# Patient Record
Sex: Female | Born: 1950
Health system: Southern US, Community
[De-identification: ages and names within clinical notes are randomized; demographics above are authoritative.]

## PROBLEM LIST (undated history)

## (undated) DIAGNOSIS — I10 Essential (primary) hypertension: Secondary | ICD-10-CM

## (undated) DIAGNOSIS — E669 Obesity, unspecified: Secondary | ICD-10-CM

## (undated) DIAGNOSIS — E119 Type 2 diabetes mellitus without complications: Secondary | ICD-10-CM

## (undated) DIAGNOSIS — H9190 Unspecified hearing loss, unspecified ear: Secondary | ICD-10-CM

## (undated) DIAGNOSIS — K219 Gastro-esophageal reflux disease without esophagitis: Secondary | ICD-10-CM

## (undated) DIAGNOSIS — G4730 Sleep apnea, unspecified: Secondary | ICD-10-CM

## (undated) DIAGNOSIS — K317 Polyp of stomach and duodenum: Secondary | ICD-10-CM

## (undated) DIAGNOSIS — D126 Benign neoplasm of colon, unspecified: Secondary | ICD-10-CM

## (undated) DIAGNOSIS — I2510 Atherosclerotic heart disease of native coronary artery without angina pectoris: Secondary | ICD-10-CM

## (undated) DIAGNOSIS — K648 Other hemorrhoids: Secondary | ICD-10-CM

## (undated) DIAGNOSIS — F32A Depression, unspecified: Secondary | ICD-10-CM

## (undated) DIAGNOSIS — D649 Anemia, unspecified: Secondary | ICD-10-CM

## (undated) DIAGNOSIS — K2270 Barrett's esophagus without dysplasia: Secondary | ICD-10-CM

## (undated) DIAGNOSIS — M4800 Spinal stenosis, site unspecified: Secondary | ICD-10-CM

## (undated) DIAGNOSIS — Z9289 Personal history of other medical treatment: Secondary | ICD-10-CM

## (undated) DIAGNOSIS — H269 Unspecified cataract: Secondary | ICD-10-CM

## (undated) DIAGNOSIS — I4891 Unspecified atrial fibrillation: Secondary | ICD-10-CM

## (undated) DIAGNOSIS — K635 Polyp of colon: Secondary | ICD-10-CM

## (undated) DIAGNOSIS — E785 Hyperlipidemia, unspecified: Secondary | ICD-10-CM

## (undated) DIAGNOSIS — T7840XA Allergy, unspecified, initial encounter: Secondary | ICD-10-CM

## (undated) DIAGNOSIS — K449 Diaphragmatic hernia without obstruction or gangrene: Secondary | ICD-10-CM

## (undated) DIAGNOSIS — K3189 Other diseases of stomach and duodenum: Secondary | ICD-10-CM

## (undated) DIAGNOSIS — K31A0 Gastric intestinal metaplasia, unspecified: Secondary | ICD-10-CM

## (undated) DIAGNOSIS — G4733 Obstructive sleep apnea (adult) (pediatric): Secondary | ICD-10-CM

## (undated) DIAGNOSIS — F419 Anxiety disorder, unspecified: Secondary | ICD-10-CM

## (undated) DIAGNOSIS — F329 Major depressive disorder, single episode, unspecified: Secondary | ICD-10-CM

## (undated) DIAGNOSIS — R7302 Impaired glucose tolerance (oral): Secondary | ICD-10-CM

## (undated) HISTORY — DX: Depression, unspecified: F32.A

## (undated) HISTORY — DX: Anemia, unspecified: D64.9

## (undated) HISTORY — DX: Impaired glucose tolerance (oral): R73.02

## (undated) HISTORY — DX: Unspecified cataract: H26.9

## (undated) HISTORY — DX: Unspecified atrial fibrillation: I48.91

## (undated) HISTORY — PX: TOTAL ABDOMINAL HYSTERECTOMY: SHX209

## (undated) HISTORY — DX: Anxiety disorder, unspecified: F41.9

## (undated) HISTORY — DX: Diaphragmatic hernia without obstruction or gangrene: K44.9

## (undated) HISTORY — DX: Allergy, unspecified, initial encounter: T78.40XA

## (undated) HISTORY — DX: Major depressive disorder, single episode, unspecified: F32.9

## (undated) HISTORY — DX: Obstructive sleep apnea (adult) (pediatric): G47.33

## (undated) HISTORY — DX: Polyp of colon: K63.5

## (undated) HISTORY — DX: Atherosclerotic heart disease of native coronary artery without angina pectoris: I25.10

## (undated) HISTORY — DX: Obesity, unspecified: E66.9

## (undated) HISTORY — DX: Hyperlipidemia, unspecified: E78.5

## (undated) HISTORY — DX: Personal history of other medical treatment: Z92.89

## (undated) HISTORY — DX: Gastro-esophageal reflux disease without esophagitis: K21.9

## (undated) HISTORY — DX: Polyp of stomach and duodenum: K31.7

## (undated) HISTORY — PX: CATARACT EXTRACTION: SUR2

## (undated) HISTORY — DX: Unspecified hearing loss, unspecified ear: H91.90

## (undated) HISTORY — DX: Other hemorrhoids: K64.8

## (undated) HISTORY — DX: Essential (primary) hypertension: I10

## (undated) HISTORY — DX: Type 2 diabetes mellitus without complications: E11.9

## (undated) HISTORY — DX: Benign neoplasm of colon, unspecified: D12.6

## (undated) HISTORY — DX: Gastric intestinal metaplasia, unspecified: K31.A0

## (undated) HISTORY — PX: GANGLION CYST EXCISION: SHX1691

## (undated) HISTORY — DX: Sleep apnea, unspecified: G47.30

---
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
## 1998-02-16 ENCOUNTER — Ambulatory Visit (HOSPITAL_COMMUNITY): Admission: RE | Admit: 1998-02-16 | Discharge: 1998-02-16 | Payer: Self-pay | Admitting: Internal Medicine

## 1998-08-23 ENCOUNTER — Other Ambulatory Visit: Admission: RE | Admit: 1998-08-23 | Discharge: 1998-08-23 | Payer: Self-pay | Admitting: Gynecology

## 1999-07-12 ENCOUNTER — Ambulatory Visit (HOSPITAL_COMMUNITY): Admission: RE | Admit: 1999-07-12 | Discharge: 1999-07-12 | Payer: Self-pay | Admitting: Obstetrics and Gynecology

## 1999-10-04 ENCOUNTER — Encounter: Payer: Self-pay | Admitting: Gynecology

## 1999-10-04 ENCOUNTER — Encounter: Admission: RE | Admit: 1999-10-04 | Discharge: 1999-10-04 | Payer: Self-pay | Admitting: Gynecology

## 1999-10-10 ENCOUNTER — Encounter: Admission: RE | Admit: 1999-10-10 | Discharge: 1999-10-10 | Payer: Self-pay | Admitting: Gynecology

## 1999-10-10 ENCOUNTER — Encounter: Payer: Self-pay | Admitting: Gynecology

## 1999-10-25 ENCOUNTER — Other Ambulatory Visit: Admission: RE | Admit: 1999-10-25 | Discharge: 1999-10-25 | Payer: Self-pay | Admitting: Gynecology

## 2000-10-16 ENCOUNTER — Encounter: Payer: Self-pay | Admitting: Gynecology

## 2000-10-16 ENCOUNTER — Ambulatory Visit (HOSPITAL_COMMUNITY): Admission: RE | Admit: 2000-10-16 | Discharge: 2000-10-16 | Payer: Self-pay | Admitting: Gynecology

## 2000-10-16 ENCOUNTER — Other Ambulatory Visit: Admission: RE | Admit: 2000-10-16 | Discharge: 2000-10-16 | Payer: Self-pay | Admitting: Gynecology

## 2001-05-27 ENCOUNTER — Encounter: Payer: Self-pay | Admitting: Emergency Medicine

## 2001-05-27 ENCOUNTER — Emergency Department (HOSPITAL_COMMUNITY): Admission: EM | Admit: 2001-05-27 | Discharge: 2001-05-27 | Payer: Self-pay | Admitting: Emergency Medicine

## 2001-05-31 ENCOUNTER — Encounter: Payer: Self-pay | Admitting: Internal Medicine

## 2001-05-31 ENCOUNTER — Encounter: Admission: RE | Admit: 2001-05-31 | Discharge: 2001-05-31 | Payer: Self-pay | Admitting: Internal Medicine

## 2001-10-18 ENCOUNTER — Other Ambulatory Visit: Admission: RE | Admit: 2001-10-18 | Discharge: 2001-10-18 | Payer: Self-pay | Admitting: Gynecology

## 2002-10-03 ENCOUNTER — Other Ambulatory Visit: Admission: RE | Admit: 2002-10-03 | Discharge: 2002-10-03 | Payer: Self-pay | Admitting: Gynecology

## 2003-01-24 ENCOUNTER — Encounter: Admission: RE | Admit: 2003-01-24 | Discharge: 2003-01-24 | Payer: Self-pay | Admitting: Internal Medicine

## 2003-01-24 ENCOUNTER — Encounter: Payer: Self-pay | Admitting: Internal Medicine

## 2005-07-30 ENCOUNTER — Ambulatory Visit (HOSPITAL_COMMUNITY): Admission: RE | Admit: 2005-07-30 | Discharge: 2005-07-30 | Payer: Self-pay | Admitting: Internal Medicine

## 2005-08-05 ENCOUNTER — Ambulatory Visit: Payer: Self-pay | Admitting: Cardiology

## 2005-08-07 ENCOUNTER — Inpatient Hospital Stay (HOSPITAL_BASED_OUTPATIENT_CLINIC_OR_DEPARTMENT_OTHER): Admission: RE | Admit: 2005-08-07 | Discharge: 2005-08-07 | Payer: Self-pay | Admitting: Cardiology

## 2005-08-07 ENCOUNTER — Ambulatory Visit: Payer: Self-pay | Admitting: Cardiology

## 2005-08-22 ENCOUNTER — Ambulatory Visit: Payer: Self-pay | Admitting: Cardiology

## 2006-02-24 ENCOUNTER — Encounter: Admission: RE | Admit: 2006-02-24 | Discharge: 2006-02-24 | Payer: Self-pay | Admitting: Internal Medicine

## 2007-05-12 ENCOUNTER — Other Ambulatory Visit: Admission: RE | Admit: 2007-05-12 | Discharge: 2007-05-12 | Payer: Self-pay | Admitting: Gynecology

## 2007-06-25 ENCOUNTER — Inpatient Hospital Stay (HOSPITAL_COMMUNITY): Admission: RE | Admit: 2007-06-25 | Discharge: 2007-06-27 | Payer: Self-pay | Admitting: Gynecology

## 2007-06-25 ENCOUNTER — Encounter: Payer: Self-pay | Admitting: Gynecology

## 2008-02-16 ENCOUNTER — Encounter: Admission: RE | Admit: 2008-02-16 | Discharge: 2008-02-16 | Payer: Self-pay | Admitting: Internal Medicine

## 2008-11-10 HISTORY — PX: WRIST FRACTURE SURGERY: SHX121

## 2008-11-13 ENCOUNTER — Emergency Department (HOSPITAL_COMMUNITY): Admission: EM | Admit: 2008-11-13 | Discharge: 2008-11-14 | Payer: Self-pay | Admitting: Emergency Medicine

## 2009-07-11 ENCOUNTER — Ambulatory Visit: Payer: Self-pay | Admitting: Cardiology

## 2009-07-11 ENCOUNTER — Inpatient Hospital Stay (HOSPITAL_COMMUNITY): Admission: EM | Admit: 2009-07-11 | Discharge: 2009-07-14 | Payer: Self-pay | Admitting: Emergency Medicine

## 2009-07-12 ENCOUNTER — Encounter (INDEPENDENT_AMBULATORY_CARE_PROVIDER_SITE_OTHER): Payer: Self-pay | Admitting: Emergency Medicine

## 2009-07-13 ENCOUNTER — Encounter: Payer: Self-pay | Admitting: Internal Medicine

## 2009-07-17 ENCOUNTER — Encounter: Payer: Self-pay | Admitting: Cardiology

## 2009-07-17 ENCOUNTER — Ambulatory Visit: Payer: Self-pay | Admitting: Internal Medicine

## 2009-07-17 LAB — CONVERTED CEMR LAB
INR: 3.6 — ABNORMAL HIGH (ref 0.8–1.0)
Prothrombin Time: 37.3 s — ABNORMAL HIGH (ref 9.1–11.7)

## 2009-07-24 ENCOUNTER — Ambulatory Visit: Payer: Self-pay | Admitting: Cardiology

## 2009-08-03 ENCOUNTER — Ambulatory Visit: Payer: Self-pay | Admitting: Internal Medicine

## 2009-08-03 DIAGNOSIS — G4733 Obstructive sleep apnea (adult) (pediatric): Secondary | ICD-10-CM

## 2009-08-03 DIAGNOSIS — I10 Essential (primary) hypertension: Secondary | ICD-10-CM

## 2009-08-03 DIAGNOSIS — K219 Gastro-esophageal reflux disease without esophagitis: Secondary | ICD-10-CM

## 2009-08-06 ENCOUNTER — Ambulatory Visit: Payer: Self-pay | Admitting: Internal Medicine

## 2009-08-06 DIAGNOSIS — E7800 Pure hypercholesterolemia, unspecified: Secondary | ICD-10-CM

## 2009-08-06 DIAGNOSIS — F17200 Nicotine dependence, unspecified, uncomplicated: Secondary | ICD-10-CM

## 2009-08-10 ENCOUNTER — Ambulatory Visit: Payer: Self-pay | Admitting: Internal Medicine

## 2009-08-28 LAB — CONVERTED CEMR LAB
ALT: 16 units/L (ref 0–35)
AST: 17 units/L (ref 0–37)
Alkaline Phosphatase: 91 units/L (ref 39–117)
Bilirubin, Direct: 0.1 mg/dL (ref 0.0–0.3)
Cholesterol: 184 mg/dL (ref 0–200)
Direct LDL: 117.4 mg/dL
HDL: 32.4 mg/dL — ABNORMAL LOW (ref >39.00)
Total Bilirubin: 0.7 mg/dL (ref 0.3–1.2)
Total CHOL/HDL Ratio: 6
Total Protein: 6.7 g/dL (ref 6.0–8.3)
Triglycerides: 244 mg/dL — ABNORMAL HIGH (ref 0.0–149.0)

## 2009-10-09 ENCOUNTER — Telehealth (INDEPENDENT_AMBULATORY_CARE_PROVIDER_SITE_OTHER): Payer: Self-pay | Admitting: *Deleted

## 2010-02-11 ENCOUNTER — Ambulatory Visit: Payer: Self-pay | Admitting: Internal Medicine

## 2010-02-11 DIAGNOSIS — I4891 Unspecified atrial fibrillation: Secondary | ICD-10-CM | POA: Insufficient documentation

## 2010-02-11 DIAGNOSIS — R0602 Shortness of breath: Secondary | ICD-10-CM

## 2010-03-04 ENCOUNTER — Telehealth: Payer: Self-pay | Admitting: Internal Medicine

## 2010-03-04 ENCOUNTER — Ambulatory Visit: Payer: Self-pay | Admitting: Internal Medicine

## 2010-03-05 ENCOUNTER — Encounter: Payer: Self-pay | Admitting: Internal Medicine

## 2010-03-05 LAB — CONVERTED CEMR LAB
BUN: 8 mg/dL (ref 6–23)
Creatinine, Ser: 0.7 mg/dL (ref 0.4–1.2)
GFR calc non Af Amer: 91.36 mL/min (ref >60)
Sodium: 139 meq/L (ref 135–145)

## 2010-04-02 ENCOUNTER — Encounter: Admission: RE | Admit: 2010-04-02 | Discharge: 2010-04-02 | Payer: Self-pay | Admitting: Internal Medicine

## 2010-05-03 ENCOUNTER — Ambulatory Visit: Payer: Self-pay | Admitting: Internal Medicine

## 2010-05-03 ENCOUNTER — Encounter: Payer: Self-pay | Admitting: Cardiology

## 2010-05-03 ENCOUNTER — Telehealth: Payer: Self-pay | Admitting: Internal Medicine

## 2010-05-22 ENCOUNTER — Ambulatory Visit: Payer: Self-pay | Admitting: Internal Medicine

## 2010-11-01 ENCOUNTER — Encounter: Payer: Self-pay | Admitting: Internal Medicine

## 2010-11-01 ENCOUNTER — Ambulatory Visit: Payer: Self-pay | Admitting: Internal Medicine

## 2010-11-07 ENCOUNTER — Telehealth: Payer: Self-pay | Admitting: Internal Medicine

## 2010-11-08 LAB — CONVERTED CEMR LAB
BUN: 16 mg/dL (ref 6–23)
CO2: 27 meq/L (ref 19–32)
Calcium: 8.9 mg/dL (ref 8.4–10.5)
Chloride: 102 meq/L (ref 96–112)
Glucose, Bld: 106 mg/dL — ABNORMAL HIGH (ref 70–99)
Sodium: 139 meq/L (ref 135–145)

## 2010-12-10 NOTE — Assessment & Plan Note (Signed)
Summary: f74m/jml   Visit Type:  Follow-up Primary Provider:  Shepard General MD   History of Present Illness: The patient presents today for routine electrophysiology followup. She reports doing well since last being seen in our office.  She has had no further palpitations.  Her BP remains elevated. She reports difficulty swallowing which is longstanding.  She has not seen Dr Chilton Si about this.   The patient denies any symptoms of chest pain,  orthopnea, PND, lower extremity edema, dizziness, presyncope, syncope, or neurologic sequela. The patient is tolerating medications without difficulties and is otherwise without complaint today.   Current Medications (verified): 1)  Bayer Aspirin 325 Mg Tabs (Aspirin) .... Take 1 Tablet By Mouth Once A Day 2)  Cardizem Cd 240 Mg Xr24h-Cap (Diltiazem Hcl Coated Beads) .Marland Kitchen.. 1 Once Daily 3)  Omeprazole 20 Mg Cpdr (Omeprazole) .... Two Times A Day 4)  Clonazepam 0.5 Mg Tabs (Clonazepam) .... One Tablet 3  Times A Day 5)  Simvastatin 40 Mg Tabs (Simvastatin) .... One By Mouth Qhs 6)  Hydrochlorothiazide 25 Mg Tabs (Hydrochlorothiazide) .... Take One Tablet Daily 7)  Fluoxetine Hcl 20 Mg Caps (Fluoxetine Hcl) .... Once Daily  Allergies: 1)  ! * Hydrocodone ?  Past History:  Past Medical History: Reviewed history from 08/06/2009 and no changes required. OSA compliant with CPAP Tobacco Use Persistent afib Obesity HL HTN GERD Cath 2006- nonobstructive CAD  Past Surgical History: Reviewed history from 08/06/2009 and no changes required. TAH C section  Social History: Reviewed history from 08/06/2009 and no changes required. She lives in [place] with her son.  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  She is a 15-pack-year smoker, negative for EtOH or drug use.   Review of Systems       All systems are reviewed and negative except as listed in the HPI.    Vital Signs:  Patient profile:   60 year old female Height:      66 inches Weight:      245 pounds BMI:     39.69 Pulse rate:   73 / minute BP sitting:   150 / 82  (left arm)  Vitals Entered By: Laurance Flatten CMA (May 22, 2010 11:17 AM)  Physical Exam  General:  obese, NAD Head:  normocephalic and atraumatic Eyes:  PERRLA/EOM intact; conjunctiva and lids normal. Mouth:  Teeth, gums and palate normal. Oral mucosa normal. Neck:  Neck supple, no JVD. No masses, thyromegaly or abnormal cervical nodes. Lungs:  Clear bilaterally to auscultation and percussion. Heart:  Non-displaced PMI, chest non-tender; regular rate and rhythm, S1, S2 without murmurs, rubs or gallops. Carotid upstroke normal, no bruit. Normal abdominal aortic size, no bruits. Femorals normal pulses, no bruits. Pedals normal pulses. No edema, no varicosities. Abdomen:  Bowel sounds positive; abdomen soft and non-tender without masses, organomegaly, or hernias noted. No hepatosplenomegaly. Msk:  Back normal, normal gait. Muscle strength and tone normal. Pulses:  pulses normal in all 4 extremities Extremities:  No clubbing or cyanosis. Neurologic:  Alert and oriented x 3. Skin:  Intact without lesions or rashes.   Impression & Recommendations:  Problem # 1:  ATRIAL FIBRILLATION (ICD-427.31)  We will continue cardizem for rate control.  We may need to add an antiarrhythmic if she has more afib. Her CHADS2 score is 1.  I have recommended either pradaxa or coumadin for stroke prevention, however, the patient  is clear in her decision to continue ASA 325mg  daily at this time.  Her updated medication list for this problem includes:    Bayer Aspirin 325 Mg Tabs (Aspirin) .Marland Kitchen... Take 1 tablet by mouth once a day  Problem # 2:  TOBACCO ABUSE (ICD-305.1) she quit smoking recently recent PFTs reveal mild obstruction cessation again encouraged  Problem # 3:  HYPERTENSION, UNSPECIFIED (ICD-401.9)  Her updated medication  list for this problem includes:    Bayer Aspirin 325 Mg Tabs (Aspirin) .Marland Kitchen... Take 1 tablet by mouth once a day    Cardizem Cd 240 Mg Xr24h-cap (Diltiazem hcl coated beads) .Marland Kitchen... 1 once daily    Hydrochlorothiazide 25 Mg Tabs (Hydrochlorothiazide) .Marland Kitchen... Take one tablet daily    Lisinopril 5 Mg Tabs (Lisinopril) ..... One by mouth once daily  Her updated medication list for this problem includes:    Bayer Aspirin 325 Mg Tabs (Aspirin) .Marland Kitchen... Take 1 tablet by mouth once a day    Cardizem Cd 240 Mg Xr24h-cap (Diltiazem hcl coated beads) .Marland Kitchen... 1 once daily    Hydrochlorothiazide 25 Mg Tabs (Hydrochlorothiazide) .Marland Kitchen... Take one tablet daily  Patient Instructions: 1)  Your physician recommends that you schedule a follow-up appointment in: 4 months with Dr Johney Frame 2)  Your physician has recommended you make the following change in your medication: start Lisinopril 5mg  daily  3)  Watch the Salt in diet  4)  Your physician recommends that you return for lab work in: 6 weeks with Dr Chilton Si (BMP) Prescriptions: LISINOPRIL 5 MG TABS (LISINOPRIL) one by mouth once daily  #30 x 11   Entered by:   Dennis Bast, RN, BSN   Authorized by:   Hillis Range, MD   Signed by:   Dennis Bast, RN, BSN on 05/22/2010   Method used:   [facility]        [facility]  [address] [facility])       [address]       [place], [place]  [address]       Ph: [phone] or [phone]       Fax: [phone]   RxID:   [id number]   Appended Document: f21m/jml Due to FDAs new advisory, we will decrease simvastatin to 10mg  daily as she is also on cardizem. She will need fasting lipids upon return.

## 2010-12-10 NOTE — Assessment & Plan Note (Signed)
Summary: decrease Simvastatin 10mg     Patient Instructions: 1)  Your physician recommends that you schedule a follow-up appointment in: 4 months with Dr Johney Frame 2)  Your physician has recommended you make the following change in your medication: start Lisinopril 5mg  daily and decrease Simvastatin to 10mg  daily 3)  Watch the Salt in diet 4)  Your physician recommends that you return for lab work in: 6 weeks with Dr Chilton Si (BMP)

## 2010-12-10 NOTE — Medication Information (Signed)
Summary: Work Note  Work Note   Imported By: Marylou Mccoy 05/24/2010 11:49:19  _____________________________________________________________________  External Attachment:    Type:   Image     Comment:   External Document

## 2010-12-10 NOTE — Progress Notes (Signed)
Summary: b/p issues, out of rhythm  Phone Note Call from Patient Call back at [facility] Phone [phone] Call back at .  ext 290 or 225 after 4 p.m   Caller: Patient Reason for Call: Talk to Nurse Summary of Call: per pt calling, c/o b/p today 168/131 hr 108. no chestpain, no sob. pt thinks she is out of rhythm.  Initial call taken by: Lorne Skeens,  May 03, 2010 3:11 PM  Follow-up for Phone Call        05/03/10 1530pm--pt states thinks she's gone back into a fib--no increase in SOB--no CP---dr Korrina Zern notified and wants pt  to come in for EKG--pt aware and will come in today--nt 05/03/10--1635pm--pt here for EKG to check for a fib--EKG today shows NSR with incomplete BBB--pt states her BP at home was 168/131--i got 156/62 with heart rate at 95bpm--she states she thinks her BP machine must be broken--no SOB or CP noted--note given for work tardiness,pt reassured and told to f/u on her sched appoint in mid july with dr Sayed Apostol--dr Tremel Setters aware of all the above--nt Follow-up by: Ledon Snare, RN,  May 03, 2010 4:42 PM

## 2010-12-10 NOTE — Assessment & Plan Note (Signed)
Summary: per check out/sf   Visit Type:  Follow-up Primary Provider:  Shepard General MD   History of Present Illness: The patient presents today for routine electrophysiology followup. She reports doing well since her recent hospital discharge. She has quit smoking since last being seen in our clinic.  She continues to have diffiuculty with SOB.  She also reports occasional palpitations and feels that she may have had 3 episodes of afib since last being seen in our clinic, lasting several hours each.  The patient denies any symptoms of chest pain,  orthopnea, PND, lower extremity edema, dizziness, presyncope, syncope, or neurologic sequela. The patient is tolerating medications without difficulties and is otherwise without complaint today.   Current Medications (verified): 1)  Bayer Aspirin 325 Mg Tabs (Aspirin) .... Take 1 Tablet By Mouth Once A Day 2)  Cardizem Cd 240 Mg Xr24h-Cap (Diltiazem Hcl Coated Beads) .Marland Kitchen.. 1 Once Daily 3)  Omeprazole 20 Mg Cpdr (Omeprazole) .Marland Kitchen.. 1 Once Daily 4)  Clonazepam 0.5 Mg Tabs (Clonazepam) .... One Tablet Two Times A Day 5)  Simvastatin 20 Mg Tabs (Simvastatin) .... Take One Tablet By Mouth Daily At Bedtime  Allergies: 1)  ! * Hydrocodone ?  Past History:  Past Medical History: Reviewed history from 08/06/2009 and no changes required. OSA compliant with CPAP Tobacco Use Persistent afib Obesity HL HTN GERD Cath 2006- nonobstructive CAD  Past Surgical History: Reviewed history from 08/06/2009 and no changes required. TAH C section  Social History: Reviewed history from 08/06/2009 and no changes required. She lives in [place] with her son.  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  She is a 15-pack-year smoker, negative for EtOH or drug use.   Review of Systems       All systems are reviewed and negative except as listed in the HPI.   Vital  Signs:  Patient profile:   60 year old female Height:      66 inches Weight:      238 pounds BMI:     38.55 Pulse rate:   80 / minute BP sitting:   142 / 70  Vitals Entered By: Laurance Flatten CMA (February 11, 2010 4:02 PM)  Physical Exam  General:  Well developed, well nourished, in no acute distress. Head:  normocephalic and atraumatic Eyes:  PERRLA/EOM intact; conjunctiva and lids normal. Mouth:  Teeth, gums and palate normal. Oral mucosa normal. Neck:  Neck supple, no JVD. No masses, thyromegaly or abnormal cervical nodes. Lungs:  Clear bilaterally to auscultation and percussion. Heart:  Non-displaced PMI, chest non-tender; regular rate and rhythm, S1, S2 without murmurs, rubs or gallops. Carotid upstroke normal, no bruit. Normal abdominal aortic size, no bruits. Femorals normal pulses, no bruits. Pedals normal pulses. No edema, no varicosities. Abdomen:  Bowel sounds positive; abdomen soft and non-tender without masses, organomegaly, or hernias noted. No hepatosplenomegaly. Msk:  Back normal, normal gait. Muscle strength and tone normal. Pulses:  pulses normal in all 4 extremities Extremities:  No clubbing or cyanosis. Neurologic:  Alert and oriented x 3.  CNII-XII intact, strength/sensation are intact Skin:  Intact without lesions or rashes.   EKG  Procedure date:  02/11/2010  Findings:      sinus rhythm 80 bpm, Incomplete RBBB, nonspecific ST/T changes  Impression & Recommendations:  Problem # 1:  SHORTNESS OF BREATH (ICD-786.05) stable without CHF by exam we will check PFTs  Problem # 2:  TOBACCO ABUSE (ICD-305.1) cessation again encouraged  Problem # 3:  ATRIAL FIBRILLATION (ICD-427.31) We will continue cardizem for rate control.  We may need to add an antiarrhythmic if she has more afib. Her CHADS2 score is 1.  I have recommended either pradaxa or coumadin for stroke prevention, however, the patient is clear in her decision to continue ASA 325mg  daily at this  time.  Problem # 4:  HYPERTENSION, UNSPECIFIED (ICD-401.9) above goal check BMET add HCTZ 25mg  daily today  Problem # 5:  HYPERCHOLESTEROLEMIA (ICD-272.0) recent elevated TGs. I have recommended weight loss and diet modification. WE will increase simvastatin to 40mg  daily today consider gemfibrazil if TGs remain elevated.  Other Orders: EKG w/ Interpretation (93000) TLB-BMP (Basic Metabolic Panel-BMET) (80048-METABOL) Misc. Referral (Misc. Ref)  Patient Instructions: 1)  Your physician recommends that you schedule a follow-up appointment in: 3 months 2)  Your physician recommends that you haver lab work today: bmet 3)  Your physician has recommended that you have a pulmonary function test.  Pulmonary Function Tests are a group of tests that measure how well air moves in and out of your lungs. 4)  Your physician has recommended you make the following change in your medication: simvastatin 40mg  a day.  Start HCTZ 25mg  daily Prescriptions: SIMVASTATIN 40 MG TABS (SIMVASTATIN) one by mouth qhs  #30 x 6   Entered by:   Lisabeth Devoid RN   Authorized by:   Hillis Range, MD   Signed by:   Lisabeth Devoid RN on 02/11/2010   Method used:   [facility]        [facility] [facility]       [address]       [place], [place]  [address]       Ph: [phone] or [phone]       Fax: [phone]   RxID:   [id number] CARDIZEM CD 240 MG XR24H-CAP (DILTIAZEM HCL COATED BEADS) 1 once daily  #30 x 6   Entered by:   Lisabeth Devoid RN   Authorized by:   Hillis Range, MD   Signed by:   Lisabeth Devoid RN on 02/11/2010   Method used:   [facility]        [facility] [facility]       [address]       [place], [place]  [address]       Ph: [phone] or [phone]       Fax: [phone]   RxID:   [id number] HYDROCHLOROTHIAZIDE 25 MG TABS (HYDROCHLOROTHIAZIDE) take one tablet daily  #30 x 6   Entered by:   Lisabeth Devoid RN   Authorized by:   Hillis Range, MD   Signed by:   Lisabeth Devoid RN on 02/11/2010   Method used:   [facility]        [facility]  [address] ([facility])       [address]       [place], [place]  [address]       Ph: [phone] or [phone]       Fax: [phone]   RxID:   [id number] SIMVASTATIN 40 MG TABS (SIMVASTATIN)   #30 x 6   Entered by:   Lisabeth Devoid RN   Authorized by:   Hillis Range, MD   Signed by:   Lisabeth Devoid RN on 02/11/2010   Method used:   Print then Give to Patient   RxID:  [id number]  

## 2010-12-10 NOTE — Progress Notes (Signed)
Summary: test results  Phone Note Call from Patient Call back at Home Phone [phone]   Caller: Patient Reason for Call: Talk to Nurse, Lab or Test Results Initial call taken by: Lorne Skeens,  March 04, 2010 12:19 PM  Follow-up for Phone Call        tried to call twice  no answer Dennis Bast, RN, BSN  March 04, 2010 3:03 PM pt aware  Dennis Bast, RN, BSN  March 05, 2010 10:24 AM    f

## 2010-12-10 NOTE — Miscellaneous (Signed)
Summary: Orders Update pft charges  Clinical Lists Changes  Orders: Added new Service order of Carbon Monoxide diffusing w/capacity (94720) - Signed Added new Service order of Lung Volumes (94240) - Signed Added new Service order of Spirometry (Pre & Post) (94060) - Signed 

## 2010-12-12 NOTE — Progress Notes (Signed)
Summary: returning call from yesterday  Phone Note Call from Patient Call back at Home Phone [phone]   Caller: Patient Reason for Call: Talk to Nurse Details for Reason: returning called from [facility] on yesterday. Summary of Call: tried to back  No answer Dennis Bast, RN, BSN  November 07, 2010 3:43 PM Initial call taken by: Lorne Skeens,  November 07, 2010 12:50 PM  Follow-up for Phone Call        adv pt of lab results.  Follow-up by: Claris Gladden RN,  November 08, 2010 9:46 AM

## 2010-12-12 NOTE — Assessment & Plan Note (Signed)
Summary: 4 month rov.sl   Visit Type:  Follow-up Primary Provider:  Shepard General MD   History of Present Illness: The patient presents today for routine electrophysiology followup. She reports doing well since last being seen in our office.  She has had no further palpitations and feels that her afib is controlled.  Her BP has improved.   She denies further symptoms of dysphagia.  She has not seen Dr Chilton Si about this.   The patient denies any symptoms of chest pain,  orthopnea, PND, lower extremity edema, dizziness, presyncope, syncope, or neurologic sequela. The patient is tolerating medications without difficulties and is otherwise without complaint today.   Current Medications (verified): 1)  Bayer Aspirin 325 Mg Tabs (Aspirin) .... Take 1 Tablet By Mouth Once A Day 2)  Cardizem Cd 240 Mg Xr24h-Cap (Diltiazem Hcl Coated Beads) .Marland Kitchen.. 1 Once Daily 3)  Omeprazole 20 Mg Cpdr (Omeprazole) .... Two Times A Day 4)  Clonazepam 0.5 Mg Tabs (Clonazepam) .... One Tablet 3  Times A Day 5)  Simvastatin 10 Mg Tabs (Simvastatin) .... One By Mouth Q Hs 6)  Hydrochlorothiazide 25 Mg Tabs (Hydrochlorothiazide) .... Take One Tablet Daily 7)  Fluoxetine Hcl 20 Mg Caps (Fluoxetine Hcl) .... Once Daily 8)  Lisinopril 5 Mg Tabs (Lisinopril) .... One By Mouth Once Daily 9)  Metformin Hcl 500 Mg Tabs (Metformin Hcl) .... Once Daily  Allergies: 1)  ! * Hydrocodone ?  Past History:  Past Medical History: Reviewed history from 08/06/2009 and no changes required. OSA compliant with CPAP Tobacco Use Persistent afib Obesity HL HTN GERD Cath 2006- nonobstructive CAD  Past Surgical History: Reviewed history from 08/06/2009 and no changes required. TAH C section  Social History: Reviewed history from 08/06/2009 and no changes required. She lives in [place] with her son.  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  She  is a 15-pack-year smoker, negative for EtOH or drug use.   Review of Systems       All systems are reviewed and negative except as listed in the HPI.   Vital Signs:  Patient profile:   60 year old female Height:      66 inches Weight:      224 pounds BMI:     36.29 Pulse rate:   57 / minute BP sitting:   110 / 70  (left arm)  Vitals Entered By: Laurance Flatten CMA (November 01, 2010 11:49 AM)  Physical Exam  General:  obese, NAD Head:  normocephalic and atraumatic Eyes:  PERRLA/EOM intact; conjunctiva and lids normal. Mouth:  Teeth, gums and palate normal. Oral mucosa normal. Neck:  Neck supple, no JVD. No masses, thyromegaly or abnormal cervical nodes. Lungs:  Clear bilaterally to auscultation and percussion. Heart:  Non-displaced PMI, chest non-tender; regular rate and rhythm, S1, S2 without murmurs, rubs or gallops. Carotid upstroke normal, no bruit. Normal abdominal aortic size, no bruits. Femorals normal pulses, no bruits. Pedals normal pulses. No edema, no varicosities. Abdomen:  Bowel sounds positive; abdomen soft and non-tender without masses, organomegaly, or hernias noted. No hepatosplenomegaly. Msk:  Back normal, normal gait. Muscle strength and tone normal. Extremities:  No clubbing or cyanosis. Neurologic:  Alert and oriented x 3.   EKG  Procedure date:  11/01/2010  Findings:      sinus bradycardia 57 bpm, PR 192, Qtc 461, incomplete RBBB, otherwise normal ekg  Impression & Recommendations:  Problem # 1:  ATRIAL FIBRILLATION (ICD-427.31) We will continue cardizem for rate control.  We may need to add an antiarrhythmic if she has more afib. Her CHADS2 score is 1.  I have recommended either xarelto or coumadin for stroke prevention, however, the patient is clear in her decision to continue ASA 325mg  daily at this time.  She has accepted a pamplet on xarelto today and will consider starting this medicine.  I have suggested xarelto over pradaxa due to her prior GI  difficulty (dysphagia/ dyspepsia) which may be further exacerbated by pradaxa. She will contact my office if she wishes to begin xarelto.  Her updated medication list for this problem includes:    Bayer Aspirin 325 Mg Tabs (Aspirin) .Marland Kitchen... Take 1 tablet by mouth once a day  Problem # 2:  TOBACCO ABUSE (ICD-305.1) cessation encouraged  Problem # 3:  HYPERTENSION, UNSPECIFIED (ICD-401.9) improved we will obtain a BMET  Problem # 4:  GERD (ICD-530.81) improved Her updated medication list for this problem includes:    Omeprazole 20 Mg Cpdr (Omeprazole) .Marland Kitchen..Marland Kitchen Two times a day  Problem # 5:  SHORTNESS OF BREATH (ICD-786.05) improved no changes  Problem # 6:  HYPERCHOLESTEROLEMIA (ICD-272.0) stable Her updated medication list for this problem includes:    Simvastatin 10 Mg Tabs (Simvastatin) ..... One by mouth q hs  Other Orders: T-Basic Metabolic Panel [phone])  Patient Instructions: 1)  Your physician wants you to follow-up in:  6 months with Dr Johney Frame Bonita Quin will receive a reminder letter in the mail two months in advance. If you don't receive a letter, please call our office to schedule the follow-up appointment. 2)  read about Xarelto

## 2011-02-14 LAB — CBC
HCT: 38.4 % (ref 36.0–46.0)
HCT: 41.2 % (ref 36.0–46.0)
HCT: 43.3 % (ref 36.0–46.0)
Hemoglobin: 13.8 g/dL (ref 12.0–15.0)
Hemoglobin: 14.8 g/dL (ref 12.0–15.0)
MCHC: 33.6 g/dL (ref 30.0–36.0)
MCHC: 34.2 g/dL (ref 30.0–36.0)
MCV: 91.8 fL (ref 78.0–100.0)
MCV: 92.5 fL (ref 78.0–100.0)
MCV: 93 fL (ref 78.0–100.0)
Platelets: 285 10*3/uL (ref 150–400)
Platelets: 293 10*3/uL (ref 150–400)
Platelets: 295 10*3/uL (ref 150–400)
RBC: 4.43 MIL/uL (ref 3.87–5.11)
RBC: 4.72 MIL/uL (ref 3.87–5.11)
RDW: 14 % (ref 11.5–15.5)
RDW: 14.3 % (ref 11.5–15.5)
WBC: 11 10*3/uL — ABNORMAL HIGH (ref 4.0–10.5)
WBC: 9.2 10*3/uL (ref 4.0–10.5)

## 2011-02-14 LAB — URINE MICROSCOPIC-ADD ON

## 2011-02-14 LAB — PROTIME-INR
INR: 0.9 (ref 0.00–1.49)
INR: 1 (ref 0.00–1.49)
INR: 1.5 (ref 0.00–1.49)
Prothrombin Time: 17.5 seconds — ABNORMAL HIGH (ref 11.6–15.2)
Prothrombin Time: 25 seconds — ABNORMAL HIGH (ref 11.6–15.2)

## 2011-02-14 LAB — BRAIN NATRIURETIC PEPTIDE: Pro B Natriuretic peptide (BNP): 102 pg/mL — ABNORMAL HIGH (ref 0.0–100.0)

## 2011-02-14 LAB — COMPREHENSIVE METABOLIC PANEL
AST: 20 U/L (ref 0–37)
Alkaline Phosphatase: 75 U/L (ref 39–117)
BUN: 12 mg/dL (ref 6–23)
CO2: 25 mEq/L (ref 19–32)
Calcium: 8.7 mg/dL (ref 8.4–10.5)
Chloride: 108 mEq/L (ref 96–112)
Creatinine, Ser: 0.83 mg/dL (ref 0.4–1.2)
GFR calc Af Amer: >60 mL/min (ref >60)
GFR calc non Af Amer: >60 mL/min (ref >60)
Sodium: 141 mEq/L (ref 135–145)

## 2011-02-14 LAB — CK TOTAL AND CKMB (NOT AT ARMC)
CK, MB: 0.6 ng/mL (ref 0.3–4.0)
Total CK: 20 U/L (ref 7–177)

## 2011-02-14 LAB — URINALYSIS, ROUTINE W REFLEX MICROSCOPIC
Nitrite: NEGATIVE
Urobilinogen, UA: 1 mg/dL (ref 0.0–1.0)
pH: 6.5 (ref 5.0–8.0)

## 2011-02-14 LAB — CARDIAC PANEL(CRET KIN+CKTOT+MB+TROPI)
CK, MB: 0.6 ng/mL (ref 0.3–4.0)
Total CK: 12 U/L (ref 7–177)

## 2011-02-14 LAB — DIFFERENTIAL
Basophils Relative: 0 % (ref 0–1)
Eosinophils Absolute: 0.3 10*3/uL (ref 0.0–0.7)
Eosinophils Relative: 3 % (ref 0–5)
Lymphs Abs: 2.7 10*3/uL (ref 0.7–4.0)
Monocytes Absolute: 0.6 10*3/uL (ref 0.1–1.0)
Neutro Abs: 7.1 10*3/uL (ref 1.7–7.7)
Neutrophils Relative %: 66 % (ref 43–77)

## 2011-02-14 LAB — POCT CARDIAC MARKERS
Myoglobin, poc: 37.9 ng/mL (ref 12–200)
Troponin i, poc: <0.05 ng/mL (ref 0.00–0.09)
Troponin i, poc: <0.05 ng/mL (ref 0.00–0.09)

## 2011-02-14 LAB — LIPID PANEL
HDL: 27 mg/dL — ABNORMAL LOW (ref >39)
LDL Cholesterol: 125 mg/dL — ABNORMAL HIGH (ref 0–99)
Total CHOL/HDL Ratio: 7.4 RATIO
Triglycerides: 233 mg/dL — ABNORMAL HIGH (ref <150)
VLDL: 47 mg/dL — ABNORMAL HIGH (ref 0–40)

## 2011-02-14 LAB — HEMOGLOBIN A1C: Mean Plasma Glucose: 111 mg/dL

## 2011-02-14 LAB — TSH: TSH: 1.087 u[IU]/mL (ref 0.350–4.500)

## 2011-02-14 LAB — TROPONIN I: Troponin I: 0.01 ng/mL (ref 0.00–0.06)

## 2011-02-14 LAB — URINE CULTURE: Colony Count: >=100000

## 2011-02-14 LAB — HEPARIN LEVEL (UNFRACTIONATED): Heparin Unfractionated: 1.13 IU/mL — ABNORMAL HIGH (ref 0.30–0.70)

## 2011-02-14 LAB — D-DIMER, QUANTITATIVE: D-Dimer, Quant: 0.3 ug/mL-FEU (ref 0.00–0.48)

## 2011-03-03 ENCOUNTER — Other Ambulatory Visit: Payer: Self-pay | Admitting: Internal Medicine

## 2011-03-08 ENCOUNTER — Other Ambulatory Visit: Payer: Self-pay | Admitting: Internal Medicine

## 2011-03-08 DIAGNOSIS — I4891 Unspecified atrial fibrillation: Secondary | ICD-10-CM

## 2011-03-08 DIAGNOSIS — I10 Essential (primary) hypertension: Secondary | ICD-10-CM

## 2011-03-25 NOTE — H&P (Signed)
Kayla Hendrix, Kayla Hendrix               ACCOUNT NO.:  [id number]   MEDICAL RECORD NO.:  [id number]          PATIENT TYPE:  INP   LOCATION:  2013                         FACILITY:  [facility]   PHYSICIAN:  Madolyn Frieze. Jens Som, MD, FACCDATE OF BIRTH:  [birth year]   DATE OF ADMISSION:  07/11/2009  DATE OF DISCHARGE:                              HISTORY & PHYSICAL   PRIMARY CARDIOLOGIST:  Arturo Morton. Riley Kill, MD, [facility] in remote past.   PRIMARY CARE PHYSICIAN:  Erskine Speed, MD   HISTORY OF PRESENT ILLNESS:  A 60 year old Caucasian female with history  of paroxysmal atrial fibrillation in the remote past who has not been  feeling well for the last 3 days.  She just had a tooth extraction  approximately 5 days ago and is under a lot of stress at work and she  felt that this was related to her symptoms.  She took her blood pressure  at home today and noticed that her heart rate is 130 and it was  irregular.  She called the primary care physician, Dr. Elmore Guise whose  office was closed this afternoon and she called our office who advised  her to come to the emergency room.  The patient was found to be in AFib  with RVR with heart rate of 140 beats per minute.  She was placed on  Cardizem drip at 10 mg an hour after 10 mg bolus.  The patient's heart  rate remains in AFib in 112-115 range.  She denies chest pain or  shortness of breath.  The patient cannot tell that she is in atrial  fibrillation.  At this time, her only complaints are fatigue.  The  patient has not been followed by Cardiology in several years.  She is  mostly followed by primary care physician and is on Cartia for  hypertension.   REVIEW OF SYSTEMS:  Positive for fatigue and irregular rhythm.  She  denies chest pain, shortness of breath, nausea, or vomiting.  All other  systems had been reviewed and found to be negative other than those  listed above.   PAST MEDICAL HISTORY:  1. Paroxysmal atrial fibrillation with a history of [facility]  cardioversion      and short-term Coumadin therapy, has not been followed by      Cardiology since that time.  2. Hypertension.  3. GERD.  4. Sleep apnea, but does not use CPAP.  The patient did undergo      cardiac catheterization in September 2006, secondary to abnormal      stress test, which revealed a calcification of the mid LAD without      critical stenosis, scattered luminal irregularities elsewhere.   PAST SURGICAL HISTORY:  Hysterectomy and a C-section.   SOCIAL HISTORY:  She lives in [place] with her son.  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 to 12:00 a.m. shift.  She is a 15-pack-year smoker,  negative for EtOH or drug use.   FAMILY HISTORY:  Non-insulin-dependent diabetes, hypertension.  Father  with cancer  and both parents are deceased.   CURRENT MEDICATIONS:  Prior to admission, Cartia XT 180 mg daily,  omeprazole 40 mg daily, clonazepam daily, aspirin 81 mg daily.   ALLERGIES:  No known drug allergies.   CURRENT LABORATORY DATA:  Sodium 141, potassium 3.6, chloride 108, CO2  of 25, BUN 12, creatinine 0.83, glucose 136, hemoglobin 14.8, hematocrit  43.3, white blood cells 10.7, platelets 295.  Sodium 141, potassium 3.6,  chloride 108, CO2 of 25, BUN 12, creatinine 0.83, glucose 136, troponin  less than 0.05 for point of care.  PTT 28, PT 12.2, INR 0.9.  EKG  revealing AFib with RVR rate of 142 beats per minute.  Chest x-ray  revealing low lung volumes with borderline cardiomegaly.   PHYSICAL EXAMINATION:  VITAL SIGNS:  Blood pressure 122/72, pulse 115,  respirations 18, temperature 98.1, O2 sat 99% on room air.  GENERAL:  She is awake, alert, oriented.  She does have a flat affect.  HEENT:  Head is normocephalic and atraumatic.  Eyes, PERRLA.  Mucous  membranes, mouth pink and moist.  Tongue is midline.  NECK:  Supple without bruits, there is some mild JVD noted.  CARDIOVASCULAR:  Irregular  rhythm, tachycardic without murmurs, rubs, or  gallops.  Pulses are 2+ and equal without bruits.  LUNGS:  Some scattered crackles, bibasilar, but she does have poor  inspiratory effort.  SKIN:  Warm and dry.  ABDOMEN:  Soft, nontender with 2+ bowel sounds.  EXTREMITIES:  Without clubbing, cyanosis, or edema.  NEUROLOGIC:  Cranial nerves II through XII are grossly intact.   IMPRESSION:  Atrial fibrillation, rapid ventricular response, unknown  how long she has had this rhythm.  Currently, on Cardizem drip.  We will  start heparin.  Check echocardiogram for LV function, add aspirin, and  need to evaluate further.  We will cycle cardiac enzymes, check TSH, D-  dimer, and smoking cessation is recommended.  The patient will be  started on p.o. Coumadin with consideration for [facility] cardioversion in a  later date at the discretion of Dr. Jens Som.  The patient's [place] score  is 1 if she converts on her own.  The patient will be continued on  aspirin only.  We will follow the patient throughout hospitalization  making further recommendations depending upon the patient's response to  his treatment.      Bettey Mare. Lyman Bishop, NP      Madolyn Frieze. Jens Som, MD, [facility]  Electronically Signed    KML/MEDQ  D:  07/11/2009  T:  07/12/2009  Job:  [id number]

## 2011-03-25 NOTE — H&P (Signed)
NAME:  Kayla Hendrix, Kayla Hendrix               ACCOUNT NO.:  [id number]   MEDICAL RECORD NO.:  [id number]          PATIENT TYPE:  AMB   LOCATION:  SDC                           [facility]ACILITY:  [facility]   PHYSICIAN:  Juan H. Lily Peer, M.D.DATE OF BIRTH:  03-Dec-1950   DATE OF ADMISSION:  DATE OF DISCHARGE:                              HISTORY & PHYSICAL   Patient scheduled for surgery on Friday, August 15, at 7:30 a.m. at  [facility].  Please have history and physical available.   CHIEF COMPLAINT:  Symptomatic leiomyomatous uteri.   HISTORY:  The patient is a 60 year old gravida 1, para 1, with history  of symptomatic leiomyomatous uteri and postmenopausal bleeding.  The  patient had been followed by Dr. Shepard General, who is her internist, who  recently had completed her medical evaluation and had cleared her for  her scheduled total abdominal hysterectomy and BSO, which is scheduled  for June 25, 2007.  Her recent Pap smear was normal.  She had  undergone through an endometrial biopsy, due to her postmenopausal  bleeding on July 2, with a benign findings.  The ultrasound done on July  2 demonstrated a retroverted uterus.  Endometrial cavity was displaced  by a calcified myoma, measured 68 x 82 x 87 mm.  Right and left ovaries  were normal.  Sonohystogram demonstrated endometrial cavity was filled  at the lower uterine segment, due to the pressure from the anterior  myoma against the wall of the cavity.   PAST MEDICAL HISTORY:  One c-section.  She has had a history of  hypertension and gastroesophageal reflux.   ALLERGIES:  SHE DENIES ANY ALLERGIES.   MEDICATIONS:  She takes aspirin, metoprolol, Prilosec, Cardia, and  Klonopin.   SOCIAL HISTORY:  She smokes half a pack of cigarettes per day.   PAST SURGICAL HISTORY:  Prior surgeries also included left ganglion cyst  of the wrist.   FAMILY HISTORY:  Mother non-insulin dependent diabetic.  Mother with  hypertension, an aunt with breast  cancer.  The patient's recent  mammogram several years ago, and still she has not followed up with a  mammogram.   PHYSICAL EXAMINATION:  GENERAL:  Well-developed, well-nourished female,  weighs 245 pounds, height 5 feet 6 inches tall, blood pressure 140/88.  HEENT:  Unremarkable.  NECK:  Supple, trachea midline, no carotid bruits, no thyromegaly.  LUNGS:  Clear to auscultation without any rhonchi's or wheezes.  HEART:  Regular rate and rhythm, no murmurs or gallops.  BREASTS:  Exam not done.  ABDOMEN:  Soft, nontender.  PELVIC:  Limited due to patient abdominal girth.  Irregularity due to  the fibroid was noted.  RECTAL:  Deferred.   ASSESSMENT:  A 60 year old gravida 1, para 1 previous cesarean section  with postmenopausal bleeding attributed to perhaps a fibroid uterus.  She has not been seen in the office prior to the recent visit, had been  5 years.  The patient is scheduled to undergo a total abdominal  hysterectomy with bilateral salpingo-oophorectomy.  She was cleared by  her internist, Dr. Chilton Si, recently.  The risks, benefits and  pros and  cons of the operation were discussed, including infection, bleeding or  trauma to internal organs.  In the event of technical difficulty or  trauma to internal organs, she may require corrective surgery at that  point.  In the event of uncontrollable hemorrhage, if she were to need  blood or blood products, she is fully aware of the risks involved from  transfusion, including anaphylactic reaction, hepatitis and AIDS.  Also,  we will place PSA stockings to prevent deep venous thrombosis and  pulmonary embolism, and she will receive prophylaxis antibiotics as  well.  All of these questions were answered and will follow accordingly.   PLAN:  The patient is scheduled for a total abdominal hysterectomy,  bilateral salpingo-oophorectomy on Friday, August 15th at [facility] at 7:30 a.m.  Please have history and physical  available.      Juan H. Lily Peer, M.D.  Electronically Signed     JHF/MEDQ  D:  06/23/2007  T:  06/23/2007  Job:  [id number]

## 2011-03-25 NOTE — Op Note (Signed)
Kayla Hendrix, Kayla Hendrix               ACCOUNT NO.:  [id number]   MEDICAL RECORD NO.:  [id number]          PATIENT TYPE:  INP   LOCATION:  9399                          FACILITY:  [facility]   PHYSICIAN:  Juan H. Lily Peer, M.D.DATE OF BIRTH:  [birth year]   DATE OF PROCEDURE:  06/25/2007  DATE OF DISCHARGE:                               OPERATIVE REPORT   PREOPERATIVE DIAGNOSES:  1. Leiomyomatous uteri.  2. Postmenopausal bleeding.   POSTOPERATIVE DIAGNOSES:  1. Leiomyomatous uteri.  2. Postmenopausal bleeding.   OPERATION PERFORMED:  1. Total abdominal hysterectomy.  2. Bilateral salpingo-oophorectomy.   SURGEON:  Juan H. Lily Peer, M.D.   FIRST ASSISTANT:  Daniel L. Eda Paschal, M.D.   ANESTHESIA:  General endotracheal anesthesia.   INDICATIONS FOR THE OPERATION:  This is a 56-year gravida 1, para 1 with  postmenopausal bleeding, symptomatic and leiomyomatous uteri.  Endometrial biopsy was benign.   FINDINGS:  An enlarged uterus, approximately 12 weeks size, globular in  appearance.  There appears to be a generalized and intramural myoma.  Normal-appearing ovaries and tubes.   DESCRIPTION OF THE OPERATION:  After the patient was adequately  counseled she was brought to the operating room where she underwent  successful general endotracheal anesthesia.  The patient's abdomen,  vagina and perineum were prepped and draped in usual sterile fashion.  Patient had PSA stockings for DVT prophylaxis and received a gram of  cefoxitin IV.   After the abdomen was prepped and draped in the sterile fashion a  Pfannenstiel incision was made approximately four fingerbreadths from  the symphysis pubis in a Pfannenstiel transverse fashion.  The  incision  was carried down through skin, subcutaneous tissue and down to the  rectus fascia.  A midline nick was made.  The fascia was incised in a  transverse fashion.  The peritoneal cavity was entered.   The patient was placed in the Trendelenburg  position.  The Lenox Ahr retractor was then placed.  The patient had some mild pelvic  adhesions that required adhesiolysis to free the omentum from the uterus  and the pelvis.  Once that was accomplished the O'Connor-O'Sullivan  retractions to be  placed.  Attention was placed to the right round  ligament, which was stitched with 0-Vicryl suture and then transected in  effort to build the bladder flap.  The posterior broad ligament was  identified as well as the ureter and with the surgeon's fingers  posterior broad ligament was penetrated.  The right infundibulopelvic  ligament was clamped away from the ureter and after it was cut it was  free-tied with 0-Vicryl followed by a transfixation stitch of 0-Vicryl  suture.  The rest of the broad and cardinal ligaments were serially  clamped, cut and suture ligated to the level of the right fornix.  A  similar procedure was carried out on the contralateral side.   After the uterine arteries had been clamped, cut and suture ligated the  uterus was amputated passed off the operative field to be able to  visualize the cervix and complete the operation.  After both sides of  the  cervix had been grasped with a Heaney clamp the remaining cervical  stump was excised and passed off the operative field.  Both angles were  secured with 0-Vicryl suture and the middle of the vaginal cuff was  secured with a figure-of-eight 0-Vicryl suture.   The pelvic cavity was then copiously irrigated with normal saline  solution.  There was some scattered oozing, which was cauterized and  following this Surgicel was placed for additional hemostasis.   Sponge count and needle count were correct.   The O'Connor-O'Sullivan retractor was removed and the visceral  peritoneum was not reapproximated, but the rectus fascia was closed with  running stitch of 0-Vicryl suture.  The subcutaneous bleeders were Bovie  cauterized.  The skin was reapproximated with  skin clips followed by the  placement of Xeroform gauze and four x eight dressing.   The patient was transferred to the recovery room with stable vital  signs.   ESTIMATED BLOOD LOSS:  Blood loss was 400 mL.   FLUIDS REPLACED AND URINE OUTPUT:  Intravenous fluids; 2700 mL and urine  output was 25 mL and a hematinic.      Juan H. Lily Peer, M.D.  Electronically Signed     JHF/MEDQ  D:  06/25/2007  T:  06/26/2007  Job:  [id number]

## 2011-03-25 NOTE — Op Note (Signed)
Kayla Hendrix, Kayla Hendrix               ACCOUNT NO.:  [id number]   MEDICAL RECORD NO.:  [id number]          PATIENT TYPE:  INP   LOCATION:  9304                          [facility]ILITY[facility]   PHYSICIAN:  Mark C. Vernie Ammons, M.D.  DATE OF BIRTH:  [birth year]   DATE OF PROCEDURE:  06/25/2007  DATE OF DISCHARGE:                               OPERATIVE REPORT   PREOPERATIVE DIAGNOSIS:  Intraperitoneal extravasation secondary to  bladder laceration.   POSTOPERATIVE DIAGNOSIS:  Intraperitoneal extravasation secondary to  bladder laceration.   PROCEDURE:  Abdominal exploration with closure of cystotomy.   SURGEON:  Mark C. Vernie Ammons, M.D.   ASSISTANT:  Rande Brunt. Eda Paschal, M.D.   ANESTHESIA:  General.   DRAINS:  16-French Foley catheter.   SPECIMENS:  None.   BLOOD LOSS:  Approximately 25 mL.   COMPLICATIONS:  None.   INDICATIONS:  The patient is a 60 year old white female patient of Dr.  Lily Peer who underwent TAH-BSO earlier this morning.  At the time of  surgery, the urine was almost clear and no injury to the bladder was  identified intraoperatively.  However, postoperatively, her urine began  to become bloody and remained bloody.  An IVP was therefore obtained,  and I reviewed this.  It revealed normal-appearing kidneys and ureters  passing down to the bladder.  The catheter was clamped, and as the  bladder filled, extravasation was noted into the peritoneal cavity  through the posterior wall of the bladder.   I discussed with the patient the fact that there was a bladder opening  that required surgical reexploration and closure.  I went over the  procedure with her as well as its risks and complications and need for  postoperative Foley catheter, and she understood and elected to proceed  with that surgery.   DESCRIPTION OF OPERATION:  After informed consent was obtained, the  patient was brought to the major OR and placed on the table and  administered general anesthesia.  Her  abdomen was sterilely prepped and  draped, and an official time-out was obtained.  Her skin staples were  then removed, and the anterior oblique fascia was opened by cutting the  sutures that were used for prior closure.  The rectus muscle bellies  were parted in the midline, and then the abdomen was entered.  Some thin  bloody fluid was identified and drained.  The intestinal contents were  then packed in the upper abdomen, and Bookwalter retractor was placed to  afford exposure of the bladder.   Palpation revealed an opening in the bladder approximately 2.5 cm in  length.  This was noted to be well away from the ureteral orifices on  previous IVP.  I therefore closed the opening with running 2-0 chromic  suture, closing both the mucosa and muscularis.  A second imbricating 2-  0 chromic suture was then used for a second layer of closure.  I then  tested the bladder by instilling sterile saline through the Foley  catheter and could see a small area of extravasation, so I reinforced  this by oversewing this with 2-0 chromic  suture.  I then retested by  filling the bladder again, and no leakage was identified.   The left pedicle was noted to be bleeding slightly as well as the  posterior cuff region.  Two-0 chromic sutures were then used to oversew  these with good control of the minor oozing that was identified.  Surgicel was then placed over this incision, and the packing was removed  as well as the retractor.  The abdominal contents were allowed to then  return to their normal anatomic position, and closure was then  performed.   The anterior oblique fascia was then closed with #1 Vicryl suture in a  running fashion.  The subcutaneous tissue was copiously irrigated with  saline, and the skin was closed with skin staples.  The patient was  awakened and taken to the recovery room in stable satisfactory  condition.  She tolerated procedure well.  There were no intraoperative   complications.  Needle, sponge and instrument counts were correct x2 at  the end of the operation.      Mark C. Vernie Ammons, M.D.  Electronically Signed     MCO/MEDQ  D:  06/25/2007  T:  06/26/2007  Job:  [id number]

## 2011-03-25 NOTE — Consult Note (Signed)
Kayla Hendrix, Kayla Hendrix               ACCOUNT NO.:  [id number]   MEDICAL RECORD NO.:  [id number]          PATIENT TYPE:  EMS   LOCATION:  ED                           [facility]   PHYSICIAN:  Artist Pais. Mina Marble, M.D.DATE OF BIRTH:  30-Jan-1951   DATE OF CONSULTATION:  11/14/2008  DATE OF DISCHARGE:                                 CONSULTATION   [facility] Emergency Room.   PHYSICIAN REQUESTING CONSULTATION:  Elliot L. Effie Shy, M.D.   REASON FOR CONSULTATION:  Kayla Hendrix is a 60 year old right-hand-  dominant female who fell earlier today and presents today with a  displaced fracture of the distal radius and ulnar styloid fracture,  dominant right wrist.  She is 60 years old.  She has no known drug  allergies.  She is currently taking omeprazole, clonazepam and diltiazem  on a daily basis.  No recent hospitalizations or surgery.   FAMILY MEDICAL HISTORY:  Noncontributory.   SOCIAL HISTORY:  Noncontributory.   EXAM:  This is a well-nourished female, pleasant, alert and oriented x3.  Examination of her wrist and hand on the left:  She has obvious pain and  swelling and she can move her fingers although she has some discomfort.  Denies any significant numbness or tingling, although she says she has  intermittent median symptoms over the past 1/2 hour since she has been  here in the emergency room.  Her skin is intact with no open wounds, 2+  radial pulse, brisk capillary refill, but again pain and swelling of the  distal radius area, dominant right wrist.   X-rays show an intra-articular fracture with angulation dorsally and a  nondisplaced ulnar styloid fracture.   IMPRESSION:  A 60-year female with a displaced intra-arterial fracture  distal radius, dominant right side, nondisplaced ulnar styloid fracture.  The patient is given 2% plain lidocaine hematoma block and was placed in  finger trap traction.  Closed reduction was performed, placed in a well-  padded  sugar-tong splint.  Postreduction films showed adequate  reduction.  I had a thorough and frank discussion with Kayla Hendrix and  her son regarding treatment options.  She has an intra-articular  component to this fracture and is unstable.  Therefore I recommend plate  fixation and will see  her in my office later this morning, November 14, 2108, for discussions regarding open reduction and internal fixation of  this right distal radius fracture.  She was given Percocet for pain and  sling, instructions in cast care and compartment syndrome, and told to  call my office if there is any significant increased pain, swelling,  etc., if not again will see her later today in my office, November 14, 2008.      Artist Pais Mina Marble, M.D.  Electronically Signed     MAW/MEDQ  D:  11/14/2008  T:  11/14/2008  Job:  [id number]

## 2011-03-28 NOTE — Cardiovascular Report (Signed)
Kayla Hendrix, GOMM               ACCOUNT NO.:  [id number]   MEDICAL RECORD NO.:  [id number]          PATIENT TYPE:  OIB   LOCATION:  1962                         FACILITY:  [facility]   PHYSICIAN:  Arturo Morton. Riley Kill, M.D. [facility] OF BIRTH:  Mar 22, 1951   DATE OF PROCEDURE:  08/07/2005  DATE OF DISCHARGE:                              CARDIAC CATHETERIZATION   INDICATIONS:  Ms. Klecka is a 60 year old who has had a recent episode of  severe substernal chest pain. She had a mildly abnormal exercise tolerance  test, she has multiple cardiac risk factors including history of prior  paroxysmal atrial fibrillation, hypertension, a smoking history and a  potentially positive family history. The patient also has significant  elevation of her LDL and a borderline low HDL for females.  Risks, benefits  and alternatives were discussed with the patient and she was brought to the  catheterization laboratory for further evaluation.   PROCEDURES:  1.  Left heart catheterization.  2.  Selective coronary arteriography.  3.  Selective left ventriculography.   DESCRIPTION OF PROCEDURE:  The patient was brought to the cath lab and  prepped and draped in the usual fashion.  Through an anterior puncture, the  right femoral artery was entered on a front wall puncture, a 4-French sheath  was placed.  Views of the left and right coronary arteries were obtained in  multiple angiographic projections. Central aortic and left ventricular  pressures were measured with pigtail. Ventriculography was performed in the  RAO projection using 22 mL of contrast at a flow rate of 11 mL per second.  She tolerated procedure well and there were no complications. She was taken  to the holding area in satisfactory clinical condition. I did review the  films with her in the catheterization suite.   HEMODYNAMIC DATA:  1.  Central aortic pressure 144/80 with a mean of 107.  2.  Left ventricular pressure 146/17.  3.  No gradient  on pullback across the aortic valve.   ANGIOGRAPHIC DATA:  1.  Ventriculography was done in the RAO projection. Overall systolic      function was preserved. No definite wall motion abnormalities were seen.  2.  The left main coronary artery was free of critical disease.  It was a      large-caliber vessel that divided into an LAD and circumflex system.  3.  The circumflex coronary artery provides a predominately large single      marginal system. There are two tiny first and second marginal branches      that are insignificant vessels.  The third marginal branch is a large      caliber vessel that courses out to the apex. There are mild luminal      irregularities in this distal vessel.  No critical areas of stenosis are      noted.  4.  The left anterior descending artery courses to the apex.  There is      calcification in the mid vessel noted on multiple views.  After the      first and second diagonal takeoffs, there  is no more than about 30%      narrowing in this area of segmental calcification.  The distal LAD is      without critical stenosis.  5.  The right coronary artery is a dominant vessel providing a posterior      descending and three posterolateral branches. There is no damping of the      vessel on initial injection.  The PDA and the posterolateral system all      appear to be free of critical disease.  Mild tapering at the ostium      cannot be excluded but it is not hemodynamically significant.   CONCLUSION:  1.  Well-preserved overall left ventricular function.  2.  Calcification of mid left anterior descending artery without critical      stenosis.  3.  Scattered luminal irregularities throughout the coronary system.   RECOMMENDATIONS:  Aggressive risk factor reduction which would include lipid  lowering, discontinuation of smoking and weight loss as well as regular  exercise. Follow-up will be with Dr. Chilton Si.      Arturo Morton. Riley Kill, M.D. [facility]   Electronically Signed     TDS/MEDQ  D:  08/07/2005  T:  08/07/2005  Job:  [id number]   cc:   Erskine Speed, M.D.  Fax: [phone]   [facility] Lab

## 2011-03-28 NOTE — Discharge Summary (Signed)
Kayla Hendrix, Kayla Hendrix               ACCOUNT NO.:  [id number]   MEDICAL RECORD NO.:  [id number]          PATIENT TYPE:  INP   LOCATION:  9304                          FACILITY:  [facility]   PHYSICIAN:  Juan H. Lily Peer, M.D.DATE OF BIRTH:  May 11, 1951   DATE OF ADMISSION:  06/25/2007  DATE OF DISCHARGE:  06/27/2007                               DISCHARGE SUMMARY   HISTORY:  The patient is a 60 year old gravida 1, para 1, with history  of symptomatic leiomyomatous uteri and postmenopausal bleeding.  She has  undergone a TAH/BSO on June 25, 2007.  In the recovery room, it was  evident that the patient had dark gross hematuria and for a few hours  later since the urine did not clear she was taken for IVP that  demonstrated normal-appearing kidneys and ureters, passing down to the  bladder.  The catheter had been clamped and the bladder filled.  Extravasation was noted in the peritoneal cavity to the posterior wall  of the bladder and Dr. Barron Alvine, the urologist had been contact and  the patient was then taken back to the operating room few hours later  where she had gone an exploration with closure of cystotomy.  On  postoperative day 1, the patient was afebrile.  Urine began to clear.  She had not passed flatus.  She had the Foley catheter been left in  place, and she had been kept on prophylaxis antibiotics of 1 g Cefotan  q.12h.  The patient was unable to be weaned off the O2.  Her O2  saturations dropped when she was laid supine, but being above 90 on  sitting.  She had some mild chest discomfort and obtained a CT of the  chest had been completed, which reported to be negative.  Her  electrolytes were normal, and she was continued to advance some clear  liquid to regular diet, continue to ambulate, and was kept on nasal O2  of 2 L.  By third postoperative day, she was up and ambulating,  tolerating regular diet well, her urine was clear.  She was given  instruction on Foley and leg bag  maintenance.  The patient with history  of hypertension was started on medications  .  The Lopressor was held  off.  She was to stay on her Cartia XT 180 mg daily as well as her  clonazepam.   FINAL DIAGNOSES:  1. Leiomyomatous uteri.  2. Postmenopausal bleeding.  3. Incidental cystotomy.  4. Hypertension.  5. Gastroesophageal reflux.   PROCEDURE PERFORMED:  1. Total abdominal hysterectomy/bilateral salpingo-oophorectomy.  2. Repair of incidental cystotomy.  3. Intravenous pyelogram.  4. CT of the chest.   FINAL DISPOSITION/FOLLOW UP:  The patient was discharged home on her  third postoperative day.  She was given instructions on leg bag  maintenance.  She will be kept on it for at least 1 more week, and she  will follow up with the urologist.  At that point, she will be kept on  Cipro 250 mg b.i.d. for a 7-day course and also Pyridium 200  mg q.4h. p.r.n. for  bladder spasms.  She was given Lortab 7.5/500 mg to  take 1 p.o. q.4-6h. p.r.n. pain and Reglan 10 mg 1 p.o. q.4-6h. p.r.n.  for nausea and vomiting.  She will hold on her Lopressor and will stay  on her Cartia XT 180 mg daily.  The patient will follow up with me in 2  weeks for postoperative visit.      Juan H. Lily Peer, M.D.  Electronically Signed     JHF/MEDQ  D:  07/26/2007  T:  07/26/2007  Job:  [id number]

## 2011-03-28 NOTE — H&P (Signed)
Kayla Hendrix, Kayla Hendrix               ACCOUNT NO.:  [id number]   MEDICAL RECORD NO.:  [id number]          PATIENT TYPE:  OIB   LOCATION:  1962                         FACILITY:  [facility]   PHYSICIAN:  Arturo Morton. Riley Kill, M.D. [facility] OF BIRTH:  Sep 07, 1951   DATE OF ADMISSION:  08/07/2005  DATE OF DISCHARGE:                                HISTORY & PHYSICAL   CHIEF COMPLAINT:  Chest pain.   HISTORY OF PRESENT ILLNESS:  The patient is a 60 year old patient of Dr. Elmore Guise.  She recently following dinner began to hurt in the mid chest.  It  lasted for 45 minutes and it was severe.  She became hot and this radiated  up into the shoulders.  Her son wanted her to go to the emergency room but  she declined.  She was subsequently seen in follow-up by Dr. Elmore Guise.  Her  EKG revealed no acute changes.  She subsequently underwent exercise  tolerance testing where she had some ST depression in V4.  She had no chest  pain.  The changes resolved promptly.  Dr. Chilton Si recommended that she  undergo cardiac catheterization and she is being referred for that  particular purpose.   PAST MEDICAL HISTORY:  The patient had hand surgery in the 1970s.  She had a  cesarean section in 1981 and in 1999 had paroxysmal atrial fibrillation  during which she was seen by Dr. Myrtis Ser.  She was briefly on Coumadin at that  time.   She has no known allergies.   She smokes one and a half to one pack per day.   MEDICATIONS:  1.  Cartia XT 180 mg daily.  2.  Omeprazole 20 mg daily.  3.  Metoprolol 50 mg one-half tablet p.o. b.i.d.  4.  Clonazepam 0.5 mg p.o. b.i.d.  5.  Aspirin 325 mg daily.   SOCIAL HISTORY:  The patient works as a Armed forces technical officer.  She reads.  She does not exercise on a regular basis.  She is single and has a son 20  years of age.  She smokes a half a pack to a pack of cigarettes daily.   FAMILY HISTORY:  Father who died at 56 of stomach cancer and a mother who is  35 years old whose heart  stopped.  She has two sisters and two brothers  ranging in age from 97 to 31 years all of whom who are in good health.   REVIEW OF SYSTEMS:  Remarkable for some chest discomfort which she thinks  might be related to reflux.  Patient has hypertension and occasional cough.  She does smoke.   PHYSICAL EXAMINATION:  VITAL SIGNS:  She is 5 feet 6 inches tall, weight 230  pounds, blood pressure 140/84, pulse 74.  NECK:  No jugular venous distention.  LUNGS:  Clear to auscultation, percussion.  HEART:  PMI nondisplaced.  Normal S1 and S2.  No murmurs, rubs, or gallops.  ABDOMEN:  Soft without hepatosplenomegaly.  EXTREMITIES:  Femoral pulses are intact without bruits.  Distal pulses are  intact.  NEUROLOGIC:  Nonfocal.   Chest  x-ray done in the office has been sent out for formal review.  The  formal review is pending.  No acute changes were noted on the chest x-ray.   Electrocardiogram done in the office reveals normal sinus rhythm, within  normal limits.   IMPRESSION:  1.  Recent episode of severe chest pain with multiple cardiac risk factors.  2.  History of paroxysmal atrial fibrillation.  3.  History of hypertension.  4.  History of tobacco use.  5.  Hypercholesterolemia identified in Dr. Thomasene Lot office.   PLAN:  We have discussed the various options with the patient.  She wants a  definitive answer.  Risks, benefits, and alternatives have been discussed.  Dr. Chilton Si has recommended outpatient cardiac catheterization which we have  arranged.  The patient is agreeable to proceed.      Arturo Morton. Riley Kill, M.D. [facility]  Electronically Signed     TDS/MEDQ  D:  08/07/2005  T:  08/07/2005  Job:  [id number]

## 2011-04-06 IMAGING — CR DG CHEST 1V PORT
1 series · 1 of 1 positions shown · non-contrast
Comparison: Chest x-ray from 06/23/2007.

CLINICAL DATA: Palpitations, history of smoking.

PORTABLE CHEST - 1 VIEW

[AP]
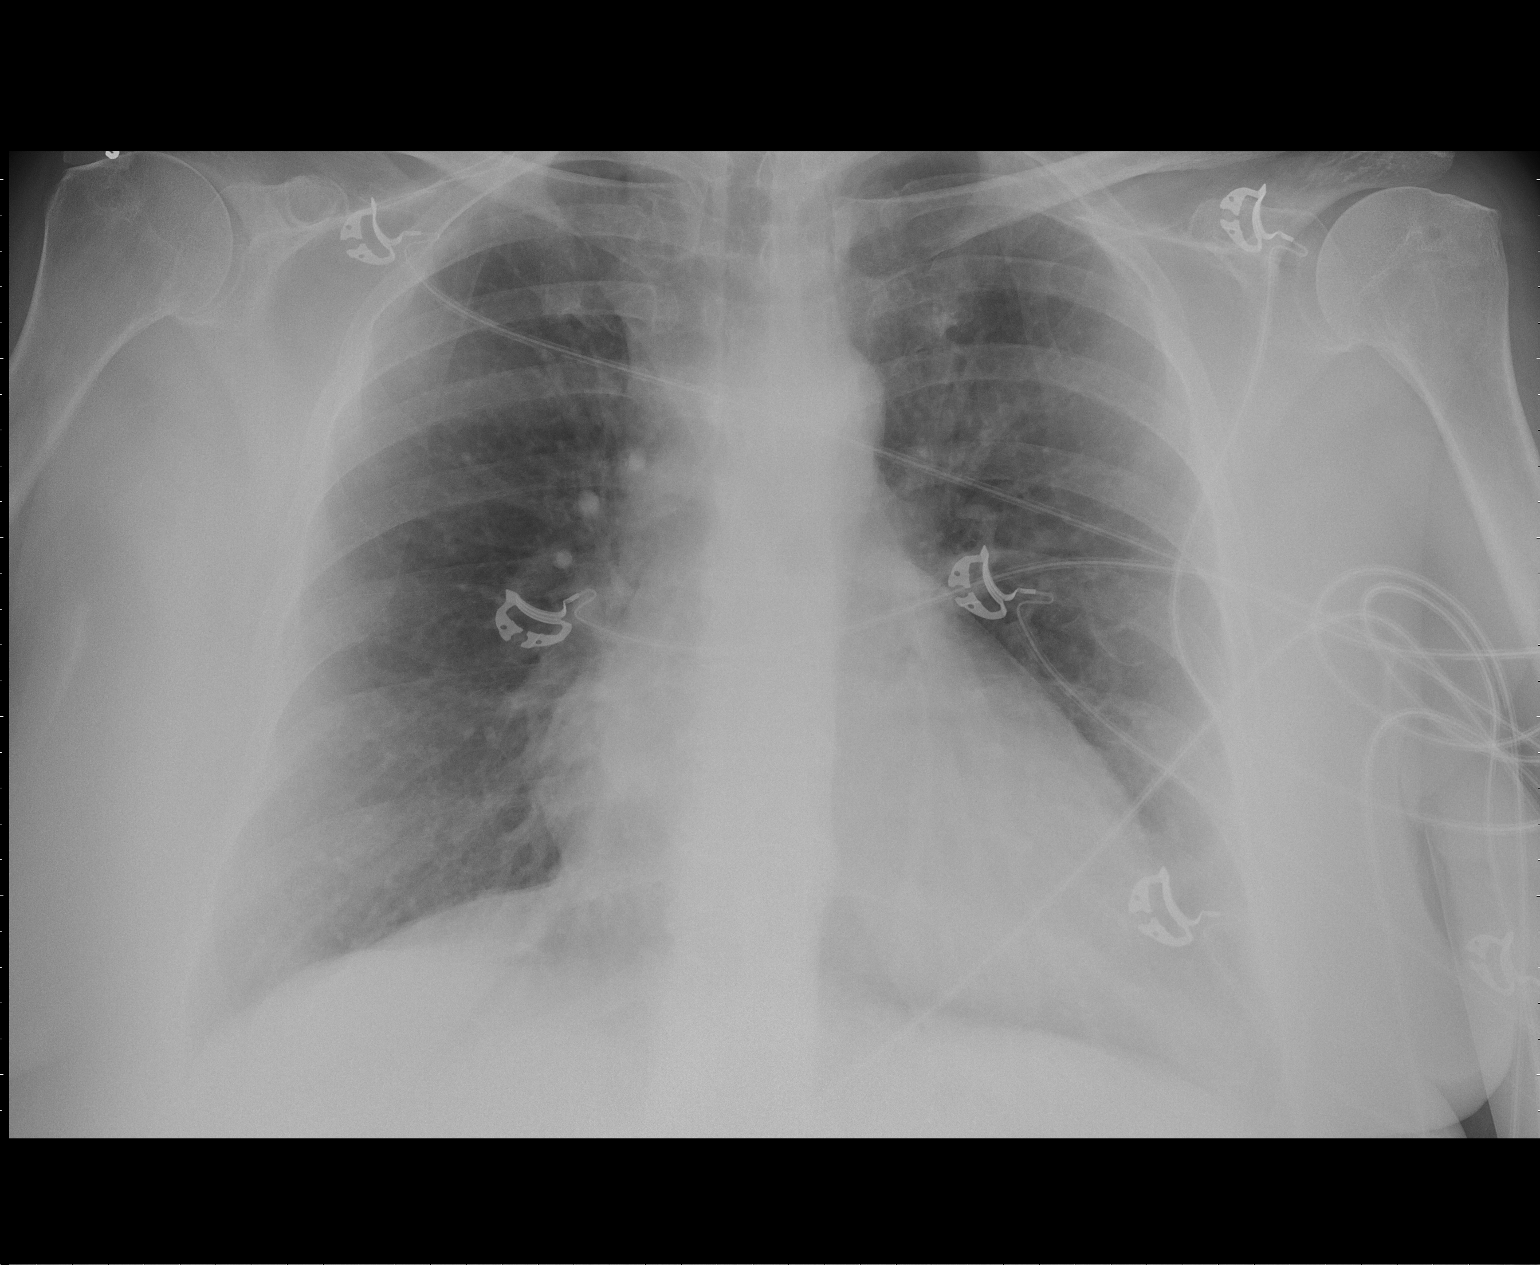

[1 of 1 positions shown; findings below may reference images not displayed]

FINDINGS: Heart size upper limits normal.  Mild vascular congestion
but no infiltrates or failure.  No effusion or pneumothorax.  Low
lung volumes.  No acute bony abnormality.
IMPRESSION: Low lung volumes with borderline cardiomegaly.  No acute
infiltrates.

## 2011-04-14 ENCOUNTER — Encounter: Payer: Self-pay | Admitting: Internal Medicine

## 2011-05-05 ENCOUNTER — Ambulatory Visit (INDEPENDENT_AMBULATORY_CARE_PROVIDER_SITE_OTHER): Payer: 59 | Admitting: Internal Medicine

## 2011-05-05 ENCOUNTER — Encounter: Payer: Self-pay | Admitting: Internal Medicine

## 2011-05-05 DIAGNOSIS — I4891 Unspecified atrial fibrillation: Secondary | ICD-10-CM

## 2011-05-05 DIAGNOSIS — G4733 Obstructive sleep apnea (adult) (pediatric): Secondary | ICD-10-CM

## 2011-05-05 DIAGNOSIS — I10 Essential (primary) hypertension: Secondary | ICD-10-CM

## 2011-05-05 NOTE — Assessment & Plan Note (Signed)
She has daytime somnolence and has been noncompliant with CPAP I will refer her to Dr Shelle Iron to assist with adjustment of CPAP.

## 2011-05-05 NOTE — Assessment & Plan Note (Signed)
Doing well of AAD Continue current medical therapy Her CHADS2 score is 2 (glucose intolerance and HTN).  She wishes to continue ASA at this time.  I have informed her that AHA/ ACC recommendations are for coumadin or pradaxa.  She will discuss this further with Dr Chilton Si.  Given prior GERD, I would favor Xarelto for her.

## 2011-05-05 NOTE — Patient Instructions (Signed)
Your physician wants you to follow-up in: 12 months with Dr Jacquiline Doe will receive a reminder letter in the mail two months in advance. If you don't receive a letter, please call our office to schedule the follow-up appointment.  You have been referred to Dr Shelle Iron for management of CPAP has sleep apnea

## 2011-05-05 NOTE — Assessment & Plan Note (Signed)
Stable No change required today  

## 2011-05-05 NOTE — Progress Notes (Signed)
The patient presents today for routine electrophysiology followup.  Since last being seen in our clinic, the patient reports doing reasonably well.   She reports only one episode of afib since her last visit to our clinic, lasting only 2-3 hours.  Her primary concern today is with daytime fatigue and feeling "sleepy".  She does not feel well rested upon waking.  She has been noncompliant with CPAP and requests to see [facility] for assistance with CPAP management. Today, she denies symptoms of palpitations, chest pain, shortness of breath (above baseline), orthopnea, PND, lower extremity edema, dizziness, presyncope, syncope, or neurologic sequela.  The patient feels that she is tolerating medications without difficulties and is otherwise without complaint today.   Past Medical History  Diagnosis Date  . OSA (obstructive sleep apnea)     noncompliant with CPAP  . Glucose intolerance (impaired glucose tolerance)   . Atrial fibrillation     persistant  . Obesity   . HL (hearing loss)   . HTN (hypertension)   . GERD (gastroesophageal reflux disease)   . CAD (coronary artery disease)     cath 2006- nonobstructive CAD   Past Surgical History  Procedure Date  . Total abdominal hysterectomy   . Cesarean section     Current Outpatient Prescriptions  Medication Sig Dispense Refill  . aspirin 325 MG tablet Take 325 mg by mouth daily.        . clonazePAM (KLONOPIN) 0.5 MG tablet Take 0.5 mg by mouth 3 (three) times daily.        Marland Kitchen diltiazem (CARDIZEM CD) 240 MG 24 hr capsule TAKE 1 CAPSULE BY MOUTH ONCE A DAY  30 capsule  5  . FLUoxetine (PROZAC) 20 MG capsule Take 20 mg by mouth daily.        . hydrochlorothiazide 25 MG tablet TAKE 1 TABLET BY MOUTH ONCE A DAY  30 tablet  5  . lisinopril (PRINIVIL,ZESTRIL) 5 MG tablet Take 5 mg by mouth daily.        . metFORMIN (GLUCOPHAGE) 500 MG tablet Take 500 mg by mouth daily.        Marland Kitchen omeprazole (PRILOSEC) 20 MG capsule Take 20 mg by mouth 2  (two) times daily.        . simvastatin (ZOCOR) 10 MG tablet Take 10 mg by mouth daily.          No Known Allergies  History   Social History  . Marital Status: Single    Spouse Name: N/A    Number of Children: N/A  . Years of Education: N/A   Occupational History  . Not on file.   Social History Main Topics  . Smoking status: Former Games developer  . Smokeless tobacco: Never Used   Comment: 15-pack-year smoker  . Alcohol Use: No  . Drug Use: No  . Sexually Active: Not on file   Other Topics Concern  . Not on file   Social History Narrative   Pt. Lives in [place] with her son. 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     Family History  Problem Relation Age of Onset  . Diabetes      noninsulin dependant DM  . Hypertension    . Cancer Father     ROS-  All systems are reviewed and are negative except as outlined in the HPI above  Physical Exam: Filed Vitals:   05/05/11 1152  BP: 104/66  Pulse: 75  Resp: 14  Height: 5\' 6"  (1.676 m)  Weight: 204 lb (92.534 kg)    GEN- The patient is well appearing, alert and oriented x 3 today.   Head- normocephalic, atraumatic Eyes-  Sclera clear, conjunctiva pink Ears- hearing intact Oropharynx- clear Neck- supple, no JVP Lymph- no cervical lymphadenopathy Lungs- Clear to ausculation bilaterally, normal work of breathing Heart- Regular rate and rhythm, no murmurs, rubs or gallops, PMI not laterally displaced GI- soft, NT, ND, + BS Extremities- no clubbing, cyanosis, or edema MS- no significant deformity or atrophy Skin- no rash or lesion Psych- euthymic mood, full affect Neuro- strength and sensation are intact  ekg today reveals sinus rhythm 75 bpm, incomplete RBBB, nonspecific ST/T changes, low voltage  Assessment and Plan:

## 2011-05-09 ENCOUNTER — Other Ambulatory Visit: Payer: Self-pay | Admitting: Internal Medicine

## 2011-05-27 ENCOUNTER — Institutional Professional Consult (permissible substitution): Payer: 59 | Admitting: Pulmonary Disease

## 2011-08-22 LAB — CBC
HCT: 34.2 — ABNORMAL LOW
Hemoglobin: 11.9 — ABNORMAL LOW
MCHC: 34.8
RBC: 3.79 — ABNORMAL LOW
RDW: 13.6

## 2011-08-22 LAB — BASIC METABOLIC PANEL
Calcium: 8 — ABNORMAL LOW
GFR calc Af Amer: >60
GFR calc non Af Amer: >60
Glucose, Bld: 119 — ABNORMAL HIGH
Potassium: 4
Sodium: 135

## 2011-08-22 LAB — MAGNESIUM: Magnesium: 1.7

## 2011-08-25 LAB — COMPREHENSIVE METABOLIC PANEL
ALT: 17
AST: 17
Alkaline Phosphatase: 70
CO2: 27
Calcium: 9
GFR calc Af Amer: >60
Glucose, Bld: 89
Potassium: 4
Sodium: 141
Total Protein: 6.6

## 2011-08-25 LAB — URINALYSIS, ROUTINE W REFLEX MICROSCOPIC
Bilirubin Urine: NEGATIVE
Glucose, UA: NEGATIVE
Ketones, ur: NEGATIVE
Nitrite: NEGATIVE
Protein, ur: NEGATIVE
Specific Gravity, Urine: 1.01
Urobilinogen, UA: 0.2
pH: 6

## 2011-08-25 LAB — CBC
Hemoglobin: 14.7
MCHC: 33.6
RBC: 4.83
RDW: 13.7

## 2011-08-25 LAB — URINE MICROSCOPIC-ADD ON

## 2011-08-31 ENCOUNTER — Other Ambulatory Visit: Payer: Self-pay | Admitting: Internal Medicine

## 2012-02-13 ENCOUNTER — Other Ambulatory Visit: Payer: Self-pay | Admitting: *Deleted

## 2012-02-13 MED ORDER — DILTIAZEM HCL ER COATED BEADS 240 MG PO CP24: 240.0000 mg | ORAL_CAPSULE | Freq: Every day | ORAL | Status: DC

## 2012-04-08 LAB — COMPREHENSIVE METABOLIC PANEL
Albumin: 3.3 g/dL — ABNORMAL LOW (ref 3.4–5.0)
Anion Gap: 18 — ABNORMAL HIGH (ref 7–16)
BUN: 13 mg/dL (ref 7–18)
Co2: 20 mmol/L — ABNORMAL LOW (ref 21–32)
EGFR (Non-African Amer.): 52 — ABNORMAL LOW
Osmolality: 282 (ref 275–301)
Potassium: 3.5 mmol/L (ref 3.5–5.1)
SGOT(AST): 33 U/L (ref 15–37)
SGPT (ALT): 28 U/L
Sodium: 138 mmol/L (ref 136–145)

## 2012-04-08 LAB — AMMONIA: Ammonia, Plasma: <25 mcmol/L (ref 11–32)

## 2012-04-08 LAB — URINALYSIS, COMPLETE
Bilirubin,UR: NEGATIVE
Blood: NEGATIVE
Nitrite: NEGATIVE
Ph: 5 (ref 4.5–8.0)
Squamous Epithelial: <1
WBC UR: 103 /HPF (ref 0–5)

## 2012-04-08 LAB — DRUG SCREEN, URINE
Benzodiazepine, Ur Scrn: NEGATIVE (ref ?–200)
Cannabinoid 50 Ng, Ur ~~LOC~~: NEGATIVE (ref ?–50)
Opiate, Ur Screen: NEGATIVE (ref ?–300)
Phencyclidine (PCP) Ur S: NEGATIVE (ref ?–25)
Tricyclic, Ur Screen: NEGATIVE (ref ?–1000)

## 2012-04-08 LAB — CBC
MCH: 28 pg (ref 26.0–34.0)
MCHC: 32.4 g/dL (ref 32.0–36.0)
RBC: 5.51 10*6/uL — ABNORMAL HIGH (ref 3.80–5.20)
WBC: 13.3 10*3/uL — ABNORMAL HIGH (ref 3.6–11.0)

## 2012-04-08 LAB — ETHANOL
Ethanol %: <0.003 % (ref 0.000–0.080)
Ethanol: <3 mg/dL

## 2012-04-09 ENCOUNTER — Encounter: Payer: Self-pay | Admitting: Internal Medicine

## 2012-04-09 ENCOUNTER — Observation Stay: Payer: Self-pay | Admitting: Internal Medicine

## 2012-04-09 DIAGNOSIS — R55 Syncope and collapse: Secondary | ICD-10-CM

## 2012-04-09 LAB — HEMOGLOBIN: HGB: 11.1 g/dL — ABNORMAL LOW (ref 12.0–16.0)

## 2012-04-09 LAB — CK TOTAL AND CKMB (NOT AT ARMC)
CK, Total: 86 U/L (ref 21–215)
CK, Total: 161 U/L (ref 21–215)
CK-MB: 14.4 ng/mL — ABNORMAL HIGH (ref 0.5–3.6)
CK-MB: 7.4 ng/mL — ABNORMAL HIGH (ref 0.5–3.6)
CK-MB: 4.3 ng/mL — ABNORMAL HIGH (ref 0.5–3.6)

## 2012-04-09 LAB — LACTATE DEHYDROGENASE: LDH: 228 U/L (ref 84–246)

## 2012-04-09 LAB — TROPONIN I: Troponin-I: 0.06 ng/mL — ABNORMAL HIGH

## 2012-04-10 ENCOUNTER — Encounter: Payer: Self-pay | Admitting: Internal Medicine

## 2012-04-10 LAB — LIPID PANEL
Cholesterol: 159 mg/dL (ref 0–200)
Triglycerides: 230 mg/dL — ABNORMAL HIGH (ref 0–200)
VLDL Cholesterol, Calc: 46 mg/dL — ABNORMAL HIGH (ref 5–40)

## 2012-04-10 LAB — CBC WITH DIFFERENTIAL/PLATELET
Basophil #: 0.1 10*3/uL (ref 0.0–0.1)
Basophil %: 0.6 %
Eosinophil #: 0.3 10*3/uL (ref 0.0–0.7)
Eosinophil %: 3.9 %
HCT: 31.5 % — ABNORMAL LOW (ref 35.0–47.0)
HGB: 10.7 g/dL — ABNORMAL LOW (ref 12.0–16.0)
Lymphocyte #: 3.7 10*3/uL — ABNORMAL HIGH (ref 1.0–3.6)
MCHC: 33.9 g/dL (ref 32.0–36.0)
MCV: 85 fL (ref 80–100)
Monocyte #: 0.6 x10 3/mm (ref 0.2–0.9)
Neutrophil #: 4 10*3/uL (ref 1.4–6.5)
Platelet: 262 10*3/uL (ref 150–440)
WBC: 8.8 10*3/uL (ref 3.6–11.0)

## 2012-04-10 LAB — BASIC METABOLIC PANEL
BUN: 10 mg/dL (ref 7–18)
Calcium, Total: 8.1 mg/dL — ABNORMAL LOW (ref 8.5–10.1)
Co2: 29 mmol/L (ref 21–32)
Creatinine: 0.83 mg/dL (ref 0.60–1.30)
EGFR (African American): >60
EGFR (Non-African Amer.): >60
Osmolality: 287 (ref 275–301)
Sodium: 145 mmol/L (ref 136–145)

## 2012-04-10 LAB — HEMOGLOBIN A1C: Hemoglobin A1C: 5.7 % (ref 4.2–6.3)

## 2012-04-10 LAB — HEMOGLOBIN: HGB: 11.2 g/dL — ABNORMAL LOW (ref 12.0–16.0)

## 2012-04-11 LAB — CBC WITH DIFFERENTIAL/PLATELET
Basophil #: 0.1 10*3/uL (ref 0.0–0.1)
Basophil %: 1.2 %
Eosinophil #: 0.4 10*3/uL (ref 0.0–0.7)
Eosinophil %: 4.6 %
HCT: 31.3 % — ABNORMAL LOW (ref 35.0–47.0)
HGB: 10.6 g/dL — ABNORMAL LOW (ref 12.0–16.0)
Lymphocyte #: 2.6 10*3/uL (ref 1.0–3.6)
Lymphocyte %: 31.8 %
MCH: 28.9 pg (ref 26.0–34.0)
MCHC: 33.9 g/dL (ref 32.0–36.0)
MCV: 86 fL (ref 80–100)
Monocyte #: 0.5 x10 3/mm (ref 0.2–0.9)
Monocyte %: 6.1 %
Neutrophil #: 4.5 10*3/uL (ref 1.4–6.5)
Neutrophil %: 56.3 %
Platelet: 264 10*3/uL (ref 150–440)
RBC: 3.66 10*6/uL — ABNORMAL LOW (ref 3.80–5.20)
RDW: 13.5 % (ref 11.5–14.5)
WBC: 8.1 10*3/uL (ref 3.6–11.0)

## 2012-04-14 ENCOUNTER — Telehealth: Payer: Self-pay

## 2012-04-14 LAB — CULTURE, BLOOD (SINGLE)

## 2012-04-14 LAB — PATHOLOGY REPORT

## 2012-04-14 NOTE — Telephone Encounter (Signed)
Message copied by Festus Aloe on Wed Apr 14, 2012  4:34 PM ------      Message from: Lorine Bears A      Created: Fri Apr 09, 2012  6:08 PM      Regarding: stress test       I saw this patient at [facility] for syncope. If she does not get a stress test at [facility] before discharge, she will need to be scheduled for a Lexiscan Myoview in our office in [place] and follow up with Dr. Johney Frame.

## 2012-04-14 NOTE — Telephone Encounter (Signed)
NA x 1...no answering machine to leave a message. Kayla Hendrix

## 2012-04-14 NOTE — Telephone Encounter (Signed)
This is in error on the wrong patient.

## 2012-04-21 ENCOUNTER — Ambulatory Visit (INDEPENDENT_AMBULATORY_CARE_PROVIDER_SITE_OTHER): Payer: 59 | Admitting: Cardiology

## 2012-04-21 ENCOUNTER — Encounter: Payer: Self-pay | Admitting: Cardiology

## 2012-04-21 VITALS — BP 126/62 | HR 66 | Ht 66.0 in | Wt 217.0 lb

## 2012-04-21 DIAGNOSIS — I4891 Unspecified atrial fibrillation: Secondary | ICD-10-CM

## 2012-04-21 DIAGNOSIS — R7989 Other specified abnormal findings of blood chemistry: Secondary | ICD-10-CM

## 2012-04-21 DIAGNOSIS — R55 Syncope and collapse: Secondary | ICD-10-CM

## 2012-04-21 NOTE — Patient Instructions (Addendum)
Your physician recommends that you schedule a follow-up appointment in: 6 weeks with Rick Duff, PA  Your physician has requested that you have an exercise tolerance test. For further information please visit [email or web address] Please also follow instruction sheet, as given.

## 2012-04-21 NOTE — Progress Notes (Signed)
ELECTROPHYSIOLOGY CONSULT NOTE  Patient ID: Kayla Hendrix MRN: [id number], DOB/AGE: [birth year]   Date of Visit: 04/21/2012  Primary Physician: Nila Nephew, MD Primary Cardiologist: Hillis Range, MD Reason for Visit: Hospital follow-up  History of Present Illness Ms. Heinrich is a 61 year old woman with paroxysmal atrial fibrillation who presents today for follow-up after being admitted to [facility] 05/31-06/02 for syncope due to hypotension. Her HCTZ, lisinopril and long acting diltiazem was discontinued. She was discharged home on short acting diltiazem and scheduled for follow-up today regarding her AF. Of note, her troponin was mildly elevated and she was seen in consultation by Dr. Kirke Corin who felt it was likely related to her hypotensive episode, not ACS or acute plaque rupture. Dr. Kirke Corin recommended a stress test as an outpatient.   Today she reports her AF has been well controlled, stating she has not had any symptomatic episodes in over one year. She denies chest pain, shortness of breath or palpitations. She denies LE swelling, orthopnea or PND. She denies dizziness. She denies recurrent syncope since hospital discharge. She has been feeling well and is checking her BP at home twice daily. She reports her systolic readings have been within normal range, 110-130 consistently. Of note, she is taking aspirin only as she has been reluctant to start anticoagulation with warfarin or Xarelto in the past. In addition, she is currently experiencing rectal bleeding which is new for her. She is currently undergoing GI work-up, awaiting colonoscopy in the next few weeks.  Past Medical History  Diagnosis Date  . OSA (obstructive sleep apnea)     noncompliant with CPAP  . Glucose intolerance (impaired glucose tolerance)   . Atrial fibrillation     persistant  . Obesity   . HL (hearing loss)   . HTN (hypertension)   . GERD (gastroesophageal reflux disease)   . CAD (coronary  artery disease)     cath 2006- nonobstructive CAD    Past Surgical History  Procedure Date  . Total abdominal hysterectomy   . Cesarean section     Allergies/Intolerances No Known Allergies  Current Home Medications Medication Sig Dispense Refill  . aspirin 81 MG tablet Take 81 mg by mouth daily.      . clonazePAM (KLONOPIN) 0.5 MG tablet Take 0.5 mg by mouth 3 (three) times daily.        Marland Kitchen diltiazem (CARDIZEM) 30 MG tablet Take 30 mg by mouth 2 (two) times daily.      . ferrous sulfate 325 (65 FE) MG EC tablet Take 325 mg by mouth at bedtime.      Marland Kitchen FLUoxetine (PROZAC) 20 MG capsule Take 20 mg by mouth daily.        . metFORMIN (GLUCOPHAGE) 500 MG tablet Take 500 mg by mouth daily.        Marland Kitchen omeprazole (PRILOSEC) 20 MG capsule Take 20 mg by mouth 2 (two) times daily.        . simvastatin (ZOCOR) 10 MG tablet TAKE 1 TABLET BY MOUTH AT BEDTIME  30 tablet  11   Social History Social History  . Marital Status: Single    Spouse Name: N/A    Number of Children: N/A  . Years of Education: N/A   Social History Main Topics  . Smoking status: Former Games developer  . Smokeless tobacco: Never Used   Comment: 15-pack-year smoker  . Alcohol Use: No  . Drug Use: No  . Sexually Active: Not on file  Social History Narrative   Pt. Lives in [place] with her son. 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     Review of Systems General:  No chills, fever, night sweats or weight changes Cardiovascular:  No chest pain, dyspnea on exertion, edema, orthopnea, palpitations, paroxysmal nocturnal dyspnea Dermatological: No rash, lesions or masses Respiratory: No cough, dyspnea Urologic: No hematuria, dysuria Abdominal:   No nausea, vomiting, diarrhea, melena, or hematemesis Neurologic:  No visual changes, weakness, changes in mental status All other systems reviewed and are otherwise negative except as noted above.  Physical  Exam Blood pressure 126/62, pulse 66, height 5\' 6"  (1.676 m), weight 217 lb (98.431 kg).  General: Well developed, well appearing 61 year old female in no acute distress. HEENT: Normocephalic, atraumatic. EOMs intact. Sclera nonicteric. Oropharynx clear.  Neck: Supple without bruits. No JVD. Lungs: Respirations regular and unlabored, CTA bilaterally. No wheezes, rales or rhonchi. Heart: RRR. S1, S2 present. No murmurs, rub, S3 or S4. Abdomen: Soft, non-distended.  Extremities: No clubbing, cyanosis or edema. DP/PT/Radials 2+ and equal bilaterally. Psych: Normal affect. Neuro: Alert and oriented X 3. Moves all extremities spontaneously.   12-lead ECG today shows normal sinus rhythm with normal intervals at 66 bpm; PR 178 ms; QRS 92 ms; QT/QTc 428/448 ms  Assessment and Plan 1. Syncope in setting of dehydration and hypotension - resolved with medication adjustments as an inpatient; patient is normotensive today and BP readings at home are stable; agree with discontinuing HCTZ and lisinopril 2. Paroxysmal atrial fibrillation - stable; no symptomatic episodes in > 1 year; continue rate control with diltiazem; her CHADS2VASc score is 3 indicating she is at high risk for cardioembolic event, approximately 1.6% per year; however, she is currently undergoing GI work-up for rectal bleeding and is scheduled for colonoscopy in the next few weeks so we will hold off anticoagulation for now until this work-up is complete; return to clnic in 6 weeks for follow-up to discuss anticoagulation 3. Elevated troponin - in setting of syncope due to dehydration and hypotension; Dr. Kirke Corin recommended a stress test as an outpatient which we will order today   This plan of care was formulated with Dr. Johney Frame who was in to see the patient with me. Signed, Rick Duff, PA-C 04/21/2012, 12:00 PM

## 2012-04-22 ENCOUNTER — Encounter: Payer: Self-pay | Admitting: Internal Medicine

## 2012-04-27 ENCOUNTER — Encounter: Payer: Self-pay | Admitting: Internal Medicine

## 2012-04-27 ENCOUNTER — Encounter: Payer: Self-pay | Admitting: Cardiovascular Disease

## 2012-05-10 ENCOUNTER — Ambulatory Visit (INDEPENDENT_AMBULATORY_CARE_PROVIDER_SITE_OTHER): Payer: 59 | Admitting: Nurse Practitioner

## 2012-05-10 DIAGNOSIS — R0989 Other specified symptoms and signs involving the circulatory and respiratory systems: Secondary | ICD-10-CM

## 2012-05-10 DIAGNOSIS — I4891 Unspecified atrial fibrillation: Secondary | ICD-10-CM

## 2012-05-10 NOTE — Procedures (Deleted)
Exercise Treadmill Test  Pre-Exercise Testing Evaluation Rhythm: normal sinus  Rate: 68   PR:  .17 QRS:  .09  QT:  .43 QTc: .46     Test  Exercise Tolerance Test Ordering MD: Hillis Range, MD  Interpreting MD: Ward Givens , NP  Unique Test No: 1  Treadmill:  1  Indication for ETT: A-FIB  Contraindication to ETT: No   Stress Modality: exercise - treadmill  Cardiac Imaging Performed: non   Protocol: standard Bruce - maximal  Max BP:  ***/***  Max MPHR (bpm):  160 85% MPR (bpm):  136  MPHR obtained (bpm):  *** % MPHR obtained:  ***  Reached 85% MPHR (min:sec):  *** Total Exercise Time (min-sec):  ***  Workload in METS:  *** Borg Scale: ***  Reason ETT Terminated:  {CHL REASON TERMINATED FOR ZOX:09604540}    ST Segment Analysis At Rest: {CHL ST SEGMENT AT REST FOR JWJ:19147829} With Exercise: {CHL ST SEGMENT WITH EXERCISE FOR FAO:13086578}  Other Information Arrhythmia:  {CHL ARRHYTHMIA FOR ION:62952841} Angina during ETT:  {CHL ANGINA DURING LKG:40102725} Quality of ETT:  {CHL QUALITY OF DGU:44034742}  ETT Interpretation:  {CHL INTERPRETATION FOR VZD:63875643}  Comments: ***  Recommendations: ***

## 2012-05-12 ENCOUNTER — Encounter: Payer: 59 | Admitting: Cardiology

## 2012-05-14 ENCOUNTER — Encounter: Payer: Self-pay | Admitting: Internal Medicine

## 2012-05-20 ENCOUNTER — Ambulatory Visit (HOSPITAL_COMMUNITY): Payer: 59 | Attending: Internal Medicine | Admitting: Radiology

## 2012-05-20 VITALS — BP 139/69 | HR 60 | Ht 66.0 in | Wt 214.0 lb

## 2012-05-20 DIAGNOSIS — E119 Type 2 diabetes mellitus without complications: Secondary | ICD-10-CM | POA: Insufficient documentation

## 2012-05-20 DIAGNOSIS — I10 Essential (primary) hypertension: Secondary | ICD-10-CM | POA: Insufficient documentation

## 2012-05-20 DIAGNOSIS — R55 Syncope and collapse: Secondary | ICD-10-CM

## 2012-05-20 DIAGNOSIS — Z87891 Personal history of nicotine dependence: Secondary | ICD-10-CM | POA: Insufficient documentation

## 2012-05-20 DIAGNOSIS — R002 Palpitations: Secondary | ICD-10-CM | POA: Insufficient documentation

## 2012-05-20 DIAGNOSIS — E785 Hyperlipidemia, unspecified: Secondary | ICD-10-CM | POA: Insufficient documentation

## 2012-05-20 DIAGNOSIS — E669 Obesity, unspecified: Secondary | ICD-10-CM | POA: Insufficient documentation

## 2012-05-20 DIAGNOSIS — E7800 Pure hypercholesterolemia, unspecified: Secondary | ICD-10-CM

## 2012-05-20 DIAGNOSIS — Z8249 Family history of ischemic heart disease and other diseases of the circulatory system: Secondary | ICD-10-CM | POA: Insufficient documentation

## 2012-05-20 DIAGNOSIS — R0609 Other forms of dyspnea: Secondary | ICD-10-CM | POA: Insufficient documentation

## 2012-05-20 DIAGNOSIS — I4891 Unspecified atrial fibrillation: Secondary | ICD-10-CM

## 2012-05-20 DIAGNOSIS — R0989 Other specified symptoms and signs involving the circulatory and respiratory systems: Secondary | ICD-10-CM | POA: Insufficient documentation

## 2012-05-20 DIAGNOSIS — R0602 Shortness of breath: Secondary | ICD-10-CM

## 2012-05-20 DIAGNOSIS — I4510 Unspecified right bundle-branch block: Secondary | ICD-10-CM | POA: Insufficient documentation

## 2012-05-20 MED ORDER — TECHNETIUM TC 99M TETROFOSMIN IV KIT
11.0000 | PACK | Freq: Once | INTRAVENOUS | Status: AC | PRN
  Administered 2012-05-20: 11 via INTRAVENOUS

## 2012-05-20 MED ORDER — TECHNETIUM TC 99M TETROFOSMIN IV KIT
33.0000 | PACK | Freq: Once | INTRAVENOUS | Status: AC | PRN
  Administered 2012-05-20: 33 via INTRAVENOUS

## 2012-05-20 MED ORDER — REGADENOSON 0.4 MG/5ML IV SOLN
0.4000 mg | Freq: Once | INTRAVENOUS | Status: AC
  Administered 2012-05-20: 0.4 mg via INTRAVENOUS

## 2012-05-20 NOTE — Progress Notes (Signed)
[facility] SITE 3 NUCLEAR MED [address] [place] [address] [phone]  Cardiology Nuclear Med Study  Kayla Hendrix is a 61 y.o. female     MRN : [id number]     DOB: 11-04-1951  Procedure Date: 05/20/2012  Nuclear Med Background Indication for Stress Test:  Evaluation for Ischemia History:  PAF; '06 Cath:N/O CAD, medical tx; '10 Cardioversion; 04/09/12 Echo:EF>55% Cardiac Risk Factors: Family History - CAD, History of Smoking, Hypertension, Lipids, NIDDM, Obesity and IRBBB  Symptoms:  DOE, Palpitations and 04/09/12 Syncope [facility] with mildly elevated troponin)   Nuclear Pre-Procedure Caffeine/Decaff Intake:  None NPO After: 10:00pm   Lungs:  clear O2 Sat: 95% on room air. IV 0.9% NS with Angio Cath:  22g  IV Site: R Forearm  IV Started by:  Bonnita Levan, RN  Chest Size (in):  44 Cup Size: DD  Height: 5\' 6"  (1.676 m)  Weight:  214 lb (97.07 kg)  BMI:  Body mass index is 34.54 kg/(m^2). Tech Comments:  Metformin held this am, BS@530  AM=109 per pt    Nuclear Med Study 1 or 2 day study: 1 day  Stress Test Type:  Eugenie Birks  Reading MD: Dietrich Pates, MD  Order Authorizing Provider:  Hillis Range, MD  Resting Radionuclide: Technetium 83m Tetrofosmin  Resting Radionuclide Dose: 11.0 mCi   Stress Radionuclide:  Technetium 82m Tetrofosmin  Stress Radionuclide Dose: 31.8 mCi           Stress Protocol Rest HR: 60 Stress HR: 105  Rest BP: 139/69 Stress BP: 166/42  Exercise Time (min): 2:00 METS: n/a   Predicted Max HR: 160 bpm % Max HR: 65.62 bpm Rate Pressure Product: 24401   Dose of Adenosine (mg):  n/a Dose of Lexiscan: 0.4 mg  Dose of Atropine (mg): n/a Dose of Dobutamine: n/a mcg/kg/min (at max HR)  Stress Test Technologist: Smiley Houseman, CMA-N  Nuclear Technologist:  Domenic Polite, CNMT     Rest Procedure:  Myocardial perfusion imaging was performed at rest 45 minutes following the intravenous administration of Technetium 64m Tetrofosmin.  Rest  ECG: IRBBB.  Stress Procedure:  The patient received IV Lexiscan 0.4 mg over 15-seconds with concurrent low level exercise and then Technetium 15m Tetrofosmin was injected at 30-seconds while the patient continued walking one more minute. There were no significant changes with Lexiscan. Quantitative spect images were obtained after a 45-minute delay.  Stress ECG: No significant ST segment change suggestive of ischemia.  QPS Raw Data Images:  Stress images were motion corrected. Stress Images:  Defect in the apical region with thinning of the distal anterior, inferior wall.  Otherwise normal perfusion. Rest Images:  Incomplete improvement consistent with periinfarct ischemia. Subtraction (SDS):  mild ischemia. Transient Ischemic Dilatation (Normal <1.22):  1.03 Lung/Heart Ratio (Normal <0.45):  0.21  Quantitative Gated Spect Images QGS EDV:  95 ml QGS ESV:  27 ml  Impression Exercise Capacity:  Lexiscan with low level exercise. BP Response:  Normal blood pressure response. Clinical Symptoms:  No chest pain. ECG Impression:  No significant ST segment change suggestive of ischemia. Comparison with Prior Nuclear Study: No previous nuclear study performed  Overall Impression:  Small apical defect with minimal improvement in the rest images.  Consistent with subendocardial scar with minimal periinfarct ischemia.  Review of raw data shows signif soft tissue (breast) attenuation.  Cannot exclude soft tissue attenuation with minimal shifting.   OVerall low risk scan.  LV Ejection Fraction: 72%.  LV Wall Motion:  NL LV Function; NL Wall Motion  Dietrich Pates

## 2012-05-24 ENCOUNTER — Other Ambulatory Visit: Payer: Self-pay

## 2012-05-24 MED ORDER — DILTIAZEM HCL 30 MG PO TABS: 30.0000 mg | ORAL_TABLET | Freq: Two times a day (BID) | ORAL | Status: DC

## 2012-06-01 ENCOUNTER — Ambulatory Visit: Payer: 59 | Admitting: Cardiology

## 2012-06-01 ENCOUNTER — Encounter: Payer: 59 | Admitting: Cardiology

## 2012-06-07 ENCOUNTER — Encounter: Payer: Self-pay | Admitting: Internal Medicine

## 2012-06-07 ENCOUNTER — Ambulatory Visit (INDEPENDENT_AMBULATORY_CARE_PROVIDER_SITE_OTHER): Payer: 59 | Admitting: Internal Medicine

## 2012-06-07 VITALS — BP 136/78 | HR 80 | Resp 18 | Ht 66.0 in | Wt 218.0 lb

## 2012-06-07 DIAGNOSIS — I4891 Unspecified atrial fibrillation: Secondary | ICD-10-CM

## 2012-06-07 DIAGNOSIS — I10 Essential (primary) hypertension: Secondary | ICD-10-CM

## 2012-06-07 MED ORDER — DILTIAZEM HCL ER COATED BEADS 240 MG PO CP24: 240.0000 mg | ORAL_CAPSULE | Freq: Every day | ORAL | Status: DC

## 2012-06-07 NOTE — Patient Instructions (Addendum)
Your physician wants you to follow-up in: 6 months with Sunday Spillers  and Dr Johney Frame in 12 months  You will receive a reminder letter in the mail two months in advance. If you don't receive a letter, please call our office to schedule the follow-up appointment.   Your physician has recommended you make the following change in your medication:  1) Stop Cardizem 30mg  2) Start Cardizem 240mg  daily

## 2012-06-07 NOTE — Assessment & Plan Note (Signed)
Doing well at this point off of AAD She declines coumadin or a novel anticoagulant despite increased chadsvasc score She will continue to contemplate this going forward.  I have informed her that she is at increased risk for stroke.  She should return to her prior cardizem DC 240mg  daily at this time.

## 2012-06-07 NOTE — Progress Notes (Signed)
PCP: Enrique Sack, MD   Kayla Hendrix is a 61 y.o. female who presents today for routine electrophysiology followup.  Since last being seen by our PA, the patient reports doing very well.  Today, she denies symptoms of palpitations, chest pain, shortness of breath,  lower extremity edema, dizziness, presyncope, or syncope.  The patient is otherwise without complaint today.   Past Medical History  Diagnosis Date  . OSA (obstructive sleep apnea)     noncompliant with CPAP  . Glucose intolerance (impaired glucose tolerance)   . Atrial fibrillation     persistant  . Obesity   . HL (hearing loss)   . HTN (hypertension)   . GERD (gastroesophageal reflux disease)   . CAD (coronary artery disease)     cath 2006- nonobstructive CAD   Past Surgical History  Procedure Date  . Total abdominal hysterectomy   . Cesarean section     Current Outpatient Prescriptions  Medication Sig Dispense Refill  . aspirin 81 MG tablet Take 81 mg by mouth daily.      . clonazePAM (KLONOPIN) 0.5 MG tablet Take 0.5 mg by mouth 3 (three) times daily.        Marland Kitchen FLUoxetine (PROZAC) 20 MG capsule Take 20 mg by mouth daily.        . metFORMIN (GLUCOPHAGE) 500 MG tablet Take 500 mg by mouth daily.        Marland Kitchen omeprazole (PRILOSEC) 20 MG capsule Take 20 mg by mouth 2 (two) times daily.        . simvastatin (ZOCOR) 10 MG tablet TAKE 1 TABLET BY MOUTH AT BEDTIME  30 tablet  11  . ferrous sulfate 325 (65 FE) MG EC tablet Take 325 mg by mouth at bedtime.        Physical Exam: Filed Vitals:   06/07/12 1506  BP: 136/78  Pulse: 80  Resp: 18  Height: 5\' 6"  (1.676 m)  Weight: 218 lb (98.884 kg)  SpO2: 98%    GEN- The patient is well appearing, alert and oriented x 3 today.   Head- normocephalic, atraumatic Eyes-  Sclera clear, conjunctiva pink Ears- hearing intact Oropharynx- clear Lungs- Clear to ausculation bilaterally, normal work of breathing Heart- Regular rate and rhythm, no murmurs, rubs or gallops, PMI  not laterally displaced GI- soft, NT, ND, + BS Extremities- no clubbing, cyanosis, or edema  ekg today reveals sinus rhythm 77 bpm, incomplete RBBB  Assessment and Plan:

## 2012-06-07 NOTE — Assessment & Plan Note (Signed)
Stable off of hctz and ace inhibitor (recently stopped with orthostatic syncopal event). Resume prior cardizem at this time.

## 2013-03-25 ENCOUNTER — Other Ambulatory Visit: Payer: Self-pay

## 2013-03-25 DIAGNOSIS — Z1231 Encounter for screening mammogram for malignant neoplasm of breast: Secondary | ICD-10-CM

## 2013-04-08 ENCOUNTER — Other Ambulatory Visit: Payer: Self-pay | Admitting: Emergency Medicine

## 2013-04-08 MED ORDER — SIMVASTATIN 10 MG PO TABS: ORAL_TABLET | ORAL | Status: DC

## 2013-05-02 ENCOUNTER — Ambulatory Visit
Admission: RE | Admit: 2013-05-02 | Discharge: 2013-05-02 | Disposition: A | Discharge status: Home / Self Care | Payer: 59 | Source: Ambulatory Visit

## 2013-05-02 DIAGNOSIS — Z1231 Encounter for screening mammogram for malignant neoplasm of breast: Secondary | ICD-10-CM

## 2013-05-04 ENCOUNTER — Other Ambulatory Visit: Payer: Self-pay | Admitting: Internal Medicine

## 2013-05-04 DIAGNOSIS — R928 Other abnormal and inconclusive findings on diagnostic imaging of breast: Secondary | ICD-10-CM

## 2013-05-19 ENCOUNTER — Ambulatory Visit
Admission: RE | Admit: 2013-05-19 | Discharge: 2013-05-19 | Disposition: A | Discharge status: Home / Self Care | Payer: 59 | Source: Ambulatory Visit | Attending: Internal Medicine | Admitting: Internal Medicine

## 2013-05-19 DIAGNOSIS — R928 Other abnormal and inconclusive findings on diagnostic imaging of breast: Secondary | ICD-10-CM

## 2013-06-15 ENCOUNTER — Encounter: Payer: Self-pay | Admitting: Internal Medicine

## 2013-06-15 ENCOUNTER — Ambulatory Visit (INDEPENDENT_AMBULATORY_CARE_PROVIDER_SITE_OTHER): Payer: 59 | Admitting: Internal Medicine

## 2013-06-15 VITALS — BP 152/79 | HR 93 | Ht 66.0 in | Wt 246.0 lb

## 2013-06-15 DIAGNOSIS — I4891 Unspecified atrial fibrillation: Secondary | ICD-10-CM

## 2013-06-15 DIAGNOSIS — G4733 Obstructive sleep apnea (adult) (pediatric): Secondary | ICD-10-CM

## 2013-06-15 DIAGNOSIS — I10 Essential (primary) hypertension: Secondary | ICD-10-CM

## 2013-06-15 LAB — BASIC METABOLIC PANEL
CO2: 29 mEq/L (ref 19–32)
Chloride: 102 mEq/L (ref 96–112)
Glucose, Bld: 125 mg/dL — ABNORMAL HIGH (ref 70–99)
Potassium: 4.1 mEq/L (ref 3.5–5.1)
Sodium: 140 mEq/L (ref 135–145)

## 2013-06-15 LAB — CBC WITH DIFFERENTIAL/PLATELET
Basophils Relative: 0.6 % (ref 0.0–3.0)
Eosinophils Absolute: 0.5 10*3/uL (ref 0.0–0.7)
HCT: 40 % (ref 36.0–46.0)
Hemoglobin: 13.1 g/dL (ref 12.0–15.0)
Lymphocytes Relative: 27.5 % (ref 12.0–46.0)
Lymphs Abs: 2.2 10*3/uL (ref 0.7–4.0)
MCHC: 32.8 g/dL (ref 30.0–36.0)
MCV: 82.2 fl (ref 78.0–100.0)
Monocytes Absolute: 0.5 10*3/uL (ref 0.1–1.0)
Neutro Abs: 4.8 10*3/uL (ref 1.4–7.7)
RBC: 4.86 Mil/uL (ref 3.87–5.11)

## 2013-06-15 MED ORDER — RIVAROXABAN 20 MG PO TABS: 20.0000 mg | ORAL_TABLET | Freq: Every day | ORAL | Status: DC

## 2013-06-15 MED ORDER — HYDROCHLOROTHIAZIDE 25 MG PO TABS: 12.5000 mg | ORAL_TABLET | Freq: Every day | ORAL | Status: DC

## 2013-06-15 NOTE — Progress Notes (Signed)
PCP: Enrique Sack, MD   Kayla Hendrix is a 62 y.o. female who presents today for routine electrophysiology followup.  Since last being seen, the patient reports doing very well.  Today, she denies symptoms of palpitations, chest pain, shortness of breath,  lower extremity edema, dizziness, presyncope, or syncope.  The patient is otherwise without complaint today.   Past Medical History  Diagnosis Date  . OSA (obstructive sleep apnea)     noncompliant with CPAP  . Glucose intolerance (impaired glucose tolerance)   . Atrial fibrillation     persistant  . Obesity   . HL (hearing loss)   . HTN (hypertension)   . GERD (gastroesophageal reflux disease)   . CAD (coronary artery disease)     cath 2006- nonobstructive CAD   Past Surgical History  Procedure Laterality Date  . Total abdominal hysterectomy    . Cesarean section      Current Outpatient Prescriptions  Medication Sig Dispense Refill  . aspirin 81 MG tablet Take 81 mg by mouth daily.      . clonazePAM (KLONOPIN) 0.5 MG tablet Take 0.5 mg by mouth 3 (three) times daily.        Marland Kitchen diltiazem (DILACOR XR) 240 MG 24 hr capsule Take 240 mg by mouth daily.      Marland Kitchen FLUoxetine (PROZAC) 20 MG capsule Take 20 mg by mouth daily.        . hydrochlorothiazide (HYDRODIURIL) 25 MG tablet Take 25 mg by mouth daily.      . metFORMIN (GLUCOPHAGE) 500 MG tablet Take 500 mg by mouth daily.        Marland Kitchen omeprazole (PRILOSEC) 20 MG capsule Take 20 mg by mouth 2 (two) times daily.        . simvastatin (ZOCOR) 10 MG tablet TAKE 1 TABLET BY MOUTH AT BEDTIME  30 tablet  2   No current facility-administered medications for this visit.    Physical Exam: Filed Vitals:   06/15/13 0833  BP: 152/79  Pulse: 93  Height: 5\' 6"  (1.676 m)  Weight: 246 lb (111.585 kg)    GEN- The patient is well appearing, alert and oriented x 3 today.   Head- normocephalic, atraumatic Eyes-  Sclera clear, conjunctiva pink Ears- hearing intact Oropharynx- clear Lungs-  Clear to ausculation bilaterally, normal work of breathing Heart- Regular rate and rhythm, no murmurs, rubs or gallops, PMI not laterally displaced GI- soft, NT, ND, + BS Extremities- no clubbing, cyanosis, or edema  ekg today reveals sinus rhythm 87 bpm, incomplete RBBB  Assessment and Plan:   1. afib Stable CHADS2VASC score is at least 3.  Today, I discussed novel anticoagulants including pradaxa, xarelto, and eliquis today as indicated for risk reduction in stroke and systemic emboli with nonvalvular atrial fibrillation.  Risks, benefits, and alternatives to each of these drugs were discussed at length today.  SHe prefers xarelto.  I will stop ASA and start xarelto today.  Check BMET and CBC today.  2. HTN Repeat by MD is 138/72 I worry about HCTZ given her prior difficulty with presyncope/ orthostasis with this medicine Decrease to 12.5mg  daily bmet today Adequate hydration is encouraged  3. Obesity Weight loss  4. OSA Noncompliant with CPAP  Follow-up with EP PA in 6months I will see in a year

## 2013-06-15 NOTE — Patient Instructions (Signed)
Your physician wants you to follow-up in: 6 months with Rick Duff, PA and Dr Johney Frame in 12 months  You will receive a reminder letter in the mail two months in advance. If you don't receive a letter, please call our office to schedule the follow-up appointment.  Your physician has recommended you make the following change in your medication:  1) Stop Aspirin 2) Decrease HCTZ to 1/2 tablet daily 3) Start Xarelto 20mg  daily   Your physician recommends that you return for lab work today: BMP/CBC

## 2013-06-25 ENCOUNTER — Other Ambulatory Visit: Payer: Self-pay | Admitting: Internal Medicine

## 2013-07-03 ENCOUNTER — Other Ambulatory Visit: Payer: Self-pay | Admitting: Internal Medicine

## 2013-09-15 ENCOUNTER — Other Ambulatory Visit: Payer: Self-pay

## 2014-02-28 ENCOUNTER — Other Ambulatory Visit: Payer: Self-pay | Admitting: Internal Medicine

## 2014-02-28 DIAGNOSIS — R921 Mammographic calcification found on diagnostic imaging of breast: Secondary | ICD-10-CM

## 2014-03-08 ENCOUNTER — Ambulatory Visit
Admission: RE | Admit: 2014-03-08 | Discharge: 2014-03-08 | Disposition: A | Discharge status: Home / Self Care | Payer: 59 | Source: Ambulatory Visit | Attending: Internal Medicine | Admitting: Internal Medicine

## 2014-03-08 DIAGNOSIS — R921 Mammographic calcification found on diagnostic imaging of breast: Secondary | ICD-10-CM

## 2014-05-04 ENCOUNTER — Ambulatory Visit (INDEPENDENT_AMBULATORY_CARE_PROVIDER_SITE_OTHER): Payer: 59

## 2014-05-04 ENCOUNTER — Ambulatory Visit (INDEPENDENT_AMBULATORY_CARE_PROVIDER_SITE_OTHER): Payer: 59 | Admitting: Family Medicine

## 2014-05-04 VITALS — BP 138/80 | HR 76 | Temp 98.1°F | Resp 14 | Ht 66.0 in | Wt 242.6 lb

## 2014-05-04 DIAGNOSIS — T148XXA Other injury of unspecified body region, initial encounter: Secondary | ICD-10-CM

## 2014-05-04 DIAGNOSIS — M542 Cervicalgia: Secondary | ICD-10-CM

## 2014-05-04 DIAGNOSIS — IMO0002 Reserved for concepts with insufficient information to code with codable children: Secondary | ICD-10-CM

## 2014-05-04 DIAGNOSIS — G44319 Acute post-traumatic headache, not intractable: Secondary | ICD-10-CM

## 2014-05-04 DIAGNOSIS — R0781 Pleurodynia: Secondary | ICD-10-CM

## 2014-05-04 DIAGNOSIS — R079 Chest pain, unspecified: Secondary | ICD-10-CM

## 2014-05-04 MED ORDER — MUPIROCIN 2 % EX OINT: TOPICAL_OINTMENT | CUTANEOUS | Status: DC

## 2014-05-04 NOTE — Patient Instructions (Signed)
Concussion  A concussion, or closed-head injury, is a brain injury caused by a direct blow to the head or by a quick and sudden movement (jolt) of the head or neck. Concussions are usually not life-threatening. Even so, the effects of a concussion can be serious. If you have had a concussion before, you are more likely to experience concussion-like symptoms after a direct blow to the head.   CAUSES  · Direct blow to the head, such as from running into another player during a soccer game, being hit in a fight, or hitting your head on a hard surface.  · A jolt of the head or neck that causes the brain to move back and forth inside the skull, such as in a car crash.  SIGNS AND SYMPTOMS  The signs of a concussion can be hard to notice. Early on, they may be missed by you, family members, and health care providers. You may look fine but act or feel differently.  Symptoms are usually temporary, but they may last for days, weeks, or even longer. Some symptoms may appear right away while others may not show up for hours or days. Every head injury is different. Symptoms include:  · Mild to moderate headaches that will not go away.  · A feeling of pressure inside your head.  · Having more trouble than usual:  ¨ Learning or remembering things you have heard.  ¨ Answering questions.  ¨ Paying attention or concentrating.  ¨ Organizing daily tasks.  ¨ Making decisions and solving problems.  · Slowness in thinking, acting or reacting, speaking, or reading.  · Getting lost or being easily confused.  · Feeling tired all the time or lacking energy (fatigued).  · Feeling drowsy.  · Sleep disturbances.  ¨ Sleeping more than usual.  ¨ Sleeping less than usual.  ¨ Trouble falling asleep.  ¨ Trouble sleeping (insomnia).  · Loss of balance or feeling lightheaded or dizzy.  · Nausea or vomiting.  · Numbness or tingling.  · Increased sensitivity to:  ¨ Sounds.  ¨ Lights.  ¨ Distractions.  · Vision problems or eyes that tire  easily.  · Diminished sense of taste or smell.  · Ringing in the ears.  · Mood changes such as feeling sad or anxious.  · Becoming easily irritated or angry for little or no reason.  · Lack of motivation.  · Seeing or hearing things other people do not see or hear (hallucinations).  DIAGNOSIS  Your health care provider can usually diagnose a concussion based on a description of your injury and symptoms. He or she will ask whether you passed out (lost consciousness) and whether you are having trouble remembering events that happened right before and during your injury.  Your evaluation might include:  · A brain scan to look for signs of injury to the brain. Even if the test shows no injury, you may still have a concussion.  · Blood tests to be sure other problems are not present.  TREATMENT  · Concussions are usually treated in an emergency department, in urgent care, or at a clinic. You may need to stay in the hospital overnight for further treatment.  · Tell your health care provider if you are taking any medicines, including prescription medicines, over-the-counter medicines, and natural remedies. Some medicines, such as blood thinners (anticoagulants) and aspirin, may increase the chance of complications. Also tell your health care provider whether you have had alcohol or are taking illegal drugs. This information   may affect treatment.  · Your health care provider will send you home with important instructions to follow.  · How fast you will recover from a concussion depends on many factors. These factors include how severe your concussion is, what part of your brain was injured, your age, and how healthy you were before the concussion.  · Most people with mild injuries recover fully. Recovery can take time. In general, recovery is slower in older persons. Also, persons who have had a concussion in the past or have other medical problems may find that it takes longer to recover from their current injury.  HOME  CARE INSTRUCTIONS  General Instructions  · Carefully follow the directions your health care provider gave you.  · Only take over-the-counter or prescription medicines for pain, discomfort, or fever as directed by your health care provider.  · Take only those medicines that your health care provider has approved.  · Do not drink alcohol until your health care provider says you are well enough to do so. Alcohol and certain other drugs may slow your recovery and can put you at risk of further injury.  · If it is harder than usual to remember things, write them down.  · If you are easily distracted, try to do one thing at a time. For example, do not try to watch TV while fixing dinner.  · Talk with family members or close friends when making important decisions.  · Keep all follow-up appointments. Repeated evaluation of your symptoms is recommended for your recovery.  · Watch your symptoms and tell others to do the same. Complications sometimes occur after a concussion. Older adults with a brain injury may have a higher risk of serious complications, such as a blood clot on the brain.  · Tell your teachers, school nurse, school counselor, coach, athletic trainer, or work manager about your injury, symptoms, and restrictions. Tell them about what you can or cannot do. They should watch for:  ¨ Increased problems with attention or concentration.  ¨ Increased difficulty remembering or learning new information.  ¨ Increased time needed to complete tasks or assignments.  ¨ Increased irritability or decreased ability to cope with stress.  ¨ Increased symptoms.  · Rest. Rest helps the brain to heal. Make sure you:  ¨ Get plenty of sleep at night. Avoid staying up late at night.  ¨ Keep the same bedtime hours on weekends and weekdays.  ¨ Rest during the day. Take daytime naps or rest breaks when you feel tired.  · Limit activities that require a lot of thought or concentration. These include:  ¨ Doing homework or job-related  work.  ¨ Watching TV.  ¨ Working on the computer.  · Avoid any situation where there is potential for another head injury (football, hockey, soccer, basketball, martial arts, downhill snow sports and horseback riding). Your condition will get worse every time you experience a concussion. You should avoid these activities until you are evaluated by the appropriate follow-up health care providers.  Returning To Your Regular Activities  You will need to return to your normal activities slowly, not all at once. You must give your body and brain enough time for recovery.  · Do not return to sports or other athletic activities until your health care provider tells you it is safe to do so.  · Ask your health care provider when you can drive, ride a bicycle, or operate heavy machinery. Your ability to react may be slower after a   brain injury. Never do these activities if you are dizzy.  · Ask your health care provider about when you can return to work or school.  Preventing Another Concussion  It is very important to avoid another brain injury, especially before you have recovered. In rare cases, another injury can lead to permanent brain damage, brain swelling, or death. The risk of this is greatest during the first 7-10 days after a head injury. Avoid injuries by:  · Wearing a seat belt when riding in a car.  · Drinking alcohol only in moderation.  · Wearing a helmet when biking, skiing, skateboarding, skating, or doing similar activities.  · Avoiding activities that could lead to a second concussion, such as contact or recreational sports, until your health care provider says it is okay.  · Taking safety measures in your home.  ¨ Remove clutter and tripping hazards from floors and stairways.  ¨ Use grab bars in bathrooms and handrails by stairs.  ¨ Place non-slip mats on floors and in bathtubs.  ¨ Improve lighting in dim areas.  SEEK MEDICAL CARE IF:  · You have increased problems paying attention or  concentrating.  · You have increased difficulty remembering or learning new information.  · You need more time to complete tasks or assignments than before.  · You have increased irritability or decreased ability to cope with stress.  · You have more symptoms than before.  Seek medical care if you have any of the following symptoms for more than 2 weeks after your injury:  · Lasting (chronic) headaches.  · Dizziness or balance problems.  · Nausea.  · Vision problems.  · Increased sensitivity to noise or light.  · Depression or mood swings.  · Anxiety or irritability.  · Memory problems.  · Difficulty concentrating or paying attention.  · Sleep problems.  · Feeling tired all the time.  SEEK IMMEDIATE MEDICAL CARE IF:  · You have severe or worsening headaches. These may be a sign of a blood clot in the brain.  · You have weakness (even if only in one hand, leg, or part of the face).  · You have numbness.  · You have decreased coordination.  · You vomit repeatedly.  · You have increased sleepiness.  · One pupil is larger than the other.  · You have convulsions.  · You have slurred speech.  · You have increased confusion. This may be a sign of a blood clot in the brain.  · You have increased restlessness, agitation, or irritability.  · You are unable to recognize people or places.  · You have neck pain.  · It is difficult to wake you up.  · You have unusual behavior changes.  · You lose consciousness.  MAKE SURE YOU:  · Understand these instructions.  · Will watch your condition.  · Will get help right away if you are not doing well or get worse.  Document Released: 01/17/2004 Document Revised: 11/01/2013 Document Reviewed: 05/19/2013  [employer]® Patient Information ©2015 [employer]. This information is not intended to replace advice given to you by your health care provider. Make sure you discuss any questions you have with your health care provider.

## 2014-05-04 NOTE — Progress Notes (Signed)
Chief Complaint:  Chief Complaint  Patient presents with  . Head Injury    fell on porch last night  . Rib Injury    pain on left side    HPI: Kayla Hendrix is a 63 y.o. female who is here for  Head pain, neck pain, shoulder pain, and also rib pain on the left side after falling off her porch  Yesterday evening while beding over her wood porch to feed some neighborhood cats , she thinks she may have tripped over a nail and then went down on the gorund, she thinks the fall was about 3 feet high, she hit her head but did not have any LOC. She has forehead pain, has HA but no LOC, Doubles vision, nausea, vomiting, abd pain. She does not have SOB but does have CP with deep breaths on the left side. She is on asa 81 mg for PAF but is not on any other blood thinners. She has a h/o PAF and they put her on xarelto for this but she could nt tolerate due to HAs. She has chronic h/o upper back and neck pan, shoudler pain and spinal stenosis. Followed by Dr Delilah Shan.  She denies osteoporosis or osteopenia. She has had tremores int he past and it gets worse when she does a lot of looking down or repetitive mption with her job, her tremors have gotten worse since she started this new job, her currently supervisor is not very understanding per patient. No h.o parkinsons/carpal tunnel, numbness, wekaness or tingling. + stiffness, pain  Past Medical History  Diagnosis Date  . OSA (obstructive sleep apnea)     noncompliant with CPAP  . Glucose intolerance (impaired glucose tolerance)   . Atrial fibrillation     paroxysmal only asa 81 mg, no other blood thinner, the xarelto she was on gave her HAs  . Obesity   . HL (hearing loss)   . HTN (hypertension)   . GERD (gastroesophageal reflux disease)   . CAD (coronary artery disease)     cath 2006- nonobstructive CAD  . Depression   . Diabetes mellitus without complication    Past Surgical History  Procedure Laterality Date  . Total abdominal  hysterectomy    . Cesarean section     History   Social History  . Marital Status: Single    Spouse Name: N/A    Number of Children: N/A  . Years of Education: N/A   Social History Main Topics  . Smoking status: Former Research scientist (life sciences)  . Smokeless tobacco: Never Used     Comment: 15-pack-year smoker  . Alcohol Use: No  . Drug Use: No  . Sexual Activity: None   Other Topics Concern  . None   Social History Narrative   Pt. Lives in [place] with her son. 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    Family History  Problem Relation Age of Onset  . Diabetes      noninsulin dependant DM  . Hypertension    . Cancer Father    No Known Allergies Prior to Admission medications   Medication Sig Start Date End Date Taking? Authorizing Provider  clonazePAM (KLONOPIN) 0.5 MG tablet Take 0.5 mg by mouth 3 (three) times daily.     Yes Historical Provider, MD  diltiazem (CARDIZEM CD) 240 MG 24 hr capsule TAKE 1 CAPSULE (240 MG TOTAL) BY MOUTH  DAILY. 06/25/13  Yes Thompson Grayer, MD  diltiazem (DILACOR XR) 240 MG 24 hr capsule Take 240 mg by mouth daily.   Yes Historical Provider, MD  FLUoxetine (PROZAC) 20 MG capsule Take 20 mg by mouth daily.     Yes Historical Provider, MD  hydrochlorothiazide (HYDRODIURIL) 25 MG tablet Take 0.5 tablets (12.5 mg total) by mouth daily. 06/15/13  Yes Thompson Grayer, MD  metFORMIN (GLUCOPHAGE) 500 MG tablet Take 500 mg by mouth daily.     Yes Historical Provider, MD  omeprazole (PRILOSEC) 20 MG capsule Take 20 mg by mouth 2 (two) times daily.     Yes Historical Provider, MD  Rivaroxaban (XARELTO) 20 MG TABS tablet Take 1 tablet (20 mg total) by mouth daily. 06/15/13  Yes Thompson Grayer, MD  simvastatin (ZOCOR) 10 MG tablet TAKE 1 TABLET BY MOUTH AT BEDTIME 07/03/13  Yes Thompson Grayer, MD     ROS: The patient denies fevers, chills, night sweats, unintentional weight loss, chest pain, palpitations,  wheezing, dyspnea on exertion, nausea, vomiting, abdominal pain, dysuria, hematuria, melena, numbness, weakness, or tingling.   All other systems have been reviewed and were otherwise negative with the exception of those mentioned in the HPI and as above.    PHYSICAL EXAM: Filed Vitals:   05/04/14 1020  BP: 138/80  Pulse: 76  Temp: 98.1 F (36.7 C)  Resp: 14   Filed Vitals:   05/04/14 1020  Height: 5\' 6"  (1.676 m)  Weight: 242 lb 9.6 oz (110.043 kg)   Body mass index is 39.18 kg/(m^2).  General: Alert, no acute distress HEENT:  Normocephalic, + wound on forehead, no eccymosis, oropharynx patent. EOMI, PERRLA, fundo exam normal Cardiovascular:  Regular rate and rhythm, no rubs murmurs or gallops.  No Carotid bruits, radial pulse intact. No pedal edema.  Respiratory: Clear to auscultation bilaterally.  No wheezes, rales, or rhonchi.  No cyanosis, no use of accessory musculature GI: No organomegaly, abdomen is soft and non-tender, positive bowel sounds.  No masses. Skin: No rashes. Neurologic: Facial musculature symmetric. CN 2-12 grossly nl, + tremors bialterally Psychiatric: Patient is appropriate throughout our interaction. Lymphatic: No cervical lymphadenopathy Musculoskeletal: Gait intact. + paramsk tenderness , trapezius and whomboids Decrease ROM in flexion due to pain, neg spurling Grip strength nl Thoracic spine nl Left ribs + tneder She has eccymosis on right ribs but not left., no pain or tenderness on right side. Low back- Normal 5/5 strength, 2/2 DTRs No saddle anesthesia Straight leg negative Hip and knee exam--normal    LABS: Results for orders placed in visit on 31/51/76  BASIC METABOLIC PANEL      Result Value Ref Range   Sodium 140  135 - 145 mEq/L   Potassium 4.1  3.5 - 5.1 mEq/L   Chloride 102  96 - 112 mEq/L   CO2 29  19 - 32 mEq/L   Glucose, Bld 125 (*) 70 - 99 mg/dL   BUN 11  6 - 23 mg/dL   Creatinine, Ser 0.7  0.4 - 1.2 mg/dL   Calcium 9.2   8.4 - 10.5 mg/dL   GFR 88.86  >60.00 mL/min  CBC WITH DIFFERENTIAL      Result Value Ref Range   WBC 8.1  4.5 - 10.5 K/uL   RBC 4.86  3.87 - 5.11 Mil/uL   Hemoglobin 13.1  12.0 - 15.0 g/dL   HCT 40.0  36.0 - 46.0 %   MCV 82.2  78.0 - 100.0 fl   MCHC 32.8  30.0 - 36.0 g/dL   RDW 14.6  11.5 - 14.6 %   Platelets 321.0  150.0 - 400.0 K/uL   Neutrophils Relative % 58.8  43.0 - 77.0 %   Lymphocytes Relative 27.5  12.0 - 46.0 %   Monocytes Relative 6.4  3.0 - 12.0 %   Eosinophils Relative 6.7 (*) 0.0 - 5.0 %   Basophils Relative 0.6  0.0 - 3.0 %   Neutro Abs 4.8  1.4 - 7.7 K/uL   Lymphs Abs 2.2  0.7 - 4.0 K/uL   Monocytes Absolute 0.5  0.1 - 1.0 K/uL   Eosinophils Absolute 0.5  0.0 - 0.7 K/uL   Basophils Absolute 0.1  0.0 - 0.1 K/uL     EKG/XRAY:   Primary read interpreted by Dr. Marin Comment at [facility]. Neg skull for fx  No obvious fractures or dislocation of c spine, significant DJD Neg ribs for fracture Neg chest xray for pneumo or infiltrates   ASSESSMENT/PLAN: Encounter Diagnoses  Name Primary?  . Acute post-traumatic headache, not intractable Yes  . Rib pain on left side   . Neck pain, bilateral    Neuro exam normal Precautions given for worsening neuro sxs She was advsie to got o ER is she has any abnormal mental status changes from baseline, normally sleepy so she has not had any increase changes Today's neuro exam was normal except for tremors which has been there ebefore and worsens when she has repeitive motion with her hands and flexes her neck for her job at work, Dr Marga Melnick takes care of her for this. She deneis any essetnail tremors and no family hx of parkinsosn, has neer seen neurology for this  C/w valium for msk spasms sicne she takes that at night, she can take it BID prn, tylenol and motrin for pain Work note given to return to work on Monday  Gross sideeffects, risk and benefits, and alternatives of medications d/w patient. Patient is aware that all medications have  potential sideeffects and we are unable to predict every sideeffect or drug-drug interaction that may occur.  LE, [place], DO 05/04/2014 11:55 AM

## 2014-05-09 ENCOUNTER — Ambulatory Visit (INDEPENDENT_AMBULATORY_CARE_PROVIDER_SITE_OTHER): Payer: 59 | Admitting: Family Medicine

## 2014-05-09 VITALS — BP 132/72 | HR 91 | Temp 98.3°F | Resp 18 | Ht 65.0 in | Wt 244.6 lb

## 2014-05-09 DIAGNOSIS — M542 Cervicalgia: Secondary | ICD-10-CM

## 2014-05-09 DIAGNOSIS — T07XXXA Unspecified multiple injuries, initial encounter: Secondary | ICD-10-CM

## 2014-05-09 DIAGNOSIS — T798XXS Other early complications of trauma, sequela: Secondary | ICD-10-CM

## 2014-05-09 DIAGNOSIS — T148XXA Other injury of unspecified body region, initial encounter: Secondary | ICD-10-CM

## 2014-05-09 DIAGNOSIS — T799XXS Unspecified early complication of trauma, sequela: Secondary | ICD-10-CM

## 2014-05-09 MED ORDER — CYCLOBENZAPRINE HCL 5 MG PO TABS: 5.0000 mg | ORAL_TABLET | Freq: Three times a day (TID) | ORAL | Status: DC | PRN

## 2014-05-09 MED ORDER — OXYCODONE-ACETAMINOPHEN 5-325 MG PO TABS: 1.0000 | ORAL_TABLET | Freq: Three times a day (TID) | ORAL | Status: DC | PRN

## 2014-05-09 MED ORDER — DOXYCYCLINE HYCLATE 100 MG PO TABS: 100.0000 mg | ORAL_TABLET | Freq: Two times a day (BID) | ORAL | Status: DC

## 2014-05-15 NOTE — Progress Notes (Signed)
Chief Complaint:  Chief Complaint  Patient presents with  . Follow-up    pain on the left side abdominal    HPI: Kayla Hendrix is a 63 y.o. female who is here for  Recheck after fall from porch 5 days ago, recheck  of her neck pain, her rib pain and also forehead. Went back to work and pain has not gotten better. She was already taking klonazepam but no relief. Head still hurts. Deneis new neuro sxs.    Last ov 5 days ago: Kayla Hendrix is a 63 y.o. female who is here for Head pain, neck pain, shoulder pain, and also rib pain on the left side after falling off her porch Yesterday evening while beding over her wood porch to feed some neighborhood cats , she thinks she may have tripped over a nail and then went down on the gorund, she thinks the fall was about 3 feet high, she hit her head but did not have any LOC. She has forehead pain, has HA but no LOC, Doubles vision, nausea, vomiting, abd pain. She does not have SOB but does have CP with deep breaths on the left side. She is on asa 81 mg for PAF but is not on any other blood thinners. She has a h/o PAF and they put her on xarelto for this but she could nt tolerate due to HAs. She has chronic h/o upper back and neck pan, shoudler pain and spinal stenosis. Followed by Dr Delilah Shan. She denies osteoporosis or osteopenia. She has had tremores int he past and it gets worse when she does a lot of looking down or repetitive mption with her job, her tremors have gotten worse since she started this new job, her currently supervisor is not very understanding per patient. No h.o parkinsons/carpal tunnel, numbness, wekaness or tingling. + stiffness, pain    Past Medical History  Diagnosis Date  . OSA (obstructive sleep apnea)     noncompliant with CPAP  . Glucose intolerance (impaired glucose tolerance)   . Atrial fibrillation     paroxysmal only asa 81 mg, no other blood thinner, the xarelto she was on gave her HAs  . Obesity   . HL  (hearing loss)   . HTN (hypertension)   . GERD (gastroesophageal reflux disease)   . CAD (coronary artery disease)     cath 2006- nonobstructive CAD  . Depression   . Diabetes mellitus without complication    Past Surgical History  Procedure Laterality Date  . Total abdominal hysterectomy    . Cesarean section     History   Social History  . Marital Status: Single    Spouse Name: N/A    Number of Children: N/A  . Years of Education: N/A   Social History Main Topics  . Smoking status: Former Research scientist (life sciences)  . Smokeless tobacco: Never Used     Comment: 15-pack-year smoker  . Alcohol Use: No  . Drug Use: No  . Sexual Activity: None   Other Topics Concern  . None   Social History Narrative   Pt. Lives in [place] with her son. 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    Family History  Problem Relation Age of Onset  . Diabetes      noninsulin dependant DM  . Hypertension    . Cancer Father    Allergies  Allergen Reactions  .  Hydrocodone Itching   Prior to Admission medications   Medication Sig Start Date End Date Taking? Authorizing Provider  clonazePAM (KLONOPIN) 0.5 MG tablet Take 0.5 mg by mouth 3 (three) times daily.     Yes Historical Provider, MD  diltiazem (CARDIZEM CD) 240 MG 24 hr capsule TAKE 1 CAPSULE (240 MG TOTAL) BY MOUTH DAILY. 06/25/13  Yes Thompson Grayer, MD  diltiazem (DILACOR XR) 240 MG 24 hr capsule Take 240 mg by mouth daily.   Yes Historical Provider, MD  FLUoxetine (PROZAC) 20 MG capsule Take 20 mg by mouth daily.     Yes Historical Provider, MD  hydrochlorothiazide (HYDRODIURIL) 25 MG tablet Take 0.5 tablets (12.5 mg total) by mouth daily. 06/15/13  Yes Thompson Grayer, MD  metFORMIN (GLUCOPHAGE) 500 MG tablet Take 500 mg by mouth daily.     Yes Historical Provider, MD  mupirocin ointment (BACTROBAN) 2 % Apply to affected area BID x 5 days 05/04/14  Yes Kathlene Yano P Colt Martelle, DO  omeprazole  (PRILOSEC) 20 MG capsule Take 20 mg by mouth 2 (two) times daily.     Yes Historical Provider, MD  simvastatin (ZOCOR) 10 MG tablet TAKE 1 TABLET BY MOUTH AT BEDTIME 07/03/13  Yes Thompson Grayer, MD  cyclobenzaprine (FLEXERIL) 5 MG tablet Take 1 tablet (5 mg total) by mouth 3 (three) times daily as needed for muscle spasms. 05/09/14   Mashanda Ishibashi P Lakeem Rozo, DO  doxycycline (VIBRA-TABS) 100 MG tablet Take 1 tablet (100 mg total) by mouth 2 (two) times daily. 05/09/14   Nakayla Rorabaugh P Jernie Schutt, DO  oxyCODONE-acetaminophen (ROXICET) 5-325 MG per tablet Take 1 tablet by mouth every 8 (eight) hours as needed for severe pain. 05/09/14   Edel Rivero P Sheneika Walstad, DO     ROS: The patient denies fevers, chills, night sweats, unintentional weight loss, chest pain, palpitations, wheezing, dyspnea on exertion, nausea, vomiting, abdominal pain, dysuria, hematuria, melena, numbness, weakness, or tingling.   All other systems have been reviewed and were otherwise negative with the exception of those mentioned in the HPI and as above.    PHYSICAL EXAM: Filed Vitals:   05/09/14 1135  BP: 132/72  Pulse: 91  Temp: 98.3 F (36.8 C)  Resp: 18   Filed Vitals:   05/09/14 1135  Height: 5\' 5"  (1.651 m)  Weight: 244 lb 9.6 oz (110.95 kg)   Body mass index is 40.7 kg/(m^2).  General: Alert, no acute distress HEENT:  Normocephalic, atraumatic, oropharynx patent. EOMI, PERRLA, + puncture wound from head, whoch we both thought was a scab and when I tried to get scab with pickups since it was draining, it turned out to be a small piece of wood.  Cardiovascular:  Regular rate and rhythm, no rubs murmurs or gallops.  No Carotid bruits, radial pulse intact. No pedal edema.  Respiratory: Clear to auscultation bilaterally.  No wheezes, rales, or rhonchi.  No cyanosis, no use of accessory musculature GI: No organomegaly, abdomen is soft and non-tender, positive bowel sounds.  No masses. Skin: No rashes. Neurologic: Facial musculature symmetric. Psychiatric:  Patient is appropriate throughout our interaction. Lymphatic: No cervical lymphadenopathy Musculoskeletal: Gait intact. + paramsk tenderness , trapezius  bialteral  Decrease ROM in flexion due to pain, neg spurling  Grip strength nl  Thoracic spine nl  Left ribs + tender  Low back-  Normal  5/5 strength, 2/2 DTRs  No saddle anesthesia  Straight leg negative  Hip and knee exam--normal    LABS: Results for orders placed in visit on 06/15/13  BASIC METABOLIC PANEL      Result Value Ref Range   Sodium 140  135 - 145 mEq/L   Potassium 4.1  3.5 - 5.1 mEq/L   Chloride 102  96 - 112 mEq/L   CO2 29  19 - 32 mEq/L   Glucose, Bld 125 (*) 70 - 99 mg/dL   BUN 11  6 - 23 mg/dL   Creatinine, Ser 0.7  0.4 - 1.2 mg/dL   Calcium 9.2  8.4 - 10.5 mg/dL   GFR 88.86  >60.00 mL/min  CBC WITH DIFFERENTIAL      Result Value Ref Range   WBC 8.1  4.5 - 10.5 K/uL   RBC 4.86  3.87 - 5.11 Mil/uL   Hemoglobin 13.1  12.0 - 15.0 g/dL   HCT 40.0  36.0 - 46.0 %   MCV 82.2  78.0 - 100.0 fl   MCHC 32.8  30.0 - 36.0 g/dL   RDW 14.6  11.5 - 14.6 %   Platelets 321.0  150.0 - 400.0 K/uL   Neutrophils Relative % 58.8  43.0 - 77.0 %   Lymphocytes Relative 27.5  12.0 - 46.0 %   Monocytes Relative 6.4  3.0 - 12.0 %   Eosinophils Relative 6.7 (*) 0.0 - 5.0 %   Basophils Relative 0.6  0.0 - 3.0 %   Neutro Abs 4.8  1.4 - 7.7 K/uL   Lymphs Abs 2.2  0.7 - 4.0 K/uL   Monocytes Absolute 0.5  0.1 - 1.0 K/uL   Eosinophils Absolute 0.5  0.0 - 0.7 K/uL   Basophils Absolute 0.1  0.0 - 0.1 K/uL     EKG/XRAY:   Primary read interpreted by Dr. Marin Comment at [facility].   ASSESSMENT/PLAN: Encounter Diagnoses  Name Primary?  . Sprain and strain Yes  . Multiple contusions   . Post-traumatic wound infection, sequela   . Neck pain    RX roxicet, has had problems with hydrocodone but not oxycodone but not sure She wants to try it since her pain is not getting better, rx flexeril prn  As well. Advise not to take all sedating meds  together, need to space it out Also she was rx doxycycline for wound forehead, + puncture wound from head, which we both thought was a scab and when I tried to get scab off with pickups since it was draining to see what was underneath, it turned out to be a small piece of wood. She states she has well controlled DM I advise her to go back to Dr Delilah Shan for evaluation since he sees her for all of her joint pain prn but specifically for her neck problems, she ahs signigicant DJD and has worsening tremors with the type of repetitive motion she does at work.   Gross sideeffects, risk and benefits, and alternatives of medications d/w patient. Patient is aware that all medications have potential sideeffects and we are unable to predict every sideeffect or drug-drug interaction that may occur.  Liela Rylee, [place], DO 05/15/2014 12:10 PM  Attempted to call and see how patient was doing but no answer. Unable to leave message

## 2014-06-24 ENCOUNTER — Other Ambulatory Visit: Payer: Self-pay | Admitting: Internal Medicine

## 2014-08-25 ENCOUNTER — Other Ambulatory Visit: Payer: Self-pay

## 2015-03-04 NOTE — Consult Note (Signed)
PATIENT NAME:  Kayla Hendrix, Kayla Hendrix MR#:  [id number] DATE OF BIRTH:  13-Sep-1951  DATE OF CONSULTATION:  04/09/2012  REFERRING PHYSICIAN:   CONSULTING PHYSICIAN:  Muhammad A. Fletcher Anon, MD  PRIMARY CARE PHYSICIAN:  Dr. Jani Gravel, [facility] PRIMARY CARDIOLOGIST: Dr. Thompson Grayer   REASON FOR CONSULTATION: Syncope.   HISTORY OF PRESENT ILLNESS: This is a 64 year old female with known history of paroxysmal atrial fibrillation. She has been mostly maintained in sinus rhythm without significant recurrent episodes. She was in her usual health up until last evening when she had a syncopal episode while she was in a restaurant having dinner with her son. After she ate, the patient had some itching and tingling in her hands and legs. She felt the urge to use the bathroom. She went to the bathroom and she a diarrheal episode. After she finished and was trying to stand up, she became dizzy and passed out and fell on the floor. She was found on the floor in the bathroom by her son who called EMS. When EMS arrived, the patient was flushed and was thought to have an allergic reaction. She was given Benadryl. She was noted to be hypotensive with a systolic blood pressure in the 70s. She was still hypotensive in the Emergency Room and was given 4 liters of normal saline. The patient denies any previous similar episodes. She reports no previous syncope. She did not have any chest pain or shortness of breath. Her cardiac enzymes were minimally elevated but subsequently went down. Her ECG showed no acute changes. Telemetry shows no arrhythmic events. She is currently feeling back to her baseline. However, this afternoon she noticed some blood in the stool, which is new. She is not aware of previous history of GI bleed.   PAST MEDICAL HISTORY:  1. Type 2 diabetes.  2. Hypertension.  3. Hyperlipidemia.  4. Obstructive sleep apnea.  5. Gastroesophageal reflux disease.  6. History of paroxysmal atrial fibrillation maintaining  in sinus rhythm. She is on aspirin and no other anticoagulation.    PAST SURGICAL HISTORY:  1. Hysterectomy. 2. Wrist surgery.   SOCIAL HISTORY: She lives with her son. She quit smoking many years ago. She denies any alcohol or recreational drug use.   FAMILY HISTORY: Family history is remarkable for coronary artery disease in both parents.   ALLERGIES: No known drug allergies.   HOME MEDICATIONS:  1. Diltiazem twice daily.  2. Aspirin 81 mg daily.  3. Klonopin twice daily.  4. Omeprazole 20 mg daily.  5. Glucophage.   REVIEW OF SYSTEMS: A 10-point review of systems was performed. It is negative other than what is mentioned in the history of present illness.   PHYSICAL EXAMINATION:  GENERAL: The patient appears to be at her stated age in no acute distress.   VITAL SIGNS: Temperature 99.6, pulse 94, respiratory rate 18, blood pressure is 130/67, oxygen saturation is 96% on 2 liters nasal cannula.   HEENT: Normocephalic, atraumatic.   NECK: No jugular venous distention or carotid bruits.   RESPIRATORY: Normal respiratory effort with no use of accessory muscles. Auscultation reveals normal breath sounds.   CARDIOVASCULAR: Normal PMI. Normal S1 and S2 with no gallops or murmurs.   ABDOMEN: Benign, nontender, and nondistended.   EXTREMITIES: No clubbing, cyanosis, or edema.   SKIN: Warm and dry with no rash.   PSYCHIATRIC: She is alert, oriented times three with normal mood and affect.   LABORATORY, DIAGNOSTIC, AND RADIOLOGICAL DATA: Her initial troponin was negative but subsequently was  slightly elevated at 0.06 and went back to normal. Electrolytes and renal function were unremarkable. Urine drug screen was negative. CBC showed white cell count of 13,000 with a hemoglobin of 15.5. ECG showed sinus rhythm with incomplete right bundle branch block and isolated Q waves in lead III which is likely a normal variant. No significant ST or T wave changes.   IMPRESSION:  1. Syncope,  likely neurocardiogenic syncope. No evidence of arrhythmia.  2. Mildly elevated cardiac enzymes, likely supply/demand ischemia. No evidence of myocardial infarction.  3. Paroxysmal atrial fibrillation, currently maintaining in sinus rhythm with no evidence of recurrent arrhythmia.  4. Possible lower gastrointestinal bleed.   RECOMMENDATIONS: The patient's syncopal episode seems to be neurocardiogenic in nature. There has been no evidence of arrhythmia and no evidence of acute ischemic heart disease. Her troponin was minimally elevated, likely due to supply/demand ischemia in the setting of hypotension. Her echocardiogram showed normal LV systolic function and wall motion without significant valvular abnormalities. I recommend pharmacologic nuclear stress test, which can be done as an outpatient if the patient is discharged home. However, she reported some blood in the stool this afternoon. If her CBC shows significant drop in her hemoglobin, she might need to be evaluated by gastroenterology. If the patient stays in the hospital over the weekend, the stress test can be ordered on Monday.   ____________________________ Mertie Clause. Fletcher Anon, MD [id number] D:  04/09/2012 17:48:04 ET         T: 04/10/2012 10:20:55 ET         JOB#: [id number]  cc: Rogue Jury A. Fletcher Anon, MD, <Dictator> Nelda Severe. Allred, MD Dr. Jani Gravel, [facility] Ferne Reus MD ELECTRONICALLY SIGNED 04/20/2012 13:10

## 2015-03-04 NOTE — Consult Note (Signed)
Brief Consult Note: Diagnosis: Vasovagal syncope, p. A-fib in sinus rhythm, Supply demand ischemia.   Discussed with Attending MD.   Comments: No evidence of arrhythmia or MI.  Tele is fine.  Echo was unremarkable.  Ok to discharge home from a cardiac standpoint. I will arrange for a stress test in our office and follow up with Dr. Rayann Heman.  Electronic Signatures: Kathlyn Sacramento (MD)  (Signed 31-May-13 14:09)  Authored: Brief Consult Note   Last Updated: 31-May-13 14:09 by Kathlyn Sacramento (MD)

## 2015-03-04 NOTE — Consult Note (Signed)
Brief Consult Note: Diagnosis: hematochezia.   Patient was seen by consultant.   Consult note dictated.   Recommend to proceed with surgery or procedure.   Orders entered.   Discussed with Attending MD.   Comments: Patient admitted with syncopal episode.  Cardiology evaluation noted.  Patient with episode of rectal bleeding that started during hospitalization.  Will do flexible sigmoidoscopy.  I have discussed the risks benefits and complications of this proceedure to include not limited to bleeding infection perforation and sedation and she wishes to proceed.  Electronic Signatures: Loistine Simas (MD)  (Signed 01-Jun-13 11:34)  Authored: Brief Consult Note   Last Updated: 01-Jun-13 11:34 by Loistine Simas (MD)

## 2015-03-04 NOTE — H&P (Signed)
PATIENT NAME:  Kayla Hendrix, Kayla Hendrix MR#:  [id number] DATE OF BIRTH:  Apr 04, 1951  DATE OF ADMISSION:  04/09/2012  PRIMARY CARE PHYSICIAN:  Dr. Jani Gravel, [facility] ER PHYSICIAN:  Dr. Roxine Caddy ADMITTING PHYSICIAN: Dr. Pearletha Furl  PRESENTING COMPLAINT: Altered mental status.  HISTORY OF PRESENT ILLNESS:  The patient is a 64 year old lady who was in her usual state of health until this evening when she passed out at a restaurant having dinner with her son. The patient states she was feeling very well when she started itching of her hands and legs. She stated that this happened about six weeks ago and was soon followed by diarrhea and relieved by  Benadryl. In view of the history, she started hurrying to the bathroom because she felt diarrhea was soon coming on, and on getting to the bathroom noticed that her hands and legs were all red. Next she had some diarrhea and passed out.  The patient was found on the floor in the bathroom by her son, who was anxious and followed after her. She was confused during this period at which time EMS was activated. EMS arrived, felt she was flushed all over, and for possible allergic reaction gave her Benadryl. Itching in the hands and legs stopped. However, she was noted to be hypotensive with systolic blood pressures in the 70s, for which she was brought to the Emergency Room. Here blood pressure persisted in the 70s and only resolved after about 4 liters of normal saline were given. The patient has been having episodes of diarrhea since arrival here in the hospital with about three episode. Diarrhea was large volume, nonbloody. Denies any abdominal pain. Denies any chest pain. No PND, orthopnea, or pedal edema. No recent change in medication or sick contacts. No rashes on the chest. She denies any dysuria but admits to increasing frequency. No vomiting or diarrhea prior to arrival here in the hospital today. She has not had any vomiting. Denies any abdominal pain. The patient does  not know what she is allergic to, but has noted that sometimes her hands turn red associated with itching, and this was soon followed by a bowel movement. Denies any palpitations or chest pain. No headache or slurred speech noted. However, she was confused after she was picked up from the floor this evening.   REVIEW OF SYSTEMS: CONSTITUTIONAL: Positive for fatigue. Denies any weight loss, weight gain, or fever.  EYES: Denies any blurred vision, redness, or discharge. ENT: No tinnitus, hearing loss, or epistaxis. No redness of the mouth or difficulty swallowing. RESPIRATORY: Denies any cough or wheezing. CARDIOVASCULAR: Denies any chest pain, orthopnea, or exertional dyspnea. No palpitations, but had syncope today. GI: Denies any nausea. No vomiting, but has diarrhea today. Denies any abdominal pain. No hemorrhoids or hernias. Has increased bowel habit today. GU: No dysuria. Positive for frequency but no incontinence.  ENDOCRINE: No polyuria. Admits excessive thirst but no heat or cold intolerance. HEMATOLOGIC: No anemia, easy bruising, bleeding, or swollen glands. SKIN: Redness in her hands and legs early this afternoon but those symptoms have resolved. No change in hair or skin texture. MUSCULOSKELETAL: Denies any joint pain, redness, or limited activity. NEUROLOGIC: Denies any numbness, vertigo, headaches, seizure, or memory loss. PSYCH: She has some anxiety but no depression or nervousness.   PAST MEDICAL HISTORY:  1. Type 2 diabetes.  2. Hypertension.  3. Hyperlipidemia.  4. Obstructive sleep apnea, noncompliant with CPAP. 5. History of gastroesophageal reflux disease. 6. History of paroxysmal atrial fibrillation on rate control  with diltiazem and aspirin for anticoagulation.   PAST SURGICAL HISTORY:  1. Hysterectomy.  2. Orthopedic surgeries to both wrists.   SOCIAL HISTORY: Lives with her son. 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  Reformed smoker, quit two years ago after a 30  pack-year history. No other recreational drug use.   FAMILY HISTORY: Positive for coronary artery disease in both parents, CVA, hypertension, type 2 diabetes, also positive for stomach cancer in the father.   ALLERGIES: No known drug allergies.   MEDICATIONS:  She does not know her medications.  1. She knows that she is on diltiazem unknown dose twice daily.  2. Aspirin 81 mg daily.  3. Klonopin 0.05 mg twice daily. 4. Some medication for cholesterol.  5. Omeprazole 20 mg daily.  6. Glucophage 500 mg daily.   PHYSICAL EXAMINATION:  VITAL SIGNS: Temperature 97.4, pulse 83, respiration rate 17, blood pressure on arrival 77/45, currently is 122/43, oxygen saturation 92% on room air. The patient is orthostatic negative.   GENERAL: Elderly lady, lying on the gurney, awake, alert, oriented to time, place, and person, in no overt distress. The patient's son is at the bedside.   HEENT: Atraumatic, normocephalic. Pupils equal, reactive to light and accommodation. Extraocular movements intact. Mucous membranes pink, moist.   NECK: Supple. No JV distention.   CHEST: Good air entry. Clear to auscultation.   HEART: Regular rate and rhythm. No murmurs.   ABDOMEN: Full, moves with respiration, nontender. Bowel sounds normoactive. No organomegaly.   EXTREMITIES: No edema, clubbing, or deformity.   NEUROLOGICAL: Cranial nerves II through XII grossly intact. No focal deficits. Affect appropriate to situation.   LABORATORY, DIAGNOSTIC, AND RADIOLOGICAL DATA: EKG showed normal sinus rhythm, rate of 85, incomplete right bundle branch block. No acute ST-T wave changes. ABG: pH 7.29, pCO2 39, pO2 69, bicarbonate 18, sats of 94% on room air on arrival. CT of the head showed no acute abnormalities. C-spine CT showed no acute abnormality. CT of the chest, abdomen, and pelvis shows mild bowel wall thickening of most of the small bowel and mild bowel wall thickening of most of the colon.  Differential includes  bowel ischemia as well as an infectious or inflammatory etiology, but the diffuse bowel wall thickening can be seen in the setting of hypovolemic shock. It also showed cholelithiasis, coronary artery calcification, indeterminate pulmonary nodules 6 millimeters or less. May need followup in 6 to 12 months with noncontrast CT of the chest. Small amount of mural plaque in aortic tract which extends intraluminally.  CBC: White count is 13, hemoglobin 15, platelets 397. No differential. Sodium 138, potassium 3.5, bicarbonate 20, creatinine 1.14, BUN 13, glucose 208, calcium 8.6, anion gap 18. Normal LFTs. Troponin negative. Alcohol level less than 3. Urine drug screen is negative. INR 1 with PT of 13. Urinalysis is positive for leukocyte esterase 2+, white cells 103 per high-power field, and trace bacteria. Red blood cells about 13, nitrites negative.   IMPRESSION:  1. Syncope, query cause for differential including arrhythmia versus possible orthostatic hypotension versus seizures versus allergic reaction.  Possible vagal response.  2. Transient hypotension, likely from hypovolemic shock, now resolved.  3. Altered mental status likely from the hypovolemia and hypotension, now resolved.   4. Urinary tract infection.  5. Obstructive sleep apnea, noncompliant with CPAP.  6. Anxiety, stable. 7. Gastroesophageal reflux disease, stable.  8. Type 2 diabetes, stable.  9. Hypertension, currently hypotensive.  10. Incidental sub-cm  pulmonary nodule.  11. Possible gastroenteritis.  PLAN: The patient's  myriad of symptoms could all be related to a vagal response from possible gastroenteritis versus arrhythmia. Symptoms have improved; however, she is still having increased bowel movements at this time. We will admit to general medical on the telemetry floor under observation for IV fluids for rehydration, telemonitoring, and echocardiogram in the a.m. Check TSH, magnesium, and serial cardiac enzymes. Orthostatic  vitals each shift. Check lactic acid. Check stool for C. difficile, culture,  and for WBC.  Hold off on the Glucophage, which can cause diarrhea. Sliding scale insulin supplement for blood sugar control. The patient is encouraged on CPAP use at night. Continue with PPI and Lovenox for GI and deep vein thrombosis prophylaxis. Start the patient on Rocephin for urinary tract infection, p.r.n. Benadryl for possible allergic response.  CODE STATUS: FULL CODE.  TOTAL PATIENT CARE TIME: 50 minutes.   ____________________________ Jules Husbands Pearletha Furl, MD mia:bjt D: 04/09/2012 00:02:39 ET T: 04/09/2012 07:43:59 ET JOB#: [id number]  cc: Dr. Jani Gravel, Carolan Shiver MD ELECTRONICALLY SIGNED 04/09/2012 22:31

## 2015-03-04 NOTE — Discharge Summary (Signed)
PATIENT NAME:  Kayla Hendrix, Kayla Hendrix MR#:  [id number] DATE OF BIRTH:  Feb 06, 1951  DATE OF ADMISSION:  04/09/2012 DATE OF DISCHARGE:  04/11/2012  PRIMARY CARE PHYSICIAN: Dr. Jani Gravel in [place]: Altered mental status.   DISCHARGE DIAGNOSES:  1. Altered mental status and syncope likely from hypotension. 2. Hypotension. 3. Elevated cardiac enzymes, likely demand ischemia from hypotension. 4. Rectal bleeding with probable ischemic colitis from hypotension.  5. Paroxysmal atrial fibrillation with history of cardioversion in the past.  6. History of diabetes mellitus. 7. History of obstructive sleep apnea on continuous positive airway pressure. 8. Urinary tract infection. 9. Gastroesophageal reflux disease. 10. Chronic diarrhea likely inflammatory or irritable bowel syndrome.  11. Incidental subcentimeter nodules 6 mm seen on computed tomography of the chest for which patient will need an outpatient computed tomography scan in 6 to 12 months to ensure stability.  12. Anemia probably from lower gastrointestinal bleeding with ischemic colitis and also dilutional component.  13. Mild diastolic dysfunction noted on two dimensional echocardiogram during this admission with no evidence of pulmonary hypertension or aortic stenosis and patient was found to have preserved left ventricular ejection fraction of greater than 55%. 14. Dyslipidemia with hypertriglyceridemia.   CONSULTATIONS:  1. GI with Dr. Gustavo Lah.  2. Cardiology with Dr. Fletcher Anon.   DISCHARGE DISPOSITION: Home.    DISCHARGE MEDICATIONS:  1. Cipro 500 mg p.o. every 12 hours x7 days. 2. Flagyl 250 mg p.o. q.i.d. x7 days. 3. Cardizem 30 mg p.o. every 12 hours.  4. Metformin 500 mg p.o. daily.  5. Omeprazole 20 mg p.o. b.i.d. 6. Simvastatin 10 mg p.o. daily. 7. Clonazepam 0.5 mg p.o. t.i.d.  8. Prozac 20 mg p.o. daily.  9. Aspirin 81 mg p.o. daily.   DISCHARGE INSTRUCTIONS:  1. Take medications as prescribed.    2. Return to Emergency Department for recurrence of symptoms or for feeling faint, lightheaded, dizzy, weak or for any blood noted in your stool or for any chest pain, shortness of breath, fever, or chills. Also return to the Emergency Department for any numbness, tingling, facial droop, or difficulty speaking or chewing or swallowing.  3. Do not take hydrochlorothiazide, HCTZ or lisinopril until further advised by your primary care physician and your diltiazem dose has also been reduced since your blood pressure was running low earlier.  4. Continue nocturnal CPAP for obstructive sleep apnea.   DISCHARGE DIET: Full liquid diet x2 days then low residue discharge diet x1 week then advance to low sodium, ADA, low fat, low cholesterol, low triglyceride diet as tolerated thereafter.   DISCHARGE ACTIVITY: As tolerated.   FOLLOW UP INSTRUCTIONS:  1. Follow up with your primary care physician, Dr. Nyoka Cowden, in 1 to 2 weeks. Patient needs repeat blood pressure check within one week, also repeat CBC within one week.  2. Follow up with Dr. Fletcher Anon within 2 to 3 weeks. 3. Follow up with Dr. Gustavo Lah within 10 to 14 days.   LABORATORY, DIAGNOSTIC AND RADIOLOGICAL DATA:  Noncontrast head CT 04/08/2012: No acute intracranial abnormalities are noted.   CT of the C-spine without contrast 04/08/2012: No cervical spine fracture or static listhesis is noted. Ligamentous injury cannot be excluded.   CT of the chest, abdomen and pelvis 04/08/2012: Mild bowel wall thickening of most of the loops of the small bowel, mild bowel wall thickening of most of the colon, however, some of the colonic wall thickening may be related to underdistention. Differential diagnoses would include bowel ischemia versus infectious  or inflammatory etiologies, diffuse bowel wall thickening can be seen in the setting of hypovolemic shock. There is also cholelithiasis. There is coronary artery calcification. Indeterminate pulmonary nodules which  are 6 mm or less. Small amount of mural plaque in the aortic arch which extends intraluminally.   Portable chest x-ray 04/08/2012: No acute cardiopulmonary abnormalities are noted.   Right hand x-ray 04/09/2012: No acute fracture seen. No soft tissue foreign body is observed. No periosteal reactive change or other significant osseous abnormalities identified.   Flexible sigmoidoscopy 04/10/2012 by Dr. Gustavo Lah: There is patchy moderate inflammation found secondary to ischemic colitis which was biopsied. Patient will follow up with GI as an outpatient to obtain pathology report.   2-D echocardiogram 79/12/4095: LV systolic function normal, ejection fraction greater than 35%, mild diastolic dysfunction. RV systolic function normal. No evidence of pulmonary hypertension. No hemodynamically significant valvular aortic stenosis.   Urinalysis the time of admission: Cloudy urine with 2+ leukocyte esterase, 103 WBCs and trace bacteria, negative nitrite.  Blood cultures x2 from 04/08/2012 no growth to date.   CBC on admission: WBC 13.3, hemoglobin 15.5, hematocrit 47.8, platelets 397. CBC on the day of discharge: WBC 8.1, hemoglobin 10.6, hematocrit 31.3, platelets 264. Cardiac enzymes on admission negative, second set troponin 0.06, third troponin less than 0.02, fourth troponin less than 0.02. All of her CK levels are within normal limits.   LFTs within normal limits except for total protein slightly low at 6.1 and serum albumin slightly low at 3.3.   Hemoglobin A1c 5.7.   LDL 79 with total cholesterol 159, triglycerides 230, HDL 34.    Renal function on admission: BUN 13, creatinine 1.14. BUN 10, creatinine 0.83 from 04/10/2012.  BRIEF HISTORY/HOSPITAL COURSE: Patient is a 64 year old female with past medical history of hypertension, hyperlipidemia, non-insulin-dependent diabetes mellitus, history of paroxysmal atrial fibrillation status post cardioversion (twice 1999 and 2010) not on any   anticoagulation at baseline and history of gastroesophageal reflux disease who was brought to the ER after a syncopal episode and found to be hypotensive on admission. Please see dictated admission history and physical for pertinent details surrounding the onset of this hospitalization and please see below for further details. 1. Altered mental status and syncope-Likely form hypotension and probable orthostasis from hypovolemia versus vasovagal and less likely to be from cardiac or neurologic source. Noncontrast head CT was negative for acute intracranial abnormalities. Echocardiogram was obtained revealing some diastolic dysfunction but it was felt that this was not accounting for her syncope. Telemetry monitoring was benign. In regards to her hypotension, blood pressure medications were initially held and she was hydrated with IV fluids with improvement of blood pressure thereafter. She had no witnessed syncopal episodes during this hospitalization. She did have some elevated troponin and this was felt to be demand ischemia from hypotension per cardiology rather than acute coronary syndrome. Troponins have downtrended. Echocardiogram was negative for wall motion abnormalities. She was seen by cardiology and Dr. Fletcher Anon recommended that no further cardiac diagnostics should be performed at this time. Initially she was placed on full dose anticoagulation given elevated troponin with syncope, but since ACS was felt to be less likely anticoagulation was stopped as per Dr. Tyrell Antonio recommendations. She does have some risk factors for coronary artery disease including hypertension, hyperlipidemia and diabetes mellitus and also history of paroxysmal atrial fibrillation and patient will follow up with Dr. Fletcher Anon as an outpatient in this regard for management of atrial fibrillation and also for ischemic work up and evaluation.  2. Rectal bleeding-With some drop of her hemoglobin and hematocrit. Patient had hemodynamically  stabilized with IV fluids. This led to improvement of her blood pressure. Thereafter she was asymptomatic. She did have some drop of her hemoglobin and hematocrit which could have been dilutional versus secondary to rectal bleeding but since she had hemodynamically stabilized and remained asymptomatic after IV fluids there were no acute indications for blood transfusions. She will need close monitoring of her hemoglobin and hematocrit as an outpatient by her primary care physician and will need to follow up with GI as an outpatient as well. Was also advised to return to the ER if she notices any significant rectal bleeding or she becomes dizzy, lightheaded or feels faint. In regards to her rectal bleeding and anemia, she did undergo flexible sigmoidoscopy and there were findings suggestive of ischemic colitis and biopsies were taken and patient will follow up with Dr. Gustavo Lah as an outpatient to obtain pathology reports. She likely had ischemic colitis from hypotensive state and poor vascular flow to her GI tract as a result and bowel wall thickening was also evident on CT scan. In regards to hypotension, this could be hypovolemic in nature from diarrhea prior to admission and after holding her blood pressure medications and hydrating her with IV fluids her blood pressure has stabilized and improved and she is no longer hypotensive at the time of discharge. Her blood pressure close to her hospital discharge did start to rise some but since she came in hypotensive her blood pressure medications have been adjusted and she is advised to stop and no longer take her HCTZ or lisinopril and her Cardizem dose has been reduced and she will need close outpatient follow up with her primary care physician in regards to hypertension management.  3. History of paroxysmal atrial fibrillation-Status post cardioversion in the past x2 for which patient follows with [facility] in [place] and she will continue Cardizem for  heart rate control and she is not on anticoagulation at baseline and will continue aspirin for stroke prophylaxis as she was doing before and follow up with [facility] as an outpatient. Overall Dr. Fletcher Anon was in agreement with this plan and for management of her paroxysmal atrial fibrillation.  4. Diabetes mellitus-Initially her metformin was held since she received contrast CT but it has now been 72 hours since that time and she was instructed to resume her metformin at the time of discharge. Overall her sugars have remained well controlled during this hospitalization. She was placed on sliding scale insulin as well but sugars remained well controlled without the use of insulin products.  5. Urinary tract infection-Based off abnormal urinalysis. Blood cultures did not reveal any growth to date and urine culture was not collected initially and she was placed on IV Rocephin. There is no evidence of sepsis or systemic inflammatory response syndrome and antibiotics are being changed to Levaquin at the time of discharge for which she is advised to complete a one week course.  6. Gastroesophageal reflux disease-Patient to continue PPI therapy and follow up with GI as an outpatient. .  7. Incidental subcentimeter 6 mm pulmonary nodule for which she will need repeat chest CT in 6 to 12 months per primary care physician to ensure stability. 8. Obstructive sleep apnea. Patient is advised to continue nocturnal CPAP.  9. On 04/11/2012 patient was hemodynamically stable with improvement of her blood pressure, was no longer hypotensive and did not have any witnessed syncopal episodes during this hospitalization and  was mentating at baseline with resolution of her altered mental status and she was felt to be stable for discharge home with close outpatient follow up to which patient was agreeable.      TIME SPENT ON DISCHARGE: Greater than 30 minutes.    ____________________________ Romie Jumper,  MD knl:cms D: 04/14/2012 19:26:56 ET T: 04/15/2012 11:05:00 ET JOB#: [id number]  cc: Romie Jumper, MD, <Dictator> Lollie Sails, M.D. Dr. Jani Gravel, [facility] Stephone Gum MD ELECTRONICALLY SIGNED 04/26/2012 2:25

## 2015-03-04 NOTE — Consult Note (Signed)
PATIENT NAME:  Kayla Hendrix, Kayla Hendrix MR#:  [id number] DATE OF BIRTH:  December 17, 1950  DATE OF CONSULTATION:  04/10/2012  REFERRING PHYSICIAN:   CONSULTING PHYSICIAN:  Lollie Sails, MD  REASON FOR CONSULTATION: Rectal bleeding.   HISTORY OF PRESENT ILLNESS: [name] is a 64 year old Caucasian female who was admitted to the hospital on 04/09/2012. At that time she had presented with a syncopal episode. She stated that she was out to eat. Occasionally she will get some fecal urgency after eating and when she went to the bathroom the next thing she remembered was she was waking up. She states that her hands and feet got quite itchy and this will occur at times with these episodes of syncope. She was seen during this hospitalization by Dr. Fletcher Anon of cardiology as well as [facility]. The diagnosis from cardiology was vasovagal syncope with history of atrial fibrillation, now in sinus rhythm. Lab changes were attributed to supply/demand ischemia. Echocardiogram was unremarkable. The patient was felt okay to discharge home from a cardiac standpoint and a stress test would be arranged and followup with her primary cardiologist, Dr. Rayann Heman. It is of note that yesterday she had an episode of rectal bleeding and her discharge was delayed. This had been a small amount, maroon to red in color. She has had several episodes of small volume rectal bleeding since then, has not passed any stools since the first stool she had in the hospitalization. She denies any nausea, vomiting, or abdominal pain. There is no heartburn or dysphagia. She is having a bowel movement daily. There are no black stools, blood in the stools, or slimy stools on a regular basis. She has never had rectal bleeding before. She did have a colonoscopy a number of years ago at [facility], and on a recent general physical with her primary doctor was told that it was not yet time for her to have her routine colonoscopy. Currently otherwise she is feeling well.    PAST MEDICAL HISTORY: Type 2 diabetes, hypertension, and hyperlipidemia. She has a history of obstructive sleep apnea, but does not use a CPAP. There is a history of gastroesophageal reflux disease as well as PAT. She has had a hysterectomy in the past as well as wrist surgery bilaterally.   GI FAMILY HISTORY: Negative for colorectal cancer, liver disease, or ulcers. Her father, however, had stomach cancer. There is an extensive family history of coronary artery disease.     OUTPATIENT MEDICATIONS:  1. Aspirin 81 mg.  2. Clonazepam 0.5 mg t.i.d.  3. Diltiazem 240 24-hour capsule once a day. 4. Hydrochlorothiazide 25 mg once a day.  5. Lisinopril 5 mg once a day. 6. Metformin 500 mg once a day.  7. Omeprazole 20 mg twice a day.  8. Prozac 20 mg once a day.  9. Simvastatin 10 mg a day.   ALLERGIES: She has no known drug allergies.  REVIEW OF SYSTEMS: Per admission History and Physical, agree with same.   PHYSICAL EXAMINATION:  VITAL SIGNS: Temperature 98.9, pulse 70, respirations 16, blood pressure 116/71, pulse oximetry 92% to 98%.   GENERAL: She is a well-appearing 64 year old Caucasian female in no acute distress.   HEAD: Normocephalic, atraumatic.   EYES: Anicteric.   NOSE: Septum midline. No lesions.   OROPHARYNX: No lesions.   NECK: Supple. No JVD.   HEART: Regular rate and rhythm.   LUNGS: Clear.   ABDOMEN: Soft, nontender, and nondistended. Bowel sounds positive, normoactive.   RECTAL: Anorectal examination shows a mucosy effluent with  small spots of bright red blood, no overt large amount of blood or evidence of melena. There were no lesions noted on digital rectal examination.   EXTREMITIES: No clubbing, cyanosis, or edema.   NEUROLOGICAL: Cranial nerves II through XII grossly intact. Muscle strength bilaterally equal and symmetric, 5/5. Deep tendon reflexes bilaterally equal and symmetric.   LABORATORY, DIAGNOSTIC, AND RADIOLOGICAL DATA: On admission to  the hospital she had a glucose of 208, BUN 13, creatinine 1.14, sodium 138, potassium 3.5, chloride 100, bicarbonate 20, osmolality 282, calcium 8.6. LDH was 228. Serum ethanol less than 3. She has had cardiac enzymes times four. Her initial showed a troponin I less than 0.02, a second one 0.06, the third less than 0.02, the fourth less than 0.02. Slight elevation in CPK-MB to 14.4 at its highest, tapering over the last two to 4.3. Her hemogram on admission two days ago showed a white count of 13.3, hemoglobin/hematocrit 15.5/47.8, platelet count of 397.   Over the interval since her admission on 05/31 her hemoglobin has drifted to 11.1 to 10.7 and back up today to 11.2. She had an EKG showing normal sinus rhythm with an incomplete right bundle branch block. Echo Doppler ejection fraction 55%. She had a CT scan of the head for altered mental status with no acute intracranial process. CT of the cervical spine without contrast showing no evidence of fracture or static listhesis. Ligamentous injury could not be excluded. She had a right hand film for swelling showing no acute evidence of fracture. No foreign body. She had a CT scan of the chest, abdomen, and pelvis showing some mild bowel wall thickening to most loops of the small intestine. However, the colonic wall thickening was felt to be likely due to under distention. Evidence of cholelithiasis, coronary artery calcification, indeterminant pulmonary nodules 6 mm or less.  ASSESSMENT: Rectal bleeding. The patient has never had an episode similar to this before. This seems to be bright red, also seems to be decreasing over the course of time. Her hemoglobin has been stable since yesterday. On rectal exam today this seemed to be more of a mucosy effluent with small amounts of tiny clots. She has no abdominal pain which would indicate acute ischemia. There has been no large amount of blood noted consistent with diverticular bleeding. Differential diagnosis would  include anal outlet bleeding of various etiologies as well as colitis.   RECOMMENDATIONS:  We will do a flexible sigmoidoscopy today under probable light sedation. I have discussed the risks, benefits, and complications of this procedure to include but not limited to bleeding, infection, perforation, and the risk of sedation, and she wishes to proceed.   Further recommendations to follow. The case was discussed with Dr. Dagoberto Ligas.   ____________________________ Lollie Sails, MD mus:bjt D: 04/10/2012 11:31:52 ET T: 04/10/2012 12:02:46 ET JOB#: [id number]  cc: Lollie Sails, MD, <Dictator> Dr. Levin Erp, Lance Coon MD ELECTRONICALLY SIGNED 04/13/2012 17:16

## 2015-06-22 ENCOUNTER — Other Ambulatory Visit: Payer: Self-pay | Admitting: Internal Medicine

## 2015-06-22 NOTE — Telephone Encounter (Signed)
Ok to fill for 2 months and let her know she needs and appointment with Chanetta Marshall, NP

## 2016-01-02 ENCOUNTER — Encounter: Payer: Self-pay | Admitting: Physician Assistant

## 2016-01-02 ENCOUNTER — Ambulatory Visit (INDEPENDENT_AMBULATORY_CARE_PROVIDER_SITE_OTHER): Payer: 59 | Admitting: Physician Assistant

## 2016-01-02 ENCOUNTER — Telehealth: Payer: Self-pay | Admitting: Internal Medicine

## 2016-01-02 VITALS — BP 130/72 | HR 140 | Ht 65.0 in | Wt 233.0 lb

## 2016-01-02 DIAGNOSIS — I480 Paroxysmal atrial fibrillation: Secondary | ICD-10-CM | POA: Diagnosis not present

## 2016-01-02 LAB — BASIC METABOLIC PANEL
BUN: 14 mg/dL (ref 7–25)
CO2: 25 mmol/L (ref 20–31)
Calcium: 9 mg/dL (ref 8.6–10.4)
Chloride: 99 mmol/L (ref 98–110)
Creat: 0.78 mg/dL (ref 0.50–0.99)
Glucose, Bld: 146 mg/dL — ABNORMAL HIGH (ref 65–99)
Potassium: 3.7 mmol/L (ref 3.5–5.3)
Sodium: 136 mmol/L (ref 135–146)

## 2016-01-02 LAB — CBC WITH DIFFERENTIAL/PLATELET
Basophils Absolute: 0.1 10*3/uL (ref 0.0–0.1)
Basophils Relative: 1 % (ref 0–1)
EOS ABS: 0.8 10*3/uL — AB (ref 0.0–0.7)
Eosinophils Relative: 7 % — ABNORMAL HIGH (ref 0–5)
HCT: 37.6 % (ref 36.0–46.0)
Hemoglobin: 12.9 g/dL (ref 12.0–15.0)
Lymphocytes Relative: 29 % (ref 12–46)
Lymphs Abs: 3.3 10*3/uL (ref 0.7–4.0)
MCH: 25.6 pg — AB (ref 26.0–34.0)
MCHC: 34.3 g/dL (ref 30.0–36.0)
MCV: 74.8 fL — ABNORMAL LOW (ref 78.0–100.0)
MONOS PCT: 8 % (ref 3–12)
MPV: 8.9 fL (ref 8.6–12.4)
Monocytes Absolute: 0.9 10*3/uL (ref 0.1–1.0)
NEUTROS PCT: 55 % (ref 43–77)
Neutro Abs: 6.3 10*3/uL (ref 1.7–7.7)
PLATELETS: 413 10*3/uL — AB (ref 150–400)
RBC: 5.03 MIL/uL (ref 3.87–5.11)
RDW: 14.2 % (ref 11.5–15.5)
WBC: 11.4 10*3/uL — ABNORMAL HIGH (ref 4.0–10.5)

## 2016-01-02 LAB — MAGNESIUM: Magnesium: 1.7 mg/dL (ref 1.5–2.5)

## 2016-01-02 MED ORDER — DILTIAZEM HCL ER COATED BEADS 360 MG PO CP24: 360.0000 mg | ORAL_CAPSULE | Freq: Every day | ORAL | Status: DC

## 2016-01-02 MED ORDER — APIXABAN 5 MG PO TABS: 5.0000 mg | ORAL_TABLET | Freq: Two times a day (BID) | ORAL | Status: DC

## 2016-01-02 MED ORDER — METOPROLOL TARTRATE 25 MG PO TABS: 12.5000 mg | ORAL_TABLET | Freq: Two times a day (BID) | ORAL | Status: DC

## 2016-01-02 NOTE — Telephone Encounter (Signed)
New message      Pt is in AFIB and request to be seen today

## 2016-01-02 NOTE — Patient Instructions (Signed)
Medication Instructions:  Your physician has recommended you make the following change in your medication:  1) STOP Aspirin 2) STOP Lisinopril 3) INCREASE Diltiazem to 360mg  daily 4) START Eliquis 5mg  twice daily 5) START Metoprolol 12.5mg  twice daily  Labwork: Bmet, Cbc, Mag today  Testing/Procedures: Your physician has requested that you have an echocardiogram. Echocardiography is a painless test that uses sound waves to create images of your heart. It provides your doctor with information about the size and shape of your heart and how well your heart's chambers and valves are working. This procedure takes approximately one hour. There are no restrictions for this procedure. (To be scheduled in 1-2 weeks)  Follow-Up: Your physician recommends that you schedule a follow-up appointment in: 1 week with an EKG  Any Other Special Instructions Will Be Listed Below (If Applicable).     If you need a refill on your cardiac medications before your next appointment, please call your pharmacy.

## 2016-01-02 NOTE — Progress Notes (Signed)
Cardiology Office Note   Date:  01/02/2016   ID:  Kayla Hendrix, DOB [birth year], MRN [id number]  PCP:  Kayla Peaches, MD  Cardiologist:  Dr. Rayann Hendrix   Chief Complaint  Patient presents with  . Follow-up    seen for Dr. Rayann Hendrix, recurrent atrial fibrillation      History of Present Illness: Kayla Hendrix is a 65 y.o. female who presents for cardiology evaluation of recurrent atrial fibrillation. She follows Dr. Rayann Hendrix. If she has a history of obstructive sleep apnea noncompliant with CPAP, persistent atrial fibrillation, obesity, HTN, GERD, history of nonobstructive CAD on previous cath 2006. She did have a syncope in May 2013 and concern about possible rectal bleed. She was seen by Dr. Fletcher Hendrix for recommended GI workup. She did undergo flexible sigmoidoscopy which showed sign of ischemic bowel which was biopsies, there was no obvious source of bleeding. She has not had any kind of bleeding issue since. Her last follow-up with Dr. Rayann Hendrix was on 06/15/2013 at which time she agreed to start on Xarelto, her aspirin was discontinued. Unfortunately, patient was not feeling well after starting on Xarelto, after reading some commercial, she decided to stop Xarelto and restart her baby aspirin. She states she was not having any bleeding issue on Xarelto.   Labs she has very superficial radial pulse, on the night of 01/01/2016, she noticed her radial pulse was beating very fast. When she checked her pulse she realized she was in tachycardia and went back to atrial fibrillation. Unfortunately without any sign of cardiac awareness, she does not know when she went into atrial fibrillation. Last time she had normal heart rate for sure was several weeks ago. She denies any chronic chest discomfort, dizziness, and has been generally feeling very well recently. She has no hemodynamic changes despite in atrial fibrillation. She was placed on flex clinic scheduled to be seen early.    Past Medical History    Diagnosis Date  . OSA (obstructive sleep apnea)     noncompliant with CPAP  . Glucose intolerance (impaired glucose tolerance)   . Atrial fibrillation (HCC)     paroxysmal only asa 81 mg, no other blood thinner, the xarelto she was on gave her HAs  . Obesity   . HL (hearing loss)   . HTN (hypertension)   . GERD (gastroesophageal reflux disease)   . CAD (coronary artery disease)     cath 2006- nonobstructive CAD  . Depression   . Diabetes mellitus without complication [facility])     Past Surgical History  Procedure Laterality Date  . Total abdominal hysterectomy    . Cesarean section       Current Outpatient Prescriptions  Medication Sig Dispense Refill  . clonazePAM (KLONOPIN) 0.5 MG tablet Take 0.5 mg by mouth 3 (three) times daily.      Marland Kitchen diltiazem (CARDIZEM CD) 360 MG 24 hr capsule Take 1 capsule (360 mg total) by mouth daily. 30 capsule 11  . FLUoxetine (PROZAC) 20 MG capsule Take 20 mg by mouth daily.      . hydrochlorothiazide (HYDRODIURIL) 25 MG tablet Take 0.5 tablets (12.5 mg total) by mouth daily. 30 tablet 6  . metFORMIN (GLUCOPHAGE) 500 MG tablet Take 500 mg by mouth daily.      Marland Kitchen omeprazole (PRILOSEC) 20 MG capsule Take 20 mg by mouth 2 (two) times daily.      . simvastatin (ZOCOR) 10 MG tablet TAKE 1 TABLET BY MOUTH AT BEDTIME 30 tablet 6  .  apixaban (ELIQUIS) 5 MG TABS tablet Take 1 tablet (5 mg total) by mouth 2 (two) times daily. 60 tablet 11  . metoprolol tartrate (LOPRESSOR) 25 MG tablet Take 0.5 tablets (12.5 mg total) by mouth 2 (two) times daily. 30 tablet 11   No current facility-administered medications for this visit.    Allergies:   Hydrocodone    Social History:  The patient  reports that she has quit smoking. She has never used smokeless tobacco. She reports that she does not drink alcohol or use illicit drugs.   Family History:  The patient's family history includes Cancer in her father.    ROS:  Please see the history of present illness.    Otherwise, review of systems are positive for tachycardia, however no palpitation sensation, no chest pain, no shortness of breath, no dizziness.   All other systems are reviewed and negative.    PHYSICAL EXAM: VS:  BP 130/72 mmHg  Pulse 140  Ht 5\' 5"  (1.651 m)  Wt 233 lb (105.688 kg)  BMI 38.77 kg/m2 , BMI Body mass index is 38.77 kg/(m^2). GEN: Well nourished, well developed, in no acute distress HEENT: normal Neck: no JVD, carotid bruits, or masses Cardiac: Irregular, tachycardic; no murmurs, rubs, or gallops,no edema  Respiratory:  clear to auscultation bilaterally, normal work of breathing GI: soft, nontender, nondistended, + BS MS: no deformity or atrophy Skin: warm and dry, no rash Neuro:  Strength and sensation are intact Psych: euthymic mood, full affect   EKG:  EKG is ordered today. The ekg ordered today demonstrates A. fib with RVR with heart rate 137.   Recent Labs: No results found for requested labs within last 365 days.    Lipid Panel    Component Value Date/Time   CHOL 159 04/10/2012 0117   CHOL 184 08/06/2009 1136   TRIG 230* 04/10/2012 0117   TRIG 244.0* 08/06/2009 1136   HDL 34* 04/10/2012 0117   HDL 32.40* 08/06/2009 1136   CHOLHDL 6 08/06/2009 1136   VLDL 46* 04/10/2012 0117   VLDL 48.8* 08/06/2009 1136   LDLCALC 79 04/10/2012 0117   LDLCALC * 07/12/2009 0415    125        Total Cholesterol/HDL:CHD Risk Coronary Heart Disease Risk Table                     Men   Women  1/2 Average Risk   3.4   3.3  Average Risk       5.0   4.4  2 X Average Risk   9.6   7.1  3 X Average Risk  23.4   11.0        Use the calculated Patient Ratio above and the CHD Risk Table to determine the patient's CHD Risk.        ATP III CLASSIFICATION (LDL):  <100     mg/dL   Optimal  100-129  mg/dL   Near or Above                    Optimal  130-159  mg/dL   Borderline  160-189  mg/dL   High  >190     mg/dL   Very High   LDLDIRECT 117.4 08/06/2009 1136      Wt  Readings from Last 3 Encounters:  01/02/16 233 lb (105.688 kg)  05/09/14 244 lb 9.6 oz (110.95 kg)  05/04/14 242 lb 9.6 oz (110.043 kg)      Other  studies Reviewed: Additional studies/ records that were reviewed today include:   TEE 07/13/2009  Left ventricle: Normal LV function. Aortic valve: Mildly thickened leaflets. Doppler: No regurgitation. Aorta: Mild fixed atherosclerotic plaquing in thoracic aorta. Mitral valve: Mildly thickened leaflets . Doppler: Trivial regurgitation. Left atrium: No evidence of thrombus in the atrial cavity or appendage. Atrial septum: No defect or patent foramen ovale was identified. Tricuspid valve: Structurally normal valve. Leaflet separation was normal. Doppler: No regurgitation. Post procedure conclusions Ascending Aorta:  - Mild fixed atherosclerotic plaquing in thoracic aorta. Prepared and Electronically Authenticated by   Cardiac catheterization 08/07/2005 CONCLUSION: 1. Well-preserved overall left ventricular function. 2. Calcification of mid left anterior descending artery without critical  stenosis. 3. Scattered luminal irregularities throughout the coronary system.  RECOMMENDATIONS: Aggressive risk factor reduction which would include lipid lowering, discontinuation of smoking and weight loss as well as regular exercise. Follow-up will be with Dr. Nyoka Cowden.    Review of the above records demonstrates:   Last cardiology note demonstrated the patient was started on Xarelto, however she has since stopped her Xarelto and started on 81mg  ASA, she had recurrence of atrial fibrillation with RVR of unknown duration. She has no cardiac awareness.   ASSESSMENT AND PLAN:  1.  Atrial fibrillation with RVR, heart rate 137 on EKG.  - Given lack of cardiac awareness, stable vital signs, I think this can be potentially manage as outpatient.  - She is currently taking lisinopril 10mg  daily, hydrochlorothiazide 12.5mg   BID and diltiazem for blood pressure, I will discontinue lisinopril and increase diltiazem CD from 240 mg to 360 mg. I will add metoprolol tartrate 12.5 mg twice a day for rate control.  - She was previously on Xarelto, however stopped the medication due to commercial and feeling bad, I have discussed with her risk and benefit of systemic anticoagulation, she has CHA2DS2-Vasc score of 3 (female, HTN, CAD).  - This patients CHA2DS2-VASc Score and unadjusted Ischemic Stroke Rate (% per year) is equal to 3.2 % stroke rate/year from a score of 3  - Given the fact that she has not had any major bleeding issue in the recent years, I have recommended starting on eliquis which she agreed. We'll start eliquis 5 mg twice a day. If she does not convert in 3 weeks, we can consider DCCV or chemical cardioversion with amiodarone.   - obtain echocardiogram in 1-2 weeks after her HR is better controlled. Follow-up with cardiology in one week to reassess heart rate.  2. obstructive sleep apnea noncompliant with CPAP: states she can not tolerate the CPAP  3. history of nonobstructive CAD on previous cath 2006  4. HTN: on lisinopril, diltiazem and HCTZ, we'll stop lisinopril, increase diltiazem dose to 360 and add metoprolol 12.5 mg BID   Current medicines are reviewed at length with the patient today.  The patient does not have concerns regarding medicines.  The following changes have been made:    Stop aspirin, add eliquis 5 mg twice a day. Stop lisinopril, increase diltiazem to 360, add metoprolol 12.5 mg twice a day.  Labs/ tests ordered today include:   Orders Placed This Encounter  Procedures  . Basic metabolic panel  . Magnesium  . CBC w/Diff  . EKG 12-Lead  . Echocardiogram     Disposition:   FU with Dr. Rayann Hendrix or his APP in 1 week  Signed, Almyra Deforest, [place]  01/02/2016 5:27 PM    [place], [place]  [id number] Phone: [phone]; Fax: [phone]

## 2016-01-02 NOTE — Telephone Encounter (Signed)
SPOKE WITH  PT  AND FEELS  SHE IS IN  A  FIB  SINCE  LAST NIGHT .HEART RATE  EARLIER  WAS  130.   SINCE  HAS TAKEN  DILTIAZEM  AND  HEART  RATE NOW IS  108.  HAS  NOT  BEEN SEEN SINCE 2014 AND  PER PT  DOES NOT  FEEL SHE  HAS  HAD  AN AFIB  EPISODE  IN  THE LAST  COUPLE  OF YEARS  UNTIL  LAST NIGHT.PT  HAS  STOPPED  XARELTO  SAID  SHE DID NOT  FEEL  WELL AND  DISCUSSED  THIS WITH  PMD .PT  IS REQUESTING AN  APPT . APPT MADE  WITH [facility] PA  TODAY  AT 3:30 PM.  PT  AWARE IF S/S  WORSEN  TO GO  TO ER   FOR  EVAL AND  TX . PT  AGREES./CY

## 2016-01-03 ENCOUNTER — Telehealth: Payer: Self-pay | Admitting: Internal Medicine

## 2016-01-03 NOTE — Telephone Encounter (Signed)
Please call,says she is trying to find out about appointment Eulas Post wants her to have next week.

## 2016-01-03 NOTE — Telephone Encounter (Signed)
Patient states her understanding after yesterday's appt w/ Almyra Deforest PA-C was that she would f/u with physician/externder next week, but this wasn't scheduled. Informed patient that she is correct. Scheduled her to see Bonney Leitz on Monday with an EKG to look at heart rhythm. (this is only day patient can come, so that she can get clearance to return to work) (was unable to schedule with EP extender as they aren't in the office Monday)  Patient thanks me for helping.

## 2016-01-04 NOTE — Progress Notes (Signed)
Cardiology Office Note   Date:  01/07/2016   ID:  Kayla Hendrix, DOB 15-Nov-1950, MRN [id number]  PCP:  Kayla Peaches, MD  Cardiologist:  Dr. Rayann Hendrix   Follow up afib    History of Present Illness: Kayla Hendrix is a 65 y.o. female who presents for cardiology evaluation of recurrent atrial fibrillation. She follows Dr. Rayann Hendrix. If she has a history of obstructive sleep apnea noncompliant with CPAP, persistent atrial fibrillation, obesity, HTN, GERD, history of nonobstructive CAD on previous cath 2006. She did have a syncope in May 2013 and concern about possible rectal bleed. She was seen by Dr. Fletcher Hendrix for recommended GI workup. She did undergo flexible sigmoidoscopy which showed sign of ischemic bowel which was biopsied; there was no obvious source of bleeding. She has not had any kind of bleeding issue since. Her last follow-up with Dr. Rayann Hendrix was on 06/15/2013 at which time she agreed to start on Xarelto, her aspirin was discontinued. Unfortunately, patient was not feeling well after starting on Xarelto, after reading some commercial, she decided to stop Xarelto and restart her baby aspirin. She states she was not having any bleeding issue on Xarelto.   She was seen by Kayla Deforest PA-C on 01/02/16 for evaluation of afib with RVR when the patient noticed a bounding radial pulse at home (otherwise asymptomatic). Her HR was 137 in the office. Given relative lack of symptoms it was felt that we could mange it as an outpatient. Her lisinopril 10mg  was discontinued and diltiazem CD was increased from 240 mg to 360 mg. Metoprolol tartrate 12.5 mg twice a day was also added for additional rate control. It was decided to start her on Eliquis with her elevated CHADSVASC score and previous intolerance and self discontinuation of Xarelto.  Today she presents to clinic for follow up. She continues to be in A. fib with RVR heart rate 133 today. She continues to feel fatigued and sometimes short of breath. She  does get minor chest pain with exertion that quickly resolves with rest. No dizziness or syncope. No blood in her stool or urine. She has been out of work for the past week due to her atrial fibrillation with RVR. No lower extremity edema, orthopnea or PND.   Past Medical History  Diagnosis Date  . OSA (obstructive sleep apnea)     noncompliant with CPAP  . Glucose intolerance (impaired glucose tolerance)   . Atrial fibrillation (HCC)     paroxysmal only asa 81 mg, no other blood thinner, the xarelto she was on gave her HAs  . Obesity   . HL (hearing loss)   . HTN (hypertension)   . GERD (gastroesophageal reflux disease)   . CAD (coronary artery disease)     cath 2006- nonobstructive CAD  . Depression   . Diabetes mellitus without complication [facility])     Past Surgical History  Procedure Laterality Date  . Total abdominal hysterectomy    . Cesarean section       Current Outpatient Prescriptions  Medication Sig Dispense Refill  . apixaban (ELIQUIS) 5 MG TABS tablet Take 1 tablet (5 mg total) by mouth 2 (two) times daily. 60 tablet 11  . clonazePAM (KLONOPIN) 0.5 MG tablet Take 0.5 mg by mouth 3 (three) times daily.      Marland Kitchen diltiazem (CARDIZEM CD) 360 MG 24 hr capsule Take 1 capsule (360 mg total) by mouth daily. 30 capsule 11  . FLUoxetine (PROZAC) 20 MG capsule Take 20  mg by mouth daily.      . hydrochlorothiazide (HYDRODIURIL) 25 MG tablet Take 0.5 tablets (12.5 mg total) by mouth daily. 30 tablet 6  . metFORMIN (GLUCOPHAGE) 500 MG tablet Take 500 mg by mouth daily.      . metoprolol tartrate (LOPRESSOR) 25 MG tablet Take 0.5 tablets (12.5 mg total) by mouth 2 (two) times daily. 30 tablet 11  . omeprazole (PRILOSEC) 20 MG capsule Take 20 mg by mouth 2 (two) times daily.      . simvastatin (ZOCOR) 10 MG tablet TAKE 1 TABLET BY MOUTH AT BEDTIME 30 tablet 6   No current facility-administered medications for this visit.    Allergies:   Hydrocodone    Social History:  The patient   reports that she has quit smoking. She has never used smokeless tobacco. She reports that she does not drink alcohol or use illicit drugs.   Family History:  The patient's family history includes Cancer in her father.    ROS:  Please see the history of present illness.   Otherwise, review of systems are positive for tachycardia, however no palpitation sensation, no chest pain, no shortness of breath, no dizziness.   All other systems are reviewed and negative.    PHYSICAL EXAM: VS:  BP 114/72 mmHg  Pulse 133  Ht 5\' 5"  (1.651 m)  Wt 233 lb 12.8 oz (106.051 kg)  BMI 38.91 kg/m2 , BMI Body mass index is 38.91 kg/(m^2). GEN: Well nourished, well developed, in no acute distress HEENT: normal Neck: no JVD, carotid bruits, or masses Cardiac: Irregular, tachycardic; no murmurs, rubs, or gallops,no edema  Respiratory:  clear to auscultation bilaterally, normal work of breathing GI: soft, nontender, nondistended, + BS MS: no deformity or atrophy Skin: warm and dry, no rash Neuro:  Strength and sensation are intact Psych: euthymic mood, full affect   EKG:  EKG is ordered today. The ekg ordered today demonstrates HR 133 afib with RVR RAD, low voltage QRS   Recent Labs: 01/02/2016: BUN 14; Creat 0.78; Hemoglobin 12.9; Magnesium 1.7; Platelets 413*; Potassium 3.7; Sodium 136    Lipid Panel     Wt Readings from Last 3 Encounters:  01/07/16 233 lb 12.8 oz (106.051 kg)  01/02/16 233 lb (105.688 kg)  05/09/14 244 lb 9.6 oz (110.95 kg)      Other studies Reviewed: Additional studies/ records that were reviewed today include:   TEE 07/13/2009  Left ventricle: Normal LV function. Aortic valve: Mildly thickened leaflets. Doppler: No regurgitation. Aorta: Mild fixed atherosclerotic plaquing in thoracic aorta. Mitral valve: Mildly thickened leaflets . Doppler: Trivial regurgitation. Left atrium: No evidence of thrombus in the atrial cavity or appendage. Atrial septum: No defect  or patent foramen ovale was identified. Tricuspid valve: Structurally normal valve. Leaflet separation was normal. Doppler: No regurgitation. Post procedure conclusions Ascending Aorta:  - Mild fixed atherosclerotic plaquing in thoracic aorta. Prepared and Electronically Authenticated by   Cardiac catheterization 08/07/2005 CONCLUSION: 1. Well-preserved overall left ventricular function. 2. Calcification of mid left anterior descending artery without critical  stenosis. 3. Scattered luminal irregularities throughout the coronary system.  RECOMMENDATIONS: Aggressive risk factor reduction which would include lipid lowering, discontinuation of smoking and weight loss as well as regular exercise. Follow-up will be with Dr. Nyoka Cowden.    Review of the above records demonstrates:   Last cardiology note demonstrated the patient was started on Xarelto, however she has since stopped her Xarelto and started on 81mg  ASA, she had recurrence of atrial fibrillation  with RVR of unknown duration. She has no cardiac awareness.   ASSESSMENT AND PLAN:  1.  Atrial fibrillation: today she remains in afib with RVR  - Rate not well controlled on diltiazem CD 360 mg and metoprolol tartrate 12.5 mg BID. I will stop her hydrochlorothiazide and increase metoprolol from 12.5 mg twice a day to 25 mg twice a day and see her back Thursday in the office.  - She was previously on Xarelto, however stopped the medication due to commercial and feeling bad. Due to her CHA2DS2-Vasc score of 3 (female, HTN, CAD), it was decided to start her on Eliquis 5mg  BID which she has been tolerating well.   - If she does not convert after 3 weeks of uninterrupted Eliquis, we can consider DCCV or chemical cardioversion with amiodarone.   - I will obtain an echocardiogram when HR is better controlled   2. Obstructive sleep apnea noncompliant with CPAP: states she can not tolerate the CPAP  3. History of  nonobstructive CAD on previous cath 2006: no ASA given NOAC use. Continue BB and statin   4. HTN: BP well controlled  on HCTZ 12.5mg  daily, Dilt CD 360 mg and metoprolol 12.5 mg BID. As above,  I will stop her hydrochlorothiazide and increase metoprolol from 12.5 mg twice a day to 25 mg twice a day and see her back Thursday in the office.    Current medicines are reviewed at length with the patient today.  The patient does not have concerns regarding medicines.  The following changes have been made:  Stop HCTZ. Increase Lopressor 12.5 mg twice a day to 25 mg twice a day  Labs/ tests ordered today include:   No orders of the defined types were placed in this encounter.     Disposition:   FU with me on Thursday.  Renea Ee  01/07/2016 8:15 AM    [facility] [place], [place], [place]  [address] Phone: [phone]; Fax: [phone]

## 2016-01-07 ENCOUNTER — Ambulatory Visit (INDEPENDENT_AMBULATORY_CARE_PROVIDER_SITE_OTHER): Payer: 59 | Admitting: Physician Assistant

## 2016-01-07 ENCOUNTER — Encounter: Payer: Self-pay | Admitting: Physician Assistant

## 2016-01-07 ENCOUNTER — Encounter: Payer: Self-pay | Admitting: *Deleted

## 2016-01-07 VITALS — BP 114/72 | HR 133 | Ht 65.0 in | Wt 233.8 lb

## 2016-01-07 DIAGNOSIS — I4891 Unspecified atrial fibrillation: Secondary | ICD-10-CM | POA: Diagnosis not present

## 2016-01-07 DIAGNOSIS — I480 Paroxysmal atrial fibrillation: Secondary | ICD-10-CM | POA: Diagnosis not present

## 2016-01-07 MED ORDER — METOPROLOL TARTRATE 25 MG PO TABS: 25.0000 mg | ORAL_TABLET | Freq: Two times a day (BID) | ORAL | Status: DC

## 2016-01-07 NOTE — Progress Notes (Signed)
Cardiology Office Note   Date:  01/10/2016   ID:  Kayla Hendrix, DOB 05-23-1951, MRN [id number]  PCP:  Kayla Peaches, MD  Cardiologist:  Dr. Rayann Hendrix   Follow up afib    History of Present Illness: Kayla Hendrix is a 65 y.o. female who presents for cardiology evaluation of recurrent atrial fibrillation. She follows Dr. Rayann Hendrix. If she has a history of obstructive sleep apnea noncompliant with CPAP, persistent atrial fibrillation, obesity, HTN, GERD, history of nonobstructive CAD on previous cath 2006. She did have a syncope in May 2013 and concern about possible rectal bleed. She was seen by Dr. Fletcher Hendrix for recommended GI workup. She did undergo flexible sigmoidoscopy which showed sign of ischemic bowel which was biopsied; there was no obvious source of bleeding. She has not had any kind of bleeding issue since. Her last follow-up with Dr. Rayann Hendrix was on 06/15/2013 at which time she agreed to start on Xarelto, her aspirin was discontinued. Unfortunately, patient was not feeling well after starting on Xarelto, after reading some commercial, she decided to stop Xarelto and restart her baby aspirin. She states she was not having any bleeding issue on Xarelto.   She was seen by Kayla Deforest PA-C on 01/02/16 for evaluation of afib with RVR when the patient noticed a bounding radial pulse at home (otherwise asymptomatic). Her HR was 137 in the office. Given relative lack of symptoms it was felt that we could mange it as an outpatient. Her lisinopril 10mg  was discontinued and diltiazem CD was increased from 240 mg to 360 mg. Metoprolol tartrate 12.5 mg twice a day was also added for additional rate control. It was decided to start her on Eliquis with her elevated CHADSVASC score and previous intolerance and self discontinuation of Xarelto.  I saw her for follow-up in 01/07/16. She continued to be in A. fib with RVR heart rate 133 . She continued to feel fatigued and sometimes short of breath. I discontinued  her HCTZ and increase her metoprolol to heart rate from 12.5 mg twice a day to 25 mg twice a day.  Today she presents to clinic for follow-up. No CP. She still has some SOB with exertion. Sometimes she feels like she has to take a deep breath and then has to yawn. Her fatigue improved a little this AM. No palpitations, LE edema, orthopnea or PND. No dizziness or syncope. No blood in her stool or urine.      Past Medical History  Diagnosis Date  . OSA (obstructive sleep apnea)     noncompliant with CPAP  . Glucose intolerance (impaired glucose tolerance)   . Atrial fibrillation (HCC)     paroxysmal only asa 81 mg, no other blood thinner, the xarelto she was on gave her HAs  . Obesity   . HL (hearing loss)   . HTN (hypertension)   . GERD (gastroesophageal reflux disease)   . CAD (coronary artery disease)     cath 2006- nonobstructive CAD  . Depression   . Diabetes mellitus without complication [facility])     Past Surgical History  Procedure Laterality Date  . Total abdominal hysterectomy    . Cesarean section       Current Outpatient Prescriptions  Medication Sig Dispense Refill  . apixaban (ELIQUIS) 5 MG TABS tablet Take 1 tablet (5 mg total) by mouth 2 (two) times daily. 60 tablet 11  . clonazePAM (KLONOPIN) 0.5 MG tablet Take 0.5 mg by mouth 3 (three) times daily.      Marland Kitchen  diltiazem (CARDIZEM CD) 360 MG 24 hr capsule Take 1 capsule (360 mg total) by mouth daily. 30 capsule 11  . FLUoxetine (PROZAC) 20 MG capsule Take 20 mg by mouth daily.      . metFORMIN (GLUCOPHAGE) 500 MG tablet Take 500 mg by mouth daily.      . metoprolol tartrate 37.5 MG TABS Take 37.5 mg by mouth 2 (two) times daily. 180 tablet 0  . omeprazole (PRILOSEC) 20 MG capsule Take 20 mg by mouth 2 (two) times daily.      . simvastatin (ZOCOR) 10 MG tablet TAKE 1 TABLET BY MOUTH AT BEDTIME 30 tablet 6   No current facility-administered medications for this visit.    Allergies:   Hydrocodone    Social History:   The patient  reports that she has quit smoking. She has never used smokeless tobacco. She reports that she does not drink alcohol or use illicit drugs.   Family History:  The patient's family history includes Cancer in her father.    ROS:  Please see the history of present illness.   Otherwise, review of systems are positive for tachycardia, however no palpitation sensation, no chest pain, no shortness of breath, no dizziness.   All other systems are reviewed and negative.    PHYSICAL EXAM: VS:  BP 122/74 mmHg  Pulse 126  Ht 5\' 5"  (1.651 m)  Wt 236 lb (107.049 kg)  BMI 39.27 kg/m2 , BMI Body mass index is 39.27 kg/(m^2). GEN: Well nourished, well developed, in no acute distress HEENT: normal Neck: no JVD, carotid bruits, or masses Cardiac: Irregular, tachycardic; no murmurs, rubs, or gallops,no edema  Respiratory:  clear to auscultation bilaterally, normal work of breathing GI: soft, nontender, nondistended, + BS MS: no deformity or atrophy Skin: warm and dry, no rash Neuro:  Strength and sensation are intact Psych: euthymic mood, full affect   EKG:  EKG is ordered today. The ekg ordered today demonstrates HR 133 afib with RVR RAD, low voltage QRS   Recent Labs: 01/02/2016: BUN 14; Creat 0.78; Hemoglobin 12.9; Magnesium 1.7; Platelets 413*; Potassium 3.7; Sodium 136    Lipid Panel     Wt Readings from Last 3 Encounters:  01/10/16 236 lb (107.049 kg)  01/07/16 233 lb 12.8 oz (106.051 kg)  01/02/16 233 lb (105.688 kg)      Other studies Reviewed: Additional studies/ records that were reviewed today include:   TEE 07/13/2009  Left ventricle: Normal LV function. Aortic valve: Mildly thickened leaflets. Doppler: No regurgitation. Aorta: Mild fixed atherosclerotic plaquing in thoracic aorta. Mitral valve: Mildly thickened leaflets . Doppler: Trivial regurgitation. Left atrium: No evidence of thrombus in the atrial cavity or appendage. Atrial septum: No defect or  patent foramen ovale was identified. Tricuspid valve: Structurally normal valve. Leaflet separation was normal. Doppler: No regurgitation. Post procedure conclusions Ascending Aorta:  - Mild fixed atherosclerotic plaquing in thoracic aorta. Prepared and Electronically Authenticated by   Cardiac catheterization 08/07/2005 CONCLUSION: 1. Well-preserved overall left ventricular function. 2. Calcification of mid left anterior descending artery without critical  stenosis. 3. Scattered luminal irregularities throughout the coronary system.  RECOMMENDATIONS: Aggressive risk factor reduction which would include lipid lowering, discontinuation of smoking and weight loss as well as regular exercise. Follow-up will be with Dr. Nyoka Cowden.  ASSESSMENT AND PLAN:  1.  Persistent Atrial fibrillation: today she remains in afib with RVR. HR 126 bpm  - Rate 126 bpm today on diltiazem CD 360 mg and metoprolol tartrate 25 mg BID.  I will increase Metoprolol to 37.5mg  BID.   - She was previously on Xarelto, however stopped the medication due to commercial and feeling bad. Due to her CHA2DS2-Vasc score of 3 (female, HTN, CAD), it was decided to start her on Eliquis 5mg  BID which she has been tolerating well. This was started on 01/02/16  - If she does not convert after 3 weeks of uninterrupted Eliquis, we can consider DCCV on (01/23/16). I will have her seen back in the office on 01/21/16 for evaluation and to have that set up.   - I will obtain an echocardiogram when HR is better controlled (this was originally scheduled for next Monday, but i have cancelled this.)  2. Obstructive sleep apnea noncompliant with CPAP: states she can not tolerate the CPAP  3. History of nonobstructive CAD on previous cath 2006: no ASA given NOAC use. Continue BB and statin   4. HTN: BP well controlled  on Dilt CD 360 mg and metoprolol 25 mg BID. As above will increase metoprolol to 37.5mg  BID for better rate  control.    Current medicines are reviewed at length with the patient today.  The patient does not have concerns regarding medicines.  The following changes have been made:  Labs/ tests ordered today include:   Orders Placed This Encounter  Procedures  . EKG 12-Lead     Disposition:   FU with APP on 01/21/16 to get set up for DCCV  Signed, Eileen Stanford, PA-C  01/10/2016 9:14 AM    [facility] [place], [place], [place]  [address] Phone: [phone]; Fax: [phone]

## 2016-01-07 NOTE — Patient Instructions (Signed)
Medication Instructions:  1) STOP Hydrachlorathiazide 2) INCREASE your Metoprolol Tartrate to 25mg  twice daily  Labwork: None  Testing/Procedures: None  Follow-Up: Your physician recommends that you schedule a follow-up appointment on Thursday on [facility] with Bonney Leitz, PA-C. (Needs EKG)   Any Other Special Instructions Will Be Listed Below (If Applicable).     If you need a refill on your cardiac medications before your next appointment, please call your pharmacy.

## 2016-01-10 ENCOUNTER — Ambulatory Visit (INDEPENDENT_AMBULATORY_CARE_PROVIDER_SITE_OTHER): Payer: 59 | Admitting: Physician Assistant

## 2016-01-10 ENCOUNTER — Encounter: Payer: Self-pay | Admitting: Physician Assistant

## 2016-01-10 ENCOUNTER — Encounter: Payer: Self-pay | Admitting: *Deleted

## 2016-01-10 VITALS — BP 122/74 | HR 126 | Ht 65.0 in | Wt 236.0 lb

## 2016-01-10 DIAGNOSIS — I4891 Unspecified atrial fibrillation: Secondary | ICD-10-CM

## 2016-01-10 DIAGNOSIS — I480 Paroxysmal atrial fibrillation: Secondary | ICD-10-CM

## 2016-01-10 MED ORDER — METOPROLOL TARTRATE 37.5 MG PO TABS: 37.5000 mg | ORAL_TABLET | Freq: Two times a day (BID) | ORAL | Status: DC

## 2016-01-10 NOTE — Patient Instructions (Addendum)
Medication Instructions:  Your physician has recommended you make the following change in your medication:  1.  INCREASE the Metoprolol to 37.5 mg taking 1 tablet twice a day   Labwork: None ordered  Testing/Procedures: None ordered  Follow-Up: Your physician recommends that you schedule a follow-up appointment on 01/21/16 WITH 1ST AVAILABLE EXTENDER TO DISCUSS CARVIOVERSION   Any Other Special Instructions Will Be Listed Below (If Applicable).   If you need a refill on your cardiac medications before your next appointment, please call your pharmacy.

## 2016-01-14 ENCOUNTER — Other Ambulatory Visit (HOSPITAL_COMMUNITY): Payer: 59

## 2016-01-14 ENCOUNTER — Telehealth: Payer: Self-pay | Admitting: Physician Assistant

## 2016-01-14 ENCOUNTER — Telehealth: Payer: Self-pay | Admitting: Internal Medicine

## 2016-01-14 NOTE — Telephone Encounter (Signed)
Pt says she have been having more problems with her breathing.fg

## 2016-01-14 NOTE — Telephone Encounter (Signed)
Please call,pt says she is having more problems with her breathing. Please call to advise.

## 2016-01-14 NOTE — Telephone Encounter (Signed)
Returned call to patient.  She says that her SOB was worse yesterday and today.  She does not sound SOB over the phone.  She is taking Eliquis 5 mg bid, Diltiazem 360 mg daily and Metoprolol 37.5 mg bid.  Currently her BP is 111/81 with a HR of 106.  She does admit to missing a dose of Eliquis on Fri or Sat pm, she can't remember which.  Is scheduled to return for OV 01/21/16 to discuss DCCV.  I have let her know that she may have to have a TEE guided DCCV with missing the dose of Eliquis.  Offered her an appointment tomorrow but she declined.  She is going to take an extra 1/2 of Metoprolol(total of 50mg ) if BP is staying like it is currently and HR > 100.

## 2016-01-20 NOTE — Progress Notes (Signed)
Cardiology Office Note:    Date:  01/21/2016   ID:  Kayla Hendrix, DOB 16-Apr-1951, MRN [id number]  PCP:  Criselda Peaches, MD  Cardiologist:  Dr. Thompson Grayer   Electrophysiologist:  Dr. Thompson Grayer   Chief Complaint  Patient presents with  . Atrial Fibrillation    ECG - Follow up (? set up DCCV)    History of Present Illness:     Kayla Hendrix is a 65 y.o. female with a hx of persistent atrial fibrillation, OSA, obesity, HTN, GERD, nonobstructive CAD by cardiac catheterization in 2006. Patient has a prior history of syncope in 5/13 with concerns of possible rectal bleeding. Flexible sigmoidoscopy demonstrated signs of ischemic bowel. There was no obvious source of bleeding. She has had no further bleeding. She was placed on Xarelto in 8/14 by Dr. Rayann Heman. However, the patient discontinued this. She was seen in the office in 2/17 with AF with RVR. She was placed on Cardizem for rate control as well as metoprolol. She was placed on Apixaban for anticoagulation. She was seen by Nell Range, PA-C in follow-up. Rate controlling medications were adjusted. Last seen 01/10/16. Heart rate remained uncontrolled. She returns for close follow-up with an eye towards cardioversion if she remains in atrial fibrillation.  She returns for FU. Here alone. She has felt more short of breath over the past week.  She denies orthopnea.  Denies LE edema.  She may have some increased abdominal girth.  Weight is up 6 lbs since last visit.  She denies syncope.  She has some atypical chest pain that is chronic and related to her GERD. She denies any changes. Denies any bleeding issues.    Past Medical History  Diagnosis Date  . OSA (obstructive sleep apnea)     noncompliant with CPAP  . Glucose intolerance (impaired glucose tolerance)   . Atrial fibrillation (HCC)     paroxysmal only asa 81 mg, no other blood thinner, the xarelto she was on gave her HAs  . Obesity   . HL (hearing loss)   . HTN  (hypertension)   . GERD (gastroesophageal reflux disease)   . CAD (coronary artery disease)     cath 2006- nonobstructive CAD  . Depression   . Diabetes mellitus without complication [facility])     Past Surgical History  Procedure Laterality Date  . Total abdominal hysterectomy    . Cesarean section      Current Medications: Outpatient Prescriptions Prior to Visit  Medication Sig Dispense Refill  . apixaban (ELIQUIS) 5 MG TABS tablet Take 1 tablet (5 mg total) by mouth 2 (two) times daily. 60 tablet 11  . clonazePAM (KLONOPIN) 0.5 MG tablet Take 0.5 mg by mouth 3 (three) times daily.      Marland Kitchen diltiazem (CARDIZEM CD) 360 MG 24 hr capsule Take 1 capsule (360 mg total) by mouth daily. 30 capsule 11  . FLUoxetine (PROZAC) 20 MG capsule Take 20 mg by mouth daily.      . metFORMIN (GLUCOPHAGE) 500 MG tablet Take 1,000 mg by mouth 2 (two) times daily with a meal.     . omeprazole (PRILOSEC) 20 MG capsule Take 20 mg by mouth 2 (two) times daily.      . simvastatin (ZOCOR) 10 MG tablet TAKE 1 TABLET BY MOUTH AT BEDTIME 30 tablet 6  . metoprolol tartrate 37.5 MG TABS Take 37.5 mg by mouth 2 (two) times daily. 180 tablet 0   No facility-administered medications prior to visit.  Allergies:   Hydrocodone   Social History   Social History  . Marital Status: Single    Spouse Name: N/A  . Number of Children: N/A  . Years of Education: N/A   Social History Main Topics  . Smoking status: Former Research scientist (life sciences)  . Smokeless tobacco: Never Used     Comment: 15-pack-year smoker  . Alcohol Use: No  . Drug Use: No  . Sexual Activity: Not Asked   Other Topics Concern  . None   Social History Narrative   Pt. Lives in [place] with her son. 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      Family History:  The patient's family history includes Cancer in her father.   ROS:   Please see the history of present illness.      Review of Systems  Cardiovascular: Positive for dyspnea on exertion and irregular heartbeat.  Respiratory: Positive for shortness of breath.    All other systems reviewed and are negative.   Physical Exam:    VS:  BP 120/58 mmHg  Pulse 100  Ht 5' 6.5" (1.689 m)  Wt 242 lb 6.4 oz (109.952 kg)  BMI 38.54 kg/m2   GEN: Well nourished, well developed, in no acute distress HEENT: normal Neck: no JVD, no masses Cardiac: Normal S1/S2, irreg irreg rhythm; no murmurs, rubs, or gallops, no edema   Respiratory:  clear to auscultation bilaterally; no wheezing, rhonchi or rales GI: soft, nontender, nondistended MS: no deformity or atrophy Skin: warm and dry Neuro: No focal deficits  Psych: Alert and oriented x 3, normal affect  Wt Readings from Last 3 Encounters:  01/21/16 242 lb 6.4 oz (109.952 kg)  01/10/16 236 lb (107.049 kg)  01/07/16 233 lb 12.8 oz (106.051 kg)      Studies/Labs Reviewed:     EKG:  EKG is  ordered today.  The ekg ordered today demonstrates atrial fibrillation, HR 99, low voltage  Recent Labs: 01/02/2016: BUN 14; Creat 0.78; Hemoglobin 12.9; Magnesium 1.7; Platelets 413*; Potassium 3.7; Sodium 136   Recent Lipid Panel    Component Value Date/Time   CHOL 159 04/10/2012 0117   CHOL 184 08/06/2009 1136   TRIG 230* 04/10/2012 0117   TRIG 244.0* 08/06/2009 1136   HDL 34* 04/10/2012 0117   HDL 32.40* 08/06/2009 1136   CHOLHDL 6 08/06/2009 1136   VLDL 46* 04/10/2012 0117   VLDL 48.8* 08/06/2009 1136   LDLCALC 79 04/10/2012 0117   LDLCALC * 07/12/2009 0415    125        Total Cholesterol/HDL:CHD Risk Coronary Heart Disease Risk Table                     Men   Women  1/2 Average Risk   3.4   3.3  Average Risk       5.0   4.4  2 X Average Risk   9.6   7.1  3 X Average Risk  23.4   11.0        Use the calculated Patient Ratio above and the CHD Risk Table to determine the patient's CHD Risk.        ATP III CLASSIFICATION (LDL):  <100     mg/dL    Optimal  100-129  mg/dL   Near or Above  Optimal  130-159  mg/dL   Borderline  160-189  mg/dL   High  >190     mg/dL   Very High   LDLDIRECT 117.4 08/06/2009 1136    Additional studies/ records that were reviewed today include:   Myoview 7/13 Small apical defect with minimal improvement in the rest images.  Consistent with subendocardial scar with minimal periinfarct ischemia.  Review of raw data shows signif soft tissue (breast) attenuation.  Cannot exclude soft tissue attenuation with minimal shifting.    OVerall low risk scan.  LV Ejection Fraction: 72%.  LV Wall Motion:  NL LV Function; NL Wall Motion  Echo 9/10 EF 60%, normal wall motion  LHC 9/06 Preserved LV function LM okay LCx luminal irregularities LAD mid 30% RCA okay CONCLUSION: 1. Well-preserved overall left ventricular function. 2. Calcification of mid left anterior descending artery without critical stenosis. 3. Scattered luminal irregularities throughout the coronary system.   ASSESSMENT:     1. Persistent atrial fibrillation ([place])   2. OSA (obstructive sleep apnea)   3. Coronary artery disease involving native coronary artery of native heart without angina pectoris   4. Essential hypertension     PLAN:     In order of problems listed above:  1. Persistent atrial fibrillation  - Rate is better controlled. She feels that she did miss a dose of Eliquis in the past 7-10 days. She is more short of breath and possibly has some volume excess in the setting of prolonged atrial fibrillation. I think trying to get her back in sinus rhythm sooner rather than later is the appropriate approach. I discussed proceeding with TEE-DCCV with Dr. Rayann Heman, who agrees.  -  Arrange TEE-DCCV later this week (TEE b/c her anticoagulation has been interrupted in the past 3 weeks)  -  Continue Eliquis  -  Follow-up with me 1-2 weeks after cardioversion and Dr. Rayann Heman 6-8 weeks later  -  She may take when  necessary HCTZ 12.5 mg  2. OSA - She has been intolerant to CPAP in the past.  3. CAD - minimal plaque noted by cardiac catheterization in 2006. Continue statin.  4. HTN - Controlled.    Medication Adjustments/Labs and Tests Ordered: Current medicines are reviewed at length with the patient today.  Concerns regarding medicines are outlined above.  Medication changes, Labs and Tests ordered today are outlined in the Patient Instructions noted below. Patient Instructions  Medication Instructions:  1. TAKE HCTZ 12.5MG  TODAY; THEN TAKE ONLY AS NEEDED FOR SWELLING OR WEIGHT GAIN OF 2-3 LB'S IN DAY  Labwork: NONE  Testing/Procedures: Your physician has requested that you have a TEE/Cardioversion. During a TEE, sound waves are used to create images of your heart. It provides your doctor with information about the size and shape of your heart and how well your heart's chambers and valves are working. In this test, a transducer is attached to the end of a flexible tube that is guided down you throat and into your esophagus (the tube leading from your mouth to your stomach) to get a more detailed image of your heart. Once the TEE has determined that a blood clot is not present, the cardioversion begins. Electrical Cardioversion uses a jolt of electricity to your heart either through paddles or wired patches attached to your chest. This is a controlled, usually prescheduled, procedure. This procedure is done at the hospital and you are not awake during the procedure. You usually go home the day of the procedure. Please see  the instruction sheet given to you today for more information.  Follow-Up: 1. 02/07/16 @ 11:50 WITH Devonte Migues, PAC   2. I WILL HAVE DR. ALLRED'S SCHEDULER MELISSA TATUM CALL YOU WITH AN APPT FOR AN APPT IN 6-8 WEEKS    Any Other Special Instructions Will Be Listed Below (If Applicable).  If you need a refill on your cardiac medications before your next appointment, please call  your pharmacy.   Signed, Richardson Dopp, PA-C  01/21/2016 4:04 PM    [facility] [address], [place], [place]  [address] Phone: [phone]; Fax: [phone]

## 2016-01-21 ENCOUNTER — Ambulatory Visit (INDEPENDENT_AMBULATORY_CARE_PROVIDER_SITE_OTHER): Payer: 59 | Admitting: Physician Assistant

## 2016-01-21 ENCOUNTER — Telehealth: Payer: Self-pay | Admitting: *Deleted

## 2016-01-21 ENCOUNTER — Encounter: Payer: Self-pay | Admitting: Physician Assistant

## 2016-01-21 ENCOUNTER — Encounter: Payer: Self-pay | Admitting: *Deleted

## 2016-01-21 VITALS — BP 120/58 | HR 100 | Ht 66.5 in | Wt 242.4 lb

## 2016-01-21 DIAGNOSIS — G4733 Obstructive sleep apnea (adult) (pediatric): Secondary | ICD-10-CM | POA: Diagnosis not present

## 2016-01-21 DIAGNOSIS — I481 Persistent atrial fibrillation: Secondary | ICD-10-CM

## 2016-01-21 DIAGNOSIS — I4819 Other persistent atrial fibrillation: Secondary | ICD-10-CM

## 2016-01-21 DIAGNOSIS — I2510 Atherosclerotic heart disease of native coronary artery without angina pectoris: Secondary | ICD-10-CM

## 2016-01-21 DIAGNOSIS — I10 Essential (primary) hypertension: Secondary | ICD-10-CM | POA: Diagnosis not present

## 2016-01-21 LAB — BASIC METABOLIC PANEL
BUN: 10 mg/dL (ref 7–25)
CALCIUM: 8.6 mg/dL (ref 8.6–10.4)
CHLORIDE: 104 mmol/L (ref 98–110)
CO2: 28 mmol/L (ref 20–31)
CREATININE: 0.67 mg/dL (ref 0.50–0.99)
Glucose, Bld: 158 mg/dL — ABNORMAL HIGH (ref 65–99)
Potassium: 4.9 mmol/L (ref 3.5–5.3)
SODIUM: 142 mmol/L (ref 135–146)

## 2016-01-21 MED ORDER — HYDROCHLOROTHIAZIDE 12.5 MG PO CAPS: 12.5000 mg | ORAL_CAPSULE | ORAL | Status: DC

## 2016-01-21 NOTE — Patient Instructions (Addendum)
Medication Instructions:  1. TAKE HCTZ 12.5MG  TODAY; THEN TAKE ONLY AS NEEDED FOR SWELLING OR WEIGHT GAIN OF 2-3 LB'S IN DAY  Labwork: NONE  Testing/Procedures: Your physician has requested that you have a TEE/Cardioversion. During a TEE, sound waves are used to create images of your heart. It provides your doctor with information about the size and shape of your heart and how well your heart's chambers and valves are working. In this test, a transducer is attached to the end of a flexible tube that is guided down you throat and into your esophagus (the tube leading from your mouth to your stomach) to get a more detailed image of your heart. Once the TEE has determined that a blood clot is not present, the cardioversion begins. Electrical Cardioversion uses a jolt of electricity to your heart either through paddles or wired patches attached to your chest. This is a controlled, usually prescheduled, procedure. This procedure is done at the hospital and you are not awake during the procedure. You usually go home the day of the procedure. Please see the instruction sheet given to you today for more information.  Follow-Up: 1. 02/07/16 @ 11:50 WITH SCOTT WEAVER, PAC   2. I WILL HAVE DR. ALLRED'S SCHEDULER MELISSA TATUM CALL YOU WITH AN APPT FOR AN APPT IN 6-8 WEEKS    Any Other Special Instructions Will Be Listed Below (If Applicable).  If you need a refill on your cardiac medications before your next appointment, please call your pharmacy.

## 2016-01-21 NOTE — Telephone Encounter (Signed)
Pt has been notified of lab results by phone with verbal understanding. 

## 2016-01-22 NOTE — Anesthesia Preprocedure Evaluation (Addendum)
Anesthesia Evaluation  Patient identified by MRN, date of birth, ID band Patient awake    Reviewed: Allergy & Precautions, H&P , Patient's Chart, lab work & pertinent test results, reviewed documented beta blocker date and time   Airway Mallampati: II  TM Distance: >3 FB Neck ROM: full    Dental no notable dental hx.    Pulmonary sleep apnea , former smoker,    Pulmonary exam normal breath sounds clear to auscultation       Cardiovascular hypertension, Pt. on medications and Pt. on home beta blockers + CAD   Rhythm:regular Rate:Normal     Neuro/Psych    GI/Hepatic   Endo/Other  diabetes, Type 2  Renal/GU      Musculoskeletal   Abdominal   Peds  Hematology   Anesthesia Other Findings BS 161 today  Reproductive/Obstetrics                           Anesthesia Physical Anesthesia Plan  ASA: III  Anesthesia Plan: MAC   Post-op Pain Management:    Induction: Intravenous  Airway Management Planned: Mask and Natural Airway  Additional Equipment:   Intra-op Plan:   Post-operative Plan:   Informed Consent: I have reviewed the patients History and Physical, chart, labs and discussed the procedure including the risks, benefits and alternatives for the proposed anesthesia with the patient or authorized representative who has indicated his/her understanding and acceptance.   Dental Advisory Given  Plan Discussed with: CRNA and Surgeon  Anesthesia Plan Comments: (Discussed sedation and potential to need to place airway or ETT if warranted by clinical changes intra-operatively. We will start procedure as MAC.)        Anesthesia Quick Evaluation

## 2016-01-23 ENCOUNTER — Ambulatory Visit (HOSPITAL_COMMUNITY): Payer: 59 | Admitting: Anesthesiology

## 2016-01-23 ENCOUNTER — Encounter (HOSPITAL_COMMUNITY)
Admission: RE | Disposition: A | Discharge status: Home / Self Care | Payer: Self-pay | Source: Ambulatory Visit | Attending: Cardiology

## 2016-01-23 ENCOUNTER — Ambulatory Visit (HOSPITAL_COMMUNITY)
Admission: RE | Admit: 2016-01-23 | Discharge: 2016-01-23 | Disposition: A | Discharge status: Home / Self Care | Payer: 59 | Source: Ambulatory Visit | Attending: Cardiology | Admitting: Cardiology

## 2016-01-23 ENCOUNTER — Encounter (HOSPITAL_COMMUNITY): Payer: Self-pay

## 2016-01-23 ENCOUNTER — Ambulatory Visit (HOSPITAL_BASED_OUTPATIENT_CLINIC_OR_DEPARTMENT_OTHER): Payer: 59

## 2016-01-23 DIAGNOSIS — Z87891 Personal history of nicotine dependence: Secondary | ICD-10-CM | POA: Insufficient documentation

## 2016-01-23 DIAGNOSIS — I2510 Atherosclerotic heart disease of native coronary artery without angina pectoris: Secondary | ICD-10-CM | POA: Diagnosis not present

## 2016-01-23 DIAGNOSIS — E119 Type 2 diabetes mellitus without complications: Secondary | ICD-10-CM | POA: Diagnosis not present

## 2016-01-23 DIAGNOSIS — E669 Obesity, unspecified: Secondary | ICD-10-CM | POA: Diagnosis not present

## 2016-01-23 DIAGNOSIS — I481 Persistent atrial fibrillation: Secondary | ICD-10-CM

## 2016-01-23 DIAGNOSIS — Z79899 Other long term (current) drug therapy: Secondary | ICD-10-CM | POA: Diagnosis not present

## 2016-01-23 DIAGNOSIS — I4892 Unspecified atrial flutter: Secondary | ICD-10-CM

## 2016-01-23 DIAGNOSIS — I10 Essential (primary) hypertension: Secondary | ICD-10-CM | POA: Diagnosis not present

## 2016-01-23 DIAGNOSIS — G4733 Obstructive sleep apnea (adult) (pediatric): Secondary | ICD-10-CM | POA: Insufficient documentation

## 2016-01-23 DIAGNOSIS — Z7984 Long term (current) use of oral hypoglycemic drugs: Secondary | ICD-10-CM | POA: Diagnosis not present

## 2016-01-23 DIAGNOSIS — K219 Gastro-esophageal reflux disease without esophagitis: Secondary | ICD-10-CM | POA: Diagnosis not present

## 2016-01-23 DIAGNOSIS — Z7901 Long term (current) use of anticoagulants: Secondary | ICD-10-CM | POA: Diagnosis not present

## 2016-01-23 DIAGNOSIS — I4819 Other persistent atrial fibrillation: Secondary | ICD-10-CM

## 2016-01-23 DIAGNOSIS — F329 Major depressive disorder, single episode, unspecified: Secondary | ICD-10-CM | POA: Insufficient documentation

## 2016-01-23 HISTORY — PX: TEE WITHOUT CARDIOVERSION: SHX5443

## 2016-01-23 HISTORY — PX: CARDIOVERSION: SHX1299

## 2016-01-23 LAB — GLUCOSE, CAPILLARY: GLUCOSE-CAPILLARY: 161 mg/dL — AB (ref 65–99)

## 2016-01-23 SURGERY — ECHOCARDIOGRAM, TRANSESOPHAGEAL
Anesthesia: Monitor Anesthesia Care

## 2016-01-23 MED ORDER — PROPOFOL 500 MG/50ML IV EMUL
INTRAVENOUS | Status: DC | PRN
  Administered 2016-01-23: 50 ug/kg/min via INTRAVENOUS

## 2016-01-23 MED ORDER — SODIUM CHLORIDE 0.9 % IV SOLN: INTRAVENOUS | Status: DC

## 2016-01-23 MED ORDER — PROPOFOL 10 MG/ML IV BOLUS
INTRAVENOUS | Status: DC | PRN
  Administered 2016-01-23 (×2): 30 mg via INTRAVENOUS

## 2016-01-23 MED ORDER — LACTATED RINGERS IV SOLN
INTRAVENOUS | Status: DC
  Administered 2016-01-23: 1000 mL via INTRAVENOUS
  Administered 2016-01-23: 11:00:00 via INTRAVENOUS

## 2016-01-23 NOTE — Progress Notes (Signed)
  Echocardiogram Echocardiogram Transesophageal has been performed.  Jennette Dubin 01/23/2016, 11:53 AM

## 2016-01-23 NOTE — Anesthesia Postprocedure Evaluation (Signed)
Anesthesia Post Note  Patient: Kayla Hendrix  Procedure(s) Performed: Procedure(s) (LRB): TRANSESOPHAGEAL ECHOCARDIOGRAM (TEE) (N/A) CARDIOVERSION (N/A)  Patient location during evaluation: PACU Anesthesia Type: General Level of consciousness: sedated Pain management: satisfactory to patient Vital Signs Assessment: post-procedure vital signs reviewed and stable Respiratory status: spontaneous breathing Cardiovascular status: stable Anesthetic complications: no    Last Vitals:  Filed Vitals:   01/23/16 1140 01/23/16 1150  BP: 108/51 102/51  Pulse: 53 54  Temp:    Resp: 17 12    Last Pain: There were no vitals filed for this visit.               Riccardo Dubin

## 2016-01-23 NOTE — CV Procedure (Signed)
Procedure: TEE  Sedation: Per anesthesiology.  Indication: Atrial fibrillation.   Findings: Please see echo section for full report.  Normal LV size and systolic function, EF [id number].  Normal RV size and systolic function.  Moderate left atrial enlargement, no LA appendage thrombus.  Moderate right atrial enlargement.  Mild mitral regurgitation, trivial tricuspid regurgitation.  Trileaflet aortic valve with no stenosis or regurgitation.  Negative bubble study, no evidence for ASD or PFO.  There was mild plaque in the descending thoracic aorta, normal caliber.    May proceed with DCCV.  [name] 01/23/2016 11:13 AM

## 2016-01-23 NOTE — Transfer of Care (Signed)
Immediate Anesthesia Transfer of Care Note  Patient: Kayla Hendrix  Procedure(s) Performed: Procedure(s): TRANSESOPHAGEAL ECHOCARDIOGRAM (TEE) (N/A) CARDIOVERSION (N/A)  Patient Location: PACU  Anesthesia Type:MAC  Level of Consciousness: awake and alert   Airway & Oxygen Therapy: Patient Spontanous Breathing  Post-op Assessment: Report given to RN and Post -op Vital signs reviewed and stable  Post vital signs: stable  Last Vitals:  Filed Vitals:   01/23/16 1119 01/23/16 1120  BP: 100/53 100/53  Pulse: 51 51  Temp:    Resp: 23 23    Complications: No apparent anesthesia complications

## 2016-01-23 NOTE — Procedures (Signed)
Electrical Cardioversion Procedure Note Kayla Hendrix [id number] 04-11-1951  Procedure: Electrical Cardioversion Indications:  Atrial Flutter  Procedure Details Consent: Risks of procedure as well as the alternatives and risks of each were explained to the (patient/caregiver).  Consent for procedure obtained. Time Out: Verified patient identification, verified procedure, site/side was marked, verified correct patient position, special equipment/implants available, medications/allergies/relevent history reviewed, required imaging and test results available.  Performed  Patient placed on cardiac monitor, pulse oximetry, supplemental oxygen as necessary.  Sedation given: Propofol per anesthesiology Pacer pads placed anterior and posterior chest.  Cardioverted 1 time(s).  Cardioverted at [place].  Evaluation Findings: Post procedure EKG shows: NSR Complications: None Patient did tolerate procedure well.   Kayla Hendrix 01/23/2016, 11:13 AM

## 2016-01-23 NOTE — H&P (View-Only) (Signed)
Cardiology Office Note:    Date:  01/21/2016   ID:  Kayla Hendrix, DOB 30-Aug-1951, MRN [id number]  PCP:  Criselda Peaches, MD  Cardiologist:  Dr. Thompson Grayer   Electrophysiologist:  Dr. Thompson Grayer   Chief Complaint  Patient presents with  . Atrial Fibrillation    ECG - Follow up (? set up DCCV)    History of Present Illness:     Kayla Hendrix is a 65 y.o. female with a hx of persistent atrial fibrillation, OSA, obesity, HTN, GERD, nonobstructive CAD by cardiac catheterization in 2006. Patient has a prior history of syncope in 5/13 with concerns of possible rectal bleeding. Flexible sigmoidoscopy demonstrated signs of ischemic bowel. There was no obvious source of bleeding. She has had no further bleeding. She was placed on Xarelto in 8/14 by Dr. Rayann Heman. However, the patient discontinued this. She was seen in the office in 2/17 with AF with RVR. She was placed on Cardizem for rate control as well as metoprolol. She was placed on Apixaban for anticoagulation. She was seen by Nell Range, PA-C in follow-up. Rate controlling medications were adjusted. Last seen 01/10/16. Heart rate remained uncontrolled. She returns for close follow-up with an eye towards cardioversion if she remains in atrial fibrillation.  She returns for FU. Here alone. She has felt more short of breath over the past week.  She denies orthopnea.  Denies LE edema.  She may have some increased abdominal girth.  Weight is up 6 lbs since last visit.  She denies syncope.  She has some atypical chest pain that is chronic and related to her GERD. She denies any changes. Denies any bleeding issues.    Past Medical History  Diagnosis Date  . OSA (obstructive sleep apnea)     noncompliant with CPAP  . Glucose intolerance (impaired glucose tolerance)   . Atrial fibrillation (HCC)     paroxysmal only asa 81 mg, no other blood thinner, the xarelto she was on gave her HAs  . Obesity   . HL (hearing loss)   . HTN  (hypertension)   . GERD (gastroesophageal reflux disease)   . CAD (coronary artery disease)     cath 2006- nonobstructive CAD  . Depression   . Diabetes mellitus without complication [facility])     Past Surgical History  Procedure Laterality Date  . Total abdominal hysterectomy    . Cesarean section      Current Medications: Outpatient Prescriptions Prior to Visit  Medication Sig Dispense Refill  . apixaban (ELIQUIS) 5 MG TABS tablet Take 1 tablet (5 mg total) by mouth 2 (two) times daily. 60 tablet 11  . clonazePAM (KLONOPIN) 0.5 MG tablet Take 0.5 mg by mouth 3 (three) times daily.      Marland Kitchen diltiazem (CARDIZEM CD) 360 MG 24 hr capsule Take 1 capsule (360 mg total) by mouth daily. 30 capsule 11  . FLUoxetine (PROZAC) 20 MG capsule Take 20 mg by mouth daily.      . metFORMIN (GLUCOPHAGE) 500 MG tablet Take 1,000 mg by mouth 2 (two) times daily with a meal.     . omeprazole (PRILOSEC) 20 MG capsule Take 20 mg by mouth 2 (two) times daily.      . simvastatin (ZOCOR) 10 MG tablet TAKE 1 TABLET BY MOUTH AT BEDTIME 30 tablet 6  . metoprolol tartrate 37.5 MG TABS Take 37.5 mg by mouth 2 (two) times daily. 180 tablet 0   No facility-administered medications prior to visit.  Allergies:   Hydrocodone   Social History   Social History  . Marital Status: Single    Spouse Name: N/A  . Number of Children: N/A  . Years of Education: N/A   Social History Main Topics  . Smoking status: Former Research scientist (life sciences)  . Smokeless tobacco: Never Used     Comment: 15-pack-year smoker  . Alcohol Use: No  . Drug Use: No  . Sexual Activity: Not Asked   Other Topics Concern  . None   Social History Narrative   Pt. Lives in [place] with her son. 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      Family History:  The patient's family history includes Cancer in her father.   ROS:   Please see the history of present illness.      Review of Systems  Cardiovascular: Positive for dyspnea on exertion and irregular heartbeat.  Respiratory: Positive for shortness of breath.    All other systems reviewed and are negative.   Physical Exam:    VS:  BP 120/58 mmHg  Pulse 100  Ht 5' 6.5" (1.689 m)  Wt 242 lb 6.4 oz (109.952 kg)  BMI 38.54 kg/m2   GEN: Well nourished, well developed, in no acute distress HEENT: normal Neck: no JVD, no masses Cardiac: Normal S1/S2, irreg irreg rhythm; no murmurs, rubs, or gallops, no edema   Respiratory:  clear to auscultation bilaterally; no wheezing, rhonchi or rales GI: soft, nontender, nondistended MS: no deformity or atrophy Skin: warm and dry Neuro: No focal deficits  Psych: Alert and oriented x 3, normal affect  Wt Readings from Last 3 Encounters:  01/21/16 242 lb 6.4 oz (109.952 kg)  01/10/16 236 lb (107.049 kg)  01/07/16 233 lb 12.8 oz (106.051 kg)      Studies/Labs Reviewed:     EKG:  EKG is  ordered today.  The ekg ordered today demonstrates atrial fibrillation, HR 99, low voltage  Recent Labs: 01/02/2016: BUN 14; Creat 0.78; Hemoglobin 12.9; Magnesium 1.7; Platelets 413*; Potassium 3.7; Sodium 136   Recent Lipid Panel    Component Value Date/Time   CHOL 159 04/10/2012 0117   CHOL 184 08/06/2009 1136   TRIG 230* 04/10/2012 0117   TRIG 244.0* 08/06/2009 1136   HDL 34* 04/10/2012 0117   HDL 32.40* 08/06/2009 1136   CHOLHDL 6 08/06/2009 1136   VLDL 46* 04/10/2012 0117   VLDL 48.8* 08/06/2009 1136   LDLCALC 79 04/10/2012 0117   LDLCALC * 07/12/2009 0415    125        Total Cholesterol/HDL:CHD Risk Coronary Heart Disease Risk Table                     Men   Women  1/2 Average Risk   3.4   3.3  Average Risk       5.0   4.4  2 X Average Risk   9.6   7.1  3 X Average Risk  23.4   11.0        Use the calculated Patient Ratio above and the CHD Risk Table to determine the patient's CHD Risk.        ATP III CLASSIFICATION (LDL):  <100     mg/dL    Optimal  100-129  mg/dL   Near or Above  Optimal  130-159  mg/dL   Borderline  160-189  mg/dL   High  >190     mg/dL   Very High   LDLDIRECT 117.4 08/06/2009 1136    Additional studies/ records that were reviewed today include:   Myoview 7/13 Small apical defect with minimal improvement in the rest images.  Consistent with subendocardial scar with minimal periinfarct ischemia.  Review of raw data shows signif soft tissue (breast) attenuation.  Cannot exclude soft tissue attenuation with minimal shifting.    OVerall low risk scan.  LV Ejection Fraction: 72%.  LV Wall Motion:  NL LV Function; NL Wall Motion  Echo 9/10 EF 60%, normal wall motion  LHC 9/06 Preserved LV function LM okay LCx luminal irregularities LAD mid 30% RCA okay CONCLUSION: 1. Well-preserved overall left ventricular function. 2. Calcification of mid left anterior descending artery without critical stenosis. 3. Scattered luminal irregularities throughout the coronary system.   ASSESSMENT:     1. Persistent atrial fibrillation ([place])   2. OSA (obstructive sleep apnea)   3. Coronary artery disease involving native coronary artery of native heart without angina pectoris   4. Essential hypertension     PLAN:     In order of problems listed above:  1. Persistent atrial fibrillation  - Rate is better controlled. She feels that she did miss a dose of Eliquis in the past 7-10 days. She is more short of breath and possibly has some volume excess in the setting of prolonged atrial fibrillation. I think trying to get her back in sinus rhythm sooner rather than later is the appropriate approach. I discussed proceeding with TEE-DCCV with Dr. Rayann Heman, who agrees.  -  Arrange TEE-DCCV later this week (TEE b/c her anticoagulation has been interrupted in the past 3 weeks)  -  Continue Eliquis  -  Follow-up with me 1-2 weeks after cardioversion and Dr. Rayann Heman 6-8 weeks later  -  She may take when  necessary HCTZ 12.5 mg  2. OSA - She has been intolerant to CPAP in the past.  3. CAD - minimal plaque noted by cardiac catheterization in 2006. Continue statin.  4. HTN - Controlled.    Medication Adjustments/Labs and Tests Ordered: Current medicines are reviewed at length with the patient today.  Concerns regarding medicines are outlined above.  Medication changes, Labs and Tests ordered today are outlined in the Patient Instructions noted below. Patient Instructions  Medication Instructions:  1. TAKE HCTZ 12.5MG  TODAY; THEN TAKE ONLY AS NEEDED FOR SWELLING OR WEIGHT GAIN OF 2-3 LB'S IN DAY  Labwork: NONE  Testing/Procedures: Your physician has requested that you have a TEE/Cardioversion. During a TEE, sound waves are used to create images of your heart. It provides your doctor with information about the size and shape of your heart and how well your heart's chambers and valves are working. In this test, a transducer is attached to the end of a flexible tube that is guided down you throat and into your esophagus (the tube leading from your mouth to your stomach) to get a more detailed image of your heart. Once the TEE has determined that a blood clot is not present, the cardioversion begins. Electrical Cardioversion uses a jolt of electricity to your heart either through paddles or wired patches attached to your chest. This is a controlled, usually prescheduled, procedure. This procedure is done at the hospital and you are not awake during the procedure. You usually go home the day of the procedure. Please see  the instruction sheet given to you today for more information.  Follow-Up: 1. 02/07/16 @ 11:50 WITH Angeletta Goelz, PAC   2. I WILL HAVE DR. ALLRED'S SCHEDULER MELISSA TATUM CALL YOU WITH AN APPT FOR AN APPT IN 6-8 WEEKS    Any Other Special Instructions Will Be Listed Below (If Applicable).  If you need a refill on your cardiac medications before your next appointment, please call  your pharmacy.   Signed, Richardson Dopp, PA-C  01/21/2016 4:04 PM    [facility] [place], [place], [place]  [address] Phone: [phone]; Fax: [phone]

## 2016-01-23 NOTE — Discharge Instructions (Signed)
Electrical Cardioversion, Care After °Refer to this sheet in the next few weeks. These instructions provide you with information on caring for yourself after your procedure. Your health care provider may also give you more specific instructions. Your treatment has been planned according to current medical practices, but problems sometimes occur. Call your health care provider if you have any problems or questions after your procedure. °WHAT TO EXPECT AFTER THE PROCEDURE °After your procedure, it is typical to have the following sensations: °· Some redness on the skin where the shocks were delivered. If this is tender, a sunburn lotion or hydrocortisone cream may help. °· Possible return of an abnormal heart rhythm within hours or days after the procedure. °HOME CARE INSTRUCTIONS °· Take medicines only as directed by your health care provider. Be sure you understand how and when to take your medicine. °· Learn how to feel your pulse and check it often. °· Limit your activity for 48 hours after the procedure or as directed by your health care provider. °· Avoid or minimize caffeine and other stimulants as directed by your health care provider. °SEEK MEDICAL CARE IF: °· You feel like your heart is beating too fast or your pulse is not regular. °· You have any questions about your medicines. °· You have bleeding that will not stop. °SEEK IMMEDIATE MEDICAL CARE IF: °· You are dizzy or feel faint. °· It is hard to breathe or you feel short of breath. °· There is a change in discomfort in your chest. °· Your speech is slurred or you have trouble moving an arm or leg on one side of your body. °· You get a serious muscle cramp that does not go away. °· Your fingers or toes turn cold or blue. °  °This information is not intended to replace advice given to you by your health care provider. Make sure you discuss any questions you have with your health care provider. °  °Document Released: 08/17/2013 Document Revised: 11/17/2014  Document Reviewed: 08/17/2013 °Elsevier Interactive Patient Education ©2016 Elsevier Inc[employer] ° °

## 2016-01-23 NOTE — Interval H&P Note (Signed)
History and Physical Interval Note:  01/23/2016 10:56 AM  Kayla Hendrix  has presented today for surgery, with the diagnosis of AFIB  The various methods of treatment have been discussed with the patient and family. After consideration of risks, benefits and other options for treatment, the patient has consented to  Procedure(s): TRANSESOPHAGEAL ECHOCARDIOGRAM (TEE) (N/A) CARDIOVERSION (N/A) as a surgical intervention .  The patient's history has been reviewed, patient examined, no change in status, stable for surgery.  I have reviewed the patient's chart and labs.  Questions were answered to the patient's satisfaction.     D[place]

## 2016-01-24 ENCOUNTER — Encounter (HOSPITAL_COMMUNITY): Payer: Self-pay | Admitting: Cardiology

## 2016-01-25 ENCOUNTER — Telehealth: Payer: Self-pay | Admitting: Internal Medicine

## 2016-01-25 NOTE — Telephone Encounter (Signed)
New Message:   Pt had a cardioversion on Wednesday,she says she is still short of breath. She wants to know how long should this last and is this normal?

## 2016-01-25 NOTE — Telephone Encounter (Signed)
Patient is concerned that she still has SOB. Patient stated her SOB has improved since cardioversion, but she is not back to her normal. BP 121/64 HR 58. Consulted DOD, Dr. Caryl Comes, he recommend patient come in for a nurse visit for EKG to see what rhythm patient is in.   Called patient back and gave her Dr. Olin Pia recommendation. Patient stated that she is not out of rhythm, because her BP machine tells her if she is. Since patient thinks she is in NS rhythm, she thinks there is no need for an EKG. Informed patient if her SOB gets worse and she feels like she is in A. Fib to go to ED. Reminded patient of her follow-up appointment with Richardson Dopp PA. Patient verbalized understanding.

## 2016-01-30 ENCOUNTER — Encounter: Payer: Self-pay | Admitting: Physician Assistant

## 2016-01-30 ENCOUNTER — Telehealth: Payer: Self-pay | Admitting: Internal Medicine

## 2016-01-30 NOTE — Telephone Encounter (Signed)
Continue mediations as prescribed until follow up

## 2016-01-30 NOTE — Telephone Encounter (Signed)
New message  Pt request a call back to disucss if she should stay on her medications until April. Please call back to discuss

## 2016-02-04 ENCOUNTER — Telehealth: Payer: Self-pay | Admitting: Internal Medicine

## 2016-02-04 NOTE — Telephone Encounter (Signed)
New Message: ° °Please call,concerning her Diltiazem. ° ° °

## 2016-02-04 NOTE — Telephone Encounter (Signed)
Spoke with patient and she went to pick up her prescription for Verapamil 360 and it had gone from $5 to $60.  She did not pick up and went back to the 240mg  daily.  She is going to stay on this dose until her follow up appointment in April and call back if she has any problems.  I let her know I would discuss with Dr Rayann Heman on Wed when he is back in the office

## 2016-02-07 ENCOUNTER — Ambulatory Visit: Payer: 59 | Admitting: Physician Assistant

## 2016-02-07 NOTE — Telephone Encounter (Signed)
Tried patient at work and was unable to reach.  Called her home and left a message that Dr Rayann Heman was okay with her taking the lower dose of medication until her follow up

## 2016-03-07 ENCOUNTER — Ambulatory Visit (INDEPENDENT_AMBULATORY_CARE_PROVIDER_SITE_OTHER): Payer: 59 | Admitting: Internal Medicine

## 2016-03-07 ENCOUNTER — Encounter: Payer: Self-pay | Admitting: Internal Medicine

## 2016-03-07 VITALS — BP 150/76 | HR 69 | Ht 66.5 in | Wt 236.2 lb

## 2016-03-07 DIAGNOSIS — G4730 Sleep apnea, unspecified: Secondary | ICD-10-CM | POA: Diagnosis not present

## 2016-03-07 DIAGNOSIS — I480 Paroxysmal atrial fibrillation: Secondary | ICD-10-CM

## 2016-03-07 DIAGNOSIS — G4733 Obstructive sleep apnea (adult) (pediatric): Secondary | ICD-10-CM

## 2016-03-07 DIAGNOSIS — I10 Essential (primary) hypertension: Secondary | ICD-10-CM

## 2016-03-07 NOTE — Progress Notes (Signed)
PCP: Criselda Peaches, MD   Kayla Hendrix is a 65 y.o. female who presents today for routine electrophysiology followup.  She recently had afib and required cardioversion. She was quite symptomatic with fatigue and SOB.  This was her first episode in more than 3 years.  We did not start AAD therapy.  She is doing well.  She is not taking eliquis due to costs.  She does not wish to consider other anticoagulatants (has not previously tolerated xarelto).  Does not use CPAP due to mask intolerance.   Today, she denies symptoms of palpitations, chest pain, shortness of breath,  lower extremity edema, dizziness, presyncope, or syncope.  The patient is otherwise without complaint today.   Past Medical History  Diagnosis Date  . OSA (obstructive sleep apnea)     noncompliant with CPAP  . Glucose intolerance (impaired glucose tolerance)   . Atrial fibrillation (HCC)     paroxysmal only asa 81 mg, no other blood thinner, the xarelto she was on gave her HAs  . Obesity   . HL (hearing loss)   . HTN (hypertension)   . GERD (gastroesophageal reflux disease)   . CAD (coronary artery disease)     cath 2006- nonobstructive CAD  . Depression   . Diabetes mellitus without complication ([place])   . History of transesophageal echocardiography (TEE) for monitoring     TEE 3/17:  EF 55%, no RWMA, mild plaque in descending aorta, mild MR, mod BAE, normal RVF   Past Surgical History  Procedure Laterality Date  . Total abdominal hysterectomy    . Cesarean section    . Tee without cardioversion N/A 01/23/2016    Procedure: TRANSESOPHAGEAL ECHOCARDIOGRAM (TEE);  Surgeon: Larey Dresser, MD;  Location: [facility];  Service: Cardiovascular;  Laterality: N/A;  . Cardioversion N/A 01/23/2016    Procedure: CARDIOVERSION;  Surgeon: Larey Dresser, MD;  Location: [facility];  Service: Cardiovascular;  Laterality: N/A;    Current Outpatient Prescriptions  Medication Sig Dispense Refill  . apixaban (ELIQUIS) 5 MG  TABS tablet Take 1 tablet (5 mg total) by mouth 2 (two) times daily. 60 tablet 11  . clonazePAM (KLONOPIN) 0.5 MG tablet Take 0.5 mg by mouth 3 (three) times daily as needed for anxiety.     Marland Kitchen diltiazem (CARDIZEM CD) 240 MG 24 hr capsule Take 240 mg by mouth daily.    Marland Kitchen FLUoxetine (PROZAC) 20 MG capsule Take 20 mg by mouth daily.      . hydrochlorothiazide (HYDRODIURIL) 25 MG tablet Take 25 mg by mouth daily.    . metFORMIN (GLUCOPHAGE) 500 MG tablet Take 1,000 mg by mouth 2 (two) times daily with a meal.     . METOPROLOL TARTRATE PO Take 50 mg by mouth 2 (two) times daily.    Marland Kitchen omeprazole (PRILOSEC) 20 MG capsule Take 20 mg by mouth 2 (two) times daily.      . simvastatin (ZOCOR) 10 MG tablet TAKE 1 TABLET BY MOUTH AT BEDTIME 30 tablet 6   No current facility-administered medications for this visit.    Physical Exam: Filed Vitals:   03/07/16 1605  BP: 150/76  Pulse: 69  Height: 5' 6.5" (1.689 m)  Weight: 236 lb 3.2 oz (107.14 kg)    GEN- The patient is well appearing, alert and oriented x 3 today.   Head- normocephalic, atraumatic Eyes-  Sclera clear, conjunctiva pink Ears- hearing intact Oropharynx- clear Lungs- Clear to ausculation bilaterally, normal work of breathing Heart- Regular rate  and rhythm, no murmurs, rubs or gallops, PMI not laterally displaced GI- soft, NT, ND, + BS Extremities- no clubbing, cyanosis, or edema  ekg today reveals sinus rhythm 69 bpm, incomplete RBBB  Assessment and Plan:   1. afib Maintaining sinus after recent cardioversion Importance of lifestyle modification stressed today.  I worry that she is not motivated.  I have also given her a flyer to our May weight loss program and encouraged weight reduction.   CHADS2VASC score is at least 3.  Importance of compliance with eliquis was stressed today.  I have given her a patient assist card.  We also discussed watchman as an alternative due to noncompliance and patient preference.  Currently, she  wishes to try to take eliquis.  2. HTN Weight loss advised  3. Obesity Weight loss  4. OSA Noncompliant with CPAP She wishes to consider alternatives to CPAP I will refer to Dr Oneal Grout for consideration of OSA options  Follow-up with Roderic Palau NP in the AF clinic in 3 months  Thompson Grayer MD, [facility] 03/07/2016 5:02 PM

## 2016-03-07 NOTE — Patient Instructions (Signed)
Medication Instructions:  Your physician recommends that you continue on your current medications as directed. Please refer to the Current Medication list given to you today.   Labwork: None ordered   Testing/Procedures: You have been referred to Dr Sharyne Peach apnea   Follow-Up: Your physician recommends that you schedule a follow-up appointment in: 3 months with Roderic Palau, NP   Any Other Special Instructions Will Be Listed Below (If Applicable).     If you need a refill on your cardiac medications before your next appointment, please call your pharmacy.

## 2016-05-05 ENCOUNTER — Other Ambulatory Visit: Payer: Self-pay | Admitting: Internal Medicine

## 2016-05-05 DIAGNOSIS — R921 Mammographic calcification found on diagnostic imaging of breast: Secondary | ICD-10-CM

## 2016-05-19 ENCOUNTER — Ambulatory Visit
Admission: RE | Admit: 2016-05-19 | Discharge: 2016-05-19 | Disposition: A | Discharge status: Home / Self Care | Payer: 59 | Source: Ambulatory Visit | Attending: Internal Medicine | Admitting: Internal Medicine

## 2016-05-19 DIAGNOSIS — R921 Mammographic calcification found on diagnostic imaging of breast: Secondary | ICD-10-CM

## 2016-05-23 ENCOUNTER — Telehealth: Payer: Self-pay | Admitting: *Deleted

## 2016-05-23 NOTE — Telephone Encounter (Signed)
I am covering Kayla Hendrix's box. Please get more info before refilling. Last OV with Dr. Rayann Hendrix 02/2016 said 50mg  BID but I agree, I do not see where this was formally changed by [place]. Need to find out what BP and HR have been running on the dose she has been taking.

## 2016-05-23 NOTE — Telephone Encounter (Signed)
Patient called in today for her metoprolol that she is out of. She states that she takes her metoprolol 2 tabs bid. I was unable to contact her by phone or lv a message on her vm. I reached out to her son also but was told I had the wrong number. I read through the chart review for answers but found no mention of where the change was made from 37.5 mg to 50 mg bid.  Please advise.

## 2016-05-23 NOTE — Telephone Encounter (Signed)
Tried calling the patient but no answer or voicemail

## 2016-05-26 ENCOUNTER — Telehealth: Payer: Self-pay | Admitting: *Deleted

## 2016-05-26 ENCOUNTER — Other Ambulatory Visit: Payer: Self-pay | Admitting: *Deleted

## 2016-05-26 MED ORDER — METOPROLOL TARTRATE 50 MG PO TABS
50.0000 mg | ORAL_TABLET | Freq: Two times a day (BID) | ORAL | Status: DC
Start: 2016-05-26 — End: 2017-02-13

## 2016-05-26 NOTE — Telephone Encounter (Signed)
Spoke with patient and she stated that her HR has been >60. She will continue to monitor her BP and contact her pcp to discuss. She is aware that I will send in an rx for the 50 mg tablets and she will take one tablet twice a day.

## 2016-05-26 NOTE — Telephone Encounter (Signed)
As long as HR>60, OK to refill at 50mg  BID.  Please have her let PCP know BPs still running higher. ThxLisbeth Renshaw Jona Zappone PA-C

## 2016-05-26 NOTE — Telephone Encounter (Signed)
Patient called about the metoprolol refill (see phone note from 05/23/16). She stated that she has been taking 50 mg bid. Her blood pressures have been in the 140-150/80's range. She was seen by her pcp at the end of May and he put her back on lisinopril as her bp was elevated at that visit. Please advise on dose. Thanks, MI

## 2016-11-10 DIAGNOSIS — K2270 Barrett's esophagus without dysplasia: Secondary | ICD-10-CM

## 2016-11-10 HISTORY — DX: Barrett's esophagus without dysplasia: K22.70

## 2017-02-13 ENCOUNTER — Other Ambulatory Visit: Payer: Self-pay | Admitting: Internal Medicine

## 2017-04-15 ENCOUNTER — Encounter: Payer: Self-pay | Admitting: Physician Assistant

## 2017-04-22 ENCOUNTER — Encounter: Payer: Self-pay | Admitting: Physician Assistant

## 2017-04-22 ENCOUNTER — Ambulatory Visit (INDEPENDENT_AMBULATORY_CARE_PROVIDER_SITE_OTHER): Payer: 59 | Admitting: Physician Assistant

## 2017-04-22 ENCOUNTER — Telehealth: Payer: Self-pay

## 2017-04-22 VITALS — BP 142/64 | HR 74 | Ht 65.0 in | Wt 243.6 lb

## 2017-04-22 DIAGNOSIS — D509 Iron deficiency anemia, unspecified: Secondary | ICD-10-CM

## 2017-04-22 DIAGNOSIS — R7989 Other specified abnormal findings of blood chemistry: Secondary | ICD-10-CM

## 2017-04-22 DIAGNOSIS — R197 Diarrhea, unspecified: Secondary | ICD-10-CM | POA: Diagnosis not present

## 2017-04-22 DIAGNOSIS — Z7901 Long term (current) use of anticoagulants: Secondary | ICD-10-CM

## 2017-04-22 DIAGNOSIS — R945 Abnormal results of liver function studies: Secondary | ICD-10-CM

## 2017-04-22 MED ORDER — NA SULFATE-K SULFATE-MG SULF 17.5-3.13-1.6 GM/177ML PO SOLN: 1.0000 | Freq: Once | ORAL | 0 refills | Status: AC

## 2017-04-22 NOTE — Telephone Encounter (Signed)
Check on repeat labs (LFTs) Sept 13,2018.

## 2017-04-22 NOTE — Patient Instructions (Signed)
If you are age 65 or older, your body mass index should be between 23-30. Your Body mass index is 40.54 kg/m. If this is out of the aforementioned range listed, please consider follow up with your Primary Care Provider.  If you are age 60 or younger, your body mass index should be between 19-25. Your Body mass index is 40.54 kg/m. If this is out of the aformentioned range listed, please consider follow up with your Primary Care Provider.   You have been scheduled for an endoscopy and colonoscopy. Please follow the written instructions given to you at your visit today. Please pick up your prep supplies at the pharmacy within the next 1-3 days. If you use inhalers (even only as needed), please bring them with you on the day of your procedure. Your physician has requested that you go to www.startemmi.com and enter the access code given to you at your visit today. This web site gives a general overview about your procedure. However, you should still follow specific instructions given to you by our office regarding your preparation for the procedure.  You will be contacted by our office prior to your procedure for directions on holding your Eliquis.  If you do not hear from our office 1 week prior to your scheduled procedure, please call [phone] to discuss.    Your physician has requested that you go to the basement for the following lab work ( LFTs) in three months.  Sept. 13, 2018.  Start over-the-counter Iron supplement daily.  Thank you for choosing me and [facility].  Ellouise Newer, PA-C

## 2017-04-22 NOTE — Progress Notes (Addendum)
Chief Complaint: Iron deficiency anemia, elevated LFTs  HPI:  Kayla Hendrix is a 66 year old Caucasian female with a past medical history of A. fib maintained on Eliquis (last TEE 01/23/16 with an EF of 55%), CAD, diabetes, GERD, hypertension and obstructive sleep apnea,  who was referred to me by Levin Erp, MD for a complaint of iron deficiency anemia and elevated liver function tests.     Per referring physician's notes. Patient had a recent labs drawn 04/08/17 which showed a minimally elevated AST at 62 and ALT at 49. CMP was otherwise normal other than a glucose of 327 and an A1c of 11.7. Patient also had a CBC showing hemoglobin minimally decreased at 11.5 with an MCV low at 76.5. She then had iron studies with an iron low at 34 and percent saturation low at 7%.   Today, the patient presents to clinic and tells me that she knows that she is anemic. She was instructed to buy over-the-counter iron which she has not yet started because "I wanted to see what you would say". Per her, the patient's last full colonoscopy was in 2005, she is unaware of results. We do not have report of this. Patient then had a flex sigmoidoscopy completed in 2013 by Dr. Gustavo Lah for symptoms of ischemic colitis. This confirmed that diagnosis. She was instructed to have a full colonoscopy 4 weeks later but never did this. Currently, the patient tells me that she does get somewhat short of breath with walking around for a long period of time. She is also somewhat lightheaded when she stands up "sometimes". She denies seeing any bright red blood or melenic stools.   Patient does tell me that she has almost constant diarrhea. She typically has an urgent looser stool which sometimes has form and other times does not immediately after eating. She is maintained on Metformin for her diabetes. She tells me though that her diabetes "isn't controlled anyways".   Patient also briefly tells me that she was found have liver enzymes that  were elevated, though she does not think this has occurred previously on her other labs. This was "the first time".   Patient's medical history is positive for being on Eliquis for A. Fib.   Patient denies fever, chills, weight loss, fatigue, anorexia, nausea, vomiting, heartburn, reflux, dysphagia or abdominal pain.  Past Medical History:  Diagnosis Date  . Atrial fibrillation (HCC)    paroxysmal only asa 81 mg, no other blood thinner, the xarelto she was on gave her HAs  . CAD (coronary artery disease)    cath 2006- nonobstructive CAD  . Depression   . Diabetes mellitus without complication ([facility])   . GERD (gastroesophageal reflux disease)   . Glucose intolerance (impaired glucose tolerance)   . History of transesophageal echocardiography (TEE) for monitoring    TEE 3/17:  EF 55%, no RWMA, mild plaque in descending aorta, mild MR, mod BAE, normal RVF  . HL (hearing loss)   . HTN (hypertension)   . Obesity   . OSA (obstructive sleep apnea)    noncompliant with CPAP    Past Surgical History:  Procedure Laterality Date  . CARDIOVERSION N/A 01/23/2016   Procedure: CARDIOVERSION;  Surgeon: Larey Dresser, MD;  Location: [facility];  Service: Cardiovascular;  Laterality: N/A;  . CESAREAN SECTION    . TEE WITHOUT CARDIOVERSION N/A 01/23/2016   Procedure: TRANSESOPHAGEAL ECHOCARDIOGRAM (TEE);  Surgeon: Larey Dresser, MD;  Location: [facility];  Service: Cardiovascular;  Laterality: N/A;  .  TOTAL ABDOMINAL HYSTERECTOMY      Current Outpatient Prescriptions  Medication Sig Dispense Refill  . apixaban (ELIQUIS) 5 MG TABS tablet Take 1 tablet (5 mg total) by mouth 2 (two) times daily. 60 tablet 11  . clonazePAM (KLONOPIN) 0.5 MG tablet Take 0.5 mg by mouth 3 (three) times daily as needed for anxiety.     Marland Kitchen diltiazem (CARDIZEM CD) 240 MG 24 hr capsule Take 240 mg by mouth daily.    Marland Kitchen FLUoxetine (PROZAC) 20 MG capsule Take 20 mg by mouth daily.      . hydrochlorothiazide (HYDRODIURIL)  25 MG tablet Take 25 mg by mouth daily.    . metFORMIN (GLUCOPHAGE) 500 MG tablet Take 1,000 mg by mouth 2 (two) times daily with a meal.     . metoprolol (LOPRESSOR) 50 MG tablet TAKE 1 TABLET (50 MG TOTAL) BY MOUTH 2 (TWO) TIMES DAILY. 60 tablet 7  . omeprazole (PRILOSEC) 20 MG capsule Take 20 mg by mouth 2 (two) times daily.      . simvastatin (ZOCOR) 10 MG tablet TAKE 1 TABLET BY MOUTH AT BEDTIME 30 tablet 6   No current facility-administered medications for this visit.     Allergies as of 04/22/2017 - Review Complete 05/19/2016  Allergen Reaction Noted  . Hydrocodone Itching 05/09/2014    Family History  Problem Relation Age of Onset  . Diabetes Unknown        noninsulin dependant DM  . Hypertension Unknown   . Cancer Father     Social History   Social History  . Marital status: Single    Spouse name: N/A  . Number of children: N/A  . Years of education: N/A   Occupational History  . Not on file.   Social History Main Topics  . Smoking status: Former Research scientist (life sciences)  . Smokeless tobacco: Never Used     Comment: 15-pack-year smoker  . Alcohol use No  . Drug use: No  . Sexual activity: Not on file   Other Topics Concern  . Not on file   Social History Narrative   Pt. Lives in [place] with her son. 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     Review of Systems:    Constitutional: No weight loss, fever or chills Skin: No rash  Cardiovascular: No chest pain Respiratory: Chronic SOB Gastrointestinal: See HPI and otherwise negative Genitourinary: No dysuria  Neurological: No headache Musculoskeletal: No new muscle or joint pain Hematologic: No bleeding  Psychiatric: No history of depression or anxiety   Physical Exam:  Vital signs: BP (!) 142/64   Pulse 74   Ht 5\' 5"  (1.651 m)   Wt 243 lb 9.6 oz (110.5 kg)   SpO2 94%   BMI 40.54 kg/m    Constitutional:   Pleasant obese Caucasian  female appears to be in NAD, Well developed, Well nourished, alert and cooperative Head:  Normocephalic and atraumatic. Eyes:   PEERL, EOMI. No icterus. Conjunctiva pink. Ears:  Normal auditory acuity. Neck:  Supple Throat: Oral cavity and pharynx without inflammation, swelling or lesion.  Respiratory: Respirations even and unlabored. Lungs clear to auscultation bilaterally.   No wheezes, crackles, or rhonchi.  Cardiovascular: Normal S1, S2. No MRG. Regular rate and rhythm. No peripheral edema, cyanosis or pallor.  Gastrointestinal:  Obese, Soft, nondistended, nontender. No rebound or guarding. Normal bowel sounds. No appreciable masses or hepatomegaly. Rectal:  Not performed.  Msk:  Symmetrical without gross deformities. Without edema, no deformity or joint abnormality.  Neurologic:  Alert and  oriented x4;  grossly normal neurologically.  Skin:   Dry and intact without significant lesions or rashes. Psychiatric:  Demonstrates good judgement and reason without abnormal affect or behaviors.  See HPI for relevant recent labs.  Assessment: 1. IDA: Patient with a hemoglobin around 11 and iron deficiency; consider upper versus lower GI source of blood loss/malabsorption versus hematologic etiology 2. Elevated LFTs: One time elevation, only minimal elevation in AST and ALT; strong suspicion for fatty liver 3. Diarrhea: Somewhat chronic over the past year or more per the patient, some formed stools in between days of urgent diarrhea after meals; consider relation to metformin versus IBS versus diabetic colopathy versus other 4. Chronic anticoagulation  Plan: 1. Scheduled patient for an EGD and colonoscopy in the [facility] with Dr. Hilarie Fredrickson, as he is a supervising physician today. Did discuss risks, benefits, limitations and alternatives and the patient agrees to proceed. 2. Recommend the patient start over-the-counter iron twice daily. 3. Patient was instructed to hold her Eliquis for 2 days prior to her  procedure. We will communicate with her doctor to ensure that holding her [place] is acceptable. 4. We briefly discussed patient's diarrhea, colonoscopy will be done for anemia as above, we will also look for causes of diarrhea. It could be that patient has a side effect from Metformin. She may benefit from referral to an endocrinologist as her diabetes seems to have been out of control over the past year or 2 per her. 5. We discussed elevated liver enzymes, at this time would just recommend a repeat of these in 2-3 months. If they remain elevated would recommend a right upper quadrant ultrasound of the liver as well as some further liver serologies. High suspicion for fatty liver at this time. 6. Patient to follow in clinic per recommendations from Dr. Hilarie Fredrickson after time of procedures.  Ellouise Newer, PA-C [facility] 04/22/2017, 1:42 PM  Cc: Levin Erp, MD   Addendum: Reviewed and agree with initial management. Pyrtle, Lajuan Lines, MD

## 2017-05-20 ENCOUNTER — Encounter: Payer: Self-pay | Admitting: Internal Medicine

## 2017-05-21 ENCOUNTER — Telehealth: Payer: Self-pay

## 2017-05-21 ENCOUNTER — Telehealth: Payer: Self-pay | Admitting: *Deleted

## 2017-05-21 NOTE — Telephone Encounter (Signed)
Discussed with Carla Drape, CMA Eliquis and need for holding for colonoscopy 05/26/17 and if we had instructions for the patient from cardiology. She is following through with Peter Congo, [place].

## 2017-05-21 NOTE — Telephone Encounter (Signed)
  [facility] [address] [place],   [address] Phone:  [phone]   Fax:  [phone]  05/21/2017   RE:      Kayla Hendrix DOB:   Mar 13, 1951 MRN:   [id number]    Dear Dr. Rayann Heman,    We have scheduled the above patient for an endoscopic procedure. Our records show that she is on anticoagulation therapy.   Please advise as to how long the patient may come off her therapy of Eliquis prior to the endo/colon procedure, which is scheduled for 05/26/17.  Please fax back/ or route the completed form to [facility], [place] at [phone].   Sincerely,    Thurmon Fair, RMA

## 2017-05-22 NOTE — Telephone Encounter (Signed)
Per Ellen Henri, PA (office closed) PA on call - for Dr. Rayann Heman states okay for patient to hold Eliquis the day before and the day of ECL procedure.  She wants patient to take morning dose of Eliquis on the 15th and hold evening dose on the 15th.  Please advise. Patients procedure for ECL is on Tuesday, 05/26/17 Thanks Peter Congo, [place]

## 2017-05-22 NOTE — Telephone Encounter (Signed)
Spoke with Dr Hilarie Fredrickson on the phone and advised okay for patient to take Eliquis as instructed by B. Rosita Fire, [place].  Pt advised and confirmed instructions.

## 2017-05-26 ENCOUNTER — Ambulatory Visit (AMBULATORY_SURGERY_CENTER): Payer: 59 | Admitting: Internal Medicine

## 2017-05-26 ENCOUNTER — Encounter: Payer: Self-pay | Admitting: Internal Medicine

## 2017-05-26 VITALS — BP 103/42 | HR 66 | Temp 98.7°F | Resp 12 | Ht 65.0 in | Wt 243.0 lb

## 2017-05-26 DIAGNOSIS — D509 Iron deficiency anemia, unspecified: Secondary | ICD-10-CM

## 2017-05-26 DIAGNOSIS — D125 Benign neoplasm of sigmoid colon: Secondary | ICD-10-CM

## 2017-05-26 DIAGNOSIS — R197 Diarrhea, unspecified: Secondary | ICD-10-CM

## 2017-05-26 DIAGNOSIS — K22719 Barrett's esophagus with dysplasia, unspecified: Secondary | ICD-10-CM | POA: Diagnosis not present

## 2017-05-26 DIAGNOSIS — K635 Polyp of colon: Secondary | ICD-10-CM | POA: Diagnosis not present

## 2017-05-26 DIAGNOSIS — D120 Benign neoplasm of cecum: Secondary | ICD-10-CM

## 2017-05-26 DIAGNOSIS — D122 Benign neoplasm of ascending colon: Secondary | ICD-10-CM

## 2017-05-26 DIAGNOSIS — D123 Benign neoplasm of transverse colon: Secondary | ICD-10-CM

## 2017-05-26 DIAGNOSIS — K2270 Barrett's esophagus without dysplasia: Secondary | ICD-10-CM | POA: Diagnosis not present

## 2017-05-26 DIAGNOSIS — K317 Polyp of stomach and duodenum: Secondary | ICD-10-CM

## 2017-05-26 MED ORDER — SODIUM CHLORIDE 0.9 % IV SOLN: 500.0000 mL | INTRAVENOUS | Status: AC

## 2017-05-26 NOTE — Progress Notes (Signed)
Patient awakening,vss,report to rn 

## 2017-05-26 NOTE — Patient Instructions (Signed)
YOU HAD AN ENDOSCOPIC PROCEDURE TODAY AT [facility]:   Refer to the procedure report that was given to you for any specific questions about what was found during the examination.  If the procedure report does not answer your questions, please call your gastroenterologist to clarify.  If you requested that your care partner not be given the details of your procedure findings, then the procedure report has been included in a sealed envelope for you to review at your convenience later.  YOU SHOULD EXPECT: Some feelings of bloating in the abdomen. Passage of more gas than usual.  Walking can help get rid of the air that was put into your GI tract during the procedure and reduce the bloating. If you had a lower endoscopy (such as a colonoscopy or flexible sigmoidoscopy) you may notice spotting of blood in your stool or on the toilet paper. If you underwent a bowel prep for your procedure, you may not have a normal bowel movement for a few days.  Please Note:  You might notice some irritation and congestion in your nose or some drainage.  This is from the oxygen used during your procedure.  There is no need for concern and it should clear up in a day or so.  SYMPTOMS TO REPORT IMMEDIATELY:   Following lower endoscopy (colonoscopy or flexible sigmoidoscopy):  Excessive amounts of blood in the stool  Significant tenderness or worsening of abdominal pains  Swelling of the abdomen that is new, acute  Fever of 100F or higher   Following upper endoscopy (EGD)  Vomiting of blood or coffee ground material  New chest pain or pain under the shoulder blades  Painful or persistently difficult swallowing  New shortness of breath  Fever of 100F or higher  Black, tarry-looking stools  For urgent or emergent issues, a gastroenterologist can be reached at any hour by calling [phone].   DIET:  We do recommend a small meal at first, but then you may proceed to your regular diet.  Drink  plenty of fluids but you should avoid alcoholic beverages for 24 hours.  ACTIVITY:  You should plan to take it easy for the rest of today and you should NOT DRIVE or use heavy machinery until tomorrow (because of the sedation medicines used during the test).    FOLLOW UP: Our staff will call the number listed on your records the next business day following your procedure to check on you and address any questions or concerns that you may have regarding the information given to you following your procedure. If we do not reach you, we will leave a message.  However, if you are feeling well and you are not experiencing any problems, there is no need to return our call.  We will assume that you have returned to your regular daily activities without incident.  If any biopsies were taken you will be contacted by phone or by letter within the next 1-3 weeks.  Please call us at [phone] if you have not heard about the biopsies in 3 weeks.   Await for biopsy results to determine next repeat screening Colonoscopy Resume Eliquis (apixaban) at prior dose in 3 days. No NSAIDS (aspirin,ibuprofen, aleve, naproxen) Polyps (handout given) Hemorrhoids (handout given) Hiatal Hernia (handout given)  SIGNATURES/CONFIDENTIALITY: You and/or your care partner have signed paperwork which will be entered into your electronic medical record.  These signatures attest to the fact that that the information above on your After Visit Summary  has been reviewed and is understood.  Full responsibility of the confidentiality of this discharge information lies with you and/or your care-partner. 

## 2017-05-26 NOTE — Op Note (Signed)
[facility] Patient Name: Kayla Hendrix Procedure Date: 05/26/2017 2:44 PM MRN: [id number] Endoscopist: Jerene Bears , MD Age: 66 Referring MD:  Date of Birth: 1951-08-02 Gender: Female Account #: [id number] Procedure:                Upper GI endoscopy Indications:              Iron deficiency anemia Medicines:                Monitored Anesthesia Care Procedure:                Pre-Anesthesia Assessment:                           - Prior to the procedure, a History and Physical                            was performed, and patient medications and                            allergies were reviewed. The patient's tolerance of                            previous anesthesia was also reviewed. The risks                            and benefits of the procedure and the sedation                            options and risks were discussed with the patient.                            All questions were answered, and informed consent                            was obtained. Prior Anticoagulants: The patient has                            taken Eliquis (apixaban), last dose was 3 days                            prior to procedure. ASA Grade Assessment: III - A                            patient with severe systemic disease. After                            reviewing the risks and benefits, the patient was                            deemed in satisfactory condition to undergo the                            procedure.  After obtaining informed consent, the endoscope was                            passed under direct vision. Throughout the                            procedure, the patient's blood pressure, pulse, and                            oxygen saturations were monitored continuously. The                            Endoscope was introduced through the mouth, and                            advanced to the second part of duodenum. The upper         GI endoscopy was accomplished without difficulty.                            The patient tolerated the procedure well. Scope In: Scope Out: Findings:                 The esophagus and gastroesophageal junction were                            examined with white light and narrow band imaging                            (NBI) from a forward view and retroflexed position.                            There were esophageal mucosal changes suggestive of                            short-segment Barrett's esophagus. These changes                            involved the mucosa at the upper extent of the                            gastric folds (37 cm from the incisors) extending                            to the Z-line (35 cm from the incisors).                            Circumferential salmon-colored mucosa was present                            from 35 to 36 cm, tongues of salmon-colored mucosa                            were present and scattered islands of  salmon-colored mucosa were present. The maximum                            longitudinal extent of these esophageal mucosal                            changes was 2 cm in length. Mucosa was biopsied                            with a cold forceps for histology. One specimen                            bottle was sent to pathology.                           A 2 cm hiatal hernia was present.                           A single 15 mm pedunculated polyp with no bleeding                            was found on the greater curvature of the gastric                            antrum. The polyp was removed with a hot snare.                            Resection and retrieval were complete. To prevent                            bleeding after the polypectomy, one hemostatic clip                            was successfully placed (MR conditional). There was                            no bleeding at the end of the procedure.                            A single 8 mm pedunculated polyp with no bleeding                            was found at the incisura. The polyp was removed                            with a hot snare. Resection and retrieval were                            complete. To prevent bleeding after the                            polypectomy, one hemostatic clip was successfully  placed (MR conditional). There was no bleeding at                            the end of the procedure.                           Multiple diminutive sessile polyps with no bleeding                            and no stigmata of recent bleeding were found in                            the cardia, in the gastric fundus and in the                            gastric body.                           The examined duodenum was normal. Biopsies for                            histology were taken with a cold forceps for                            evaluation of celiac disease. Complications:            No immediate complications. Estimated Blood Loss:     Estimated blood loss was minimal. Impression:               - Esophageal mucosal changes suggestive of                            short-segment Barrett's esophagus. Biopsied.                           - 2 cm hiatal hernia.                           - A single gastric polyp. Resected and retrieved.                            Clip (MR conditional) was placed.                           - A single gastric polyp. Resected and retrieved.                            Clip (MR conditional) was placed.                           - Multiple gastric polyps.                           - Normal examined duodenum. Biopsied. Recommendation:           - Patient has a contact number available for  emergencies. The signs and symptoms of potential                            delayed complications were discussed with the                            patient.  Return to normal activities tomorrow.                            Written discharge instructions were provided to the                            patient.                           - Resume previous diet.                           - Continue present medications.                           - Resume Eliquis (apixaban) at prior dose in 3                            days. Refer to managing physician for further                            adjustment of therapy.                           - No NSAIDs.                           - Await pathology results.                           - Repeat upper endoscopy for surveillance based on                            pathology results. Jerene Bears, MD 05/26/2017 4:17:07 PM This report has been signed electronically.

## 2017-05-26 NOTE — Progress Notes (Signed)
Called to room to assist during endoscopic procedure.  Patient ID and intended procedure confirmed with present staff. Received instructions for my participation in the procedure from the performing physician.  

## 2017-05-26 NOTE — Op Note (Signed)
[place] Patient Name: Kayla Hendrix Procedure Date: 05/26/2017 2:44 PM MRN: [id number] Endoscopist: Jerene Bears , MD Age: 66 Referring MD:  Date of Birth: [birth year] Gender: Female Account #: [id number] Procedure:                Colonoscopy Indications:              Iron deficiency anemia, chronic diarrhea Medicines:                Monitored Anesthesia Care Procedure:                Pre-Anesthesia Assessment:                           - Prior to the procedure, a History and Physical                            was performed, and patient medications and                            allergies were reviewed. The patient's tolerance of                            previous anesthesia was also reviewed. The risks                            and benefits of the procedure and the sedation                            options and risks were discussed with the patient.                            All questions were answered, and informed consent                            was obtained. Prior Anticoagulants: The patient has                            taken Eliquis (apixaban), last dose was 3 days                            prior to procedure. ASA Grade Assessment: III - A                            patient with severe systemic disease. After                            reviewing the risks and benefits, the patient was                            deemed in satisfactory condition to undergo the                            procedure.  After obtaining informed consent, the colonoscope                            was passed under direct vision. Throughout the                            procedure, the patient's blood pressure, pulse, and                            oxygen saturations were monitored continuously. The                            Colonoscope was introduced through the anus and                            advanced to the the cecum, identified by            appendiceal orifice and ileocecal valve. The                            colonoscopy was technically difficult and complex                            due to significant looping. The patient tolerated                            the procedure well. The quality of the bowel                            preparation was good. The ileocecal valve,                            appendiceal orifice, and rectum were photographed. Scope In: 3:39:39 PM Scope Out: 4:07:56 PM Scope Withdrawal Time: 0 hours 15 minutes 23 seconds  Total Procedure Duration: 0 hours 28 minutes 17 seconds  Findings:                 The digital rectal exam was normal.                           A 5 mm polyp was found in the cecum. The polyp was                            sessile. The polyp was removed with a cold snare.                            Resection and retrieval were complete.                           Three sessile polyps were found in the transverse                            colon, hepatic flexure and ascending colon. The  polyps were 3 to 5 mm in size. These polyps were                            removed with a cold snare. Resection and retrieval                            were complete.                           A 4 mm polyp was found in the sigmoid colon. The                            polyp was sessile. The polyp was removed with a                            cold snare. Resection and retrieval were complete.                           The exam was otherwise normal throughout the                            examined colon.                           Biopsies for histology were taken with a cold                            forceps from the right colon and left colon for                            evaluation of microscopic colitis.                           Internal hemorrhoids were found during                            retroflexion. The hemorrhoids were small. Complications:             No immediate complications. Estimated Blood Loss:     Estimated blood loss was minimal. Impression:               - One 5 mm polyp in the cecum, removed with a cold                            snare. Resected and retrieved.                           - Three 3 to 5 mm polyps in the transverse colon,                            at the hepatic flexure and in the ascending colon,                            removed with a cold snare. Resected  and retrieved.                           - One 4 mm polyp in the sigmoid colon, removed with                            a cold snare. Resected and retrieved.                           - Internal hemorrhoids.                           - Biopsies were taken with a cold forceps from the                            right colon and left colon for evaluation of                            microscopic colitis. Recommendation:           - Patient has a contact number available for                            emergencies. The signs and symptoms of potential                            delayed complications were discussed with the                            patient. Return to normal activities tomorrow.                            Written discharge instructions were provided to the                            patient.                           - Resume previous diet.                           - Continue present medications.                           - Await pathology results.                           - Repeat colonoscopy is recommended. The                            colonoscopy date will be determined after pathology                            results from today's exam become available for                            review.                           -  Office follow-up next available.                           - Resume Eliquis in 3 days per EGD report                            recommendations.                           - Will refer pathology and then discuss iron                             replacement. Jerene Bears, MD 05/26/2017 4:21:02 PM This report has been signed electronically.

## 2017-05-27 ENCOUNTER — Telehealth: Payer: Self-pay | Admitting: *Deleted

## 2017-05-27 NOTE — Telephone Encounter (Signed)
  Follow up Call-  Call back number 05/26/2017  Post procedure Call Back phone  # [phone]  Permission to leave phone message Yes  Some recent data might be hidden     Patient questions:   Unable to call out.  Will try again later.

## 2017-05-27 NOTE — Telephone Encounter (Signed)
  Follow up Call-  Call back number 05/26/2017  Post procedure Call Back phone  # [phone]  Permission to leave phone message Yes  Some recent data might be hidden    [facility]

## 2017-06-01 ENCOUNTER — Encounter: Payer: Self-pay | Admitting: Internal Medicine

## 2017-07-09 ENCOUNTER — Other Ambulatory Visit: Payer: Self-pay | Admitting: Physician Assistant

## 2017-07-09 DIAGNOSIS — I480 Paroxysmal atrial fibrillation: Secondary | ICD-10-CM

## 2017-07-10 ENCOUNTER — Telehealth: Payer: Self-pay | Admitting: Internal Medicine

## 2017-07-10 DIAGNOSIS — I480 Paroxysmal atrial fibrillation: Secondary | ICD-10-CM

## 2017-07-10 NOTE — Telephone Encounter (Signed)
Pt last saw Dr Rayann Heman on 03/07/16, pt was supposed to see Roderic Palau, NP in [place] clinic in 3 months.  Pt has not seen cardiologist is over 1 year. Attempted to contact pt home #, phone picked up but no answer on the other end of the line.  Called cell, [facility] for appt overdue for follow-up.   Age 66, weight 110.2kg, last labs 04/08/17 Creat 0.90, based on specified criteria pt is on appropriate dosage of Eliquis 5mg  BID.  Will refill rx once pt calls back and makes follow-up appt with MD.

## 2017-07-10 NOTE — Telephone Encounter (Signed)
Pt last saw Dr Rayann Heman on 03/07/16, pt was supposed to see Roderic Palau, NP in [place] [facility] in 3 months.  Pt has not seen cardiologist is over 1 year. Attempted to contact pt home #, phone picked up but no answer on the other end of the line.  Called cell, [facility] for appt overdue for follow-up.   Age 66, weight 110.2kg, last labs 04/08/17 Creat 0.90, based on specified criteria pt is on appropriate dosage of Eliquis 5mg  BID.  Will refill rx once pt calls back and makes follow-up appt with MD.

## 2017-07-10 NOTE — Telephone Encounter (Signed)
New message        *STAT* If patient is at the pharmacy, call can be transferred to refill team.   1. Which medications need to be refilled? (please list name of each medication and dose if known)  eliquis 5mg  2. Which pharmacy/location (including street and city if local pharmacy) is medication to be sent to?  [facility] [place] [facility]. Do they need a 30 day or 90 day supply? 30 day

## 2017-07-10 NOTE — Telephone Encounter (Signed)
Please review for refill. Thanks!  

## 2017-07-15 NOTE — Telephone Encounter (Signed)
Called and spoke with pt and transferred her to scheduling to set up appt to be seen by Dr Rayann Heman or one of PA so can reorder her Eliquis .She states she in not out of Eliquis

## 2017-07-16 MED ORDER — APIXABAN 5 MG PO TABS: 5.0000 mg | ORAL_TABLET | Freq: Two times a day (BID) | ORAL | 5 refills | Status: DC

## 2017-07-16 NOTE — Telephone Encounter (Signed)
Age 66 years Wt 110.5kg  04/22/2017 Has an appt to see Tyson Babinski on Sept 19th SrCr 0.9   04/08/2017 01/02/2016 Hgb 12.9 HCT 37.6 Refill done on Eliquis 5mg  q 12 hours as requested

## 2017-07-23 ENCOUNTER — Encounter: Payer: Self-pay | Admitting: *Deleted

## 2017-07-26 NOTE — Progress Notes (Signed)
Cardiology Office Note Date:  07/29/2017  Patient ID:  Kayla, Hendrix May 01, 1951, MRN [id number] PCP:  Levin Erp, MD  Electrophysiologist:  Dr. Rayann Heman    Chief Complaint: needs Eliquis refills  History of Present Illness: Kayla Hendrix is a 66 y.o. female with history of OSA intolerant of CPAP, HTN, HLD, DM, persistent AFib.   She comes in today to be seen for Dr. Rayann Heman, last seen by him in April 2017 she was s/p DCCV maintaining SR, discussed life style modifications and AF triggers.  She was not taking the Eliquis 2/2 cost and given copay card, mentioning she had been previously intolerant of xarelto.  She denies any kind of CP, she does have a chronic component of DOE, though worries she may have heart failure after looking up the symptom.  She has no rest SOB, denies symptoms of PND or orthopnea.  She is always tired, never feels like she gets enough sleep know to have OSA though unable to tolerate CPAP, and is pending a consult with Dr. Ron Parker to see about an oral device.  She says she can sit for a few minutes and drift to sleep.  NO dizziness, near syncope or syncope.  She denies palpitations, says her AF symptoms was more of an unusual physical fatigue and has not had that.  She denies any bleeding or signs of bleeding.  She had labs in may by her PMD noted her blood counts were down some and had EGD/colonoscopy without evidence of bleeding reported to her, f/u labs she reports showed her counts better, and was never recommended to stop her Eliquis.  She reports she has been having trouble with BS control, her PMD is following her closely.  Past Medical History:  Diagnosis Date  . Allergy   . Anemia   . Anxiety   . Atrial fibrillation (HCC)    paroxysmal only asa 81 mg, no other blood thinner, the xarelto she was on gave her HAs  . CAD (coronary artery disease)    cath 2006- nonobstructive CAD  . Cataract   . Depression   . Diabetes mellitus without complication ([place])    . GERD (gastroesophageal reflux disease)   . Glucose intolerance (impaired glucose tolerance)   . Hiatal hernia   . History of transesophageal echocardiography (TEE) for monitoring    TEE 3/17:  EF 55%, no RWMA, mild plaque in descending aorta, mild MR, mod BAE, normal RVF  . HL (hearing loss)   . HTN (hypertension)   . Hyperlipidemia   . Hyperplastic colon polyp   . Internal hemorrhoids   . Obesity   . OSA (obstructive sleep apnea)    noncompliant with CPAP  . Tubular adenoma of colon     Past Surgical History:  Procedure Laterality Date  . CARDIOVERSION N/A 01/23/2016   Procedure: CARDIOVERSION;  Surgeon: Larey Dresser, MD;  Location: [facility];  Service: Cardiovascular;  Laterality: N/A;  . CESAREAN SECTION    . GANGLION CYST EXCISION Left 1970s  . TEE WITHOUT CARDIOVERSION N/A 01/23/2016   Procedure: TRANSESOPHAGEAL ECHOCARDIOGRAM (TEE);  Surgeon: Larey Dresser, MD;  Location: [facility];  Service: Cardiovascular;  Laterality: N/A;  . TOTAL ABDOMINAL HYSTERECTOMY    . WRIST FRACTURE SURGERY  2010   wrist fracture repair with metal rod    Current Outpatient Prescriptions  Medication Sig Dispense Refill  . apixaban (ELIQUIS) 5 MG TABS tablet Take 1 tablet (5 mg total) by mouth 2 (two) times daily.  60 tablet 5  . clonazePAM (KLONOPIN) 0.5 MG tablet Take 0.5 mg by mouth 3 (three) times daily as needed for anxiety.     Marland Kitchen diltiazem (TIAZAC) 360 MG 24 hr capsule Take 360 mg by mouth daily.    Marland Kitchen FLUoxetine (PROZAC) 20 MG capsule Take 20 mg by mouth daily.      Marland Kitchen glimepiride (AMARYL) 4 MG tablet Take 4 mg by mouth 2 (two) times daily.  99  . hydrochlorothiazide (HYDRODIURIL) 25 MG tablet Take 25 mg by mouth daily.    Marland Kitchen lisinopril (PRINIVIL,ZESTRIL) 20 MG tablet Take 20 mg by mouth daily.    . metFORMIN (GLUCOPHAGE) 500 MG tablet Take 1,000 mg by mouth 2 (two) times daily with a meal.     . metoprolol (LOPRESSOR) 50 MG tablet TAKE 1 TABLET (50 MG TOTAL) BY MOUTH 2 (TWO)  TIMES DAILY. 60 tablet 7  . omeprazole (PRILOSEC) 20 MG capsule Take 20 mg by mouth 2 (two) times daily.      . sitaGLIPtin (JANUVIA) 100 MG tablet Take 100 mg by mouth daily.     Current Facility-Administered Medications  Medication Dose Route Frequency Provider Last Rate Last Dose  . 0.9 %  sodium chloride infusion  500 mL Intravenous Continuous Pyrtle, Lajuan Lines, MD        Allergies:   Hydrocodone   Social History:  The patient  reports that she quit smoking about 8 years ago. She has never used smokeless tobacco. She reports that she does not drink alcohol or use drugs.   Family History:  The patient's family history includes Cancer in her father; Diabetes in her unknown relative; Hypertension in her unknown relative; Stomach cancer in her father and maternal grandmother.  ROS:  Please see the history of present illness.    All other systems are reviewed and otherwise negative.   PHYSICAL EXAM:  VS:  BP (!) 138/58   Pulse 67   Ht 5\' 5"  (1.651 m)   Wt 242 lb (109.8 kg)   BMI 40.27 kg/m  BMI: Body mass index is 40.27 kg/m. Well nourished, well developed, in no acute distress  HEENT: normocephalic, atraumatic  Neck: no JVD, carotid bruits or masses Cardiac:  RRR; soft SM, no rubs, or gallops Lungs:  CTA b/l, no wheezing, rhonchi or rales  Abd: soft, nontender, obese MS: no deformity or atrophy Ext: no edema  Skin: warm and dry, no rash Neuro:  No gross deficits appreciated Psych: euthymic mood, full affect   EKG:  Done today and reviewed by myself shows SR, PVC, ICRBBB  07/13/09: TTE Study Conclusions 1. Left atrium: No evidence of thrombus in the atrial cavity or  appendage. 2. Atrial septum: No defect or patent foramen ovale was identified. Transesophageal echocardiography. 2D and color Doppler.  Normal LVEF   Recent Labs: No results found for requested labs within last 8760 hours.  No results found for requested labs within last 8760 hours.   CrCl cannot be  calculated (Patient's most recent lab result is older than the maximum 21 days allowed.).   Wt Readings from Last 3 Encounters:  07/29/17 242 lb (109.8 kg)  05/26/17 243 lb (110.2 kg)  04/22/17 243 lb 9.6 oz (110.5 kg)     Other studies reviewed: Additional studies/records reviewed today include: summarized above  ASSESSMENT AND PLAN:  1. Persistent AFib     CHA2DS2Vasc is at least 4, on Eliquis  2. HTN     No changes  3. Untreated sleep  apnea     Counseled she is in the process of further evaluatin to treatment options  4. DOE     Sounds chronic, not escalating     Likely is multifactorial, though the patient is concerned about potential cardiac etiology     Will schedule for echo  5. Obesity     discussed exercise strategies given chronic back issues, diet   Disposition: F/u in 6 months, sooner if needed, pending her echo   Current medicines are reviewed at length with the patient today.  The patient did not have any concerns regarding medicines.  Kayla Night, PA-C 07/29/2017 3:42 PM     [address] [place] [address] [phone] (office)  [phone] (fax)

## 2017-07-29 ENCOUNTER — Ambulatory Visit (INDEPENDENT_AMBULATORY_CARE_PROVIDER_SITE_OTHER): Payer: 59 | Admitting: Physician Assistant

## 2017-07-29 VITALS — BP 138/58 | HR 67 | Ht 65.0 in | Wt 242.0 lb

## 2017-07-29 DIAGNOSIS — I4819 Other persistent atrial fibrillation: Secondary | ICD-10-CM

## 2017-07-29 DIAGNOSIS — I10 Essential (primary) hypertension: Secondary | ICD-10-CM

## 2017-07-29 DIAGNOSIS — R0602 Shortness of breath: Secondary | ICD-10-CM

## 2017-07-29 DIAGNOSIS — I481 Persistent atrial fibrillation: Secondary | ICD-10-CM

## 2017-07-29 NOTE — Patient Instructions (Addendum)
Medication Instructions:   Your physician recommends that you continue on your current medications as directed. Please refer to the Current Medication list given to you today.    If you need a refill on your cardiac medications before your next appointment, please call your pharmacy.  Labwork: NONE ORDERED  TODAY    Testing/Procedures: Your physician has requested that you have an echocardiogram. Echocardiography is a painless test that uses sound waves to create images of your heart. It provides your doctor with information about the size and shape of your heart and how well your heart's chambers and valves are working. This procedure takes approximately one hour. There are no restrictions for this procedure.      Follow-Up:  Your physician wants you to follow-up in:  IN 6  MONTHS WITH URSUY You will receive a reminder letter in the mail two months in advance. If you don't receive a letter, please call our office to schedule the follow-up appointment.     Any Other Special Instructions Will Be Listed Below (If Applicable).                                                                                                                                                   

## 2017-07-30 ENCOUNTER — Telehealth: Payer: Self-pay

## 2017-07-30 NOTE — Telephone Encounter (Signed)
-----   Message from Lowell Guitar, [place] sent at 04/28/2017  3:32 PM EDT ----- Check on repeat labs 9/18

## 2017-07-30 NOTE — Telephone Encounter (Signed)
Called patient to see if she had LFTs done.  Pt states she did not know she was supposed to have repeat labs.  She states she has an appt with Dr Hilarie Fredrickson on 08/04/17.  I told her we could draw them at that office visit since she had not had them done.

## 2017-08-05 ENCOUNTER — Other Ambulatory Visit (INDEPENDENT_AMBULATORY_CARE_PROVIDER_SITE_OTHER): Payer: 59

## 2017-08-05 ENCOUNTER — Encounter: Payer: Self-pay | Admitting: Internal Medicine

## 2017-08-05 ENCOUNTER — Other Ambulatory Visit: Payer: Self-pay

## 2017-08-05 ENCOUNTER — Ambulatory Visit (INDEPENDENT_AMBULATORY_CARE_PROVIDER_SITE_OTHER): Payer: 59 | Admitting: Internal Medicine

## 2017-08-05 VITALS — BP 126/68 | HR 66 | Ht 66.0 in | Wt 239.0 lb

## 2017-08-05 DIAGNOSIS — D509 Iron deficiency anemia, unspecified: Secondary | ICD-10-CM | POA: Diagnosis not present

## 2017-08-05 DIAGNOSIS — Z86010 Personal history of colonic polyps: Secondary | ICD-10-CM | POA: Diagnosis not present

## 2017-08-05 DIAGNOSIS — K317 Polyp of stomach and duodenum: Secondary | ICD-10-CM | POA: Diagnosis not present

## 2017-08-05 DIAGNOSIS — K2270 Barrett's esophagus without dysplasia: Secondary | ICD-10-CM | POA: Diagnosis not present

## 2017-08-05 DIAGNOSIS — K5900 Constipation, unspecified: Secondary | ICD-10-CM

## 2017-08-05 LAB — CBC WITH DIFFERENTIAL/PLATELET
BASOS PCT: 0.9 % (ref 0.0–3.0)
Basophils Absolute: 0.1 10*3/uL (ref 0.0–0.1)
EOS ABS: 0.7 10*3/uL (ref 0.0–0.7)
Eosinophils Relative: 8 % — ABNORMAL HIGH (ref 0.0–5.0)
HEMATOCRIT: 36.2 % (ref 36.0–46.0)
Hemoglobin: 11.8 g/dL — ABNORMAL LOW (ref 12.0–15.0)
Lymphocytes Relative: 24.7 % (ref 12.0–46.0)
Lymphs Abs: 2.1 10*3/uL (ref 0.7–4.0)
MCHC: 32.6 g/dL (ref 30.0–36.0)
MCV: 75.7 fl — ABNORMAL LOW (ref 78.0–100.0)
MONO ABS: 0.6 10*3/uL (ref 0.1–1.0)
Monocytes Relative: 7.1 % (ref 3.0–12.0)
NEUTROS ABS: 4.9 10*3/uL (ref 1.4–7.7)
Neutrophils Relative %: 59.3 % (ref 43.0–77.0)
PLATELETS: 327 10*3/uL (ref 150.0–400.0)
RBC: 4.78 Mil/uL (ref 3.87–5.11)
RDW: 15.7 % — AB (ref 11.5–15.5)
WBC: 8.3 10*3/uL (ref 4.0–10.5)

## 2017-08-05 LAB — IBC PANEL
Iron: 23 ug/dL — ABNORMAL LOW (ref 42–145)
Saturation Ratios: 4.4 % — ABNORMAL LOW (ref 20.0–50.0)
TRANSFERRIN: 371 mg/dL — AB (ref 212.0–360.0)

## 2017-08-05 LAB — FERRITIN: Ferritin: 17 ng/mL (ref 10.0–291.0)

## 2017-08-05 MED ORDER — INTEGRA F 125-1 MG PO CAPS
125.0000 mg | ORAL_CAPSULE | Freq: Every day | ORAL | 3 refills | Status: DC
Start: 2017-08-05 — End: 2018-10-11

## 2017-08-05 NOTE — Progress Notes (Signed)
   Subjective:    Patient ID: Kayla Hendrix, female    DOB: 1950-11-17, 67 y.o.   MRN: [id number]  HPI [name] is a 66 year old female with history of Barrett's esophagus, gastric polyps, colon polyps, iron deficiency anemia, atrial fibrillation on Eliquis, CAD, diabetes, hypertension and sleep apnea who returns for follow-up after upper endoscopy and colonoscopy performed to evaluate IDA. She is here alone today.  EGD and colonoscopy were performed on 05/26/2017: EGD -- short segment Barrett's esophagus without dysplasia, 2 cm hiatal hernia, 15 mm pedunculated polyp on the greater curve removed by hot snare, 8 mm pedunculated polyp removed from the incisura. Diminutive polyps in the proximal stomach and normal examined duodenum. Pathology results showed hyperplastic polyps with focal intestinal metaplasia. Negative for H. Pylori. No dysplasia. Small bowel biopsies were normal. Esophageal biopsies consistent with Barrett's esophagus without dysplasia. Colonoscopy -- 5 polyps removed ranging in size from 3-5 mm, internal hemorrhoids and random biopsies. Polyps are found to be tubular adenomas with one hyperplastic polyp. There was no evidence of microscopic colitis.  She reports that she has been feeling well. She denies heartburn and dysphagia. She's been on omeprazole 40 mg daily without issue. Prior to this medication she was having frequent nausea and vomiting. She denies abdominal pain. Stools can be at times hard but she denies feeling constipated. No further diarrhea or loose stools. No blood in her stool or melena.  She has remained on Eliquis, diltiazem, glimepiride, hydrochlorothiazide, lisinopril, metformin, metoprolol and Januvia.  Review of Systems As per history of present illness, otherwise negative  Current Medications, Allergies, Past Medical History, Past Surgical History, Family History and Social History were reviewed in [employer] record.        Objective:   Physical Exam BP 126/68   Pulse 66   Ht 5\' 6"  (1.676 m)   Wt 239 lb (108.4 kg)   BMI 38.58 kg/m  Constitutional: Well-developed and well-nourished. No distress. HEENT: Normocephalic and atraumatic.  Conjunctivae are normal.  No scleral icterus. Neck: Neck supple. Trachea midline. Cardiovascular: Normal rate, regular rhythm and intact distal pulses.  Pulmonary/chest: Effort normal and breath sounds normal. No wheezing, rales or rhonchi. Abdominal: Soft, nontender, nondistended. Bowel sounds active throughout. Extremities: no clubbing, cyanosis, or edema Neurological: Alert and oriented to person place and time. Skin: Skin is warm and dry. Psychiatric: Normal mood and affect. Behavior is normal.     Assessment & Plan:  66 year old female with history of Barrett's esophagus, gastric polyps, colon polyps, iron deficiency anemia, atrial fibrillation on Eliquis, CAD, diabetes, hypertension and sleep apnea who returns for follow-up after upper endoscopy and colonoscopy performed to evaluate IDA.   1. IDA -- Very likely related to intermittent oozing from large gastric polyps in the setting of Eliquis. These have been removed. See #2.Will repeat iron studies and blood counts today. May need oral iron supplementation. If after adequate iron supplementation iron deficiency recurs consider video capsule endoscopy.  2. Gastric polyps -- hyperplastic with focal intestinal metaplasia. Due to the size of these polyps I have recommended surveillance upper endoscopy in one year. She is aware of this recommendation  3. GERD with Barrett's esophagus -- without dysplasia. Symptoms well controlled with omeprazole. Continue omeprazole 40 mg daily. Repeat EGD in 3 years  4.Mild constipation -- can use Colace 100-200 mg at bedtime as needed for hard stool.  5. Adenomatous colon polyps -- repeat colonoscopy in 3 years

## 2017-08-05 NOTE — Patient Instructions (Addendum)
Continue omeprazole daily.  Please purchase the following medications over the counter and take as directed: Colace 100-200 mg daily to soften stool  You will be due for a recall endoscopy in 05/2018. We will send you a reminder in the mail when it gets closer to that time.  Your physician has requested that you go to the basement for the following lab work before leaving today: CBC, IBC, ferritin  If you are age 65 or older, your body mass index should be between 23-30. Your Body mass index is 38.58 kg/m. If this is out of the aforementioned range listed, please consider follow up with your Primary Care Provider.  If you are age 64 or younger, your body mass index should be between 19-25. Your Body mass index is 38.58 kg/m. If this is out of the aformentioned range listed, please consider follow up with your Primary Care Provider.

## 2017-08-10 ENCOUNTER — Other Ambulatory Visit: Payer: Self-pay

## 2017-08-10 ENCOUNTER — Ambulatory Visit (HOSPITAL_COMMUNITY): Payer: 59 | Attending: Internal Medicine

## 2017-08-10 DIAGNOSIS — R0602 Shortness of breath: Secondary | ICD-10-CM

## 2017-08-10 DIAGNOSIS — I517 Cardiomegaly: Secondary | ICD-10-CM | POA: Diagnosis not present

## 2017-08-14 ENCOUNTER — Telehealth: Payer: Self-pay | Admitting: Internal Medicine

## 2017-08-14 NOTE — Telephone Encounter (Signed)
°  Follow Up  Returning call from earlier regarding echocardiogram results. Please call.

## 2017-08-17 ENCOUNTER — Telehealth: Payer: Self-pay | Admitting: *Deleted

## 2017-08-17 NOTE — Telephone Encounter (Signed)
VM IS FULL COULDN'T LEAVE A MESSAGE

## 2017-10-23 ENCOUNTER — Other Ambulatory Visit: Payer: Self-pay | Admitting: Internal Medicine

## 2017-12-23 ENCOUNTER — Telehealth: Payer: Self-pay | Admitting: Internal Medicine

## 2017-12-23 DIAGNOSIS — R531 Weakness: Secondary | ICD-10-CM

## 2017-12-23 DIAGNOSIS — Z7901 Long term (current) use of anticoagulants: Secondary | ICD-10-CM

## 2017-12-23 MED ORDER — METOPROLOL TARTRATE 25 MG PO TABS: 25.0000 mg | ORAL_TABLET | Freq: Two times a day (BID) | ORAL | 3 refills | Status: DC

## 2017-12-23 NOTE — Telephone Encounter (Signed)
Patient states she has been having low BP since approximately 1130 am today. She states she felt weak at work and left to go home. Once she got home she took her BP which was 103/46 mmHg and HR 48 bpm. States she thinks she is in a fib today and has noticed recently that she frequently feels weak. I advised that I will review her symptoms with Dr. Rayann Heman who is in the office and that he may want to reduce some of her medications. I advised I will call her back with his advice. She verbalized understanding and agreement.

## 2017-12-23 NOTE — Telephone Encounter (Signed)
Pt calling d/t weakness, low BP 103/46 taken at 315pm, some SOB for 3-4 hours, thinks she may be in Afib-pls call  [phone]

## 2017-12-23 NOTE — Telephone Encounter (Signed)
Reviewed patient's complaints with Tommye Standard, PA who was the last provider to see the patient. She advised that patient hold Metoprolol tonight and then resume tomorrow at reduced dose of 25 mg BID. She advised that I ask patient about bleeding complaints they discussed at ov in September and if patient has had recent CBC. She states she will see the patient in the office soon for follow-up.  I called patient and reviewed Renee's advice with her. She states she had a colonoscopy and endoscopy and polyps were removed but no bleeding issues were identified. She asks if the a fib will worsen with the reduced dose of metoprolol and I advised her to continue to monitor. I advised that she should continue the diltiazem 360 mg and her other medications and call back if BP and/or HR do not improve or if she has more frequent episodes of a fib. I scheduled her for lab appointment for CBC on 2/18 and for an appointment with [facility] on 3/1. She is aware to call back if symptoms do not improve prior to office visit. She verbalized understanding and agreement with plan and thanked me for the call.

## 2017-12-24 ENCOUNTER — Telehealth (HOSPITAL_COMMUNITY): Payer: Self-pay | Admitting: *Deleted

## 2017-12-24 NOTE — Telephone Encounter (Signed)
Attempted to call pt - rang to voicemail and unable to leave message due to full mailbox.

## 2017-12-24 NOTE — Telephone Encounter (Signed)
Pt cld back, was offered appt to afib clinic.  Pt declined, stating that she felt better today with a blood pressure around 120ish and will call back if any change, but keep appt with Tommye Standard, PA on 01/08/2018

## 2017-12-24 NOTE — Telephone Encounter (Signed)
-----   Message from [place], [place] sent at 12/23/2017  6:54 PM EST ----- Hello!!  This patient called today, felt weak, reported BP lowish and HR 48, she though she was in AF as well.  I told the RN who triaged to have her hold her lopressor tonight with low BP and HR and cut dose in 1/2 and have her come in, but nothing with [place] till march.  On the way home I thought maybe you guys could see her tomorrow (Thursday if she can come in) or Friday and see?  Maybe she needs dccv if able.  THANKS so much!!!!  Joseph Art

## 2017-12-28 ENCOUNTER — Other Ambulatory Visit: Payer: 59

## 2017-12-31 ENCOUNTER — Encounter: Payer: Self-pay | Admitting: Physician Assistant

## 2018-01-08 ENCOUNTER — Ambulatory Visit: Payer: 59 | Admitting: Physician Assistant

## 2018-01-08 VITALS — BP 132/66 | HR 68 | Ht 66.0 in | Wt 241.0 lb

## 2018-01-08 DIAGNOSIS — I4819 Other persistent atrial fibrillation: Secondary | ICD-10-CM

## 2018-01-08 DIAGNOSIS — I481 Persistent atrial fibrillation: Secondary | ICD-10-CM

## 2018-01-08 DIAGNOSIS — I10 Essential (primary) hypertension: Secondary | ICD-10-CM

## 2018-01-08 DIAGNOSIS — Z79899 Other long term (current) drug therapy: Secondary | ICD-10-CM

## 2018-01-08 NOTE — Progress Notes (Signed)
Cardiology Office Note Date:  01/08/2018  Patient ID:  Alazay, Leicht 1951-07-01, MRN [id number] PCP:  Levin Erp, MD  Electrophysiologist:  Dr. Rayann Heman    Chief Complaint: needs Eliquis refills  History of Present Illness: ANGLA DELAHUNT is a 67 y.o. female with history of OSA intolerant of CPAP, HTN, HLD, DM, persistent AFib.   She comes in today to be seen for Dr. Rayann Heman, last seen by him in April 2017 she was s/p DCCV maintaining SR, discussed life style modifications and AF triggers.  She was not taking the Eliquis 2/2 cost and given copay card, mentioning she had been previously intolerant of xarelto.  I saw her in Sepy 2018, she denied any kind of CP, she did have a chronic component of DOE,  She worried may have heart failure after looking up the symptom.  She had no rest SOB, denies symptoms of PND or orthopnea.  She c/o always being tired, never felt like she got enough sleep known to have OSA though unable to tolerate CPAP, and was pending a consult with Dr. Ron Parker to see about an oral device.  She admitted she can sit for a few minutes and drift to sleep.  No dizziness, near syncope or syncope.  She denied palpitations, reported her AF symptoms is more of an unusual physical fatigue and had not had that.  She denied any bleeding or signs of bleeding.  She had labs in may by her PMD noted her blood counts were down some and had EGD/colonoscopy without evidence of bleeding reported to her, f/u labs she reports showed her counts better, and was never recommended to stop her Eliquis.  She reported she has been having trouble with BS control, her PMD is following her closely.  Planned for updated echo, noted normal LVEF, no DD or VHD.  She comes today with concerns of a day 2 weeks ago that she felt unusually tired, thought she might be out of rhythm, found her BP unusually low 10'G systolic.  She went home from work rested and by the evening felt better.  She called the office and  was recommended to take 1/2 tab of her lopressor BID and has since then.  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   That day this seemed to be bothering her more then usual.  None of her symptoms are on ging or have been recurrent.  She mentions she often works 7d/week and this keeps her always tired.  She has not been able to pursue the oral device for her sleep apnea.  No CP, palpitations, no SOB.  She remains at her same exertional capacity.  No exercise.  She denies any bleeding or signs of bleeding, reports compliance with her Eliquis  Past Medical History:  Diagnosis Date  . Allergy   . Anemia   . Anxiety   . Atrial fibrillation (HCC)    paroxysmal only asa 81 mg, no other blood thinner, the xarelto she was on gave her HAs  . CAD (coronary artery disease)    cath 2006- nonobstructive CAD  . Cataract   . Depression   . Diabetes mellitus without complication ([place])   . GERD (gastroesophageal reflux disease)   . Glucose intolerance (impaired glucose tolerance)   . Hiatal hernia   . History of transesophageal echocardiography (TEE) for monitoring    TEE 3/17:  EF 55%, no RWMA, mild plaque  in descending aorta, mild MR, mod BAE, normal RVF  . HL (hearing loss)   . HTN (hypertension)   . Hyperlipidemia   . Hyperplastic colon polyp   . Internal hemorrhoids   . Obesity   . OSA (obstructive sleep apnea)    noncompliant with CPAP  . Tubular adenoma of colon     Past Surgical History:  Procedure Laterality Date  . CARDIOVERSION N/A 01/23/2016   Procedure: CARDIOVERSION;  Surgeon: Larey Dresser, MD;  Location: [facility];  Service: Cardiovascular;  Laterality: N/A;  . CESAREAN SECTION    . GANGLION CYST EXCISION Left 1970s  . TEE WITHOUT CARDIOVERSION N/A 01/23/2016   Procedure: TRANSESOPHAGEAL ECHOCARDIOGRAM (TEE);  Surgeon: Larey Dresser, MD;  Location: [facility];  Service: Cardiovascular;  Laterality: N/A;  .  TOTAL ABDOMINAL HYSTERECTOMY    . WRIST FRACTURE SURGERY  2010   wrist fracture repair with metal rod    Current Outpatient Medications  Medication Sig Dispense Refill  . apixaban (ELIQUIS) 5 MG TABS tablet Take 1 tablet (5 mg total) by mouth 2 (two) times daily. 60 tablet 5  . clonazePAM (KLONOPIN) 0.5 MG tablet Take 0.5 mg by mouth daily.     Marland Kitchen diltiazem (TIAZAC) 360 MG 24 hr capsule Take 360 mg by mouth daily.    . Fe Fum-FePoly-FA-Vit C-Vit B3 (INTEGRA F) 125-1 MG CAPS Take 125 mg by mouth daily. 30 capsule 3  . glimepiride (AMARYL) 4 MG tablet Take 4 mg by mouth 2 (two) times daily.  99  . hydrochlorothiazide (HYDRODIURIL) 25 MG tablet Take 25 mg by mouth daily.    Marland Kitchen lisinopril (PRINIVIL,ZESTRIL) 20 MG tablet Take 20 mg by mouth daily.    . metFORMIN (GLUCOPHAGE) 500 MG tablet Take 1,000 mg by mouth 2 (two) times daily with a meal.     . metoprolol tartrate (LOPRESSOR) 25 MG tablet Take 1 tablet (25 mg total) by mouth 2 (two) times daily. 180 tablet 3  . omeprazole (PRILOSEC) 20 MG capsule Take 20 mg by mouth 2 (two) times daily.      . sitaGLIPtin (JANUVIA) 100 MG tablet Take 100 mg by mouth daily.     Current Facility-Administered Medications  Medication Dose Route Frequency Provider Last Rate Last Dose  . 0.9 %  sodium chloride infusion  500 mL Intravenous Continuous Pyrtle, Lajuan Lines, MD        Allergies:   Hydrocodone   Social History:  The patient  reports that she quit smoking about 9 years ago. she has never used smokeless tobacco. She reports that she does not drink alcohol or use drugs.   Family History:  The patient's family history includes Cancer in her father; Diabetes in her unknown relative; Hypertension in her unknown relative; Stomach cancer in her father and maternal grandmother.  ROS:  Please see the history of present illness.    All other systems are reviewed and otherwise negative.   PHYSICAL EXAM:  VS:  There were no vitals taken for this visit. BMI: There  is no height or weight on file to calculate BMI. Well nourished, well developed, in no acute distress  HEENT: normocephalic, atraumatic  Neck: no JVD, carotid bruits or masses Cardiac:  RRR; soft SM, no rubs, or gallops Lungs:  CTA b/l, no wheezing, rhonchi or rales  Abd: soft, nontender, obese MS: no deformity or atrophy Ext: no edema  Skin: warm and dry, no rash Neuro:  No gross deficits appreciated Psych: euthymic mood, full affect  EKG:  Done today and reviewed by myself shows SR, 68bpm, ICRBBB, appears unchanged from previous  08/10/17: TTE Study Conclusions - Left ventricle: The cavity size was normal. Wall thickness was   increased in a pattern of mild LVH. Systolic function was   vigorous. The estimated ejection fraction was in the range of 65%   to 70%. Left ventricular diastolic function parameters were   normal.  07/13/09: TTE Study Conclusions 1. Left atrium: No evidence of thrombus in the atrial cavity or  appendage. 2. Atrial septum: No defect or patent foramen ovale was identified. Transesophageal echocardiography. 2D and color Doppler.  Normal LVEF   Recent Labs: 08/05/2017: Hemoglobin 11.8; Platelets 327.0  No results found for requested labs within last 8760 hours.   CrCl cannot be calculated (Patient's most recent lab result is older than the maximum 21 days allowed.).   Wt Readings from Last 3 Encounters:  08/05/17 239 lb (108.4 kg)  07/29/17 242 lb (109.8 kg)  05/26/17 243 lb (110.2 kg)     Other studies reviewed: Additional studies/records reviewed today include: summarized above  ASSESSMENT AND PLAN:  1. Persistent AFib     CHA2DS2Vasc is at least 4, on Eliquis     Will get BMET/CBC today  2. HTN     No changes     She is concnerned about the one low BP reading, this has not been recurrent by her home readings, though she wonders about the accuuracy of her machine     Will have her in for BP visit, she will bring her home cuff  3.  Untreated sleep apnea     Counseled she is in the process of further evaluatin to treatment options     Has not yet been able to pursue oral device  4. DOE     is chronic, not escalating/changing in years     Likely is multifactorial, with obesity, sedentary lifestyle      5. Obesity     Re-discussed exercise strategies given chronic back issues, diet   Disposition: BP visit in a month, otherwise 6 months, sooner if needed.   Current medicines are reviewed at length with the patient today.  The patient did not have any concerns regarding medicines.  Venetia Night, PA-C 01/08/2018 3:38 PM     [address] [place] [address] [phone] (office)  [phone] (fax)

## 2018-01-08 NOTE — Patient Instructions (Signed)
Medication Instructions:   Your physician recommends that you continue on your current medications as directed. Please refer to the Current Medication list given to you today.   If you need a refill on your cardiac medications before your next appointment, please call your pharmacy.  Labwork: BMET AND CBC TODAY    Testing/Procedures: NONE ORDERED  TODAY    Follow-Up:   WITH HTN CLINIC IN ONE MONTH     Your physician wants you to follow-up in:  IN  [place] will receive a reminder letter in the mail two months in advance. If you don't receive a letter, please call our office to schedule the follow-up appointment.      Any Other Special Instructions Will Be Listed Below (If Applicable).

## 2018-01-09 LAB — BASIC METABOLIC PANEL
BUN/Creatinine Ratio: 18 (ref 12–28)
BUN: 15 mg/dL (ref 8–27)
CO2: 26 mmol/L (ref 20–29)
CREATININE: 0.82 mg/dL (ref 0.57–1.00)
Calcium: 8.9 mg/dL (ref 8.7–10.3)
Chloride: 99 mmol/L (ref 96–106)
GFR calc non Af Amer: 75 mL/min/{1.73_m2} (ref >59)
GFR, EST AFRICAN AMERICAN: 87 mL/min/{1.73_m2} (ref >59)
GLUCOSE: 224 mg/dL — AB (ref 65–99)
Potassium: 4.8 mmol/L (ref 3.5–5.2)
SODIUM: 140 mmol/L (ref 134–144)

## 2018-01-09 LAB — CBC
HEMATOCRIT: 36.3 % (ref 34.0–46.6)
Hemoglobin: 12.1 g/dL (ref 11.1–15.9)
MCH: 26 pg — ABNORMAL LOW (ref 26.6–33.0)
MCHC: 33.3 g/dL (ref 31.5–35.7)
MCV: 78 fL — AB (ref 79–97)
PLATELETS: 307 10*3/uL (ref 150–379)
RBC: 4.66 x10E6/uL (ref 3.77–5.28)
RDW: 15.8 % — AB (ref 12.3–15.4)
WBC: 12.2 10*3/uL — ABNORMAL HIGH (ref 3.4–10.8)

## 2018-01-21 ENCOUNTER — Other Ambulatory Visit: Payer: Self-pay | Admitting: Internal Medicine

## 2018-01-21 DIAGNOSIS — I480 Paroxysmal atrial fibrillation: Secondary | ICD-10-CM

## 2018-01-21 NOTE — Telephone Encounter (Signed)
Pt was seen in office on 01/08/18 by PA. Age 67 yrs old. Wt at time of visit was 109.3Kg. SCr on 01/08/18 was 0.82. Thus will will refill Eliquis 5mg  BID.

## 2018-02-09 ENCOUNTER — Ambulatory Visit (INDEPENDENT_AMBULATORY_CARE_PROVIDER_SITE_OTHER): Payer: 59 | Admitting: Pharmacist

## 2018-02-09 ENCOUNTER — Encounter: Payer: Self-pay | Admitting: Pharmacist

## 2018-02-09 VITALS — BP 144/66 | HR 77

## 2018-02-09 DIAGNOSIS — I10 Essential (primary) hypertension: Secondary | ICD-10-CM | POA: Diagnosis not present

## 2018-02-09 MED ORDER — LISINOPRIL 40 MG PO TABS: 40.0000 mg | ORAL_TABLET | Freq: Every day | ORAL | 11 refills | Status: DC

## 2018-02-09 NOTE — Progress Notes (Signed)
Patient ID: Kayla Hendrix                 DOB: 1950/12/23                      MRN: [id number]     HPI: Kayla Hendrix is a 67 y.o. female referred by Tommye Standard, PA to HTN clinic. PMH is significant for OSA intolerant of CPAP, HTN, HLD, DM, obesity, and persistent afib. In February, she experienced low BP readings in the 31D systolic and was advised to start taking 1/2 tablet of her metoprolol. Pt presents today to assess accuracy of her home BP cuff.  Patient presents today in good spirits. She denies dizziness, blurred vision, falls, or headache. She has been monitoring her BP at home and most readings range 130-140/60-80. Readings a bit higher when she is in afib however discussed that automatic cuff is not as accurate then. AM readings are also a bit higher however she is checking her BP at the same time she is due for her HCTZ and lisinopril dose. She has had 2 episodes of low BP readings in the 90s/50-60 and states she felt odd but cannot place any more specific symptoms.   Her son is building her a stationary bike however exercise is currently minimal. She has chronic back pain which limits exercise. She has a stressful job, denies OTC NSAID use. She uses a salt substitute in her food. Home BP measures accurately compared to clinic reading.  Home BP cuff - 143/67, HR 78 Clinic BP cuff - 144/66, HR 77  Current HTN meds: diltiazem 360mg  daily, HCTZ 25mg  daily, lisinopril 20mg  daily, metoprolol tartrate 25mg  BID  BP goal: <130/96mmHg  Family History: The patient's family history includes Cancer in her father; Diabetes in her unknown relative; Hypertension in her unknown relative; Stomach cancer in her father and maternal grandmother.  Social History: The patient  reports that she quit smoking about 9 years ago. she has never used smokeless tobacco. She reports that she does not drink alcohol or use drugs.   Diet: Crackers in the morning. Fruit with lunch. Dinner - meat, veggie, and carb.  Likes soup - will look for low sodium. Drinks water and diet caffeine free Pepsi. Uses salt substitute.  Exercise: Minimal due to back pain. Her son is building a stationary bike for her to use.  Home BP readings: 130-140/60-80 mostly, a few elevated readings with systolic BP 176-160  Wt Readings from Last 3 Encounters:  01/08/18 241 lb (109.3 kg)  08/05/17 239 lb (108.4 kg)  07/29/17 242 lb (109.8 kg)   BP Readings from Last 3 Encounters:  01/08/18 132/66  08/05/17 126/68  07/29/17 (!) 138/58   Pulse Readings from Last 3 Encounters:  01/08/18 68  08/05/17 66  07/29/17 67    Renal function: CrCl cannot be calculated (Patient's most recent lab result is older than the maximum 21 days allowed.).  Past Medical History:  Diagnosis Date  . Allergy   . Anemia   . Anxiety   . Atrial fibrillation (HCC)    paroxysmal only asa 81 mg, no other blood thinner, the xarelto she was on gave her HAs  . CAD (coronary artery disease)    cath 2006- nonobstructive CAD  . Cataract   . Depression   . Diabetes mellitus without complication ([place])   . GERD (gastroesophageal reflux disease)   . Glucose intolerance (impaired glucose tolerance)   . Hiatal hernia   .  History of transesophageal echocardiography (TEE) for monitoring    TEE 3/17:  EF 55%, no RWMA, mild plaque in descending aorta, mild MR, mod BAE, normal RVF  . HL (hearing loss)   . HTN (hypertension)   . Hyperlipidemia   . Hyperplastic colon polyp   . Internal hemorrhoids   . Obesity   . OSA (obstructive sleep apnea)    noncompliant with CPAP  . Tubular adenoma of colon     Current Outpatient Medications on File Prior to Visit  Medication Sig Dispense Refill  . clonazePAM (KLONOPIN) 0.5 MG tablet Take 0.5 mg by mouth daily.     Marland Kitchen diltiazem (TIAZAC) 360 MG 24 hr capsule Take 360 mg by mouth daily.    Marland Kitchen ELIQUIS 5 MG TABS tablet TAKE 1 TABLET BY MOUTH TWICE A DAY 60 tablet 11  . Fe Fum-FePoly-FA-Vit C-Vit B3 (INTEGRA F) 125-1  MG CAPS Take 125 mg by mouth daily. 30 capsule 3  . glimepiride (AMARYL) 4 MG tablet Take 4 mg by mouth 2 (two) times daily.  99  . hydrochlorothiazide (HYDRODIURIL) 25 MG tablet Take 25 mg by mouth daily.    Marland Kitchen lisinopril (PRINIVIL,ZESTRIL) 20 MG tablet Take 20 mg by mouth daily.    . metFORMIN (GLUCOPHAGE) 500 MG tablet Take 1,000 mg by mouth 2 (two) times daily with a meal.     . metoprolol tartrate (LOPRESSOR) 50 MG tablet Take 25 mg by mouth 2 (two) times daily.   8  . omeprazole (PRILOSEC) 20 MG capsule Take 20 mg by mouth 2 (two) times daily.      . sitaGLIPtin (JANUVIA) 100 MG tablet Take 100 mg by mouth daily.     Current Facility-Administered Medications on File Prior to Visit  Medication Dose Route Frequency Provider Last Rate Last Dose  . 0.9 %  sodium chloride infusion  500 mL Intravenous Continuous Pyrtle, Lajuan Lines, MD        Allergies  Allergen Reactions  . Hydrocodone Itching     Assessment/Plan:  1. Hypertension - BP above goal <130/41mmHg in clinic and with most home readings. Will increase lisinopril to 40mg  and move to evening dosing for better BP coverage, continue HCTZ 25mg  daily, metoprolol tartrate 25mg  BID, and diltiazem 360mg  daily. Encouraged pt to look for low sodium soup and to start using her stationary bike a few days a week. Advised pt not to check her BP when she is in afib as readings on automatic cuff are not as accurate then. F/u in clinic in 2 weeks for BP check and BMET.   Leesha Veno E. Supple, PharmD, CPP, [facility] [address] [place], [place] [address] Phone: [phone]; Fax: [phone] 02/09/2018 4:32 PM

## 2018-02-09 NOTE — Patient Instructions (Addendum)
It was nice to meet you today  Your blood pressure goal is < 13/19mmHg  Increase your lisinopril to 40mg  once a day - take this in the evening  Continue taking your other medications  Look for low sodium or heart healthy soup options  Start using your stationary bike a few days a week  Continue to monitor and record your blood pressure readings  Follow up in clinic in 2-3 weeks for blood pressure check and lab work  Call the blood pressure clinic with any side effects or concerns [phone]

## 2018-02-24 ENCOUNTER — Ambulatory Visit: Payer: 59

## 2018-02-24 ENCOUNTER — Other Ambulatory Visit: Payer: 59

## 2018-03-11 ENCOUNTER — Encounter: Payer: Self-pay | Admitting: *Deleted

## 2018-03-14 ENCOUNTER — Encounter: Payer: Self-pay | Admitting: Internal Medicine

## 2018-04-27 ENCOUNTER — Other Ambulatory Visit: Payer: Self-pay | Admitting: Internal Medicine

## 2018-04-27 DIAGNOSIS — R945 Abnormal results of liver function studies: Principal | ICD-10-CM

## 2018-04-27 DIAGNOSIS — R7989 Other specified abnormal findings of blood chemistry: Secondary | ICD-10-CM

## 2018-05-07 ENCOUNTER — Ambulatory Visit
Admission: RE | Admit: 2018-05-07 | Discharge: 2018-05-07 | Disposition: A | Discharge status: Home / Self Care | Payer: 59 | Source: Ambulatory Visit | Attending: Internal Medicine | Admitting: Internal Medicine

## 2018-05-07 DIAGNOSIS — R7989 Other specified abnormal findings of blood chemistry: Secondary | ICD-10-CM

## 2018-05-07 DIAGNOSIS — R945 Abnormal results of liver function studies: Principal | ICD-10-CM

## 2018-07-22 ENCOUNTER — Other Ambulatory Visit: Payer: Self-pay | Admitting: Physician Assistant

## 2018-07-22 MED ORDER — METOPROLOL TARTRATE 50 MG PO TABS: 25.0000 mg | ORAL_TABLET | Freq: Two times a day (BID) | ORAL | 1 refills | Status: DC

## 2018-10-06 ENCOUNTER — Telehealth: Payer: Self-pay | Admitting: Internal Medicine

## 2018-10-06 NOTE — Telephone Encounter (Signed)
New message    Pt c/o BP issue: STAT if pt c/o blurred vision, one-sided weakness or slurred speech  1. What are your last 5 BP readings? 143/106 98/55  2. Are you having any other symptoms (ex. Dizziness, headache, blurred vision, passed out)? no  3. What is your BP issue? Pt said she has been having arrhythmia and her bp is dropping. She would like to know what she should do.

## 2018-10-06 NOTE — Telephone Encounter (Signed)
Call returned to Pt.  Per Pt she has been in afib today.  Doesn't feel bad, "maybe just a little fatigued with activity".  Pt concerned about her BP readings while in afib.  Reminded Pt she was instructed by pharmacist not to take her blood pressure while she was in afib because it would not be as accurate.  Advised Pt if she did not feel bad to not monitor her BP until she was out of afib.  Pt can sometimes feel when she goes back into SR but some times cannot.  Advised Pt to monitor her pulse.  If less than 100 and she does not feel bad she could continue to monitor.    If she is still in afib on Monday advised Pt to call afib clinic for an appt.  Advised Pt if pulse greater than 100 she could take an extra 1/2 tablet of her metoprolol.  Pt indicates understanding.  Gave Pt phone # to afib clinic.

## 2018-10-11 ENCOUNTER — Encounter (HOSPITAL_COMMUNITY): Payer: Self-pay | Admitting: Nurse Practitioner

## 2018-10-11 ENCOUNTER — Ambulatory Visit (HOSPITAL_COMMUNITY)
Admission: RE | Admit: 2018-10-11 | Discharge: 2018-10-11 | Disposition: A | Discharge status: Home / Self Care | Payer: 59 | Source: Ambulatory Visit | Attending: Nurse Practitioner | Admitting: Nurse Practitioner

## 2018-10-11 VITALS — BP 120/72 | HR 119 | Ht 66.0 in | Wt 232.0 lb

## 2018-10-11 DIAGNOSIS — Z87891 Personal history of nicotine dependence: Secondary | ICD-10-CM | POA: Insufficient documentation

## 2018-10-11 DIAGNOSIS — Z79899 Other long term (current) drug therapy: Secondary | ICD-10-CM | POA: Diagnosis not present

## 2018-10-11 DIAGNOSIS — Z7901 Long term (current) use of anticoagulants: Secondary | ICD-10-CM | POA: Diagnosis not present

## 2018-10-11 DIAGNOSIS — K219 Gastro-esophageal reflux disease without esophagitis: Secondary | ICD-10-CM | POA: Diagnosis not present

## 2018-10-11 DIAGNOSIS — Z7984 Long term (current) use of oral hypoglycemic drugs: Secondary | ICD-10-CM | POA: Insufficient documentation

## 2018-10-11 DIAGNOSIS — E785 Hyperlipidemia, unspecified: Secondary | ICD-10-CM | POA: Diagnosis not present

## 2018-10-11 DIAGNOSIS — Z90710 Acquired absence of both cervix and uterus: Secondary | ICD-10-CM | POA: Diagnosis not present

## 2018-10-11 DIAGNOSIS — E119 Type 2 diabetes mellitus without complications: Secondary | ICD-10-CM | POA: Diagnosis not present

## 2018-10-11 DIAGNOSIS — I2510 Atherosclerotic heart disease of native coronary artery without angina pectoris: Secondary | ICD-10-CM | POA: Diagnosis not present

## 2018-10-11 DIAGNOSIS — G4733 Obstructive sleep apnea (adult) (pediatric): Secondary | ICD-10-CM | POA: Diagnosis not present

## 2018-10-11 DIAGNOSIS — I10 Essential (primary) hypertension: Secondary | ICD-10-CM | POA: Insufficient documentation

## 2018-10-11 DIAGNOSIS — I480 Paroxysmal atrial fibrillation: Secondary | ICD-10-CM | POA: Diagnosis not present

## 2018-10-11 DIAGNOSIS — I4819 Other persistent atrial fibrillation: Secondary | ICD-10-CM | POA: Diagnosis not present

## 2018-10-11 DIAGNOSIS — Z9889 Other specified postprocedural states: Secondary | ICD-10-CM | POA: Diagnosis not present

## 2018-10-11 LAB — CBC
HCT: 37.8 % (ref 36.0–46.0)
Hemoglobin: 11.9 g/dL — ABNORMAL LOW (ref 12.0–15.0)
MCH: 24.7 pg — ABNORMAL LOW (ref 26.0–34.0)
MCHC: 31.5 g/dL (ref 30.0–36.0)
MCV: 78.6 fL — ABNORMAL LOW (ref 80.0–100.0)
Platelets: 301 10*3/uL (ref 150–400)
RBC: 4.81 MIL/uL (ref 3.87–5.11)
RDW: 15.6 % — ABNORMAL HIGH (ref 11.5–15.5)
WBC: 9.2 10*3/uL (ref 4.0–10.5)
nRBC: 0 % (ref 0.0–0.2)

## 2018-10-11 LAB — BASIC METABOLIC PANEL
Anion gap: 14 (ref 5–15)
BUN: 13 mg/dL (ref 8–23)
CO2: 22 mmol/L (ref 22–32)
Calcium: 8.4 mg/dL — ABNORMAL LOW (ref 8.9–10.3)
Chloride: 100 mmol/L (ref 98–111)
Creatinine, Ser: 1.07 mg/dL — ABNORMAL HIGH (ref 0.44–1.00)
GFR calc Af Amer: >60 mL/min (ref >60)
GFR calc non Af Amer: 54 mL/min — ABNORMAL LOW (ref >60)
GLUCOSE: 194 mg/dL — AB (ref 70–99)
Potassium: 4.1 mmol/L (ref 3.5–5.1)
Sodium: 136 mmol/L (ref 135–145)

## 2018-10-11 NOTE — Progress Notes (Addendum)
Primary Care Physician: Levin Erp, MD Referring Physician: Dr. [name] is a 67 y.o. female with a h/o paroxysmal afib that is in the afib clinic for persistent afib that has been present since last week. She rarely has to have a cardioversion as when she has afib , she will usually return to SR within a few hours. She denies any known trigger. Last cardioversion was 2017.  Today, she denies symptoms of palpitations, chest pain, shortness of breath, orthopnea, PND, lower extremity edema, dizziness, presyncope, syncope, or neurologic sequela. The patient is tolerating medications without difficulties and is otherwise without complaint today.   Past Medical History:  Diagnosis Date  . Allergy   . Anemia   . Anxiety   . Atrial fibrillation (HCC)    paroxysmal only asa 81 mg, no other blood thinner, the xarelto she was on gave her HAs  . CAD (coronary artery disease)    cath 2006- nonobstructive CAD  . Cataract   . Depression   . Diabetes mellitus without complication ([place])   . GERD (gastroesophageal reflux disease)   . Glucose intolerance (impaired glucose tolerance)   . Hiatal hernia   . History of transesophageal echocardiography (TEE) for monitoring    TEE 3/17:  EF 55%, no RWMA, mild plaque in descending aorta, mild MR, mod BAE, normal RVF  . HL (hearing loss)   . HTN (hypertension)   . Hyperlipidemia   . Hyperplastic colon polyp   . Internal hemorrhoids   . Obesity   . OSA (obstructive sleep apnea)    noncompliant with CPAP  . Tubular adenoma of colon    Past Surgical History:  Procedure Laterality Date  . CARDIOVERSION N/A 01/23/2016   Procedure: CARDIOVERSION;  Surgeon: Larey Dresser, MD;  Location: [place];  Service: Cardiovascular;  Laterality: N/A;  . CESAREAN SECTION    . GANGLION CYST EXCISION Left 1970s  . TEE WITHOUT CARDIOVERSION N/A 01/23/2016   Procedure: TRANSESOPHAGEAL ECHOCARDIOGRAM (TEE);  Surgeon: Larey Dresser, MD;  Location:  [place];  Service: Cardiovascular;  Laterality: N/A;  . TOTAL ABDOMINAL HYSTERECTOMY    . WRIST FRACTURE SURGERY  2010   wrist fracture repair with metal rod    Current Outpatient Medications  Medication Sig Dispense Refill  . clonazePAM (KLONOPIN) 0.5 MG tablet Take 0.5 mg by mouth daily.     . Cyanocobalamin (VITAMIN B 12 PO) Take by mouth.    . diltiazem (TIAZAC) 360 MG 24 hr capsule Take 360 mg by mouth daily.    Marland Kitchen ELIQUIS 5 MG TABS tablet TAKE 1 TABLET BY MOUTH TWICE A DAY 60 tablet 11  . glimepiride (AMARYL) 4 MG tablet Take 4 mg by mouth 2 (two) times daily.  99  . hydrochlorothiazide (HYDRODIURIL) 25 MG tablet Take 25 mg by mouth daily.    Marland Kitchen lisinopril (PRINIVIL,ZESTRIL) 40 MG tablet Take 1 tablet (40 mg total) by mouth daily. (Patient taking differently: Take 40 mg by mouth as directed. ) 30 tablet 11  . metFORMIN (GLUCOPHAGE) 500 MG tablet Take 1,000 mg by mouth 2 (two) times daily with a meal.     . metoprolol tartrate (LOPRESSOR) 50 MG tablet Take by mouth. Take one tablet in the morning and 1/2 tablet in the evening.    Marland Kitchen omeprazole (PRILOSEC) 20 MG capsule Take 20 mg by mouth daily.     . saxagliptin HCl (ONGLYZA) 2.5 MG TABS tablet Take 2.5 mg by mouth daily.    Marland Kitchen  Turmeric ([employer] TURMERIC CURCUMIN) 500 MG CAPS Take by mouth.     No current facility-administered medications for this encounter.     Allergies  Allergen Reactions  . Hydrocodone Itching    Social History   Socioeconomic History  . Marital status: Single    Spouse name: Not on file  . Number of children: Not on file  . Years of education: Not on file  . Highest education level: Not on file  Occupational History  . Not on file  Social Needs  . Financial resource strain: Not on file  . Food insecurity:    Worry: Not on file    Inability: Not on file  . Transportation needs:    Medical: Not on file    Non-medical: Not on file  Tobacco Use  . Smoking status: Former Smoker    Last attempt to  quit: 2010    Years since quitting: 9.9  . Smokeless tobacco: Never Used  . Tobacco comment: 15-pack-year smoker  Substance and Sexual Activity  . Alcohol use: No    Comment: rarely  . Drug use: No  . Sexual activity: Not on file  Lifestyle  . Physical activity:    Days per week: Not on file    Minutes per session: Not on file  . Stress: Not on file  Relationships  . Social connections:    Talks on phone: Not on file    Gets together: Not on file    Attends religious service: Not on file    Active member of club or organization: Not on file    Attends meetings of clubs or organizations: Not on file    Relationship status: Not on file  . Intimate partner violence:    Fear of current or ex partner: Not on file    Emotionally abused: Not on file    Physically abused: Not on file    Forced sexual activity: Not on file  Other Topics Concern  . Not on file  Social History Narrative   Pt. Lives in [place] with her son. 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     Family History  Problem Relation Age of Onset  . Cancer Father   . Stomach cancer Father   . Diabetes Unknown        noninsulin dependant DM  . Hypertension Unknown   . Stomach cancer Maternal Grandmother   . Colon cancer Neg Hx   . Colon polyps Neg Hx   . Esophageal cancer Neg Hx   . Rectal cancer Neg Hx     ROS- All systems are reviewed and negative except as per the HPI above  Physical Exam: Vitals:   10/11/18 1421  BP: 120/72  Pulse: (!) 119  Weight: 105.2 kg  Height: 5\' 6"  (1.676 m)   Wt Readings from Last 3 Encounters:  10/11/18 105.2 kg  01/08/18 109.3 kg  08/05/17 108.4 kg    Labs: Lab Results  Component Value Date   NA 140 01/08/2018   K 4.8 01/08/2018   CL 99 01/08/2018   CO2 26 01/08/2018   GLUCOSE 224 (H) 01/08/2018   BUN 15 01/08/2018   CREATININE 0.82 01/08/2018   CALCIUM 8.9 01/08/2018   MG 1.7  01/02/2016   Lab Results  Component Value Date   INR 1.0 04/08/2012   Lab Results  Component Value Date   CHOL 159 04/10/2012  HDL 34 (L) 04/10/2012   LDLCALC 79 04/10/2012   TRIG 230 (H) 04/10/2012     GEN- The patient is well appearing, alert and oriented x 3 today.   Head- normocephalic, atraumatic Eyes-  Sclera clear, conjunctiva pink Ears- hearing intact Oropharynx- clear Neck- supple, no JVP Lymph- no cervical lymphadenopathy Lungs- Clear to ausculation bilaterally, normal work of breathing Heart-irregular rate and rhythm, no murmurs, rubs or gallops, PMI not laterally displaced GI- soft, NT, ND, + BS Extremities- no clubbing, cyanosis, or edema MS- no significant deformity or atrophy Skin- no rash or lesion Psych- euthymic mood, full affect Neuro- strength and sensation are intact  EKG- afib 119 bpm, qrs int 92 bpm, qtc 478 ms, IRBBB, inferior infarct, age undetermined    Assessment and Plan: 1. Paroxysmal afib  Has been in afib x one week States in the past, she will require a cardioversion when she has been in afib for this period of time, last one 2017 Scheduled for 12/11 No missed doses of eliquis 5 mg for at least 3 weeks CHA2DS2VASc score is at least 5 Continue metoprolol and Cardizem without change  Bmet/cbc today   F/u in one week after cardioversion  Butch Penny C. [place], [facility] [address] [place], [place] [address] [phone]

## 2018-10-11 NOTE — Addendum Note (Signed)
Encounter addended by: Sherran Needs, NP on: 10/11/2018 4:07 PM  Actions taken: Sign clinical note

## 2018-10-11 NOTE — Patient Instructions (Signed)
Cardioversion scheduled for Wednesday, December 11th  - Arrive at the [facility] and go to admitting at 12PM  -Do not eat or drink anything after midnight the night prior to your procedure.  - Take all your morning medication with a sip of water prior to arrival.  - You will not be able to drive home after your procedure.

## 2018-10-19 ENCOUNTER — Ambulatory Visit (HOSPITAL_COMMUNITY)
Admission: RE | Admit: 2018-10-19 | Discharge: 2018-10-19 | Disposition: A | Discharge status: Home / Self Care | Payer: 59 | Source: Ambulatory Visit | Attending: Nurse Practitioner | Admitting: Nurse Practitioner

## 2018-10-19 DIAGNOSIS — R9431 Abnormal electrocardiogram [ECG] [EKG]: Secondary | ICD-10-CM | POA: Insufficient documentation

## 2018-10-19 DIAGNOSIS — I4891 Unspecified atrial fibrillation: Secondary | ICD-10-CM | POA: Diagnosis present

## 2018-10-19 NOTE — Progress Notes (Signed)
Pt in to confirm SR as she feels she converted. Ekg confirms SR. Will cancel cardioversion. In SR at 66 bpm, IRBBB.

## 2018-10-19 NOTE — Progress Notes (Signed)
Pt in for repeat EKG feeling she may have converted to NSR on her own.  To be reviewed by Roderic Palau, NP

## 2018-10-20 ENCOUNTER — Ambulatory Visit (HOSPITAL_COMMUNITY): Admission: RE | Admit: 2018-10-20 | Payer: 59 | Source: Home / Self Care | Admitting: Cardiology

## 2018-10-20 ENCOUNTER — Other Ambulatory Visit: Payer: Self-pay | Admitting: Physician Assistant

## 2018-10-20 ENCOUNTER — Encounter (HOSPITAL_COMMUNITY): Admission: RE | Payer: Self-pay | Source: Home / Self Care

## 2018-10-20 SURGERY — CARDIOVERSION
Anesthesia: General

## 2018-10-27 ENCOUNTER — Encounter (HOSPITAL_COMMUNITY): Payer: Self-pay | Admitting: Nurse Practitioner

## 2018-10-27 ENCOUNTER — Ambulatory Visit (HOSPITAL_COMMUNITY): Payer: 59 | Admitting: Nurse Practitioner

## 2018-10-27 ENCOUNTER — Ambulatory Visit (HOSPITAL_COMMUNITY)
Admission: RE | Admit: 2018-10-27 | Discharge: 2018-10-27 | Disposition: A | Discharge status: Home / Self Care | Payer: 59 | Source: Ambulatory Visit | Attending: Nurse Practitioner | Admitting: Nurse Practitioner

## 2018-10-27 VITALS — BP 130/58 | HR 68 | Ht 66.0 in | Wt 234.0 lb

## 2018-10-27 DIAGNOSIS — E785 Hyperlipidemia, unspecified: Secondary | ICD-10-CM | POA: Diagnosis not present

## 2018-10-27 DIAGNOSIS — K219 Gastro-esophageal reflux disease without esophagitis: Secondary | ICD-10-CM | POA: Diagnosis not present

## 2018-10-27 DIAGNOSIS — Z7984 Long term (current) use of oral hypoglycemic drugs: Secondary | ICD-10-CM | POA: Diagnosis not present

## 2018-10-27 DIAGNOSIS — E669 Obesity, unspecified: Secondary | ICD-10-CM | POA: Insufficient documentation

## 2018-10-27 DIAGNOSIS — Z79899 Other long term (current) drug therapy: Secondary | ICD-10-CM | POA: Diagnosis not present

## 2018-10-27 DIAGNOSIS — E119 Type 2 diabetes mellitus without complications: Secondary | ICD-10-CM | POA: Insufficient documentation

## 2018-10-27 DIAGNOSIS — G4733 Obstructive sleep apnea (adult) (pediatric): Secondary | ICD-10-CM | POA: Insufficient documentation

## 2018-10-27 DIAGNOSIS — I2510 Atherosclerotic heart disease of native coronary artery without angina pectoris: Secondary | ICD-10-CM | POA: Diagnosis not present

## 2018-10-27 DIAGNOSIS — F329 Major depressive disorder, single episode, unspecified: Secondary | ICD-10-CM | POA: Diagnosis not present

## 2018-10-27 DIAGNOSIS — Z7901 Long term (current) use of anticoagulants: Secondary | ICD-10-CM | POA: Diagnosis not present

## 2018-10-27 DIAGNOSIS — I480 Paroxysmal atrial fibrillation: Secondary | ICD-10-CM | POA: Insufficient documentation

## 2018-10-27 DIAGNOSIS — Z87891 Personal history of nicotine dependence: Secondary | ICD-10-CM | POA: Diagnosis not present

## 2018-10-27 DIAGNOSIS — I4510 Unspecified right bundle-branch block: Secondary | ICD-10-CM | POA: Diagnosis not present

## 2018-10-27 DIAGNOSIS — F419 Anxiety disorder, unspecified: Secondary | ICD-10-CM | POA: Insufficient documentation

## 2018-10-27 NOTE — Progress Notes (Signed)
Primary Care Physician: Levin Erp, MD Referring Physician: Dr. [name] is a 68 y.o. female with a h/o paroxysmal afib that was in the afib clinic 1-2 weeks ago for persistent afib that has been present since last week. She rarely has to have a cardioversion as when she has afib, she will usually return to SR within a few hours. She denies any known trigger. Last cardioversion was 2017. Cardioversion was scheduled but  She went back into NSR and it was cancelled.  She called the clinic as she went back into rate controlled afib yesterday, v rate 68 bpm. Options to restore SR discussed. She had a cath in 2006 and had non obstructive CAD of the LAD.   Today, she denies symptoms of palpitations, chest pain, shortness of breath, orthopnea, PND, lower extremity edema, dizziness, presyncope, syncope, or neurologic sequela. The patient is tolerating medications without difficulties and is otherwise without complaint today.   Past Medical History:  Diagnosis Date  . Allergy   . Anemia   . Anxiety   . Atrial fibrillation (HCC)    paroxysmal only asa 81 mg, no other blood thinner, the xarelto she was on gave her HAs  . CAD (coronary artery disease)    cath 2006- nonobstructive CAD  . Cataract   . Depression   . Diabetes mellitus without complication ([place])   . GERD (gastroesophageal reflux disease)   . Glucose intolerance (impaired glucose tolerance)   . Hiatal hernia   . History of transesophageal echocardiography (TEE) for monitoring    TEE 3/17:  EF 55%, no RWMA, mild plaque in descending aorta, mild MR, mod BAE, normal RVF  . HL (hearing loss)   . HTN (hypertension)   . Hyperlipidemia   . Hyperplastic colon polyp   . Internal hemorrhoids   . Obesity   . OSA (obstructive sleep apnea)    noncompliant with CPAP  . Tubular adenoma of colon    Past Surgical History:  Procedure Laterality Date  . CARDIOVERSION N/A 01/23/2016   Procedure: CARDIOVERSION;  Surgeon:  Larey Dresser, MD;  Location: [place];  Service: Cardiovascular;  Laterality: N/A;  . CESAREAN SECTION    . GANGLION CYST EXCISION Left 1970s  . TEE WITHOUT CARDIOVERSION N/A 01/23/2016   Procedure: TRANSESOPHAGEAL ECHOCARDIOGRAM (TEE);  Surgeon: Larey Dresser, MD;  Location: [place];  Service: Cardiovascular;  Laterality: N/A;  . TOTAL ABDOMINAL HYSTERECTOMY    . WRIST FRACTURE SURGERY  2010   wrist fracture repair with metal rod    Current Outpatient Medications  Medication Sig Dispense Refill  . clonazePAM (KLONOPIN) 0.5 MG tablet Take 0.5 mg by mouth daily.     . Cyanocobalamin (VITAMIN B 12 PO) Take by mouth.    . diltiazem (TIAZAC) 360 MG 24 hr capsule Take 360 mg by mouth daily.    Marland Kitchen ELIQUIS 5 MG TABS tablet TAKE 1 TABLET BY MOUTH TWICE A DAY 60 tablet 11  . glimepiride (AMARYL) 4 MG tablet Take 4 mg by mouth 2 (two) times daily.  99  . hydrochlorothiazide (HYDRODIURIL) 25 MG tablet Take 25 mg by mouth daily.    Marland Kitchen lisinopril (PRINIVIL,ZESTRIL) 40 MG tablet Take 1 tablet (40 mg total) by mouth daily. (Patient taking differently: Take 40 mg by mouth as directed. ) 30 tablet 11  . metFORMIN (GLUCOPHAGE) 500 MG tablet Take 1,000 mg by mouth 2 (two) times daily with a meal.     . metoprolol tartrate (LOPRESSOR)  50 MG tablet Take 1 tablet by mouth in the morning; 1/2 tablet by mouth in the evening. 30 tablet 2  . omeprazole (PRILOSEC) 20 MG capsule Take 20 mg by mouth daily.     . saxagliptin HCl (ONGLYZA) 2.5 MG TABS tablet Take 2.5 mg by mouth daily.    . Turmeric ([employer]S TURMERIC CURCUMIN) 500 MG CAPS Take by mouth.     No current facility-administered medications for this encounter.     Allergies  Allergen Reactions  . Hydrocodone Itching    Social History   Socioeconomic History  . Marital status: Single    Spouse name: Not on file  . Number of children: Not on file  . Years of education: Not on file  . Highest education level: Not on file  Occupational  History  . Not on file  Social Needs  . Financial resource strain: Not on file  . Food insecurity:    Worry: Not on file    Inability: Not on file  . Transportation needs:    Medical: Not on file    Non-medical: Not on file  Tobacco Use  . Smoking status: Former Smoker    Last attempt to quit: 2010    Years since quitting: 9.9  . Smokeless tobacco: Never Used  . Tobacco comment: 15-pack-year smoker  Substance and Sexual Activity  . Alcohol use: No    Comment: rarely  . Drug use: No  . Sexual activity: Not on file  Lifestyle  . Physical activity:    Days per week: Not on file    Minutes per session: Not on file  . Stress: Not on file  Relationships  . Social connections:    Talks on phone: Not on file    Gets together: Not on file    Attends religious service: Not on file    Active member of club or organization: Not on file    Attends meetings of clubs or organizations: Not on file    Relationship status: Not on file  . Intimate partner violence:    Fear of current or ex partner: Not on file    Emotionally abused: Not on file    Physically abused: Not on file    Forced sexual activity: Not on file  Other Topics Concern  . Not on file  Social History Narrative   Pt. Lives in [place] with her son. 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     Family History  Problem Relation Age of Onset  . Cancer Father   . Stomach cancer Father   . Diabetes Unknown        noninsulin dependant DM  . Hypertension Unknown   . Stomach cancer Maternal Grandmother   . Colon cancer Neg Hx   . Colon polyps Neg Hx   . Esophageal cancer Neg Hx   . Rectal cancer Neg Hx     ROS- All systems are reviewed and negative except as per the HPI above  Physical Exam: Vitals:   10/27/18 1405  BP: (!) 130/58  Pulse: 68  Weight: 106.1 kg  Height: 5\' 6"  (1.676 m)   Wt Readings from Last 3 Encounters:  10/27/18  106.1 kg  10/11/18 105.2 kg  01/08/18 109.3 kg    Labs: Lab Results  Component Value Date   NA 136 10/11/2018   K 4.1 10/11/2018   CL 100 10/11/2018  CO2 22 10/11/2018   GLUCOSE 194 (H) 10/11/2018   BUN 13 10/11/2018   CREATININE 1.07 (H) 10/11/2018   CALCIUM 8.4 (L) 10/11/2018   MG 1.7 01/02/2016   Lab Results  Component Value Date   INR 1.0 04/08/2012   Lab Results  Component Value Date   CHOL 159 04/10/2012   HDL 34 (L) 04/10/2012   LDLCALC 79 04/10/2012   TRIG 230 (H) 04/10/2012     GEN- The patient is well appearing, alert and oriented x 3 today.   Head- normocephalic, atraumatic Eyes-  Sclera clear, conjunctiva pink Ears- hearing intact Oropharynx- clear Neck- supple, no JVP Lymph- no cervical lymphadenopathy Lungs- Clear to ausculation bilaterally, normal work of breathing Heart-irregular rate and rhythm, no murmurs, rubs or gallops, PMI not laterally displaced GI- soft, NT, ND, + BS Extremities- no clubbing, cyanosis, or edema MS- no significant deformity or atrophy Skin- no rash or lesion Psych- euthymic mood, full affect Neuro- strength and sensation are intact  EKG- afib 68  bpm, qrs int 94 bpm, qtc 467 ms, IRBBB   Cath 2006- CONCLUSION:  1.  Well-preserved overall left ventricular function.  2.  Calcification of mid left anterior descending artery without critical      stenosis.  3.  ScStudy Conclusions Echo-08/2017 - Left ventricle: The cavity size was normal. Wall thickness was   increased in a pattern of mild LVH. Systolic function was   vigorous. The estimated ejection fraction was in the range of 65%   to 70%. Left ventricular diastolic function parameters were   normal.attered luminal irregularities throughout the coronary system.   Assessment and Plan: 1. Paroxysmal afib  Has had increase of burden lately She is well rate controlled Discussed options to keep in SR She did have CAD in 2006 of LAD( 30%) and could have increased  since then and would like to avoid I would like to avoid amiodarone for potential side effects She will consider sotalol or Tikosyn and will check on price of drug Multaq would be cost prohibitive Continue eliquis 5 mg bid CHA2DS2VASc score is at least 5 Continue metoprolol and Cardizem without change    She will call back to further discuss once price of drug is known  Butch Penny C. Oluwatosin Bracy, [facility] [address] [place], [place] [address] [phone]

## 2018-11-02 ENCOUNTER — Telehealth (HOSPITAL_COMMUNITY): Payer: Self-pay | Admitting: *Deleted

## 2018-11-02 NOTE — Telephone Encounter (Signed)
Patient called in stating she is becoming more symptomatic with af - some bp spikes (130/105 HR 98 today) concerned regarding this. Pt will try increasing metoprolol to 50mg  BID and see if this helps her BP/HR be more controlled. Patient still unsure of which way to go on rhythm control. Tikosyn was $50 and sotalol was $5 a month. Pt would like to try increasing metoprolol and call back later in the week with decision. If Butch Penny has preference of tikosyn versus sotalol please let pt know.

## 2019-01-18 ENCOUNTER — Other Ambulatory Visit: Payer: Self-pay | Admitting: Internal Medicine

## 2019-01-18 DIAGNOSIS — I480 Paroxysmal atrial fibrillation: Secondary | ICD-10-CM

## 2019-01-18 NOTE — Telephone Encounter (Signed)
Eliquis 5mg  refill request received; pt is 68 yrs old, wt-106.1kg, Crea-1.07 on 10/11/2018, last seen by Roderic Palau on 10/27/2018; will send in refil to requested pharmacy.

## 2019-02-14 ENCOUNTER — Other Ambulatory Visit: Payer: Self-pay | Admitting: Physician Assistant

## 2019-02-14 NOTE — Telephone Encounter (Signed)
Please confirm with the patient how she is currently taking the drug, and who changed the dosing to the current.  If she is feeling well, and BP/HR are OK, she can have refills.  Tommye Standard, PA-C

## 2019-02-14 NOTE — Telephone Encounter (Signed)
Looks like she has been taking 1/2 tab (25mg ) BID since I last saw her  Again, if she is feeling well, her HR/BP are OK, would be OK to refill at 25mg  BID  THANKS renee

## 2019-02-22 ENCOUNTER — Telehealth: Payer: Self-pay | Admitting: Internal Medicine

## 2019-02-22 NOTE — Telephone Encounter (Signed)
Pt concerned, states her stool has been pale grey for the past few days. Discussed with pt that this is probably related to something she has eaten. Pt has not noticed any blood and stool is not black. Pt instructed to keep an eye on things and contact us if there are any changes.

## 2019-02-22 NOTE — Telephone Encounter (Signed)
Pt reported that she has noticed grey/pale stool.  Please advise.

## 2019-05-03 ENCOUNTER — Other Ambulatory Visit: Payer: Self-pay | Admitting: Physician Assistant

## 2019-05-27 ENCOUNTER — Other Ambulatory Visit: Payer: Self-pay | Admitting: Nurse Practitioner

## 2019-05-27 ENCOUNTER — Other Ambulatory Visit (HOSPITAL_COMMUNITY): Payer: Self-pay | Admitting: *Deleted

## 2019-06-03 ENCOUNTER — Other Ambulatory Visit: Payer: Self-pay | Admitting: Internal Medicine

## 2019-06-03 DIAGNOSIS — Z1231 Encounter for screening mammogram for malignant neoplasm of breast: Secondary | ICD-10-CM

## 2019-06-22 ENCOUNTER — Other Ambulatory Visit: Payer: Self-pay | Admitting: Nurse Practitioner

## 2019-07-07 ENCOUNTER — Encounter: Payer: Self-pay | Admitting: *Deleted

## 2019-07-12 ENCOUNTER — Telehealth: Payer: Self-pay | Admitting: *Deleted

## 2019-07-12 ENCOUNTER — Ambulatory Visit: Payer: 59 | Admitting: Gastroenterology

## 2019-07-12 ENCOUNTER — Other Ambulatory Visit: Payer: Self-pay

## 2019-07-12 ENCOUNTER — Encounter: Payer: Self-pay | Admitting: Gastroenterology

## 2019-07-12 VITALS — BP 142/70 | HR 73 | Temp 98.4°F | Ht 66.0 in | Wt 233.0 lb

## 2019-07-12 DIAGNOSIS — K2270 Barrett's esophagus without dysplasia: Secondary | ICD-10-CM

## 2019-07-12 DIAGNOSIS — D509 Iron deficiency anemia, unspecified: Secondary | ICD-10-CM | POA: Diagnosis not present

## 2019-07-12 DIAGNOSIS — K317 Polyp of stomach and duodenum: Secondary | ICD-10-CM

## 2019-07-12 DIAGNOSIS — Z7901 Long term (current) use of anticoagulants: Secondary | ICD-10-CM

## 2019-07-12 DIAGNOSIS — Z86010 Personal history of colonic polyps: Secondary | ICD-10-CM

## 2019-07-12 DIAGNOSIS — K8020 Calculus of gallbladder without cholecystitis without obstruction: Secondary | ICD-10-CM

## 2019-07-12 DIAGNOSIS — K219 Gastro-esophageal reflux disease without esophagitis: Secondary | ICD-10-CM

## 2019-07-12 DIAGNOSIS — K760 Fatty (change of) liver, not elsewhere classified: Secondary | ICD-10-CM

## 2019-07-12 NOTE — Progress Notes (Signed)
HPI :  68 year old female here for reassessment.  She has previously been followed by Dr. Hilarie Fredrickson, I am covering his office today as he is out..  She has been followed for iron deficiency anemia in the past.  She had an evaluation in 2018 for this as below.  It was thought that large gastric polyps in the setting of Eliquis were causing the anemia.  The polyps were hyperplastic with focal intestinal metaplasia.  Given the size of the polyps and pathology results, follow-up endoscopy was recommended 1 year.  She also had a short segment of Barrett's esophagus, she has been maintained on omeprazole 20 mg a day.  She had some adenomatous polyps on colonoscopy.  She continues to take Eliquis for history of atrial fibrillation.  She denies any history of heart attack or strokes.  She denies any routine NSAID use.  She denies any recent reflux symptoms.  She has been taking omeprazole 20 mg once a day and this generally works for her.  She does have some epigastric discomfort at times.  This does not appear reliably related to her eating, seems more so positional when she is bent over at work.  She denies any dysphasia.  No nausea or vomiting.  She denies any new changes with her bowels.  She denies any blood in her stools.  Of note on review of imaging she had an ultrasound of her right upper quadrant performed in June 2019.  She was found to have a 5.2 cm gallstone in her gallbladder.  She was also noted to have hepatic steatosis.  Review of a CT scan in 2013 shows she had a large gallstone in her gallbladder at that time as well.  Do not see any results of recent LFTs on file.  Last hemoglobin 11.9 done in December 2019.  EGD and colonoscopy were performed on 05/26/2017: EGD -- short segment Barrett's esophagus without dysplasia, 2 cm hiatal hernia, 15 mm pedunculated polyp on the greater curve removed by hot snare, 8 mm pedunculated polyp removed from the incisura. Diminutive polyps in the proximal  stomach and normal examined duodenum. Pathology results showed hyperplastic polyps with focal intestinal metaplasia. Negative for H. Pylori. No dysplasia. Small bowel biopsies were normal. Esophageal biopsies consistent with Barrett's esophagus without dysplasia. Colonoscopy -- 5 polyps removed ranging in size from 3-5 mm, internal hemorrhoids and random biopsies. Polyps are found to be tubular adenomas with one hyperplastic polyp. There was no evidence of microscopic colitis.   Past Medical History:  Diagnosis Date  . Allergy   . Anemia   . Anxiety   . Atrial fibrillation (HCC)    paroxysmal only asa 81 mg, no other blood thinner, the xarelto she was on gave her HAs  . Barrett's esophagus 2018  . CAD (coronary artery disease)    cath 2006- nonobstructive CAD  . Cataract   . Depression   . Diabetes mellitus without complication ([place])   . Gastric polyp   . GERD (gastroesophageal reflux disease)   . Glucose intolerance (impaired glucose tolerance)   . Hiatal hernia   . History of transesophageal echocardiography (TEE) for monitoring    TEE 3/17:  EF 55%, no RWMA, mild plaque in descending aorta, mild MR, mod BAE, normal RVF  . HL (hearing loss)   . HTN (hypertension)   . Hyperlipidemia   . Hyperplastic colon polyp   . Internal hemorrhoids   . Intestinal metaplasia of gastric mucosa   . Obesity   .  OSA (obstructive sleep apnea)    noncompliant with CPAP  . Tubular adenoma of colon      Past Surgical History:  Procedure Laterality Date  . CARDIOVERSION N/A 01/23/2016   Procedure: CARDIOVERSION;  Surgeon: Larey Dresser, MD;  Location: [facility];  Service: Cardiovascular;  Laterality: N/A;  . CESAREAN SECTION    . GANGLION CYST EXCISION Left 1970s  . TEE WITHOUT CARDIOVERSION N/A 01/23/2016   Procedure: TRANSESOPHAGEAL ECHOCARDIOGRAM (TEE);  Surgeon: Larey Dresser, MD;  Location: [facility];  Service: Cardiovascular;  Laterality: N/A;  . TOTAL ABDOMINAL HYSTERECTOMY    .  WRIST FRACTURE SURGERY  2010   wrist fracture repair with metal rod   Family History  Problem Relation Age of Onset  . Cancer Father   . Stomach cancer Father   . Diabetes Other        noninsulin dependant DM  . Hypertension Other   . Stomach cancer Maternal Grandmother   . Colon cancer Neg Hx   . Colon polyps Neg Hx   . Esophageal cancer Neg Hx   . Rectal cancer Neg Hx    Social History   Tobacco Use  . Smoking status: Former Smoker    Quit date: 2010    Years since quitting: 10.6  . Smokeless tobacco: Never Used  . Tobacco comment: 15-pack-year smoker  Substance Use Topics  . Alcohol use: No    Comment: rarely  . Drug use: No   Current Outpatient Medications  Medication Sig Dispense Refill  . clonazePAM (KLONOPIN) 0.5 MG tablet Take 0.5 mg by mouth daily.     . Cyanocobalamin (VITAMIN B 12 PO) Take by mouth.    . diltiazem (TIAZAC) 360 MG 24 hr capsule Take 360 mg by mouth daily.    Marland Kitchen ELIQUIS 5 MG TABS tablet TAKE 1 TABLET BY MOUTH TWICE A DAY 60 tablet 10  . glimepiride (AMARYL) 4 MG tablet Take 4 mg by mouth 2 (two) times daily.  99  . hydrochlorothiazide (HYDRODIURIL) 25 MG tablet Take 25 mg by mouth daily.    Marland Kitchen lisinopril (PRINIVIL,ZESTRIL) 40 MG tablet Take 1 tablet (40 mg total) by mouth daily. (Patient taking differently: Take 40 mg by mouth as directed. ) 30 tablet 11  . metFORMIN (GLUCOPHAGE) 500 MG tablet Take 1,000 mg by mouth 2 (two) times daily with a meal.     . metoprolol tartrate (LOPRESSOR) 50 MG tablet APPT REQUIRED FOR REFILLS [phone] TAKE 0.5 TABLETS (25 MG TOTAL) BY MOUTH 2 (TWO) TIMES DAILY. 30 tablet 0  . omeprazole (PRILOSEC) 20 MG capsule Take 20 mg by mouth daily.     . sitaGLIPtin (JANUVIA) 100 MG tablet Take 100 mg by mouth daily.    . Turmeric ([employer] TURMERIC CURCUMIN) 500 MG CAPS Take by mouth.     No current facility-administered medications for this visit.    Allergies  Allergen Reactions  . Hydrocodone Itching     Review of  Systems: All systems reviewed and negative except where noted in HPI.    No results found.  Physical Exam: BP (!) 142/70 (BP Location: Left Arm, Patient Position: Sitting)   Pulse 73   Temp 98.4 F (36.9 C)   Ht 5\' 6"  (1.676 m)   Wt 233 lb (105.7 kg)   SpO2 93%   BMI 37.61 kg/m  Constitutional: Pleasant,well-developed, female in no acute distress. HEENT: Normocephalic and atraumatic. Conjunctivae are normal. No scleral icterus. Neck supple.  Cardiovascular: Normal rate, regular rhythm.  Pulmonary/chest: Effort normal and breath sounds normal. No wheezing, rales or rhonchi. Abdominal: Soft, nondistended, nontender.. There are no masses palpable. No hepatomegaly. Extremities: no edema Lymphadenopathy: No cervical adenopathy noted. Neurological: Alert and oriented to person place and time. Skin: Skin is warm and dry. No rashes noted. Psychiatric: Normal mood and affect. Behavior is normal.   ASSESSMENT AND PLAN: 68 year old female here for reassessment of the following:  Gastric polyps / Iron deficiency anemia / Anticoagulated - patient with large hyperplastic gastric polyps with intestinal metaplasia on prior EGD thought to be causing anemia at the time.  She is due for surveillance EGD to reassess her stomach for given prior pathology results.  I discussed risks and benefits of EGD and anesthesia and she wanted to proceed.  While we could do a diagnostic exam on the Eliquis, given her history of gastric polyps, will hold Eliquis 2 days prior to the EGD in case she has recurrent polyps that warrant removal.  We discussed the risks of that, she understood.  Further recommendations pending results, this procedure will be booked with Dr. Hilarie Fredrickson her primary gastroenterologist. I will request records of prior labs from her PCP to ensure anemia has resolved. Consideration for capsule endoscopy if anemia persists and EGD unremarkable.  History of colon polyps - due for surveillance  colonoscopy 05/2020  Gallstone - patient has a 5.2 cm gallstone on prior ultrasound and appears to have had this dating back to CT imaging remotely.  I do not think the gallstone is causing symptoms, she does not have typical biliary colic, however she is at increased risk for gallbladder malignancy with a gallstone greater than 3 cm.  As such I will refer her to general surgery to discuss cholecystectomy given the size of the gallstone.  She agreed.  GERD / Barrett's esophagus - symptoms controlled with current dosing of omeprazole, will await results of EGD as above. Continue omeprazole.   Fatty liver -counseled the patient on this, ultimately weight loss would be beneficial.  Will request labs from her primary care physician to review most recent LFTs .  She agreed  Lakehead Cellar, MD [facility]

## 2019-07-12 NOTE — Telephone Encounter (Signed)
Request for surgical clearance:     Endoscopy Procedure  What type of surgery is being performed?     endoscopy  When is this surgery scheduled?     07/20/2019  What type of clearance is required ?   Pharmacy  Are there any medications that need to be held prior to surgery and how long? Eliquis, 2 days  Practice name and name of physician performing surgery?      [facility]  What is your office phone and fax number?      Phone- [phone]  [phone]  Anesthesia type (None, local, MAC, general) ?       MAC

## 2019-07-12 NOTE — Patient Instructions (Signed)
You have been scheduled for an endoscopy. Please follow written instructions given to you at your visit today. If you use inhalers (even only as needed), please bring them with you on the day of your procedure. Your physician has requested that you go to www.startemmi.com and enter the access code given to you at your visit today. This web site gives a general overview about your procedure. However, you should still follow specific instructions given to you by our office regarding your preparation for the procedure.  You have been scheduled for an appointment with _____________ at [facility]. Your appointment is on _______________ at ____________. Please arrive at _______________ for registration. Make certain to bring a list of current medications, including any over the counter medications or vitamins. Also bring your co-pay if you have one as well as your insurance cards. [facility] is located at [address]. Should you need to reschedule your appointment, please contact them at [phone].  If you are age 5 or older, your body mass index should be between 23-30. Your Body mass index is 37.61 kg/m. If this is out of the aforementioned range listed, please consider follow up with your Primary Care Provider.  If you are age 4 or younger, your body mass index should be between 19-25. Your Body mass index is 37.61 kg/m. If this is out of the aformentioned range listed, please consider follow up with your Primary Care Provider.

## 2019-07-12 NOTE — Telephone Encounter (Signed)
Pt takes Eliquis for afib with CHADS2VASc score of 5 (age, sex, HTN, DM, CAD). Renal function is normal. Ok to hold Eliquis for 2 days prior to endoscopy as requested.

## 2019-07-12 NOTE — Telephone Encounter (Signed)
Will route to Pharmacy for assistance with Eliquis peri-operatively.

## 2019-07-13 NOTE — Telephone Encounter (Signed)
I have spoken to patient to advise that per cardiology, she may hold Eliquis 2 days prior to endoscopy. She verbalizes understanding of this. In addition, her 07/20/19 endoscopy has been changed to 08/16/19 at 11 am as Dr Hilarie Fredrickson will no longer be available on 07/20/19. She also verbalizes understanding of this as well as updated prep instructions.

## 2019-07-14 ENCOUNTER — Telehealth: Payer: Self-pay | Admitting: Internal Medicine

## 2019-07-15 ENCOUNTER — Telehealth: Payer: Self-pay | Admitting: Gastroenterology

## 2019-07-15 DIAGNOSIS — D509 Iron deficiency anemia, unspecified: Secondary | ICD-10-CM

## 2019-07-15 NOTE — Telephone Encounter (Signed)
Left message for patient to call back. Labs entered in epic.

## 2019-07-15 NOTE — Telephone Encounter (Signed)
Labs reviewed, arrived from PCP:  04/11/2019 -  Hgb 11.3, MCV 73, WBC 14.2, plt 389  She is not on iron supplementation.  [name] can you help order this patient a CBC and ferritin / TIBC panel to see where this is trending? I suspect she will need oral iron supplementation but will await her labs. Thanks. She is a [facility] patient.

## 2019-07-19 ENCOUNTER — Ambulatory Visit: Payer: 59

## 2019-07-19 NOTE — Telephone Encounter (Signed)
Left message for patient to call back  

## 2019-07-20 ENCOUNTER — Encounter: Payer: 59 | Admitting: Internal Medicine

## 2019-07-21 NOTE — Telephone Encounter (Signed)
I have spoken to patient to advise that we have received labs from PCP but we need to get some additional iron studies on her. She agrees to come for labs. In addition, she notes that she did hear from [facility] and has an upcoming appointment in October to discuss cholecystectomy due to her 5.2 cm gallstone seen on 04/2018 ultrasound.

## 2019-07-26 ENCOUNTER — Other Ambulatory Visit (INDEPENDENT_AMBULATORY_CARE_PROVIDER_SITE_OTHER): Payer: 59

## 2019-07-26 DIAGNOSIS — D509 Iron deficiency anemia, unspecified: Secondary | ICD-10-CM | POA: Diagnosis not present

## 2019-07-26 LAB — CBC WITH DIFFERENTIAL/PLATELET
Basophils Absolute: 0.1 10*3/uL (ref 0.0–0.1)
Basophils Relative: 0.9 % (ref 0.0–3.0)
Eosinophils Absolute: 0.8 10*3/uL — ABNORMAL HIGH (ref 0.0–0.7)
Eosinophils Relative: 8.2 % — ABNORMAL HIGH (ref 0.0–5.0)
HCT: 33.8 % — ABNORMAL LOW (ref 36.0–46.0)
Hemoglobin: 10.6 g/dL — ABNORMAL LOW (ref 12.0–15.0)
Lymphocytes Relative: 31.6 % (ref 12.0–46.0)
Lymphs Abs: 3 10*3/uL (ref 0.7–4.0)
MCHC: 31.5 g/dL (ref 30.0–36.0)
MCV: 71.2 fl — ABNORMAL LOW (ref 78.0–100.0)
Monocytes Absolute: 0.7 10*3/uL (ref 0.1–1.0)
Monocytes Relative: 7 % (ref 3.0–12.0)
Neutro Abs: 5 10*3/uL (ref 1.4–7.7)
Neutrophils Relative %: 52.3 % (ref 43.0–77.0)
Platelets: 298 10*3/uL (ref 150.0–400.0)
RBC: 4.74 Mil/uL (ref 3.87–5.11)
RDW: 16.8 % — ABNORMAL HIGH (ref 11.5–15.5)
WBC: 9.6 10*3/uL (ref 4.0–10.5)

## 2019-07-26 LAB — IBC + FERRITIN
Ferritin: 7.7 ng/mL — ABNORMAL LOW (ref 10.0–291.0)
Iron: 27 ug/dL — ABNORMAL LOW (ref 42–145)
Saturation Ratios: 5.1 % — ABNORMAL LOW (ref 20.0–50.0)
Transferrin: 375 mg/dL — ABNORMAL HIGH (ref 212.0–360.0)

## 2019-07-28 ENCOUNTER — Telehealth: Payer: Self-pay | Admitting: Internal Medicine

## 2019-07-28 NOTE — Telephone Encounter (Signed)
See lab note from 07/26/2019.

## 2019-08-01 ENCOUNTER — Telehealth: Payer: Self-pay | Admitting: *Deleted

## 2019-08-01 ENCOUNTER — Ambulatory Visit: Payer: Self-pay | Admitting: Surgery

## 2019-08-01 DIAGNOSIS — Z7901 Long term (current) use of anticoagulants: Secondary | ICD-10-CM

## 2019-08-01 DIAGNOSIS — K760 Fatty (change of) liver, not elsewhere classified: Secondary | ICD-10-CM | POA: Insufficient documentation

## 2019-08-01 DIAGNOSIS — Z79899 Other long term (current) drug therapy: Secondary | ICD-10-CM | POA: Insufficient documentation

## 2019-08-01 DIAGNOSIS — E669 Obesity, unspecified: Secondary | ICD-10-CM | POA: Insufficient documentation

## 2019-08-01 DIAGNOSIS — K8020 Calculus of gallbladder without cholecystitis without obstruction: Secondary | ICD-10-CM | POA: Insufficient documentation

## 2019-08-01 NOTE — H&P (Signed)
Kayla Hendrix Documented: 08/01/2019 9:51 AM Location: [facility] Patient #: [id number] DOB: [birth year] Single / Language: Cleophus Molt / Race: White Female  History of Present Illness Kayla Hector MD; 08/01/2019 12:58 PM) The patient is a 68 year old female who presents for evaluation of gall stones. Note for "Gall stones": ` ` ` Patient sent for surgical consultation at the request of Dr. [facility]  Chief Complaint: Giant gallstone with elevated liver function tests. ` ` The patient is a pleasant obese woman. She's had some vague upper abdominal complaints. Issue of Barrett's esophagus. I believe some gastritis. Followed by [facility] gastroenterology y. Was found to have some increased liver function tests. An ultrasound which showed some gallbladder wall thickening and 5 cm gallstone. Given larger size, surgical consultation requested. Patient has limited mobility due to severe low back pain and spinal stenosis and sciatica. He occasionally uses a scooter to get around. Usually walk short/moderate distances okay. Her bowels once a day. Eats most foods without too much difficulty. Some upper abdominal left-sided discomfort from time to time. Occasionally she will get some postprandial diarrhea. She does have atrial fibrillation and is chronically anticoagulated Eliquis. Followed by Dr. Rayann Heman with [facility]. Diabetes and oral hypoglycemics. Had a hysterectomy in the past. Denies much some heartburn and reflux that is on omeprazole and history of Barrett's esophagus  No personal nor family history of GI/colon cancer, inflammatory bowel disease, irritable bowel syndrome, allergy such as Celiac Sprue, dietary/dairy problems, colitis, ulcers nor gastritis. No recent sick contacts/gastroenteritis. No travel outside the country. No changes in diet. No dysphagia to solids or liquids. No hematochezia, hematemesis, coffee ground emesis.    (Review of systems as stated in this history (HPI) or in the review of systems. Otherwise all other 12 point ROS are negative) ` ` `   Allergies (Sabrina Canty, CMA; 08/01/2019 9:52 AM) HYDROcodone-Acetaminophen *ANALGESICS - OPIOID* Allergies Reconciled  Medication History (Sabrina Canty, CMA; 08/01/2019 9:53 AM) clonazePAM (0.5MG  Tablet, Oral) Active. Eliquis (5MG  Tablet, Oral) Active. Gabapentin (300MG  Capsule, Oral) Active. Glimepiride (4MG  Tablet, Oral) Active. hydroCHLOROthiazide (25MG  Tablet, Oral) Active. Januvia (100MG  Tablet, Oral) Active. Omeprazole (20MG  Capsule DR, Oral) Active. Metoprolol Tartrate (50MG  Tablet, Oral) Active. metFORMIN HCl (500MG  Tablet, Oral) Active. Tiadylt ER (360MG  Capsule ER 24HR, Oral) Active. Medications Reconciled    Vitals (Sabrina Canty CMA; 08/01/2019 9:54 AM) 08/01/2019 9:53 AM Weight: 234.8 lb Height: 66in Body Surface Area: 2.14 m Body Mass Index: 37.9 kg/m  Temp.: 96.62F(Temporal)  Pulse: 95 (Regular)  BP: 156/78 (Sitting, Left Arm, Standard)        Physical Exam Kayla Hector MD; 08/01/2019 12:59 PM)  General Mental Status-Alert. General Appearance-Not in acute distress, Not Sickly. Orientation-Oriented X3. Hydration-Well hydrated. Voice-Normal.  Integumentary Global Assessment Upon inspection and palpation of skin surfaces of the - Axillae: non-tender, no inflammation or ulceration, no drainage. and Distribution of scalp and body hair is normal. General Characteristics Temperature - normal warmth is noted.  Head and Neck Head-normocephalic, atraumatic with no lesions or palpable masses. Face Global Assessment - atraumatic, no absence of expression. Neck Global Assessment - no abnormal movements, no bruit auscultated on the right, no bruit auscultated on the left, no decreased range of motion, non-tender. Trachea-midline. Thyroid Gland Characteristics - non-tender.   Eye Eyeball - Left-Extraocular movements intact, No Nystagmus - Left. Eyeball - Right-Extraocular movements intact, No Nystagmus - Right. Cornea - Left-No Hazy - Left. Cornea - Right-No Hazy - Right. Sclera/Conjunctiva - Left-No  scleral icterus, No Discharge - Left. Sclera/Conjunctiva - Right-No scleral icterus, No Discharge - Right. Pupil - Left-Direct reaction to light normal. Pupil - Right-Direct reaction to light normal.  ENMT Ears Pinna - Left - no drainage observed, no generalized tenderness observed. Pinna - Right - no drainage observed, no generalized tenderness observed. Nose and Sinuses External Inspection of the Nose - no destructive lesion observed. Inspection of the nares - Left - quiet respiration. Inspection of the nares - Right - quiet respiration. Mouth and Throat Lips - Upper Lip - no fissures observed, no pallor noted. Lower Lip - no fissures observed, no pallor noted. Nasopharynx - no discharge present. Oral Cavity/Oropharynx - Tongue - no dryness observed. Oral Mucosa - no cyanosis observed. Hypopharynx - no evidence of airway distress observed.  Chest and Lung Exam Inspection Movements - Normal and Symmetrical. Accessory muscles - No use of accessory muscles in breathing. Palpation Palpation of the chest reveals - Non-tender. Auscultation Breath sounds - Normal and Clear.  Cardiovascular Auscultation Rhythm - Regular. Murmurs & Other Heart Sounds - Auscultation of the heart reveals - No Murmurs and No Systolic Clicks.  Abdomen Inspection Inspection of the abdomen reveals - No Visible peristalsis and No Abnormal pulsations. Umbilicus - No Bleeding, No Urine drainage. Palpation/Percussion Palpation and Percussion of the abdomen reveal - Soft, Non Tender, No Rebound tenderness, No Rigidity (guarding) and No Cutaneous hyperesthesia. Note: Abdomen obese but soft. Not severely distended. Some thinning supraumbilically consistent with diastasis  recti. No umbilical or other anterior abdominal wall hernias  Female Genitourinary Sexual Maturity Tanner 5 - Adult hair pattern. Note: No vaginal bleeding nor discharge  Peripheral Vascular Upper Extremity Inspection - Left - No Cyanotic nailbeds - Left, Not Ischemic. Inspection - Right - No Cyanotic nailbeds - Right, Not Ischemic.  Neurologic Neurologic evaluation reveals -normal attention span and ability to concentrate, able to name objects and repeat phrases. Appropriate fund of knowledge , normal sensation and normal coordination. Mental Status Affect - not angry, not paranoid. Cranial Nerves-Normal Bilaterally. Gait-Normal.  Neuropsychiatric Mental status exam performed with findings of-able to articulate well with normal speech/language, rate, volume and coherence, thought content normal with ability to perform basic computations and apply abstract reasoning and no evidence of hallucinations, delusions, obsessions or homicidal/suicidal ideation.  Musculoskeletal Global Assessment Spine, Ribs and Pelvis - no instability, subluxation or laxity. Right Upper Extremity - no instability, subluxation or laxity.  Lymphatic Head & Neck  General Head & Neck Lymphatics: Bilateral - Description - No Localized lymphadenopathy. Axillary  General Axillary Region: Bilateral - Description - No Localized lymphadenopathy. Femoral & Inguinal  Generalized Femoral & Inguinal Lymphatics: Left - Description - No Localized lymphadenopathy. Right - Description - No Localized lymphadenopathy.    Assessment & Plan Kayla Hector MD; 08/01/2019 12:42 PM)  GALLSTONE (IMPACTED) (K80.20) Impression: 5 cm gallstone with some gallbladder wall thickening. Suspicious for chronic cholecystitis. Assessment General surgery colleagues, Dr. Georgette Dover & Dr. Brantley Stage. There is some data about the increased risk of gallbladder cancer for gallstones greater than 3 cm. He does have some vague upper  abdominal complaints. I would lean towards recommending cholecystectomy. She is leaning towards it as well. With her obesity and large stone, not a great single site candidate. Would plan standard four-port cholecystectomy and removal of the gallstone in the epigastric region to lower hernia risks  We'll get cardiac clearance. Sounds like she has some atrial fibrillation but is not otherwise having any major cardiac issues. Mobility is limited somewhat  from her chronic spinal stenosis and low back pain was there is some stronger concerns from cardiology, I think she would benefit from cholecystectomy.  Complete gastroenterology workup. Per [facility]   FATTY LIVER (K76.0) Impression: Try and get records of these elevated liver function tests. Not in the Epic system. Most likely would do core liver biopsy was not at the time of surgery to make sure there are no other issues beyond just fatty change   CHRONIC CHOLECYSTITIS WITH CALCULUS (K80.10)  Current Plans You are being scheduled for surgery- Our schedulers will call you.  You should hear from our office's scheduling department within 5 working days about the location, date, and time of surgery. We try to make accommodations for patient's preferences in scheduling surgery, but sometimes the OR schedule or the surgeon's schedule prevents [place] from making those accommodations.  If you have not heard from our office [phone]) in 5 working days, call the office and ask for your surgeon's nurse.  If you have other questions about your diagnosis, plan, or surgery, call the office and ask for your surgeon's nurse.  Written instructions provided Pt Education - Pamphlet Given - Laparoscopic Gallbladder Surgery: discussed with patient and provided information. The anatomy & physiology of hepatobiliary & pancreatic function was discussed. The pathophysiology of gallbladder dysfunction was discussed. Natural history risks without surgery was  discussed. I feel the risks of no intervention will lead to serious problems that outweigh the operative risks; therefore, I recommended cholecystectomy to remove the pathology. I explained laparoscopic techniques with possible need for an open approach. Probable cholangiogram to evaluate the bilary tract was explained as well.  Risks such as bleeding, infection, abscess, leak, injury to other organs, need for further treatment, heart attack, death, and other risks were discussed. I noted a good likelihood this will help address the problem. Possibility that this will not correct all abdominal symptoms was explained. Goals of post-operative recovery were discussed as well. We will work to minimize complications. An educational handout further explaining the pathology and treatment options was given as well. Questions were answered. The patient expresses understanding & wishes to proceed with surgery.  Pt Education - [facility] Laparosopic Post Op HCI (Alaysha Jefcoat) Pt Education - [facility] Good Bowel Health (Janiel Derhammer) Pt Education - Laparoscopic Cholecystectomy: gallbladder I recommended obtaining preoperative cardiac clearance for recommendations on management of anticoagualtion perioperatively:  1. Timing of holding anticoagulation 2. Need for any bridge therapy (SQ enoxaparin, IV heparin, IV Aggrastat, etc) preop/postop. 3. Desired timing of resumption of anticoagulation.  In general from Dr. Johney Maine' standpoint:  Aspirin is okay to continue perioperatively (81mg  or 325mg ) & does not need to be held  Hold warfarin 5 dayspreoperatively. Consider PT/INR level on arrival to short stay the day of surgery. No need to check PT/INR level on preop visit  Hold P2Y12 inibitors such as clopidrogel (Plavix) 4 dayspreoperatively  Hold direct thrombin / factor Xa inhibitors (Xerelto, Pradaxa, Eliquis, etc) 2 dayspreoperatively   Request clearance by cardiology to better assess operative risk &  see if a reevaluation, further workup, etc is needed. Also recommendations on how medications such as for anticoagulation and blood pressure should be managed/held/restarted after surgery.  Kayla Hector, MD, FACS, MASCRS Gastrointestinal and Minimally Invasive Surgery    [address], [place], [place] [address] [phone] Main / Paging [phone] Fax

## 2019-08-01 NOTE — Telephone Encounter (Signed)
   [facility] Pre-operative Risk Assessment    Request for surgical clearance:  1. What type of surgery is being performed? LAP CHOLE   2. When is this surgery scheduled? TBD   3. What type of clearance is required (medical clearance vs. Pharmacy clearance to hold med vs. Both)? BOTH  4. Are there any medications that need to be held prior to surgery and how long? ELIQUIS   5. Practice name and name of physician performing surgery? [facility]; DR. Remo Lipps GROSS  6. What is your office phone number [phone]    7.   What is your office fax number [phone]  8.   Anesthesia type (None, local, MAC, general) ? GENERAL   Kayla Hendrix 08/01/2019, 4:39 PM  _________________________________________________________________   (provider comments below)

## 2019-08-02 NOTE — Telephone Encounter (Signed)
Patient with diagnosis of afib on Eliquis for anticoagulation.    Procedure: LAP CHOLE  Date of procedure: TBD  CHADS2-VASc score of  5 (HTN, AGE, DM2, CAD, female)  CrCl 62 ml/min  Per office protocol, patient can hold Eliquis for 2 days prior to procedure.

## 2019-08-03 ENCOUNTER — Telehealth: Payer: Self-pay

## 2019-08-03 NOTE — Telephone Encounter (Signed)
Patient was made aware that she will receive a call 15-20 minutes before her call with Tommye Standard, PA. Patient was informed to get her vitals signs and to have her medication bottles and/or list of medications. She was also informed that during that call she will be given instructions on how to access video if she chooses to do a video call with Renee. Patient verbalized an understanding and all (if any) questions were answered.

## 2019-08-03 NOTE — Telephone Encounter (Signed)
   Primary Cardiologist: Dr. Rayann Heman  Chart reviewed as part of pre-operative protocol coverage. Because of Kayla Hendrix's past medical history and time since last visit, he/she will require a follow-up visit in order to better assess preoperative cardiovascular risk.  Pre-op covering staff: - Please schedule appointment and call patient to inform them. - Please contact requesting surgeon's office via preferred method (i.e, phone, fax) to inform them of need for appointment prior to surgery.  If applicable, this message will also be routed to pharmacy pool and/or primary cardiologist for input on holding anticoagulant/antiplatelet agent as requested below so that this information is available at time of patient's appointment.   Kayla Hendrix, [place]  08/03/2019, 9:10 AM

## 2019-08-03 NOTE — Telephone Encounter (Signed)

## 2019-08-03 NOTE — Telephone Encounter (Signed)
Patient is scheduled for a virtual visit with Tommye Standard, PA-C on 08/17/2019 at [facility]. Patient was given instructions on what to do the morning of her virtual visit  About is she is able to get her  Vitals (BP, HR) and medication bottles or list ready. Patient was also read the Telehealth Consent. Patient verbalized an understanding and all (if any) questions were answered.

## 2019-08-09 NOTE — Telephone Encounter (Signed)
Per [facility] patient is scheduled for appointment 08/31/19 with Dr Johney Maine.

## 2019-08-15 ENCOUNTER — Telehealth: Payer: Self-pay | Admitting: Internal Medicine

## 2019-08-15 NOTE — Telephone Encounter (Signed)
Pt reported that she has "problems with her bowels" and requested a call back.  She is scheduled for EGD tomorrow.

## 2019-08-15 NOTE — Telephone Encounter (Signed)
Pt called stating she was having "mucous in her stools" and was wondering if this would affect her EGD tomorrow. She is not running a fever nor having any other symptoms. I told her to come in for her EGD as planned and she could discuss this with Dr. Hilarie Fredrickson tomorrow. Sm

## 2019-08-16 ENCOUNTER — Ambulatory Visit (AMBULATORY_SURGERY_CENTER): Payer: 59 | Admitting: Internal Medicine

## 2019-08-16 ENCOUNTER — Encounter: Payer: Self-pay | Admitting: Internal Medicine

## 2019-08-16 ENCOUNTER — Other Ambulatory Visit: Payer: Self-pay

## 2019-08-16 ENCOUNTER — Encounter: Payer: 59 | Admitting: Internal Medicine

## 2019-08-16 VITALS — BP 121/43 | HR 65 | Temp 98.2°F | Resp 14 | Ht 66.0 in | Wt 233.0 lb

## 2019-08-16 DIAGNOSIS — K317 Polyp of stomach and duodenum: Secondary | ICD-10-CM

## 2019-08-16 DIAGNOSIS — D3A092 Benign carcinoid tumor of the stomach: Secondary | ICD-10-CM | POA: Diagnosis not present

## 2019-08-16 DIAGNOSIS — D3A8 Other benign neuroendocrine tumors: Secondary | ICD-10-CM | POA: Diagnosis not present

## 2019-08-16 DIAGNOSIS — K2270 Barrett's esophagus without dysplasia: Secondary | ICD-10-CM | POA: Diagnosis not present

## 2019-08-16 MED ORDER — SODIUM CHLORIDE 0.9 % IV SOLN: 500.0000 mL | Freq: Once | INTRAVENOUS | Status: DC

## 2019-08-16 NOTE — Progress Notes (Deleted)
Cardiology Office Note Date:  08/16/2019  Patient ID:  Kayla, Hendrix 1951/03/30, MRN [id number] PCP:  Levin Erp, MD  Electrophysiologist:  Dr. Rayann Heman    Chief Complaint: *** surgical clearance ???  Lap-chole  History of Present Illness: Kayla Hendrix is a 68 y.o. female with history of OSA intolerant of CPAP, HTN, HLD, DM, persistent AFib.   She last saw Dr. Rayann Heman, last seen by him in April 2017 she was s/p DCCV maintaining SR, discussed life style modifications and AF triggers.  She was not taking the Eliquis 2/2 cost and given copay card, mentioning she had been previously intolerant of xarelto.  I saw her in Sept 2018, she denied any kind of CP, she did have a chronic component of DOE,  She worried may have heart failure after looking up the symptom.  She had no rest SOB, denies symptoms of PND or orthopnea.  She c/o always being tired, never felt like she got enough sleep known to have OSA though unable to tolerate CPAP, and was pending a consult with Dr. Ron Parker to see about an oral device.  She admitted she can sit for a few minutes and drift to sleep.  No dizziness, near syncope or syncope.  She denied palpitations, reported her AF symptoms is more of an unusual physical fatigue and had not had that.  She denied any bleeding or signs of bleeding.  She had labs in may by her PMD noted her blood counts were down some and had EGD/colonoscopy without evidence of bleeding reported to her, f/u labs she reports showed her counts better, and was never recommended to stop her Eliquis.  She reported she has been having trouble with BS control, her PMD is following her closely.  Planned for updated echo, noted normal LVEF, no DD or VHD.  I saw her again 01/2018 with concerns of a day 2 weeks prio that she felt unusually tired, thought she might be out of rhythm, found her BP unusually low [id number] systolic.  She went home from work rested and by the evening felt better.  She called the office  and was recommended to take 1/2 tab of her lopressor BID and has since then.  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   That day this seemed to be bothering her more then usual.  None of her symptoms were ongoing or had been recurrent.  She mentioned she was often working 7d/week and this keeps her always tired.  She still had not been able to pursue the oral device for her sleep apnea.  No CP, palpitations, no SOB.  She remained at her same exertional capacity.  No exercise.  She denies any bleeding or signs of bleeding, reports compliance with her Eliquis. No changes were made, she was made f/u with BP clinic with her concerns of low BP, though some concerns of  her home cuff accuracy.  Her cuff was found accurate, her BP generally elevated and her medicines were adjusted She saw AFib clinic Dec 2019, in Afib about a week and planned for DCCV though had spontaneous conversion prior. Her last visit in Dec 2019 with the AFib clinic discussed AAD options, the patient wanted to give some thought on this, though a phone note mentions she preferred titration of her BB as 1st strategy, no f/u there since.   *** lap chole?? *** EGD 10/6 ?? *** symptoms ***  AFib *** meds *** BP *** apnea dental appliance??    Past Medical History:  Diagnosis Date  . Allergy   . Anemia   . Anxiety   . Atrial fibrillation (HCC)    paroxysmal only asa 81 mg, no other blood thinner, the xarelto she was on gave her HAs  . Barrett's esophagus 2018  . CAD (coronary artery disease)    cath 2006- nonobstructive CAD  . Cataract   . Depression   . Diabetes mellitus without complication ([place])   . Gastric polyp   . GERD (gastroesophageal reflux disease)   . Glucose intolerance (impaired glucose tolerance)   . Hiatal hernia   . History of transesophageal echocardiography (TEE) for monitoring    TEE 3/17:  EF 55%, no RWMA, mild plaque in descending  aorta, mild MR, mod BAE, normal RVF  . HL (hearing loss)   . HTN (hypertension)   . Hyperlipidemia   . Hyperplastic colon polyp   . Internal hemorrhoids   . Intestinal metaplasia of gastric mucosa   . Obesity   . OSA (obstructive sleep apnea)    noncompliant with CPAP  . Tubular adenoma of colon     Past Surgical History:  Procedure Laterality Date  . CARDIOVERSION N/A 01/23/2016   Procedure: CARDIOVERSION;  Surgeon: Larey Dresser, MD;  Location: [facility];  Service: Cardiovascular;  Laterality: N/A;  . CESAREAN SECTION    . GANGLION CYST EXCISION Left 1970s  . TEE WITHOUT CARDIOVERSION N/A 01/23/2016   Procedure: TRANSESOPHAGEAL ECHOCARDIOGRAM (TEE);  Surgeon: Larey Dresser, MD;  Location: [facility];  Service: Cardiovascular;  Laterality: N/A;  . TOTAL ABDOMINAL HYSTERECTOMY    . WRIST FRACTURE SURGERY  2010   wrist fracture repair with metal rod    Current Outpatient Medications  Medication Sig Dispense Refill  . clonazePAM (KLONOPIN) 0.5 MG tablet Take 0.5 mg by mouth daily.     . Cyanocobalamin (VITAMIN B 12 PO) Take by mouth.    . diltiazem (TIAZAC) 360 MG 24 hr capsule Take 360 mg by mouth daily.    Marland Kitchen ELIQUIS 5 MG TABS tablet TAKE 1 TABLET BY MOUTH TWICE A DAY 60 tablet 10  . glimepiride (AMARYL) 4 MG tablet Take 4 mg by mouth 2 (two) times daily.  99  . hydrochlorothiazide (HYDRODIURIL) 25 MG tablet Take 25 mg by mouth daily.    Marland Kitchen lisinopril (PRINIVIL,ZESTRIL) 40 MG tablet Take 1 tablet (40 mg total) by mouth daily. (Patient taking differently: Take 40 mg by mouth as directed. ) 30 tablet 11  . metFORMIN (GLUCOPHAGE) 500 MG tablet Take 1,000 mg by mouth 2 (two) times daily with a meal.     . metoprolol tartrate (LOPRESSOR) 50 MG tablet APPT REQUIRED FOR REFILLS [phone] TAKE 0.5 TABLETS (25 MG TOTAL) BY MOUTH 2 (TWO) TIMES DAILY. 30 tablet 0  . omeprazole (PRILOSEC) 20 MG capsule Take 20 mg by mouth daily.     . sitaGLIPtin (JANUVIA) 100 MG tablet Take 100 mg  by mouth daily.    . Turmeric ([employer] TURMERIC CURCUMIN) 500 MG CAPS Take by mouth.     No current facility-administered medications for this visit.     Allergies:   Hydrocodone   Social History:  The patient  reports that she quit smoking about 10 years ago. She has never used smokeless tobacco. She reports that she does not drink alcohol or use drugs.   Family History:  The patient's family history includes Cancer in her father; Diabetes  in an other family member; Hypertension in an other family member; Stomach cancer in her father and maternal grandmother.  ROS:  Please see the history of present illness.    All other systems are reviewed and otherwise negative.   PHYSICAL EXAM:  VS:  There were no vitals taken for this visit. BMI: There is no height or weight on file to calculate BMI. Well nourished, well developed, in no acute distress   ****  HEENT: normocephalic, atraumatic  Neck: no JVD, carotid bruits or masses Cardiac:  RRR; soft SM, no rubs, or gallops Lungs:  CTA b/l, no wheezing, rhonchi or rales  Abd: soft, nontender, obese MS: no deformity or atrophy Ext: no edema  Skin: warm and dry, no rash Neuro:  No gross deficits appreciated Psych: euthymic mood, full affect   EKG:  Not done today  08/10/17: TTE Study Conclusions - Left ventricle: The cavity size was normal. Wall thickness was   increased in a pattern of mild LVH. Systolic function was   vigorous. The estimated ejection fraction was in the range of 65%   to 70%. Left ventricular diastolic function parameters were   normal.  07/13/09: TTE Study Conclusions 1. Left atrium: No evidence of thrombus in the atrial cavity or  appendage. 2. Atrial septum: No defect or patent foramen ovale was identified. Transesophageal echocardiography. 2D and color Doppler.  Normal LVEF   Recent Labs: 10/11/2018: BUN 13; Creatinine, Ser 1.07; Potassium 4.1; Sodium 136 07/26/2019: Hemoglobin 10.6; Platelets 298.0  No  results found for requested labs within last 8760 hours.   CrCl cannot be calculated (Patient's most recent lab result is older than the maximum 21 days allowed.).   Wt Readings from Last 3 Encounters:  07/12/19 233 lb (105.7 kg)  10/27/18 234 lb (106.1 kg)  10/11/18 232 lb (105.2 kg)     Other studies reviewed: Additional studies/records reviewed today include: summarized above  ASSESSMENT AND PLAN:  1. Persistent AFib     CHA2DS2Vasc is at least 4, on *** Eliquis, appropriately dosed       2. HTN     ***  3. Untreated sleep apnea     *** Counseled she is in the process of further evaluatin to treatment options     Has not yet been able to pursue oral device  4. DOE    ***  is chronic, not escalating/changing in years     *** Likely is multifactorial, with obesity, sedentary lifestyle      5. Obesity     *** Re-discussed exercise strategies given chronic back issues, diet   Disposition: ***    Current medicines are reviewed at length with the patient today.  The patient did not have any concerns regarding medicines.  Venetia Night, PA-C 08/16/2019 7:20 AM     [address] [place] [address] [phone] (office)  [phone] (fax)

## 2019-08-16 NOTE — Op Note (Signed)
[facility] Patient Name: Kayla Hendrix Procedure Date: 08/16/2019 10:42 AM MRN: [id number] Endoscopist: Jerene Bears , MD Age: 68 Referring MD:  Date of Birth: Aug 03, 1951 Gender: Female Account #: [id number] Procedure:                Upper GI endoscopy Indications:              Follow-up of gastric polyps (hyperplastic polyps                            with intestinal metaplasia removed in 2018), iron                            deficiency anemia Medicines:                Monitored Anesthesia Care Procedure:                Pre-Anesthesia Assessment:                           - Prior to the procedure, a History and Physical                            was performed, and patient medications and                            allergies were reviewed. The patient's tolerance of                            previous anesthesia was also reviewed. The risks                            and benefits of the procedure and the sedation                            options and risks were discussed with the patient.                            All questions were answered, and informed consent                            was obtained. Prior Anticoagulants: The patient has                            taken Eliquis (apixaban), last dose was 2 days                            prior to procedure. ASA Grade Assessment: III - A                            patient with severe systemic disease. After                            reviewing the risks and benefits, the patient was  deemed in satisfactory condition to undergo the                            procedure.                           After obtaining informed consent, the endoscope was                            passed under direct vision. Throughout the                            procedure, the patient's blood pressure, pulse, and                            oxygen saturations were monitored continuously. The     Endoscope was introduced through the mouth, and                            advanced to the second part of duodenum. The upper                            GI endoscopy was accomplished without difficulty.                            The patient tolerated the procedure well. Scope In: Scope Out: Findings:                 The esophagus and gastroesophageal junction were                            examined with white light and narrow band imaging                            (NBI) from a forward view and retroflexed position.                            There were esophageal mucosal changes consistent                            with short-segment Barrett's esophagus. These                            changes involved the mucosa at the upper extent of                            the gastric folds (37 cm from the incisors)                            extending to the Z-line (35 cm from the incisors).                            Squamous islands were present from 35 to 37 cm. The  maximum longitudinal extent of these esophageal                            mucosal changes was 2 cm in length. Mucosa was                            biopsied with a cold forceps for histology in a                            targeted manner at intervals of 1 cm from 35 to 37                            cm from the incisors. One specimen bottle was sent                            to pathology.                           Multiple 2 to 9 mm sessile and semi-sessile polyps                            with no bleeding and no stigmata of recent bleeding                            were found in the gastric fundus and in the gastric                            body. Biopsies were taken with a cold forceps for                            histology.                           A single 8 mm semi-sessile polyp with active oozing                            as found in the gastric body. The polyp was removed                             with a hot snare. Resection and retrieval were                            complete.                           The examined duodenum was normal. Complications:            No immediate complications. Estimated Blood Loss:     Estimated blood loss was minimal. Impression:               - Esophageal mucosal changes consistent with                            short-segment Barrett's esophagus. Biopsied.                           -  Multiple gastric polyps. Biopsied.                           - A single gastric polyp with bleeding. Resected                            and retrieved.                           - Normal examined duodenum. Recommendation:           - Patient has a contact number available for                            emergencies. The signs and symptoms of potential                            delayed complications were discussed with the                            patient. Return to normal activities tomorrow.                            Written discharge instructions were provided to the                            patient.                           - Resume previous diet.                           - Continue present medications.                           - Await pathology results.                           - Resume Eliquis (apixaban) at prior dose in 2                            days. Refer to managing physician for further                            adjustment of therapy.                           - Resume oral iron in 1 week.                           - Repeat CBC, iron studies in 8 weeks.                           - Please let me know if loose stools/diarrhea                            continues (started within last several  days on EGD). Jerene Bears, MD 08/16/2019 11:31:22 AM This report has been signed electronically.

## 2019-08-16 NOTE — Progress Notes (Signed)
Called to room to assist during endoscopic procedure.  Patient ID and intended procedure confirmed with present staff. Received instructions for my participation in the procedure from the performing physician.  

## 2019-08-16 NOTE — Progress Notes (Signed)
Pt's states no medical or surgical changes since previsit or office visit.  JB - temp CW - vitals. 

## 2019-08-16 NOTE — Patient Instructions (Signed)
Please read handouts provided. Continue present medications. Resume Eliquis ( apixaban )  at prior dose in 2 days. Resume oral iron in 1 week. Repeat CBC, iron studies in 8 weeks. Await pathology results. Please let Dr. Hilarie Fredrickson know if loose stools/diarrhea continues.         YOU HAD AN ENDOSCOPIC PROCEDURE TODAY AT [facility]:   Refer to the procedure report that was given to you for any specific questions about what was found during the examination.  If the procedure report does not answer your questions, please call your gastroenterologist to clarify.  If you requested that your care partner not be given the details of your procedure findings, then the procedure report has been included in a sealed envelope for you to review at your convenience later.  YOU SHOULD EXPECT: Some feelings of bloating in the abdomen. Passage of more gas than usual.  Walking can help get rid of the air that was put into your GI tract during the procedure and reduce the bloating. If you had a lower endoscopy (such as a colonoscopy or flexible sigmoidoscopy) you may notice spotting of blood in your stool or on the toilet paper. If you underwent a bowel prep for your procedure, you may not have a normal bowel movement for a few days.  Please Note:  You might notice some irritation and congestion in your nose or some drainage.  This is from the oxygen used during your procedure.  There is no need for concern and it should clear up in a day or so.  SYMPTOMS TO REPORT IMMEDIATELY:    Following upper endoscopy (EGD)  Vomiting of blood or coffee ground material  New chest pain or pain under the shoulder blades  Painful or persistently difficult swallowing  New shortness of breath  Fever of 100F or higher  Black, tarry-looking stools  For urgent or emergent issues, a gastroenterologist can be reached at any hour by calling [phone].   DIET:  We do recommend a small meal at first, but  then you may proceed to your regular diet.  Drink plenty of fluids but you should avoid alcoholic beverages for 24 hours.  ACTIVITY:  You should plan to take it easy for the rest of today and you should NOT DRIVE or use heavy machinery until tomorrow (because of the sedation medicines used during the test).    FOLLOW UP: Our staff will call the number listed on your records 48-72 hours following your procedure to check on you and address any questions or concerns that you may have regarding the information given to you following your procedure. If we do not reach you, we will leave a message.  We will attempt to reach you two times.  During this call, we will ask if you have developed any symptoms of COVID 19. If you develop any symptoms (ie: fever, flu-like symptoms, shortness of breath, cough etc.) before then, please call [phone].  If you test positive for Covid 19 in the 2 weeks post procedure, please call and report this information to [place].    If any biopsies were taken you will be contacted by phone or by letter within the next 1-3 weeks.  Please call us at [phone] if you have not heard about the biopsies in 3 weeks.    SIGNATURES/CONFIDENTIALITY: You and/or your care partner have signed paperwork which will be entered into your electronic medical record.  These signatures attest to the fact that that  the information above on your After Visit Summary has been reviewed and is understood.  Full responsibility of the confidentiality of this discharge information lies with you and/or your care-partner.

## 2019-08-17 ENCOUNTER — Telehealth: Payer: 59 | Admitting: Physician Assistant

## 2019-08-17 ENCOUNTER — Other Ambulatory Visit: Payer: Self-pay

## 2019-08-17 DIAGNOSIS — D509 Iron deficiency anemia, unspecified: Secondary | ICD-10-CM

## 2019-08-18 ENCOUNTER — Telehealth: Payer: Self-pay | Admitting: *Deleted

## 2019-08-18 ENCOUNTER — Telehealth: Payer: Self-pay

## 2019-08-18 NOTE — Telephone Encounter (Signed)
Left message on 2nd follow up call. 

## 2019-08-18 NOTE — Telephone Encounter (Signed)
  Follow up Call-  Call back number 08/16/2019 05/26/2017  Post procedure Call Back phone  # [phone]  Permission to leave phone message Yes Yes  Some recent data might be hidden     Patient questions:  Message left to call us if necessary.

## 2019-08-26 NOTE — Progress Notes (Signed)
PCP:  Levin Erp, MD Primary Cardiologist: No primary care provider on file. Electrophysiologist: None   Kayla Hendrix is a 68 y.o. female with history of OSA intolerant to CPAP, HTN, HLD, DM2, and persistent Afib who presents today for cardiac clearance for laparoscopic cholecystectomy. They are seen for Dr Rayann Heman.  She has been following with Dr. [place] Cellar for gastric polyps with recent EGD. Pt also had a 5.2 cm gallstone discovered on prior ultrasound. She has been asymptomatic, but this puts her at an increased risk of GB malignancy (with stone > 3 cm). She has been referred for cholecystectomy, and presents today for cardiac clearance.   She is doing well overall. She denies symptoms of palpitations, chest pain, shortness of breath, orthopnea, PND, lower extremity edema, claudication, presyncope, syncope, bleeding, or neurologic sequela. The patient is tolerating medications without difficulties.  She has occasional, non-specific epigastric pain that is not related to activity or position, that she has felt like has been related to her GI issues.  She has occasional dizziness when turning her head quickly.   The patient feels that she is tolerating medications without difficulties and is otherwise without complaint today.   Echo 08/2017 LVEF 65-70%  Past Medical History:  Diagnosis Date  . Allergy   . Anemia   . Anxiety   . Atrial fibrillation (HCC)    paroxysmal only asa 81 mg, no other blood thinner, the xarelto she was on gave her HAs  . Barrett's esophagus 2018  . CAD (coronary artery disease)    cath 2006- nonobstructive CAD  . Cataract   . Depression   . Diabetes mellitus without complication ([place])   . Gastric polyp   . GERD (gastroesophageal reflux disease)   . Glucose intolerance (impaired glucose tolerance)   . Hiatal hernia   . History of transesophageal echocardiography (TEE) for monitoring    TEE 3/17:  EF 55%, no RWMA, mild plaque in descending aorta,  mild MR, mod BAE, normal RVF  . HL (hearing loss)   . HTN (hypertension)   . Hyperlipidemia   . Hyperplastic colon polyp   . Internal hemorrhoids   . Intestinal metaplasia of gastric mucosa   . Obesity   . OSA (obstructive sleep apnea)    noncompliant with CPAP  . Tubular adenoma of colon    Past Surgical History:  Procedure Laterality Date  . CARDIOVERSION N/A 01/23/2016   Procedure: CARDIOVERSION;  Surgeon: Larey Dresser, MD;  Location: [facility];  Service: Cardiovascular;  Laterality: N/A;  . CESAREAN SECTION    . GANGLION CYST EXCISION Left 1970s  . TEE WITHOUT CARDIOVERSION N/A 01/23/2016   Procedure: TRANSESOPHAGEAL ECHOCARDIOGRAM (TEE);  Surgeon: Larey Dresser, MD;  Location: [facility];  Service: Cardiovascular;  Laterality: N/A;  . TOTAL ABDOMINAL HYSTERECTOMY    . WRIST FRACTURE SURGERY  2010   wrist fracture repair with metal rod    Current Outpatient Medications  Medication Sig Dispense Refill  . clonazePAM (KLONOPIN) 0.5 MG tablet Take 0.5 mg by mouth daily.     Marland Kitchen diltiazem (TIAZAC) 360 MG 24 hr capsule Take 360 mg by mouth daily.    Marland Kitchen ELIQUIS 5 MG TABS tablet TAKE 1 TABLET BY MOUTH TWICE A DAY 60 tablet 10  . Ferrous Sulfate (IRON) 325 (65 Fe) MG TABS Take 1 tablet by mouth 2 (two) times daily.    Marland Kitchen glimepiride (AMARYL) 4 MG tablet Take 4 mg by mouth 2 (two) times daily.  99  .  hydrochlorothiazide (HYDRODIURIL) 25 MG tablet Take 25 mg by mouth daily.    . metFORMIN (GLUCOPHAGE) 500 MG tablet Take 1,000 mg by mouth 2 (two) times daily with a meal.     . metoprolol tartrate (LOPRESSOR) 50 MG tablet Take 75 mg by mouth as directed. Patient takes 1 tablet in the morning and 1/2 tablet at night    . omeprazole (PRILOSEC) 20 MG capsule Take 20 mg by mouth daily.     . sitaGLIPtin (JANUVIA) 100 MG tablet Take 100 mg by mouth daily.    . Turmeric ([employer] TURMERIC CURCUMIN) 500 MG CAPS Take by mouth.     No current facility-administered medications for this visit.      Allergies  Allergen Reactions  . Hydrocodone Itching    Social History   Socioeconomic History  . Marital status: Single    Spouse name: Not on file  . Number of children: Not on file  . Years of education: Not on file  . Highest education level: Not on file  Occupational History  . Not on file  Social Needs  . Financial resource strain: Not on file  . Food insecurity    Worry: Not on file    Inability: Not on file  . Transportation needs    Medical: Not on file    Non-medical: Not on file  Tobacco Use  . Smoking status: Former Smoker    Quit date: 2010    Years since quitting: 10.8  . Smokeless tobacco: Never Used  . Tobacco comment: 15-pack-year smoker  Substance and Sexual Activity  . Alcohol use: No    Comment: rarely  . Drug use: No  . Sexual activity: Not on file  Lifestyle  . Physical activity    Days per week: Not on file    Minutes per session: Not on file  . Stress: Not on file  Relationships  . Social Herbalist on phone: Not on file    Gets together: Not on file    Attends religious service: Not on file    Active member of club or organization: Not on file    Attends meetings of clubs or organizations: Not on file    Relationship status: Not on file  . Intimate partner violence    Fear of current or ex partner: Not on file    Emotionally abused: Not on file    Physically abused: Not on file    Forced sexual activity: Not on file  Other Topics Concern  . Not on file  Social History Narrative   Pt. Lives in [place] with her son. 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      Review of Systems: General: No chills, fever, night sweats or weight changes  Cardiovascular:  No chest pain, dyspnea on exertion, edema, orthopnea, palpitations, paroxysmal nocturnal dyspnea Dermatological: No rash, lesions or masses Respiratory: No cough, dyspnea Urologic: No  hematuria, dysuria Abdominal: No nausea, vomiting, diarrhea, bright red blood per rectum, melena, or hematemesis Neurologic: No visual changes, weakness, changes in mental status All other systems reviewed and are otherwise negative except as noted above.  Physical Exam: Vitals:   08/29/19 0835  BP: 130/68  Pulse: 69  SpO2: 98%  Weight: 232 lb (105.2 kg)  Height: 5\' 6"  (1.676 m)    GEN- The patient is well appearing, alert and oriented x 3 today.  HEENT: normocephalic, atraumatic; sclera clear, conjunctiva pink; hearing intact; oropharynx clear; neck supple, no JVP Lymph- no cervical lymphadenopathy Lungs- Clear to ausculation bilaterally, normal work of breathing.  No wheezes, rales, rhonchi Heart- Irregular rate and rhythm, no murmurs, rubs or gallops, PMI not laterally displaced GI- soft, non-tender, non-distended, bowel sounds present, no hepatosplenomegaly Extremities- no clubbing, cyanosis, or edema; DP/PT/radial pulses 2+ bilaterally MS- no significant deformity or atrophy Skin- warm and dry, no rash or lesion Psych- euthymic mood, full affect Neuro- strength and sensation are intact  EKG is ordered today. Personal review shows atrial fibrillation with controlled ventricular rates at 69 bpm.  Assessment and Plan: 1. Persistent Atrial fibrillation Continue eliquis for CHA2DS2-VASc of at least 4. OK to hold 2 days prior to lap chole. Then resume as soon as OK per surgery. Most recent Echo 2018 with EF 65%.   2. HTN Continue current regiment  3. Untreated OSA She does not tolerate CPAP mask. She is waiting to hear back about having the rest of her teeth removed so she can consider a "mouth appliance" to help with her OSA.   4. Obesity Body mass index is 37.45 kg/m.   5. Non obstructive CAD Noted on cath 2006 (30% LAD) No ischemic symptoms  5. Cardiac clearance for laparoscopic cholecystectomy Her Revised Cardiac Risk Index [place] Criteria) puts her at Low risk  (0.9%) of cardiac complication.  She is cleared to proceed with laparoscopic cholecystectomy from a cardiac perspective.  OK to hold Eliquis as above.   RTC to see me in 6 months. If has breakthrough Afib in the meantime, should follow up with Afib clinic.    Shirley Friar, PA-C  08/29/19 8:41 AM

## 2019-08-29 ENCOUNTER — Encounter: Payer: Self-pay | Admitting: Student

## 2019-08-29 ENCOUNTER — Ambulatory Visit: Payer: 59 | Admitting: Student

## 2019-08-29 ENCOUNTER — Telehealth: Payer: Self-pay | Admitting: Internal Medicine

## 2019-08-29 ENCOUNTER — Other Ambulatory Visit: Payer: Self-pay

## 2019-08-29 VITALS — BP 130/68 | HR 69 | Ht 66.0 in | Wt 232.0 lb

## 2019-08-29 DIAGNOSIS — I480 Paroxysmal atrial fibrillation: Secondary | ICD-10-CM

## 2019-08-29 DIAGNOSIS — I10 Essential (primary) hypertension: Secondary | ICD-10-CM

## 2019-08-29 NOTE — Telephone Encounter (Signed)
Pt inquired about pathology results.  

## 2019-08-29 NOTE — Telephone Encounter (Signed)
Dr Hilarie Fredrickson have you been trying to reach this pt regarding path results?

## 2019-08-29 NOTE — Patient Instructions (Signed)
Medication Instructions:   Your physician recommends that you continue on your current medications as directed. Please refer to the Current Medication list given to you today.  *If you need a refill on your cardiac medications before your next appointment, please call your pharmacy*  Lab Work:  None ordered today  Testing/Procedures:  None ordered today  Follow-Up: At [facility], you and your health needs are our priority.  As part of our continuing mission to provide you with exceptional heart care, we have created designated Provider Care Teams.  These Care Teams include your primary Cardiologist (physician) and Advanced Practice Providers (APPs -  Physician Assistants and Nurse Practitioners) who all work together to provide you with the care you need, when you need it.  Your next appointment:   6 months  The format for your next appointment:   In Person  Provider:   You may see Legrand Como "Oda Kilts, PA-CSeiler

## 2019-08-30 NOTE — Telephone Encounter (Signed)
Yes, I have tried her multiple times to no answer I will try her again today

## 2019-09-01 NOTE — Telephone Encounter (Signed)
I reached the patient by phone yesterday, see result note on recent pathology from upper endoscopy for details

## 2019-09-08 ENCOUNTER — Other Ambulatory Visit: Payer: Self-pay

## 2019-09-08 DIAGNOSIS — K317 Polyp of stomach and duodenum: Secondary | ICD-10-CM

## 2019-10-10 ENCOUNTER — Telehealth: Payer: Self-pay | Admitting: Internal Medicine

## 2019-10-10 NOTE — Telephone Encounter (Signed)
Spoke with the patient who was told her son did not have to be screened for COVID but may be asked to sit in the car or wear a mask while waiting in the waiting room. Verbalized understanding.   Discussed time of procedures and location. Verbalized understanding.

## 2019-10-13 ENCOUNTER — Other Ambulatory Visit (HOSPITAL_COMMUNITY)
Admission: RE | Admit: 2019-10-13 | Discharge: 2019-10-13 | Disposition: A | Discharge status: Home / Self Care | Payer: 59 | Source: Ambulatory Visit | Attending: Internal Medicine | Admitting: Internal Medicine

## 2019-10-13 DIAGNOSIS — Z01812 Encounter for preprocedural laboratory examination: Secondary | ICD-10-CM | POA: Diagnosis not present

## 2019-10-13 DIAGNOSIS — Z20828 Contact with and (suspected) exposure to other viral communicable diseases: Secondary | ICD-10-CM | POA: Insufficient documentation

## 2019-10-16 LAB — NOVEL CORONAVIRUS, NAA (HOSP ORDER, SEND-OUT TO REF LAB; TAT 18-24 HRS): SARS-CoV-2, NAA: NOT DETECTED

## 2019-10-16 MED ORDER — BUPIVACAINE LIPOSOME 1.3 % IJ SUSP
20.0000 mL | Freq: Once | INTRAMUSCULAR | Status: DC
  Filled 2019-10-16: qty 20

## 2019-10-17 ENCOUNTER — Encounter (HOSPITAL_COMMUNITY)
Admission: RE | Disposition: A | Discharge status: Home / Self Care | Payer: Self-pay | Source: Home / Self Care | Attending: Internal Medicine

## 2019-10-17 ENCOUNTER — Other Ambulatory Visit: Payer: Self-pay

## 2019-10-17 ENCOUNTER — Ambulatory Visit (HOSPITAL_COMMUNITY)
Admission: RE | Admit: 2019-10-17 | Discharge: 2019-10-17 | Disposition: A | Discharge status: Home / Self Care | Payer: 59 | Attending: Internal Medicine | Admitting: Internal Medicine

## 2019-10-17 ENCOUNTER — Ambulatory Visit (HOSPITAL_COMMUNITY): Payer: 59 | Admitting: Certified Registered"

## 2019-10-17 ENCOUNTER — Encounter (HOSPITAL_COMMUNITY): Payer: Self-pay | Admitting: Internal Medicine

## 2019-10-17 DIAGNOSIS — K2270 Barrett's esophagus without dysplasia: Secondary | ICD-10-CM | POA: Insufficient documentation

## 2019-10-17 DIAGNOSIS — G4733 Obstructive sleep apnea (adult) (pediatric): Secondary | ICD-10-CM | POA: Diagnosis not present

## 2019-10-17 DIAGNOSIS — R195 Other fecal abnormalities: Secondary | ICD-10-CM

## 2019-10-17 DIAGNOSIS — I10 Essential (primary) hypertension: Secondary | ICD-10-CM | POA: Insufficient documentation

## 2019-10-17 DIAGNOSIS — D3A098 Benign carcinoid tumors of other sites: Secondary | ICD-10-CM

## 2019-10-17 DIAGNOSIS — Z87891 Personal history of nicotine dependence: Secondary | ICD-10-CM | POA: Insufficient documentation

## 2019-10-17 DIAGNOSIS — Z885 Allergy status to narcotic agent status: Secondary | ICD-10-CM | POA: Diagnosis not present

## 2019-10-17 DIAGNOSIS — I2510 Atherosclerotic heart disease of native coronary artery without angina pectoris: Secondary | ICD-10-CM | POA: Diagnosis not present

## 2019-10-17 DIAGNOSIS — I4891 Unspecified atrial fibrillation: Secondary | ICD-10-CM | POA: Insufficient documentation

## 2019-10-17 DIAGNOSIS — Z7901 Long term (current) use of anticoagulants: Secondary | ICD-10-CM | POA: Diagnosis not present

## 2019-10-17 DIAGNOSIS — E1136 Type 2 diabetes mellitus with diabetic cataract: Secondary | ICD-10-CM | POA: Insufficient documentation

## 2019-10-17 DIAGNOSIS — D3A092 Benign carcinoid tumor of the stomach: Secondary | ICD-10-CM | POA: Diagnosis not present

## 2019-10-17 DIAGNOSIS — Z86010 Personal history of colonic polyps: Secondary | ICD-10-CM | POA: Insufficient documentation

## 2019-10-17 DIAGNOSIS — Z7984 Long term (current) use of oral hypoglycemic drugs: Secondary | ICD-10-CM | POA: Insufficient documentation

## 2019-10-17 DIAGNOSIS — E669 Obesity, unspecified: Secondary | ICD-10-CM | POA: Diagnosis not present

## 2019-10-17 DIAGNOSIS — K317 Polyp of stomach and duodenum: Secondary | ICD-10-CM | POA: Diagnosis not present

## 2019-10-17 DIAGNOSIS — K219 Gastro-esophageal reflux disease without esophagitis: Secondary | ICD-10-CM | POA: Insufficient documentation

## 2019-10-17 HISTORY — PX: POLYPECTOMY: SHX5525

## 2019-10-17 HISTORY — PX: HEMOSTASIS CLIP PLACEMENT: SHX6857

## 2019-10-17 HISTORY — PX: BIOPSY: SHX5522

## 2019-10-17 HISTORY — PX: ESOPHAGOGASTRODUODENOSCOPY (EGD) WITH PROPOFOL: SHX5813

## 2019-10-17 LAB — GLUCOSE, CAPILLARY: Glucose-Capillary: 135 mg/dL — ABNORMAL HIGH (ref 70–99)

## 2019-10-17 SURGERY — ESOPHAGOGASTRODUODENOSCOPY (EGD) WITH PROPOFOL
Anesthesia: Monitor Anesthesia Care

## 2019-10-17 MED ORDER — SODIUM CHLORIDE 0.9 % IV SOLN: INTRAVENOUS | Status: DC

## 2019-10-17 MED ORDER — PROPOFOL 10 MG/ML IV BOLUS
INTRAVENOUS | Status: AC
  Filled 2019-10-17: qty 20

## 2019-10-17 MED ORDER — LACTATED RINGERS IV SOLN
INTRAVENOUS | Status: DC
  Administered 2019-10-17: 09:00:00 via INTRAVENOUS

## 2019-10-17 MED ORDER — PROPOFOL 500 MG/50ML IV EMUL
INTRAVENOUS | Status: AC
  Filled 2019-10-17: qty 50

## 2019-10-17 MED ORDER — PROPOFOL 500 MG/50ML IV EMUL
INTRAVENOUS | Status: DC | PRN
  Administered 2019-10-17: 75 ug/kg/min via INTRAVENOUS

## 2019-10-17 MED ORDER — PROPOFOL 500 MG/50ML IV EMUL
INTRAVENOUS | Status: DC | PRN
  Administered 2019-10-17 (×4): 20 mg via INTRAVENOUS

## 2019-10-17 MED ORDER — GLYCOPYRROLATE 0.2 MG/ML IJ SOLN
INTRAMUSCULAR | Status: DC | PRN
  Administered 2019-10-17: 0.1 mg via INTRAVENOUS

## 2019-10-17 SURGICAL SUPPLY — 15 items

## 2019-10-17 NOTE — Op Note (Signed)
[facility] Patient Name: Kayla Hendrix Procedure Date: 10/17/2019 MRN: [id number] Attending MD: Jerene Bears , MD Date of Birth: 08-Sep-1951 CSN: [id number] Age: 68 Admit Type: Outpatient Procedure:                Upper GI endoscopy Indications:              For therapy of gastric polyps (EGD in Oct 2020)                            with sampling of gastric polyps with 1 found to be                            gastric carcinoid; she returns for polypectomy Providers:                Lajuan Lines. Hilarie Fredrickson, MD, Angus Seller, Marguerita Merles,                            Technician Referring MD:             Lance Muss Medicines:                Monitored Anesthesia Care Complications:            No immediate complications. Estimated Blood Loss:     Estimated blood loss was minimal. Procedure:                Pre-Anesthesia Assessment:                           - Prior to the procedure, a History and Physical                            was performed, and patient medications and                            allergies were reviewed. The patient's tolerance of                            previous anesthesia was also reviewed. The risks                            and benefits of the procedure and the sedation                            options and risks were discussed with the patient.                            All questions were answered, and informed consent                            was obtained. Prior Anticoagulants: The patient has                            taken Eliquis (apixaban), last dose was 2 days  prior to procedure. ASA Grade Assessment: III - A                            patient with severe systemic disease. After                            reviewing the risks and benefits, the patient was                            deemed in satisfactory condition to undergo the                            procedure.                           After obtaining  informed consent, the endoscope was                            passed under direct vision. Throughout the                            procedure, the patient's blood pressure, pulse, and                            oxygen saturations were monitored continuously. The                            [id number]-H190 [id number]) Olympus gastroscope was                            introduced through the mouth, and advanced to the                            second part of duodenum. The upper GI endoscopy was                            accomplished without difficulty. The patient                            tolerated the procedure well. Scope In: Scope Out: Findings:      There were esophageal mucosal changes secondary to established       short-segment Barrett's disease present in the lower third of the       esophagus. The maximum longitudinal extent of these mucosal changes was       2 cm in length.      Multiple 2 to 10 mm pedunculated and sessile polyps with no bleeding       were found in the cardia, in the gastric fundus and in the gastric body.       Four were greater than 5 mm. Three of these polyps were removed with a       hot snare. Resection and retrieval were complete. The polypectomy sites       looked clean and thus hemostatic clips were not placed. One polyp was       removed with a cold  snare (hot snare attempted, but snare was not       connected to the cautery unit). Resection and retrieval were complete       and there was oozing after polypectomy. For hemostasis, one hemostatic       clip was successfully placed. There was no bleeding at the end of the       maneuver.      Mildly scalloped mucosa was found in the duodenal bulb and in the second       portion of the duodenum. Biopsies for histology were taken with a cold       forceps for evaluation of celiac disease. Impression:               - Esophageal mucosal changes secondary to                            established short-segment  Barrett's disease.                           - Multiple gastric polyps as above. 4 polyps                            resected and retrieved.                           - Scalloped mucosa was found in the duodenum, rule                            out celiac disease. Biopsied. Moderate Sedation:      N/A Recommendation:           - Patient has a contact number available for                            emergencies. The signs and symptoms of potential                            delayed complications were discussed with the                            patient. Return to normal activities tomorrow.                            Written discharge instructions were provided to the                            patient.                           - Resume previous diet.                           - Continue present medications.                           - Await pathology results.                           -  Repeat upper endoscopy for surveillance based on                            pathology results.                           - Resume Eliquis (apixaban) at prior dose in 3                            days. Refer to managing physician for further                            adjustment of therapy.                           - Patient reports lower GI gas and intermittent                            loose/urgent stools. Trial of Florastor 250 mg                            twice daily x 1 month, then once daily x 2 months.                            If symptoms not improving then consider empiric                            treatment with rifaximin.                           - Office follow-up in 2-3 months. Procedure Code(s):        --- Professional ---                           [phone], Esophagogastroduodenoscopy, flexible,                            transoral; with removal of tumor(s), polyp(s), or                            other lesion(s) by snare technique                           43239, 16,  Esophagogastroduodenoscopy, flexible,                            transoral; with biopsy, single or multiple Diagnosis Code(s):        --- Professional ---                           K22.70, Barrett's esophagus without dysplasia                           K31.7, Polyp of stomach and duodenum  K31.89, Other diseases of stomach and duodenum CPT copyright 2019 American Medical Association. All rights reserved. The codes documented in this report are preliminary and upon coder review may  be revised to meet current compliance requirements. Jerene Bears, MD 10/17/2019 11:41:17 AM This report has been signed electronically. Number of Addenda: 0

## 2019-10-17 NOTE — Discharge Instructions (Addendum)
Resume ELIQUIS at previous dose on Thursday, Oct 20, 2019.  YOU HAD AN ENDOSCOPIC PROCEDURE TODAY: Refer to the procedure report and other information in the discharge instructions given to you for any specific questions about what was found during the examination. If this information does not answer your questions, please call [place] office at [phone] to clarify.   YOU SHOULD EXPECT: Some feelings of bloating in the abdomen. Passage of more gas than usual. Walking can help get rid of the air that was put into your GI tract during the procedure and reduce the bloating. If you had a lower endoscopy (such as a colonoscopy or flexible sigmoidoscopy) you may notice spotting of blood in your stool or on the toilet paper. Some abdominal soreness may be present for a day or two, also.  DIET: Your first meal following the procedure should be a light meal and then it is ok to progress to your normal diet. A half-sandwich or bowl of soup is an example of a good first meal. Heavy or fried foods are harder to digest and may make you feel nauseous or bloated. Drink plenty of fluids but you should avoid alcoholic beverages for 24 hours. If you had a esophageal dilation, please see attached instructions for diet.    ACTIVITY: Your care partner should take you home directly after the procedure. You should plan to take it easy, moving slowly for the rest of the day. You can resume normal activity the day after the procedure however YOU SHOULD NOT DRIVE, use power tools, machinery or perform tasks that involve climbing or major physical exertion for 24 hours (because of the sedation medicines used during the test).   SYMPTOMS TO REPORT IMMEDIATELY: A gastroenterologist can be reached at any hour. Please call [phone]  for any of the following symptoms:  Following lower endoscopy (colonoscopy, flexible sigmoidoscopy) Excessive amounts of blood in the stool  Significant tenderness, worsening of abdominal pains    Swelling of the abdomen that is new, acute  Fever of 100 or higher  Following upper endoscopy (EGD, EUS, ERCP, esophageal dilation) Vomiting of blood or coffee ground material  New, significant abdominal pain  New, significant chest pain or pain under the shoulder blades  Painful or persistently difficult swallowing  New shortness of breath  Black, tarry-looking or red, bloody stools  FOLLOW UP:  If any biopsies were taken you will be contacted by phone or by letter within the next 1-3 weeks. Call [phone]  if you have not heard about the biopsies in 3 weeks.  Please also call with any specific questions about appointments or follow up tests.

## 2019-10-17 NOTE — Anesthesia Postprocedure Evaluation (Signed)
Anesthesia Post Note  Patient: Kayla Hendrix  Procedure(s) Performed: ESOPHAGOGASTRODUODENOSCOPY (EGD) WITH PROPOFOL (N/A ) BIOPSY HEMOSTASIS CLIP PLACEMENT POLYPECTOMY     Patient location during evaluation: PACU Anesthesia Type: MAC Level of consciousness: awake and alert Pain management: pain level controlled Vital Signs Assessment: post-procedure vital signs reviewed and stable Respiratory status: spontaneous breathing, nonlabored ventilation, respiratory function stable and patient connected to nasal cannula oxygen Cardiovascular status: stable and blood pressure returned to baseline Postop Assessment: no apparent nausea or vomiting Anesthetic complications: no    Last Vitals:  Vitals:   10/17/19 1200 10/17/19 1205  BP: (!) 140/52   Pulse: 74 70  Resp: 17 15  Temp:    SpO2: 93% 96%    Last Pain:  Vitals:   10/17/19 1205  TempSrc:   PainSc: 0-No pain                 Effie Berkshire

## 2019-10-17 NOTE — Anesthesia Preprocedure Evaluation (Addendum)
Anesthesia Evaluation  Patient identified by MRN, date of birth, ID band Patient awake    Reviewed: Allergy & Precautions, NPO status , Patient's Chart, lab work & pertinent test results  Airway Mallampati: II  TM Distance: >3 FB Neck ROM: Full    Dental  (+) Poor Dentition, Missing, Chipped, Dental Advisory Given,    Pulmonary sleep apnea , former smoker,    breath sounds clear to auscultation       Cardiovascular hypertension, Pt. on medications and Pt. on home beta blockers + CAD  + dysrhythmias Atrial Fibrillation  Rhythm:Regular Rate:Normal     Neuro/Psych PSYCHIATRIC DISORDERS Anxiety Depression    GI/Hepatic Neg liver ROS, hiatal hernia, GERD  Medicated,  Endo/Other  diabetes, Type 2, Oral Hypoglycemic Agents  Renal/GU      Musculoskeletal negative musculoskeletal ROS (+)   Abdominal (+) + obese,   Peds  Hematology   Anesthesia Other Findings   Reproductive/Obstetrics                            Anesthesia Physical Anesthesia Plan  ASA: III  Anesthesia Plan: MAC   Post-op Pain Management:    Induction: Intravenous  PONV Risk Score and Plan: 0 and Propofol infusion  Airway Management Planned: Natural Airway and Nasal Cannula  Additional Equipment: None  Intra-op Plan:   Post-operative Plan:   Informed Consent: I have reviewed the patients History and Physical, chart, labs and discussed the procedure including the risks, benefits and alternatives for the proposed anesthesia with the patient or authorized representative who has indicated his/her understanding and acceptance.       Plan Discussed with: CRNA  Anesthesia Plan Comments:        Anesthesia Quick Evaluation

## 2019-10-17 NOTE — Progress Notes (Signed)
Error in charting; pt does not have legal guardian; pt can make her own decisions;

## 2019-10-17 NOTE — Progress Notes (Signed)
Pt does not have legal guardian; error in charting; pt is alert and oriented and can make her own decisions

## 2019-10-17 NOTE — H&P (Signed)
HPI: Kayla Hendrix is a 68 yo female with PMH of IDA, gastric polyps and recent EGD revealing gastric carcinoid who return for repeat EGD for polyp resection.  She has a hx of atrial fibrillation and uses Eliquis.  She had an upper endoscopy on 08/16/2019.  Gastric polyp was sampled and found to be carcinoid.  She has been off of Eliquis for the last 48 hours.  Last dose was 10/14/2019  No new complaints today.  Though she continues to have lower intestinal gas and intermittent loose stools.  Can be urgent.  Nonbloody.  Was to see surgeon for gallbladder, but hasn't yet.  Past Medical History:  Diagnosis Date  . Allergy   . Anemia   . Anxiety   . Atrial fibrillation (HCC)    paroxysmal only asa 81 mg, no other blood thinner, the xarelto she was on gave her HAs  . Barrett's esophagus 2018  . CAD (coronary artery disease)    cath 2006- nonobstructive CAD  . Cataract   . Depression   . Diabetes mellitus without complication ([place])   . Gastric polyp   . GERD (gastroesophageal reflux disease)   . Glucose intolerance (impaired glucose tolerance)   . Hiatal hernia   . History of transesophageal echocardiography (TEE) for monitoring    TEE 3/17:  EF 55%, no RWMA, mild plaque in descending aorta, mild MR, mod BAE, normal RVF  . HL (hearing loss)   . HTN (hypertension)   . Hyperlipidemia   . Hyperplastic colon polyp   . Internal hemorrhoids   . Intestinal metaplasia of gastric mucosa   . Obesity   . OSA (obstructive sleep apnea)    noncompliant with CPAP  . Tubular adenoma of colon     Past Surgical History:  Procedure Laterality Date  . CARDIOVERSION N/A 01/23/2016   Procedure: CARDIOVERSION;  Surgeon: Larey Dresser, MD;  Location: [facility];  Service: Cardiovascular;  Laterality: N/A;  . CESAREAN SECTION    . GANGLION CYST EXCISION Left 1970s  . TEE WITHOUT CARDIOVERSION N/A 01/23/2016   Procedure: TRANSESOPHAGEAL ECHOCARDIOGRAM (TEE);  Surgeon: Larey Dresser, MD;  Location: [facility];  Service: Cardiovascular;  Laterality: N/A;  . TOTAL ABDOMINAL HYSTERECTOMY    . WRIST FRACTURE SURGERY  2010   wrist fracture repair with metal rod    (Not in an outpatient encounter)   Allergies  Allergen Reactions  . Hydrocodone Itching    Family History  Problem Relation Age of Onset  . Cancer Father   . Stomach cancer Father   . Diabetes Other        noninsulin dependant DM  . Hypertension Other   . Stomach cancer Maternal Grandmother   . Colon cancer Neg Hx   . Colon polyps Neg Hx   . Esophageal cancer Neg Hx   . Rectal cancer Neg Hx     Social History   Tobacco Use  . Smoking status: Former Smoker    Quit date: 2010    Years since quitting: 10.9  . Smokeless tobacco: Never Used  . Tobacco comment: 15-pack-year smoker  Substance Use Topics  . Alcohol use: No    Comment: rarely  . Drug use: No    ROS: As per history of present illness, otherwise negative  BP (!) 152/67   Temp 98.3 F (36.8 C) (Oral)   Resp 15   SpO2 95%  Gen: awake, alert, NAD HEENT: anicteric, op clear CV: RRR, no mrg Pulm: CTA b/l Abd:  soft, NT/ND, +BS throughout Ext: no c/c/e Neuro: nonfocal   RELEVANT LABS AND IMAGING: CBC    Component Value Date/Time   WBC 9.6 07/26/2019 1624   RBC 4.74 07/26/2019 1624   HGB 10.6 (L) 07/26/2019 1624   HGB 12.1 01/08/2018 1644   HCT 33.8 (L) 07/26/2019 1624   HCT 36.3 01/08/2018 1644   PLT 298.0 07/26/2019 1624   PLT 307 01/08/2018 1644   MCV 71.2 (L) 07/26/2019 1624   MCV 78 (L) 01/08/2018 1644   MCV 86 04/11/2012 0457   MCH 24.7 (L) 10/11/2018 1515   MCHC 31.5 07/26/2019 1624   RDW 16.8 (H) 07/26/2019 1624   RDW 15.8 (H) 01/08/2018 1644   RDW 13.5 04/11/2012 0457   LYMPHSABS 3.0 07/26/2019 1624   LYMPHSABS 2.6 04/11/2012 0457   MONOABS 0.7 07/26/2019 1624   MONOABS 0.5 04/11/2012 0457   EOSABS 0.8 (H) 07/26/2019 1624   EOSABS 0.4 04/11/2012 0457   BASOSABS 0.1 07/26/2019 1624   BASOSABS 0.1 04/11/2012 0457     CMP     Component Value Date/Time   NA 136 10/11/2018 1515   NA 140 01/08/2018 1644   NA 145 04/10/2012 0117   K 4.1 10/11/2018 1515   K 3.7 04/10/2012 0117   CL 100 10/11/2018 1515   CL 109 (H) 04/10/2012 0117   CO2 22 10/11/2018 1515   CO2 29 04/10/2012 0117   GLUCOSE 194 (H) 10/11/2018 1515   GLUCOSE 85 04/10/2012 0117   BUN 13 10/11/2018 1515   BUN 15 01/08/2018 1644   BUN 10 04/10/2012 0117   CREATININE 1.07 (H) 10/11/2018 1515   CREATININE 0.67 01/21/2016 1118   CALCIUM 8.4 (L) 10/11/2018 1515   CALCIUM 8.1 (L) 04/10/2012 0117   PROT 6.1 (L) 04/08/2012 1939   ALBUMIN 3.3 (L) 04/08/2012 1939   AST 33 04/08/2012 1939   ALT 28 04/08/2012 1939   ALKPHOS 87 04/08/2012 1939   BILITOT 0.3 04/08/2012 1939   GFRNONAA 54 (L) 10/11/2018 1515   GFRNONAA >60 04/10/2012 0117   GFRAA >60 10/11/2018 1515   GFRAA >60 04/10/2012 0117    ASSESSMENT/PLAN: 68 yo female with PMH of IDA, gastric polyps and recent EGD revealing gastric carcinoid who return for repeat EGD for polyp resection.  1. Gastric polyps/gastric carcinoid -- for repeat EGD and gastric polypectomy today.  She has been off Eliquis.  The nature of the procedure, as well as the risks, benefits, and alternatives were carefully and thoroughly reviewed with the patient. Ample time for discussion and questions allowed. The patient understood, was satisfied, and agreed to proceed.

## 2019-10-17 NOTE — Transfer of Care (Signed)
Immediate Anesthesia Transfer of Care Note  Patient: Kayla [place]rix  Procedure(s) Performed: ESOPHAGOGASTRODUODENOSCOPY (EGD) WITH PROPOFOL (N/A ) BIOPSY HEMOSTASIS CLIP PLACEMENT POLYPECTOMY  Patient Location: PACU  Anesthesia Type:MAC  Level of Consciousness: drowsy and patient cooperative  Airway & Oxygen Therapy: Patient Spontanous Breathing and Patient connected to nasal cannula oxygen  Post-op Assessment: Report given to RN and Post -op Vital signs reviewed and stable  Post vital signs: Reviewed and stable  Last Vitals:  Vitals Value Taken Time  BP 131/53 10/17/19 1140  Temp 36.9 C 10/17/19 1140  Pulse 71 10/17/19 1140  Resp 18 10/17/19 1140  SpO2 95 % 10/17/19 1140  Vitals shown include unvalidated device data.  Last Pain:  Vitals:   10/17/19 1140  TempSrc: Temporal  PainSc:          Complications: No apparent anesthesia complications

## 2019-10-18 ENCOUNTER — Other Ambulatory Visit: Payer: Self-pay

## 2019-10-19 LAB — SURGICAL PATHOLOGY

## 2019-10-20 ENCOUNTER — Encounter: Payer: Self-pay | Admitting: Internal Medicine

## 2019-10-21 ENCOUNTER — Encounter: Payer: Self-pay | Admitting: *Deleted

## 2019-11-28 ENCOUNTER — Telehealth: Payer: Self-pay | Admitting: Internal Medicine

## 2019-11-28 NOTE — Telephone Encounter (Signed)
STAT if HR is under 50 or over 120 (normal HR is 60-100 beats per minute)  1) What is your heart rate?  120  2) Do you have a log of your heart rate readings (document readings)?   3) Do you have any other symptoms?  A little short of breath- not at this time- she is sitting still

## 2019-11-28 NOTE — Telephone Encounter (Signed)
I spoke to the patient who called because she is experiencing fluctuating HR, maximum to 120 bpm, SOB and extremely tired.  She denies CP.  She saw Oda Kilts, PA on 10/19, who recommended a f/u in [facility] if symptoms occur prior to a 6 month f/u with him.  I told the patient she will be called to get scheduled.  She verbalized understanding.

## 2019-11-28 NOTE — Telephone Encounter (Signed)
Routed to [facility] triage

## 2019-11-29 ENCOUNTER — Ambulatory Visit (HOSPITAL_COMMUNITY)
Admission: RE | Admit: 2019-11-29 | Discharge: 2019-11-29 | Disposition: A | Discharge status: Home / Self Care | Payer: Managed Care, Other (non HMO) | Source: Ambulatory Visit | Attending: Nurse Practitioner | Admitting: Nurse Practitioner

## 2019-11-29 ENCOUNTER — Encounter (HOSPITAL_COMMUNITY): Payer: Self-pay | Admitting: *Deleted

## 2019-11-29 ENCOUNTER — Other Ambulatory Visit: Payer: Self-pay

## 2019-11-29 ENCOUNTER — Encounter (HOSPITAL_COMMUNITY): Payer: Self-pay | Admitting: Nurse Practitioner

## 2019-11-29 VITALS — BP 110/72 | HR 119 | Ht 66.0 in | Wt 226.6 lb

## 2019-11-29 DIAGNOSIS — E669 Obesity, unspecified: Secondary | ICD-10-CM | POA: Insufficient documentation

## 2019-11-29 DIAGNOSIS — Z800 Family history of malignant neoplasm of digestive organs: Secondary | ICD-10-CM | POA: Insufficient documentation

## 2019-11-29 DIAGNOSIS — I4819 Other persistent atrial fibrillation: Secondary | ICD-10-CM | POA: Diagnosis not present

## 2019-11-29 DIAGNOSIS — D6869 Other thrombophilia: Secondary | ICD-10-CM

## 2019-11-29 DIAGNOSIS — G4733 Obstructive sleep apnea (adult) (pediatric): Secondary | ICD-10-CM | POA: Diagnosis not present

## 2019-11-29 DIAGNOSIS — I480 Paroxysmal atrial fibrillation: Secondary | ICD-10-CM

## 2019-11-29 DIAGNOSIS — K219 Gastro-esophageal reflux disease without esophagitis: Secondary | ICD-10-CM | POA: Diagnosis not present

## 2019-11-29 DIAGNOSIS — Z7984 Long term (current) use of oral hypoglycemic drugs: Secondary | ICD-10-CM | POA: Diagnosis not present

## 2019-11-29 DIAGNOSIS — H9190 Unspecified hearing loss, unspecified ear: Secondary | ICD-10-CM | POA: Insufficient documentation

## 2019-11-29 DIAGNOSIS — Z885 Allergy status to narcotic agent status: Secondary | ICD-10-CM | POA: Diagnosis not present

## 2019-11-29 DIAGNOSIS — Z79899 Other long term (current) drug therapy: Secondary | ICD-10-CM | POA: Diagnosis not present

## 2019-11-29 DIAGNOSIS — Z87891 Personal history of nicotine dependence: Secondary | ICD-10-CM | POA: Insufficient documentation

## 2019-11-29 DIAGNOSIS — I2510 Atherosclerotic heart disease of native coronary artery without angina pectoris: Secondary | ICD-10-CM | POA: Insufficient documentation

## 2019-11-29 DIAGNOSIS — E119 Type 2 diabetes mellitus without complications: Secondary | ICD-10-CM | POA: Insufficient documentation

## 2019-11-29 DIAGNOSIS — F419 Anxiety disorder, unspecified: Secondary | ICD-10-CM | POA: Diagnosis not present

## 2019-11-29 DIAGNOSIS — Z6836 Body mass index (BMI) 36.0-36.9, adult: Secondary | ICD-10-CM | POA: Insufficient documentation

## 2019-11-29 DIAGNOSIS — Z7901 Long term (current) use of anticoagulants: Secondary | ICD-10-CM | POA: Insufficient documentation

## 2019-11-29 DIAGNOSIS — I10 Essential (primary) hypertension: Secondary | ICD-10-CM | POA: Insufficient documentation

## 2019-11-29 DIAGNOSIS — E785 Hyperlipidemia, unspecified: Secondary | ICD-10-CM | POA: Insufficient documentation

## 2019-11-29 LAB — CBC
HCT: 38.9 % (ref 36.0–46.0)
Hemoglobin: 11.8 g/dL — ABNORMAL LOW (ref 12.0–15.0)
MCH: 23.7 pg — ABNORMAL LOW (ref 26.0–34.0)
MCHC: 30.3 g/dL (ref 30.0–36.0)
MCV: 78.3 fL — ABNORMAL LOW (ref 80.0–100.0)
Platelets: 329 10*3/uL (ref 150–400)
RBC: 4.97 MIL/uL (ref 3.87–5.11)
RDW: 15.6 % — ABNORMAL HIGH (ref 11.5–15.5)
WBC: 8.9 10*3/uL (ref 4.0–10.5)
nRBC: 0 % (ref 0.0–0.2)

## 2019-11-29 LAB — BASIC METABOLIC PANEL
Anion gap: 14 (ref 5–15)
BUN: 11 mg/dL (ref 8–23)
CO2: 25 mmol/L (ref 22–32)
Calcium: 8.8 mg/dL — ABNORMAL LOW (ref 8.9–10.3)
Chloride: 102 mmol/L (ref 98–111)
Creatinine, Ser: 1 mg/dL (ref 0.44–1.00)
GFR calc Af Amer: >60 mL/min (ref >60)
GFR calc non Af Amer: 58 mL/min — ABNORMAL LOW (ref >60)
Glucose, Bld: 141 mg/dL — ABNORMAL HIGH (ref 70–99)
Potassium: 4.3 mmol/L (ref 3.5–5.1)
Sodium: 141 mmol/L (ref 135–145)

## 2019-11-29 LAB — TSH: TSH: 2.564 u[IU]/mL (ref 0.350–4.500)

## 2019-11-29 MED ORDER — METOPROLOL TARTRATE 50 MG PO TABS: ORAL_TABLET | ORAL | 3 refills | Status: DC

## 2019-11-29 NOTE — Patient Instructions (Signed)
Cardioversion scheduled for Wednesday, January 27th  - Arrive at the [facility] and go to admitting at 9:30AM  -Do not eat or drink anything after midnight the night prior to your  procedure.  - Take all your medication with a sip of water prior to arrival.  - You will not be able to drive home after your procedure.  Increase metoprolol to 1 tablet twice a day -- night before cardioversion go back to 1 tablet in the morning and 1/2 tablet in the evening.

## 2019-11-29 NOTE — Progress Notes (Signed)
Primary Care Physician: Levin Erp, MD Referring Physician: Dr. Jyl Heinz Kingsford is a 69 y.o. female with a h/o paroxysmal afib that is in the afib  for persistent afib that has been present since Sunday. She was scheduled for a cardioversion in 2019 but converted herself and DCCV was cancelled. Last  cardioversion was 2017. This  is the first afib that she  has had since 2019 that persisted for more than a few minutes.  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 She feels the stress from that may be her trigger. No missed doses of eliquis for the last 3 weeks, CHA2DS2VASc score of 4. Has OSA but intolerant to cpap.   Today, she denies symptoms of palpitations, chest pain, shortness of breath, orthopnea, PND, lower extremity edema, dizziness, presyncope, syncope, or neurologic sequela. The patient is tolerating medications without difficulties and is otherwise without complaint today.   Past Medical History:  Diagnosis Date  . Allergy   . Anemia   . Anxiety   . Atrial fibrillation (HCC)    paroxysmal only asa 81 mg, no other blood thinner, the xarelto she was on gave her HAs  . Barrett's esophagus 2018  . CAD (coronary artery disease)    cath 2006- nonobstructive CAD  . Cataract   . Depression   . Diabetes mellitus without complication ([place])   . Gastric polyp   . GERD (gastroesophageal reflux disease)   . Glucose intolerance (impaired glucose tolerance)   . Hiatal hernia   . History of transesophageal echocardiography (TEE) for monitoring    TEE 3/17:  EF 55%, no RWMA, mild plaque in descending aorta, mild MR, mod BAE, normal RVF  . HL (hearing loss)   . HTN (hypertension)   . Hyperlipidemia   . Hyperplastic colon polyp   . Internal hemorrhoids   . Intestinal metaplasia of gastric mucosa   . Obesity   . OSA (obstructive sleep apnea)    noncompliant with CPAP  . Tubular adenoma of colon    Past Surgical History:  Procedure  Laterality Date  . BIOPSY  10/17/2019   Procedure: BIOPSY;  Surgeon: Jerene Bears, MD;  Location: [facility];  Service: Gastroenterology;;  . CARDIOVERSION N/A 01/23/2016   Procedure: CARDIOVERSION;  Surgeon: Larey Dresser, MD;  Location: [facility];  Service: Cardiovascular;  Laterality: N/A;  . CESAREAN SECTION    . ESOPHAGOGASTRODUODENOSCOPY (EGD) WITH PROPOFOL N/A 10/17/2019   Procedure: ESOPHAGOGASTRODUODENOSCOPY (EGD) WITH PROPOFOL;  Surgeon: Jerene Bears, MD;  Location: [facility];  Service: Gastroenterology;  Laterality: N/A;  . GANGLION CYST EXCISION Left 1970s  . HEMOSTASIS CLIP PLACEMENT  10/17/2019   Procedure: HEMOSTASIS CLIP PLACEMENT;  Surgeon: Jerene Bears, MD;  Location: [facility];  Service: Gastroenterology;;  . POLYPECTOMY  10/17/2019   Procedure: POLYPECTOMY;  Surgeon: Jerene Bears, MD;  Location: [facility];  Service: Gastroenterology;;  . TEE WITHOUT CARDIOVERSION N/A 01/23/2016   Procedure: TRANSESOPHAGEAL ECHOCARDIOGRAM (TEE);  Surgeon: Larey Dresser, MD;  Location: [facility];  Service: Cardiovascular;  Laterality: N/A;  . TOTAL ABDOMINAL HYSTERECTOMY    . WRIST FRACTURE SURGERY  2010   wrist fracture repair with metal rod    Current Outpatient Medications  Medication Sig Dispense Refill  . clonazePAM (KLONOPIN) 0.5 MG tablet Take 0.5 mg by mouth daily.     Marland Kitchen diltiazem (TIAZAC) 360 MG 24 hr capsule Take 360 mg by mouth  daily.    Marland Kitchen ELIQUIS 5 MG TABS tablet TAKE 1 TABLET BY MOUTH TWICE A DAY 60 tablet 10  . Ferrous Sulfate (IRON) 325 (65 Fe) MG TABS Take 1 tablet by mouth 2 (two) times daily.    Marland Kitchen gabapentin (NEURONTIN) 300 MG capsule Take 300 mg by mouth 2 (two) times daily.    Marland Kitchen glimepiride (AMARYL) 4 MG tablet Take 4 mg by mouth 2 (two) times daily.  99  . glucose blood (ACCU-CHEK AVIVA PLUS) test strip CHECK BLOOD SUGAR TWICE DAILY AS DIRECTED    . hydrochlorothiazide (HYDRODIURIL) 25 MG tablet Take 25 mg by mouth daily.    . metFORMIN  (GLUCOPHAGE) 500 MG tablet Take 1,000 mg by mouth 2 (two) times daily with a meal.     . metoprolol tartrate (LOPRESSOR) 50 MG tablet Take 75 mg by mouth as directed. Patient takes 1 tablet in the morning and 1/2 tablet at night    . omeprazole (PRILOSEC) 20 MG capsule Take 20 mg by mouth daily.     . sitaGLIPtin (JANUVIA) 100 MG tablet Take 100 mg by mouth daily.     No current facility-administered medications for this encounter.    Allergies  Allergen Reactions  . Hydrocodone Itching    Social History   Socioeconomic History  . Marital status: Single    Spouse name: Not on file  . Number of children: Not on file  . Years of education: Not on file  . Highest education level: Not on file  Occupational History  . Not on file  Tobacco Use  . Smoking status: Former Smoker    Quit date: 2010    Years since quitting: 11.0  . Smokeless tobacco: Never Used  . Tobacco comment: 15-pack-year smoker  Substance and Sexual Activity  . Alcohol use: No    Comment: rarely  . Drug use: No  . Sexual activity: Not on file  Other Topics Concern  . Not on file  Social History Narrative   Pt. Lives in [place] with her son. 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    Social Determinants of Health   Financial Resource Strain:   . Difficulty of Paying Living Expenses: Not on file  Food Insecurity:   . Worried About Charity fundraiser in the Last Year: Not on file  . Ran Out of Food in the Last Year: Not on file  Transportation Needs:   . Lack of Transportation (Medical): Not on file  . Lack of Transportation (Non-Medical): Not on file  Physical Activity:   . Days of Exercise per Week: Not on file  . Minutes of Exercise per Session: Not on file  Stress:   . Feeling of Stress : Not on file  Social Connections:   . Frequency of Communication with Friends and Family: Not on file  . Frequency of Social  Gatherings with Friends and Family: Not on file  . Attends Religious Services: Not on file  . Active Member of Clubs or Organizations: Not on file  . Attends Archivist Meetings: Not on file  . Marital Status: Not on file  Intimate Partner Violence:   . Fear of Current or Ex-Partner: Not on file  . Emotionally Abused: Not on file  . Physically Abused: Not on file  . Sexually Abused: Not on file    Family History  Problem Relation Age of Onset  .  Cancer Father   . Stomach cancer Father   . Diabetes Other        noninsulin dependant DM  . Hypertension Other   . Stomach cancer Maternal Grandmother   . Colon cancer Neg Hx   . Colon polyps Neg Hx   . Esophageal cancer Neg Hx   . Rectal cancer Neg Hx     ROS- All systems are reviewed and negative except as per the HPI above  Physical Exam: Vitals:   11/29/19 0951  BP: 110/72  Pulse: (!) 119  SpO2: 96%  Weight: 102.8 kg  Height: 5\' 6"  (1.676 m)   Wt Readings from Last 3 Encounters:  11/29/19 102.8 kg  10/17/19 106.1 kg  08/29/19 105.2 kg    Labs: Lab Results  Component Value Date   NA 136 10/11/2018   K 4.1 10/11/2018   CL 100 10/11/2018   CO2 22 10/11/2018   GLUCOSE 194 (H) 10/11/2018   BUN 13 10/11/2018   CREATININE 1.07 (H) 10/11/2018   CALCIUM 8.4 (L) 10/11/2018   MG 1.7 01/02/2016   Lab Results  Component Value Date   INR 1.0 04/08/2012   Lab Results  Component Value Date   CHOL 159 04/10/2012   HDL 34 (L) 04/10/2012   LDLCALC 79 04/10/2012   TRIG 230 (H) 04/10/2012     GEN- The patient is well appearing, alert and oriented x 3 today.   Head- normocephalic, atraumatic Eyes-  Sclera clear, conjunctiva pink Ears- hearing intact Oropharynx- clear Neck- supple, no JVP Lymph- no cervical lymphadenopathy Lungs- Clear to ausculation bilaterally, normal work of breathing Heart-irregular rate and rhythm, no murmurs, rubs or gallops, PMI not laterally displaced GI- soft, NT, ND, +  BS Extremities- no clubbing, cyanosis, or edema MS- no significant deformity or atrophy Skin- no rash or lesion Psych- euthymic mood, full affect Neuro- strength and sensation are intact  EKG- afib 119   bpm, qrs int 88 bpm, qtc 497 ms  Cath 2006- CONCLUSION:  1.  Well-preserved overall left ventricular function.  2.  Calcification of mid left anterior descending artery without critical      stenosis.  3.  ScStudy Conclusions Echo-08/2017 - Left ventricle: The cavity size was normal. Wall thickness was   increased in a pattern of mild LVH. Systolic function was   vigorous. The estimated ejection fraction was in the range of 65%   to 70%. Left ventricular diastolic function parameters were   normal.attered luminal irregularities throughout the coronary system.   Assessment and Plan: 1. Paroxysmal afib  Has been persistent afib  since last sunday Will increase metoprolol 50 mg form 1 in am and 1/2 in pm to 50 mg bid until cardioversion Will schedule for DCCV Continue eliquis 5 mg bid CHA2DS2VASc score is at least 5 States no missed doses Continue cardizem 360 mg daily Cbc/bmet/tsh today    Butch Penny C. Lamyiah Crawshaw, [facility] [address] [place], [place] [address] [phone]

## 2019-11-29 NOTE — Addendum Note (Signed)
Encounter addended by: Enid Derry, CMA on: 11/29/2019 11:34 AM  Actions taken: Order list changed

## 2019-12-03 ENCOUNTER — Other Ambulatory Visit (HOSPITAL_COMMUNITY)
Admission: RE | Admit: 2019-12-03 | Discharge: 2019-12-03 | Disposition: A | Discharge status: Home / Self Care | Payer: Managed Care, Other (non HMO) | Source: Ambulatory Visit | Attending: Internal Medicine | Admitting: Internal Medicine

## 2019-12-03 DIAGNOSIS — Z20822 Contact with and (suspected) exposure to covid-19: Secondary | ICD-10-CM | POA: Insufficient documentation

## 2019-12-03 DIAGNOSIS — Z01812 Encounter for preprocedural laboratory examination: Secondary | ICD-10-CM | POA: Diagnosis not present

## 2019-12-03 LAB — SARS CORONAVIRUS 2 (TAT 6-24 HRS): SARS Coronavirus 2: NEGATIVE

## 2019-12-07 ENCOUNTER — Encounter (HOSPITAL_COMMUNITY)
Admission: RE | Disposition: A | Discharge status: Home / Self Care | Payer: Self-pay | Source: Home / Self Care | Attending: Internal Medicine

## 2019-12-07 ENCOUNTER — Ambulatory Visit (HOSPITAL_COMMUNITY): Payer: Managed Care, Other (non HMO) | Admitting: Anesthesiology

## 2019-12-07 ENCOUNTER — Encounter (HOSPITAL_COMMUNITY): Payer: Self-pay | Admitting: Internal Medicine

## 2019-12-07 ENCOUNTER — Other Ambulatory Visit: Payer: Self-pay

## 2019-12-07 ENCOUNTER — Ambulatory Visit (HOSPITAL_COMMUNITY)
Admission: RE | Admit: 2019-12-07 | Discharge: 2019-12-07 | Disposition: A | Discharge status: Home / Self Care | Payer: Managed Care, Other (non HMO) | Attending: Internal Medicine | Admitting: Internal Medicine

## 2019-12-07 DIAGNOSIS — E785 Hyperlipidemia, unspecified: Secondary | ICD-10-CM | POA: Diagnosis not present

## 2019-12-07 DIAGNOSIS — G4733 Obstructive sleep apnea (adult) (pediatric): Secondary | ICD-10-CM | POA: Diagnosis not present

## 2019-12-07 DIAGNOSIS — H9190 Unspecified hearing loss, unspecified ear: Secondary | ICD-10-CM | POA: Insufficient documentation

## 2019-12-07 DIAGNOSIS — Z7984 Long term (current) use of oral hypoglycemic drugs: Secondary | ICD-10-CM | POA: Diagnosis not present

## 2019-12-07 DIAGNOSIS — Z7901 Long term (current) use of anticoagulants: Secondary | ICD-10-CM | POA: Insufficient documentation

## 2019-12-07 DIAGNOSIS — I2510 Atherosclerotic heart disease of native coronary artery without angina pectoris: Secondary | ICD-10-CM | POA: Diagnosis not present

## 2019-12-07 DIAGNOSIS — Z79899 Other long term (current) drug therapy: Secondary | ICD-10-CM | POA: Diagnosis not present

## 2019-12-07 DIAGNOSIS — Z87891 Personal history of nicotine dependence: Secondary | ICD-10-CM | POA: Diagnosis not present

## 2019-12-07 DIAGNOSIS — D649 Anemia, unspecified: Secondary | ICD-10-CM | POA: Insufficient documentation

## 2019-12-07 DIAGNOSIS — I4819 Other persistent atrial fibrillation: Secondary | ICD-10-CM | POA: Diagnosis not present

## 2019-12-07 DIAGNOSIS — F419 Anxiety disorder, unspecified: Secondary | ICD-10-CM | POA: Insufficient documentation

## 2019-12-07 DIAGNOSIS — F329 Major depressive disorder, single episode, unspecified: Secondary | ICD-10-CM | POA: Diagnosis not present

## 2019-12-07 DIAGNOSIS — E119 Type 2 diabetes mellitus without complications: Secondary | ICD-10-CM | POA: Diagnosis not present

## 2019-12-07 DIAGNOSIS — E669 Obesity, unspecified: Secondary | ICD-10-CM | POA: Diagnosis not present

## 2019-12-07 DIAGNOSIS — K219 Gastro-esophageal reflux disease without esophagitis: Secondary | ICD-10-CM | POA: Insufficient documentation

## 2019-12-07 DIAGNOSIS — I10 Essential (primary) hypertension: Secondary | ICD-10-CM | POA: Insufficient documentation

## 2019-12-07 HISTORY — PX: CARDIOVERSION: SHX1299

## 2019-12-07 LAB — GLUCOSE, CAPILLARY: Glucose-Capillary: 124 mg/dL — ABNORMAL HIGH (ref 70–99)

## 2019-12-07 SURGERY — CARDIOVERSION
Anesthesia: General

## 2019-12-07 MED ORDER — SODIUM CHLORIDE 0.9 % IV SOLN: INTRAVENOUS | Status: DC | PRN

## 2019-12-07 MED ORDER — PROPOFOL 10 MG/ML IV BOLUS
INTRAVENOUS | Status: DC | PRN
  Administered 2019-12-07: 40 ug via INTRAVENOUS

## 2019-12-07 NOTE — CV Procedure (Signed)
   CARDIOVERSION NOTE  Procedure: Electrical Cardioversion Indications:  Atrial Fibrillation  Procedure Details:  Consent: Risks of procedure as well as the alternatives and risks of each were explained to the (patient/caregiver).  Consent for procedure obtained.  Time Out: Verified patient identification, verified procedure, site/side was marked, verified correct patient position, special equipment/implants available, medications/allergies/relevent history reviewed, required imaging and test results available.  Performed  Patient placed on cardiac monitor, pulse oximetry, supplemental oxygen as necessary.  Sedation given: propofol per anesthesia Pacer pads placed anterior and posterior chest.  Cardioverted 1 time(s).  Cardioverted at 150J biphasic.  Impression: Findings: Post procedure EKG shows: NSR Complications: None Patient did tolerate procedure well.  Plan: 1. Successful DCCV to NSR with single 150J biphasic shock.  Time Spent Directly with the Patient:  30 minutes   Pixie Casino, MD, [facility], [place] Director of the Advanced Lipid Disorders &  Cardiovascular Risk Reduction Clinic Diplomate of the American Board of Clinical Lipidology Attending Cardiologist  Direct Dial: [phone]  Fax: [phone]  Website:  [email or web address] 12/07/2019, 10:33 AM

## 2019-12-07 NOTE — Anesthesia Preprocedure Evaluation (Addendum)
Anesthesia Evaluation  Patient identified by MRN, date of birth, ID band Patient awake    Reviewed: Allergy & Precautions, NPO status , Patient's Chart, lab work & pertinent test results  History of Anesthesia Complications Negative for: history of anesthetic complications  Airway Mallampati: III  TM Distance: >3 FB Neck ROM: Full    Dental  (+) Dental Advisory Given   Pulmonary shortness of breath, sleep apnea , former smoker,    breath sounds clear to auscultation       Cardiovascular hypertension, + CAD  + dysrhythmias  Rhythm:Irregular     Neuro/Psych PSYCHIATRIC DISORDERS Anxiety Depression negative neurological ROS     GI/Hepatic Neg liver ROS, hiatal hernia, GERD  ,  Endo/Other  diabetes  Renal/GU negative Renal ROS     Musculoskeletal   Abdominal   Peds  Hematology  (+) Blood dyscrasia, anemia ,   Anesthesia Other Findings   Reproductive/Obstetrics                            Anesthesia Physical Anesthesia Plan  ASA: III  Anesthesia Plan: General   Post-op Pain Management:    Induction: Intravenous  PONV Risk Score and Plan: 3 and Treatment may vary due to age or medical condition  Airway Management Planned: Mask  Additional Equipment: None  Intra-op Plan:   Post-operative Plan:   Informed Consent: I have reviewed the patients History and Physical, chart, labs and discussed the procedure including the risks, benefits and alternatives for the proposed anesthesia with the patient or authorized representative who has indicated his/her understanding and acceptance.     Dental advisory given  Plan Discussed with: CRNA and Surgeon  Anesthesia Plan Comments:         Anesthesia Quick Evaluation

## 2019-12-07 NOTE — H&P (Signed)
   INTERVAL PROCEDURE H&P  History and Physical Interval Note:  12/07/2019 9:52 AM  Kayla Hendrix has presented today for their planned procedure. The various methods of treatment have been discussed with the patient and family. After consideration of risks, benefits and other options for treatment, the patient has consented to the procedure.  The patients' outpatient history has been reviewed, patient examined, and no change in status from most recent office note within the past 30 days. I have reviewed the patients' chart and labs and will proceed as planned. Questions were answered to the patient's satisfaction.   Kayla Casino, MD, [facility], [place] Director of the Advanced Lipid Disorders &  Cardiovascular Risk Reduction Clinic Diplomate of the American Board of Clinical Lipidology Attending Cardiologist  Direct Dial: [phone]  Fax: [phone]  Website:  [email or web address] 12/07/2019, 9:52 AM

## 2019-12-07 NOTE — Transfer of Care (Signed)
Immediate Anesthesia Transfer of Care Note  Patient: Kayla Hendrix  Procedure(s) Performed: CARDIOVERSION (N/A )  Patient Location: Endoscopy Unit  Anesthesia Type:General  Level of Consciousness: awake, alert  and oriented  Airway & Oxygen Therapy: Patient Spontanous Breathing and Patient connected to nasal cannula oxygen  Post-op Assessment: Report given to RN and Post -op Vital signs reviewed and stable  Post vital signs: Reviewed and stable  Last Vitals:  Vitals Value Taken Time  BP    Temp    Pulse    Resp    SpO2      Last Pain:  Vitals:   12/07/19 1009  TempSrc: Oral  PainSc: 0-No pain         Complications: No apparent anesthesia complications

## 2019-12-08 ENCOUNTER — Encounter: Payer: Self-pay | Admitting: *Deleted

## 2019-12-13 ENCOUNTER — Ambulatory Visit: Payer: Managed Care, Other (non HMO) | Attending: Internal Medicine

## 2019-12-13 DIAGNOSIS — Z20822 Contact with and (suspected) exposure to covid-19: Secondary | ICD-10-CM

## 2019-12-13 NOTE — Anesthesia Postprocedure Evaluation (Signed)
Anesthesia Post Note  Patient: Kayla Hendrix  Procedure(s) Performed: CARDIOVERSION (N/A )     Patient location during evaluation: Endoscopy Anesthesia Type: General Level of consciousness: awake and alert Pain management: pain level controlled Vital Signs Assessment: post-procedure vital signs reviewed and stable Respiratory status: spontaneous breathing, nonlabored ventilation, respiratory function stable and patient connected to nasal cannula oxygen Cardiovascular status: blood pressure returned to baseline and stable Postop Assessment: no apparent nausea or vomiting Anesthetic complications: no    Last Vitals:  Vitals:   12/07/19 1045 12/07/19 1050  BP: (!) 111/45 (!) 114/47  Pulse: 65 65  Resp: 13 16  Temp:    SpO2: 94% 96%    Last Pain:  Vitals:   12/08/19 1322  TempSrc:   PainSc: 0-No pain                 Angeligue Bowne

## 2019-12-14 ENCOUNTER — Ambulatory Visit (HOSPITAL_COMMUNITY): Payer: Managed Care, Other (non HMO) | Admitting: Nurse Practitioner

## 2019-12-14 LAB — NOVEL CORONAVIRUS, NAA: SARS-CoV-2, NAA: NOT DETECTED

## 2019-12-23 ENCOUNTER — Ambulatory Visit: Payer: Managed Care, Other (non HMO) | Attending: Internal Medicine

## 2019-12-23 DIAGNOSIS — Z20822 Contact with and (suspected) exposure to covid-19: Secondary | ICD-10-CM

## 2019-12-24 LAB — NOVEL CORONAVIRUS, NAA: SARS-CoV-2, NAA: NOT DETECTED

## 2019-12-30 ENCOUNTER — Ambulatory Visit (HOSPITAL_COMMUNITY): Payer: Managed Care, Other (non HMO) | Admitting: Nurse Practitioner

## 2019-12-30 ENCOUNTER — Encounter (HOSPITAL_COMMUNITY): Payer: Self-pay | Admitting: Nurse Practitioner

## 2019-12-30 ENCOUNTER — Ambulatory Visit (HOSPITAL_COMMUNITY)
Admission: RE | Admit: 2019-12-30 | Discharge: 2019-12-30 | Disposition: A | Discharge status: Home / Self Care | Payer: Managed Care, Other (non HMO) | Source: Ambulatory Visit | Attending: Nurse Practitioner | Admitting: Nurse Practitioner

## 2019-12-30 ENCOUNTER — Other Ambulatory Visit: Payer: Self-pay

## 2019-12-30 VITALS — BP 156/58 | HR 64 | Ht 66.0 in | Wt 226.2 lb

## 2019-12-30 DIAGNOSIS — D6869 Other thrombophilia: Secondary | ICD-10-CM | POA: Diagnosis not present

## 2019-12-30 DIAGNOSIS — I480 Paroxysmal atrial fibrillation: Secondary | ICD-10-CM | POA: Diagnosis present

## 2019-12-30 DIAGNOSIS — I2510 Atherosclerotic heart disease of native coronary artery without angina pectoris: Secondary | ICD-10-CM | POA: Diagnosis not present

## 2019-12-30 DIAGNOSIS — Z885 Allergy status to narcotic agent status: Secondary | ICD-10-CM | POA: Diagnosis not present

## 2019-12-30 DIAGNOSIS — K219 Gastro-esophageal reflux disease without esophagitis: Secondary | ICD-10-CM | POA: Insufficient documentation

## 2019-12-30 DIAGNOSIS — Z833 Family history of diabetes mellitus: Secondary | ICD-10-CM | POA: Diagnosis not present

## 2019-12-30 DIAGNOSIS — E119 Type 2 diabetes mellitus without complications: Secondary | ICD-10-CM | POA: Diagnosis not present

## 2019-12-30 DIAGNOSIS — I10 Essential (primary) hypertension: Secondary | ICD-10-CM | POA: Insufficient documentation

## 2019-12-30 DIAGNOSIS — Z7901 Long term (current) use of anticoagulants: Secondary | ICD-10-CM | POA: Diagnosis not present

## 2019-12-30 DIAGNOSIS — Z7984 Long term (current) use of oral hypoglycemic drugs: Secondary | ICD-10-CM | POA: Diagnosis not present

## 2019-12-30 DIAGNOSIS — Z8249 Family history of ischemic heart disease and other diseases of the circulatory system: Secondary | ICD-10-CM | POA: Insufficient documentation

## 2019-12-30 DIAGNOSIS — Z87891 Personal history of nicotine dependence: Secondary | ICD-10-CM | POA: Insufficient documentation

## 2019-12-30 DIAGNOSIS — G4733 Obstructive sleep apnea (adult) (pediatric): Secondary | ICD-10-CM | POA: Insufficient documentation

## 2019-12-30 DIAGNOSIS — Z79899 Other long term (current) drug therapy: Secondary | ICD-10-CM | POA: Insufficient documentation

## 2019-12-30 DIAGNOSIS — E785 Hyperlipidemia, unspecified: Secondary | ICD-10-CM | POA: Diagnosis not present

## 2019-12-30 NOTE — Progress Notes (Addendum)
Primary Care Physician: Levin Erp, MD Referring Physician: [name] is a 69 y.o. female with a h/o paroxysmal afib that is in the afib clinic 11/29/19  for persistent afib that had been present since the  previous  Sunday. She was scheduled for a cardioversion in 2019 but converted herself and DCCV was cancelled. Last  cardioversion was 2017. This  is the first afib that she  has had since 2019 that persisted for more than a few minutes.  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 She feels the stress from that may have been  her trigger. No missed doses of eliquis for the last 3 weeks, CHA2DS2VASc score of 4. Has OSA but intolerant to cpap.  F/u in afib clinic, 12/30/19. She has successful cardioversion 12/07/19 and continues in SR. She feels improved.   Today, she denies symptoms of palpitations, chest pain, shortness of breath, orthopnea, PND, lower extremity edema, dizziness, presyncope, syncope, or neurologic sequela. The patient is tolerating medications without difficulties and is otherwise without complaint today.   Past Medical History:  Diagnosis Date  . Allergy   . Anemia   . Anxiety   . Atrial fibrillation (HCC)    paroxysmal only asa 81 mg, no other blood thinner, the xarelto she was on gave her HAs  . Barrett's esophagus 2018  . CAD (coronary artery disease)    cath 2006- nonobstructive CAD  . Cataract   . Depression   . Diabetes mellitus without complication ([place])   . Gastric polyp   . GERD (gastroesophageal reflux disease)   . Glucose intolerance (impaired glucose tolerance)   . Hiatal hernia   . History of transesophageal echocardiography (TEE) for monitoring    TEE 3/17:  EF 55%, no RWMA, mild plaque in descending aorta, mild MR, mod BAE, normal RVF  . HL (hearing loss)   . HTN (hypertension)   . Hyperlipidemia   . Hyperplastic colon polyp   . Internal hemorrhoids   . Intestinal metaplasia of gastric  mucosa   . Obesity   . OSA (obstructive sleep apnea)    noncompliant with CPAP  . Tubular adenoma of colon    Past Surgical History:  Procedure Laterality Date  . BIOPSY  10/17/2019   Procedure: BIOPSY;  Surgeon: Jerene Bears, MD;  Location: [facility];  Service: Gastroenterology;;  . CARDIOVERSION N/A 01/23/2016   Procedure: CARDIOVERSION;  Surgeon: Larey Dresser, MD;  Location: [facility] ENDOSCOPY;  Service: Cardiovascular;  Laterality: N/A;  . CARDIOVERSION N/A 12/07/2019   Procedure: CARDIOVERSION;  Surgeon: Pixie Casino, MD;  Location: [place];  Service: Cardiovascular;  Laterality: N/A;  . CESAREAN SECTION    . ESOPHAGOGASTRODUODENOSCOPY (EGD) WITH PROPOFOL N/A 10/17/2019   Procedure: ESOPHAGOGASTRODUODENOSCOPY (EGD) WITH PROPOFOL;  Surgeon: Jerene Bears, MD;  Location: [facility]OSCOPY;  Service: Gastroenterology;  Laterality: N/A;  . GANGLION CYST EXCISION Left 1970s  . HEMOSTASIS CLIP PLACEMENT  10/17/2019   Procedure: HEMOSTASIS CLIP PLACEMENT;  Surgeon: Jerene Bears, MD;  Location: [facility]OS[facility];  Service: Gastroenterology;;  . POLYPECTOMY  10/17/2019   Procedure: POLYPECTOMY;  Surgeon: Jerene Bears, MD;  Location: [facility];  Service: Gastroenterology;;  . TEE WITHOUT CARDIOVERSION N/A 01/23/2016   Procedure: TRANSESOPHAGEAL ECHOCARDIOGRAM (TEE);  Surgeon: Larey Dresser, MD;  Location: [place];  Service: Cardiovascular;  Laterality: N/A;  . TOTAL ABDOMINAL HYSTERECTOMY    . WRIST FRACTURE SURGERY  2010   wrist fracture repair with metal rod    Current Outpatient Medications  Medication Sig Dispense Refill  . clonazePAM (KLONOPIN) 0.5 MG tablet Taking 1 tablet daily    . diltiazem (TIAZAC) 360 MG 24 hr capsule Take 360 mg by mouth daily.    Marland Kitchen ELIQUIS 5 MG TABS tablet TAKE 1 TABLET BY MOUTH TWICE A DAY 60 tablet 10  . Ferrous Sulfate (IRON) 325 (65 Fe) MG TABS Take 325 mg by mouth daily.     Marland Kitchen gabapentin (NEURONTIN) 300 MG capsule Take 300 mg by mouth 2 (two)  times daily.    Marland Kitchen glimepiride (AMARYL) 4 MG tablet Take 4 mg by mouth 2 (two) times daily.  99  . glucose blood (ACCU-CHEK AVIVA PLUS) test strip CHECK BLOOD SUGAR TWICE DAILY AS DIRECTED    . hydrochlorothiazide (HYDRODIURIL) 25 MG tablet Take 25 mg by mouth daily.    . metFORMIN (GLUCOPHAGE) 500 MG tablet Take 1,000 mg by mouth 2 (two) times daily with a meal.     . metoprolol tartrate (LOPRESSOR) 50 MG tablet Take one tablet in the am and 1/2 tablet in the pm and take extra 1/2 tablet for break through a-fib (Patient taking differently: No sig reported) 60 tablet 3  . omeprazole (PRILOSEC) 20 MG capsule Take 20 mg by mouth daily.     . sitaGLIPtin (JANUVIA) 100 MG tablet Take 100 mg by mouth daily.     No current facility-administered medications for this encounter.    Allergies  Allergen Reactions  . Hydrocodone Itching    Social History   Socioeconomic History  . Marital status: Single    Spouse name: Not on file  . Number of children: Not on file  . Years of education: Not on file  . Highest education level: Not on file  Occupational History  . Not on file  Tobacco Use  . Smoking status: Former Smoker    Quit date: 2010    Years since quitting: 11.1  . Smokeless tobacco: Never Used  . Tobacco comment: 15-pack-year smoker  Substance and Sexual Activity  . Alcohol use: No    Comment: rarely  . Drug use: No  . Sexual activity: Not on file  Other Topics Concern  . Not on file  Social History Narrative   Pt. Lives in [place] with her son. 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    Social Determinants of Health   Financial Resource Strain:   . Difficulty of Paying Living Expenses: Not on file  Food Insecurity:   . Worried About Charity fundraiser in the Last Year: Not on file  . Ran Out of Food in the Last Year: Not on file  Transportation Needs:   . Lack of Transportation (Medical):  Not on file  . Lack of Transportation (Non-Medical): Not on file  Physical Activity:   . Days of Exercise per Week: Not on file  . Minutes of Exercise per Session: Not on file  Stress:   . Feeling of Stress : Not on file  Social Connections:   . Frequency of Communication with Friends and Family: Not on file  . Frequency of Social Gatherings with Friends and Family: Not on file  . Attends Religious Services: Not on file  . Active Member of Clubs or Organizations: Not on file  . Attends Archivist Meetings: Not on file  .  Marital Status: Not on file  Intimate Partner Violence:   . Fear of Current or Ex-Partner: Not on file  . Emotionally Abused: Not on file  . Physically Abused: Not on file  . Sexually Abused: Not on file    Family History  Problem Relation Age of Onset  . Cancer Father   . Stomach cancer Father   . Diabetes Other        noninsulin dependant DM  . Hypertension Other   . Stomach cancer Maternal Grandmother   . Colon cancer Neg Hx   . Colon polyps Neg Hx   . Esophageal cancer Neg Hx   . Rectal cancer Neg Hx     ROS- All systems are reviewed and negative except as per the HPI above  Physical Exam: Vitals:   12/30/19 1143  BP: (!) 156/58  Pulse: 64  Weight: 102.6 kg  Height: 5\' 6"  (1.676 m)   Wt Readings from Last 3 Encounters:  12/30/19 102.6 kg  11/29/19 102.8 kg  10/17/19 106.1 kg    Labs: Lab Results  Component Value Date   NA 141 11/29/2019   K 4.3 11/29/2019   CL 102 11/29/2019   CO2 25 11/29/2019   GLUCOSE 141 (H) 11/29/2019   BUN 11 11/29/2019   CREATININE 1.00 11/29/2019   CALCIUM 8.8 (L) 11/29/2019   MG 1.7 01/02/2016   Lab Results  Component Value Date   INR 1.0 04/08/2012   Lab Results  Component Value Date   CHOL 159 04/10/2012   HDL 34 (L) 04/10/2012   LDLCALC 79 04/10/2012   TRIG 230 (H) 04/10/2012     GEN- The patient is well appearing, alert and oriented x 3 today.   Head- normocephalic,  atraumatic Eyes-  Sclera clear, conjunctiva pink Ears- hearing intact Oropharynx- clear Neck- supple, no JVP Lymph- no cervical lymphadenopathy Lungs- Clear to ausculation bilaterally, normal work of breathing Heart-regular rate and rhythm, no murmurs, rubs or gallops, PMI not laterally displaced GI- soft, NT, ND, + BS Extremities- no clubbing, cyanosis, or edema MS- no significant deformity or atrophy Skin- no rash or lesion Psych- euthymic mood, full affect Neuro- strength and sensation are intact  EKG-  SR with first degree AV block  at 64 bpm, PR int 212 ms, qrs int 94 ms, qtc 476 ms  Cath 2006- CONCLUSION:  1.  Well-preserved overall left ventricular function.  2.  Calcification of mid left anterior descending artery without critical    stenosis.   Echo-08/2017 - Left ventricle: The cavity size was normal. Wall thickness was   increased in a pattern of mild LVH. Systolic function was   vigorous. The estimated ejection fraction was in the range of 65%   to 70%. Left ventricular diastolic function parameters were   normal.attered luminal irregularities throughout the coronary system.   Assessment and Plan: 1. Paroxysmal afib  Has been persistent afib  For several days and received successful DCCV Continue  metoprolol 50 mg bid   Continue eliquis 5 mg bid for 4 weeks after cardioversion without interruption  CHA2DS2VASc score is at least 5 Continue cardizem 360 mg daily  2. HTN Elevated  today but at home has been in stable range   F/u with Oda Kilts, PA, April 4th  as scheduled    Geroge Baseman. Raseel Jans, [facility] [address] [place], [place] [address] [phone]

## 2020-03-14 ENCOUNTER — Encounter (HOSPITAL_COMMUNITY): Payer: Self-pay | Admitting: Nurse Practitioner

## 2020-03-14 ENCOUNTER — Ambulatory Visit (HOSPITAL_COMMUNITY)
Admission: RE | Admit: 2020-03-14 | Discharge: 2020-03-14 | Disposition: A | Discharge status: Home / Self Care | Payer: No Typology Code available for payment source | Source: Ambulatory Visit | Attending: Nurse Practitioner | Admitting: Nurse Practitioner

## 2020-03-14 ENCOUNTER — Other Ambulatory Visit: Payer: Self-pay

## 2020-03-14 VITALS — BP 154/50 | HR 46 | Ht 66.0 in | Wt 233.2 lb

## 2020-03-14 DIAGNOSIS — Z833 Family history of diabetes mellitus: Secondary | ICD-10-CM | POA: Diagnosis not present

## 2020-03-14 DIAGNOSIS — E119 Type 2 diabetes mellitus without complications: Secondary | ICD-10-CM | POA: Diagnosis not present

## 2020-03-14 DIAGNOSIS — K219 Gastro-esophageal reflux disease without esophagitis: Secondary | ICD-10-CM | POA: Diagnosis not present

## 2020-03-14 DIAGNOSIS — R531 Weakness: Secondary | ICD-10-CM | POA: Diagnosis not present

## 2020-03-14 DIAGNOSIS — I2510 Atherosclerotic heart disease of native coronary artery without angina pectoris: Secondary | ICD-10-CM | POA: Insufficient documentation

## 2020-03-14 DIAGNOSIS — I10 Essential (primary) hypertension: Secondary | ICD-10-CM | POA: Diagnosis not present

## 2020-03-14 DIAGNOSIS — Z885 Allergy status to narcotic agent status: Secondary | ICD-10-CM | POA: Diagnosis not present

## 2020-03-14 DIAGNOSIS — Z7901 Long term (current) use of anticoagulants: Secondary | ICD-10-CM | POA: Diagnosis not present

## 2020-03-14 DIAGNOSIS — Z7984 Long term (current) use of oral hypoglycemic drugs: Secondary | ICD-10-CM | POA: Insufficient documentation

## 2020-03-14 DIAGNOSIS — I480 Paroxysmal atrial fibrillation: Secondary | ICD-10-CM | POA: Insufficient documentation

## 2020-03-14 DIAGNOSIS — Z79899 Other long term (current) drug therapy: Secondary | ICD-10-CM | POA: Insufficient documentation

## 2020-03-14 DIAGNOSIS — F419 Anxiety disorder, unspecified: Secondary | ICD-10-CM | POA: Insufficient documentation

## 2020-03-14 DIAGNOSIS — Z87891 Personal history of nicotine dependence: Secondary | ICD-10-CM | POA: Insufficient documentation

## 2020-03-14 DIAGNOSIS — E785 Hyperlipidemia, unspecified: Secondary | ICD-10-CM | POA: Insufficient documentation

## 2020-03-14 DIAGNOSIS — G4733 Obstructive sleep apnea (adult) (pediatric): Secondary | ICD-10-CM | POA: Insufficient documentation

## 2020-03-14 DIAGNOSIS — Z8249 Family history of ischemic heart disease and other diseases of the circulatory system: Secondary | ICD-10-CM | POA: Diagnosis not present

## 2020-03-14 LAB — CBC
HCT: 35 % — ABNORMAL LOW (ref 36.0–46.0)
Hemoglobin: 10.4 g/dL — ABNORMAL LOW (ref 12.0–15.0)
MCH: 23 pg — ABNORMAL LOW (ref 26.0–34.0)
MCHC: 29.7 g/dL — ABNORMAL LOW (ref 30.0–36.0)
MCV: 77.4 fL — ABNORMAL LOW (ref 80.0–100.0)
Platelets: 280 10*3/uL (ref 150–400)
RBC: 4.52 MIL/uL (ref 3.87–5.11)
RDW: 15.4 % (ref 11.5–15.5)
WBC: 9.8 10*3/uL (ref 4.0–10.5)
nRBC: 0 % (ref 0.0–0.2)

## 2020-03-14 LAB — BASIC METABOLIC PANEL
Anion gap: 13 (ref 5–15)
BUN: 14 mg/dL (ref 8–23)
CO2: 26 mmol/L (ref 22–32)
Calcium: 8.4 mg/dL — ABNORMAL LOW (ref 8.9–10.3)
Chloride: 100 mmol/L (ref 98–111)
Creatinine, Ser: 1.08 mg/dL — ABNORMAL HIGH (ref 0.44–1.00)
GFR calc Af Amer: >60 mL/min (ref >60)
GFR calc non Af Amer: 53 mL/min — ABNORMAL LOW (ref >60)
Glucose, Bld: 131 mg/dL — ABNORMAL HIGH (ref 70–99)
Potassium: 4.4 mmol/L (ref 3.5–5.1)
Sodium: 139 mmol/L (ref 135–145)

## 2020-03-14 LAB — TSH: TSH: 1.888 u[IU]/mL (ref 0.350–4.500)

## 2020-03-14 NOTE — Progress Notes (Signed)
Primary Care Physician: Levin Erp, MD Referring Physician: [name] is a 69 y.o. female with a h/o paroxysmal afib that is in the afib clinic 11/29/19  for persistent afib that had been present since the  previous  Sunday. She was scheduled for a cardioversion in 2019 but converted herself and DCCV was cancelled. Last  cardioversion was 2017. This  is the first afib that she  has had since 2019 that persisted for more than a few minutes.  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 She feels the stress from that may have been  her trigger. No missed doses of eliquis for the last 3 weeks, CHA2DS2VASc score of 4. Has OSA but intolerant to cpap.  F/u in afib clinic, 12/30/19. She had successful cardioversion 12/07/19 and continues in SR. She feels improved.   Asked to be seen today as she felt weak today. EKG shows junctional rhythm at 46 bpm. She does not remember taking any extra medicine by mistake. She  felt well yesterday.   Today, she denies symptoms of palpitations, chest pain, shortness of breath, orthopnea, PND, lower extremity edema, dizziness, presyncope, syncope, or neurologic sequela. The patient is tolerating medications without difficulties and is otherwise without complaint today.   Past Medical History:  Diagnosis Date  . Allergy   . Anemia   . Anxiety   . Atrial fibrillation (HCC)    paroxysmal only asa 81 mg, no other blood thinner, the xarelto she was on gave her HAs  . Barrett's esophagus 2018  . CAD (coronary artery disease)    cath 2006- nonobstructive CAD  . Cataract   . Depression   . Diabetes mellitus without complication ([place])   . Gastric polyp   . GERD (gastroesophageal reflux disease)   . Glucose intolerance (impaired glucose tolerance)   . Hiatal hernia   . History of transesophageal echocardiography (TEE) for monitoring    TEE 3/17:  EF 55%, no RWMA, mild plaque in descending aorta, mild MR, mod  BAE, normal RVF  . HL (hearing loss)   . HTN (hypertension)   . Hyperlipidemia   . Hyperplastic colon polyp   . Internal hemorrhoids   . Intestinal metaplasia of gastric mucosa   . Obesity   . OSA (obstructive sleep apnea)    noncompliant with CPAP  . Tubular adenoma of colon    Past Surgical History:  Procedure Laterality Date  . BIOPSY  10/17/2019   Procedure: BIOPSY;  Surgeon: Jerene Bears, MD;  Location: [facility];  Service: Gastroenterology;;  . CARDIOVERSION N/A 01/23/2016   Procedure: CARDIOVERSION;  Surgeon: Larey Dresser, MD;  Location: [facility] END[facility]COPY;  Service: Cardiovascular;  Laterality: N/A;  . CARDIOVERSION N/A 12/07/2019   Procedure: CARDIOVERSION;  Surgeon: Pixie Casino, MD;  Location: [place];  Service: Cardiovascular;  Laterality: N/A;  . CESAREAN SECTION    . ESOPHAGOGASTRODUODENOSCOPY (EGD) WITH PROPOFOL N/A 10/17/2019   Procedure: ESOPHAGOGASTRODUODENOSCOPY (EGD) WITH PROPOFOL;  Surgeon: Jerene Bears, MD;  Location: [facility];  Service: Gastroenterology;  Laterality: N/A;  . GANGLION CYST EXCISION Left 1970s  . HEMOSTASIS CLIP PLACEMENT  10/17/2019   Procedure: HEMOSTASIS CLIP PLACEMENT;  Surgeon: Jerene Bears, MD;  Location: [facility];  Service: Gastroenterology;;  . POLYPECTOMY  10/17/2019   Procedure: POLYPECTOMY;  Surgeon: Jerene Bears, MD;  Location: [facility];  Service: Gastroenterology;;  . TEE WITHOUT CARDIOVERSION N/A 01/23/2016  Procedure: TRANSESOPHAGEAL ECHOCARDIOGRAM (TEE);  Surgeon: Larey Dresser, MD;  Location: [place];  Service: Cardiovascular;  Laterality: N/A;  . TOTAL ABDOMINAL HYSTERECTOMY    . WRIST FRACTURE SURGERY  2010   wrist fracture repair with metal rod    Current Outpatient Medications  Medication Sig Dispense Refill  . clonazePAM (KLONOPIN) 0.5 MG tablet Taking 1 tablet daily    . diltiazem (TIAZAC) 360 MG 24 hr capsule Take 360 mg by mouth daily.    Marland Kitchen ELIQUIS 5 MG TABS tablet TAKE 1 TABLET BY MOUTH  TWICE A DAY 60 tablet 10  . gabapentin (NEURONTIN) 300 MG capsule Take 300 mg by mouth 2 (two) times daily.    Marland Kitchen glimepiride (AMARYL) 4 MG tablet Take 4 mg by mouth 2 (two) times daily.  99  . glucose blood (ACCU-CHEK AVIVA PLUS) test strip CHECK BLOOD SUGAR TWICE DAILY AS DIRECTED    . hydrochlorothiazide (HYDRODIURIL) 25 MG tablet Take 25 mg by mouth daily.    . metFORMIN (GLUCOPHAGE) 500 MG tablet Take 1,000 mg by mouth 2 (two) times daily with a meal.     . metoprolol tartrate (LOPRESSOR) 50 MG tablet Take one tablet in the am and 1/2 tablet in the pm and take extra 1/2 tablet for break through a-fib (Patient taking differently: No sig reported) 60 tablet 3  . omeprazole (PRILOSEC) 20 MG capsule Take 20 mg by mouth daily.     . sitaGLIPtin (JANUVIA) 100 MG tablet Take 100 mg by mouth daily.    . Ferrous Sulfate (IRON) 325 (65 Fe) MG TABS Take 325 mg by mouth daily.      No current facility-administered medications for this encounter.    Allergies  Allergen Reactions  . Hydrocodone Itching    Social History   Socioeconomic History  . Marital status: Single    Spouse name: Not on file  . Number of children: Not on file  . Years of education: Not on file  . Highest education level: Not on file  Occupational History  . Not on file  Tobacco Use  . Smoking status: Former Smoker    Quit date: 2010    Years since quitting: 11.3  . Smokeless tobacco: Never Used  . Tobacco comment: 15-pack-year smoker  Substance and Sexual Activity  . Alcohol use: No    Comment: rarely  . Drug use: No  . Sexual activity: Not on file  Other Topics Concern  . Not on file  Social History Narrative   Pt. Lives in [place] with her son. 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    Social Determinants of Health   Financial Resource Strain:   . Difficulty of Paying Living Expenses:   Food Insecurity:   . Worried About  Charity fundraiser in the Last Year:   . Arboriculturist in the Last Year:   Transportation Needs:   . Film/video editor (Medical):   Marland Kitchen Lack of Transportation (Non-Medical):   Physical Activity:   . Days of Exercise per Week:   . Minutes of Exercise per Session:   Stress:   . Feeling of Stress :   Social Connections:   . Frequency of Communication with Friends and Family:   . Frequency of Social Gatherings with Friends and Family:   . Attends Religious Services:   . Active Member of Clubs or Organizations:   .  Attends Archivist Meetings:   Marland Kitchen Marital Status:   Intimate Partner Violence:   . Fear of Current or Ex-Partner:   . Emotionally Abused:   Marland Kitchen Physically Abused:   . Sexually Abused:     Family History  Problem Relation Age of Onset  . Cancer Father   . Stomach cancer Father   . Diabetes Other        noninsulin dependant DM  . Hypertension Other   . Stomach cancer Maternal Grandmother   . Colon cancer Neg Hx   . Colon polyps Neg Hx   . Esophageal cancer Neg Hx   . Rectal cancer Neg Hx     ROS- All systems are reviewed and negative except as per the HPI above  Physical Exam: Vitals:   03/14/20 1441  BP: (!) 154/50  Pulse: (!) 46  Weight: 105.8 kg  Height: 5\' 6"  (1.676 m)   Wt Readings from Last 3 Encounters:  03/14/20 105.8 kg  12/30/19 102.6 kg  11/29/19 102.8 kg    Labs: Lab Results  Component Value Date   NA 141 11/29/2019   K 4.3 11/29/2019   CL 102 11/29/2019   CO2 25 11/29/2019   GLUCOSE 141 (H) 11/29/2019   BUN 11 11/29/2019   CREATININE 1.00 11/29/2019   CALCIUM 8.8 (L) 11/29/2019   MG 1.7 01/02/2016   Lab Results  Component Value Date   INR 1.0 04/08/2012   Lab Results  Component Value Date   CHOL 159 04/10/2012   HDL 34 (L) 04/10/2012   LDLCALC 79 04/10/2012   TRIG 230 (H) 04/10/2012     GEN- The patient is well appearing, alert and oriented x 3 today.   Head- normocephalic, atraumatic Eyes-  Sclera clear,  conjunctiva pink Ears- hearing intact Oropharynx- clear Neck- supple, no JVP Lymph- no cervical lymphadenopathy Lungs- Clear to ausculation bilaterally, normal work of breathing Heart-regular rate and rhythm, no murmurs, rubs or gallops, PMI not laterally displaced GI- soft, NT, ND, + BS Extremities- no clubbing, cyanosis, or edema MS- no significant deformity or atrophy Skin- no rash or lesion Psych- euthymic mood, full affect Neuro- strength and sensation are intact  EKG- junctional rhythm at 46 bpm  Cath 2006- CONCLUSION:  1.  Well-preserved overall left ventricular function.  2.  Calcification of mid left anterior descending artery without critical    stenosis.   Echo-08/2017 - Left ventricle: The cavity size was normal. Wall thickness was   increased in a pattern of mild LVH. Systolic function was   vigorous. The estimated ejection fraction was in the range of 65%   to 70%. Left ventricular diastolic function parameters were   normal.attered luminal irregularities throughout the coronary system.   Assessment and Plan: 1. Paroxysmal afib  Recently quiet Felt weak today and ekg shows junctional rhythm at 46 bpm Appears  stable  Do not take pm metoprolol No driving until HR stabilizes, son drove her today  She will take her BP/HR in the am and report to office and she will be directed what  rate control to take in am, if any To ER if presyncopal/syncopal   CHA2DS2VASc score is at least 5   2. HTN Elevated  today but at home has been in stable range May need additional BP med if rate control is reduced    F/u with Oda Kilts, PA, Friday as already scheduled    Butch Penny C. [place], [facility] [address]  [place], [place] [address] [phone]

## 2020-03-14 NOTE — Patient Instructions (Signed)
HOLD metoprolol tonight  Call in morning with update of heart rate/blood pressure before taking morning medications.

## 2020-03-15 ENCOUNTER — Telehealth (HOSPITAL_COMMUNITY): Payer: Self-pay | Admitting: *Deleted

## 2020-03-15 NOTE — Telephone Encounter (Signed)
Patient called with update - feeling much better today - BP 142/66 HR 76. Per Roderic Palau NP take cardizem this morning hold metoprolol and call this afternoon with update and will make decision regarding metoprolol. Pt also reports she hasn't taken her iron recently. With drop in hemoglobin Butch Penny would like pt to contact Dr Hilarie Fredrickson who prescribed iron in past as to what dose she should be taking. Pt to call back this afternoon.

## 2020-03-15 NOTE — Progress Notes (Signed)
PCP:  Kamilya Wakeman Boston, MD Primary Cardiologist: No primary care provider on file. Electrophysiologist: Thompson Grayer, MD   Kayla Hendrix is a 69 y.o. female seen today for Thompson Grayer, MD for routine electrophysiology followup.  Since last being seen in our clinic the patient has been noted to have intermittent bradycardia. She was seen in AF clinic 5/5 for this with symptoms of fatigue and not feeling right. She expected to be back in AF with rapid HR based on her symptoms. She states her HR was back to normal by mid afternoon 5/5. She held lopressor evening of 5/5, and am of 5/6. She took her normal dose last night and this am.   She is not sure if she took an extra lopressor or not. She denies any recent illness, nausea, vomiting, or diarrhea. She did take a benadryl evening of 5/4. She feels her usual baseline today.   Past Medical History:  Diagnosis Date  . Allergy   . Anemia   . Anxiety   . Atrial fibrillation (HCC)    paroxysmal only asa 81 mg, no other blood thinner, the xarelto she was on gave her HAs  . Barrett's esophagus 2018  . CAD (coronary artery disease)    cath 2006- nonobstructive CAD  . Cataract   . Depression   . Diabetes mellitus without complication ([place])   . Gastric polyp   . GERD (gastroesophageal reflux disease)   . Glucose intolerance (impaired glucose tolerance)   . Hiatal hernia   . History of transesophageal echocardiography (TEE) for monitoring    TEE 3/17:  EF 55%, no RWMA, mild plaque in descending aorta, mild MR, mod BAE, normal RVF  . HL (hearing loss)   . HTN (hypertension)   . Hyperlipidemia   . Hyperplastic colon polyp   . Internal hemorrhoids   . Intestinal metaplasia of gastric mucosa   . Obesity   . OSA (obstructive sleep apnea)    noncompliant with CPAP  . Tubular adenoma of colon    Past Surgical History:  Procedure Laterality Date  . BIOPSY  10/17/2019   Procedure: BIOPSY;  Surgeon: Jerene Bears, MD;  Location: [facility];   Service: Gastroenterology;;  . CARDIOVERSION N/A 01/23/2016   Procedure: CARDIOVERSION;  Surgeon: Larey Dresser, MD;  Location: [facility];  Service: Cardiovascular;  Laterality: N/A;  . CARDIOVERSION N/A 12/07/2019   Procedure: CARDIOVERSION;  Surgeon: Pixie Casino, MD;  Location: [place];  Service: Cardiovascular;  Laterality: N/A;  . CESAREAN SECTION    . ESOPHAGOGASTRODUODENOSCOPY (EGD) WITH PROPOFOL N/A 10/17/2019   Procedure: ESOPHAGOGASTRODUODENOSCOPY (EGD) WITH PROPOFOL;  Surgeon: Jerene Bears, MD;  Location: [facility];  Service: Gastroenterology;  Laterality: N/A;  . GANGLION CYST EXCISION Left 1970s  . HEMOSTASIS CLIP PLACEMENT  10/17/2019   Procedure: HEMOSTASIS CLIP PLACEMENT;  Surgeon: Jerene Bears, MD;  Location: [facility];  Service: Gastroenterology;;  . POLYPECTOMY  10/17/2019   Procedure: POLYPECTOMY;  Surgeon: Jerene Bears, MD;  Location: [facility];  Service: Gastroenterology;;  . TEE WITHOUT CARDIOVERSION N/A 01/23/2016   Procedure: TRANSESOPHAGEAL ECHOCARDIOGRAM (TEE);  Surgeon: Larey Dresser, MD;  Location: [place];  Service: Cardiovascular;  Laterality: N/A;  . TOTAL ABDOMINAL HYSTERECTOMY    . WRIST FRACTURE SURGERY  2010   wrist fracture repair with metal rod    Current Outpatient Medications  Medication Sig Dispense Refill  . atorvastatin (LIPITOR) 40 MG tablet Take 40 mg by mouth at bedtime.    Marland Kitchen  clonazePAM (KLONOPIN) 0.5 MG tablet Taking 1 tablet daily    . diltiazem (TIAZAC) 360 MG 24 hr capsule Take 360 mg by mouth daily.    Marland Kitchen ELIQUIS 5 MG TABS tablet TAKE 1 TABLET BY MOUTH TWICE A DAY 60 tablet 10  . Ferrous Sulfate (IRON) 325 (65 Fe) MG TABS Take 325 mg by mouth daily.     Marland Kitchen gabapentin (NEURONTIN) 300 MG capsule Take 300 mg by mouth 2 (two) times daily.    Marland Kitchen glimepiride (AMARYL) 4 MG tablet Take 4 mg by mouth 2 (two) times daily.  99  . glucose blood (ACCU-CHEK AVIVA PLUS) test strip CHECK BLOOD SUGAR TWICE DAILY AS DIRECTED    .  hydrochlorothiazide (HYDRODIURIL) 25 MG tablet Take 25 mg by mouth daily.    . metFORMIN (GLUCOPHAGE) 500 MG tablet Take 1,000 mg by mouth 2 (two) times daily with a meal.     . metoprolol tartrate (LOPRESSOR) 50 MG tablet Take one tablet in the am and 1/2 tablet in the pm and take extra 1/2 tablet for break through a-fib 60 tablet 3  . omeprazole (PRILOSEC) 20 MG capsule Take 20 mg by mouth daily.     . sitaGLIPtin (JANUVIA) 100 MG tablet Take 100 mg by mouth daily.     No current facility-administered medications for this visit.    Allergies  Allergen Reactions  . Hydrocodone Itching    Social History   Socioeconomic History  . Marital status: Single    Spouse name: Not on file  . Number of children: Not on file  . Years of education: Not on file  . Highest education level: Not on file  Occupational History  . Not on file  Tobacco Use  . Smoking status: Former Smoker    Quit date: 2010    Years since quitting: 11.3  . Smokeless tobacco: Never Used  . Tobacco comment: 15-pack-year smoker  Substance and Sexual Activity  . Alcohol use: No    Comment: rarely  . Drug use: No  . Sexual activity: Not on file  Other Topics Concern  . Not on file  Social History Narrative   Pt. Lives in [place] with her son. 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    Social Determinants of Health   Financial Resource Strain:   . Difficulty of Paying Living Expenses:   Food Insecurity:   . Worried About Charity fundraiser in the Last Year:   . Arboriculturist in the Last Year:   Transportation Needs:   . Film/video editor (Medical):   Marland Kitchen Lack of Transportation (Non-Medical):   Physical Activity:   . Days of Exercise per Week:   . Minutes of Exercise per Session:   Stress:   . Feeling of Stress :   Social Connections:   . Frequency of Communication with Friends and Family:   . Frequency of Social  Gatherings with Friends and Family:   . Attends Religious Services:   . Active Member of Clubs or Organizations:   . Attends Archivist Meetings:   Marland Kitchen Marital Status:   Intimate Partner Violence:   . Fear of Current or Ex-Partner:   . Emotionally Abused:   Marland Kitchen Physically Abused:   . Sexually Abused:      Review of Systems: General: No chills, fever, night sweats or weight changes  Cardiovascular:  No chest pain,  dyspnea on exertion, edema, orthopnea, palpitations, paroxysmal nocturnal dyspnea Dermatological: No rash, lesions or masses Respiratory: No cough, dyspnea Urologic: No hematuria, dysuria Abdominal: No nausea, vomiting, diarrhea, bright red blood per rectum, melena, or hematemesis Neurologic: No visual changes, weakness, changes in mental status All other systems reviewed and are otherwise negative except as noted above.  Physical Exam: Vitals:   03/16/20 0946  BP: 132/62  Pulse: 66  SpO2: 97%  Weight: 231 lb (104.8 kg)  Height: 5\' 6"  (1.676 m)    GEN- The patient is well appearing, alert and oriented x 3 today.   HEENT: normocephalic, atraumatic; sclera clear, conjunctiva pink; hearing intact; oropharynx clear; neck supple, no JVP Lymph- no cervical lymphadenopathy Lungs- Clear to ausculation bilaterally, normal work of breathing.  No wheezes, rales, rhonchi Heart- Regular rate and rhythm, no murmurs, rubs or gallops, PMI not laterally displaced GI- soft, non-tender, non-distended, bowel sounds present, no hepatosplenomegaly Extremities- no clubbing, cyanosis, or edema; DP/PT/radial pulses 2+ bilaterally MS- no significant deformity or atrophy Skin- warm and dry, no rash or lesion Psych- euthymic mood, full affect Neuro- strength and sensation are intact  EKG is ordered. Personal review of EKG from today shows NSR at 66 bpm, QRS 100 ms, PR interval 192 ms  Additional studies reviewed include: EKG 5/5 showed marked sinus bradycardia in 40s.  Assessment  and Plan:  1. Persistent Atrial fibrillation Continue eliquis for CHA2DS2VASC of at least 4   Continue diltiazem 360 mg daily for now Continue lopressor 50 mg q am and 25 mg q pm for now.  Discussed the eventuality of PPM implantation for tachy/brady syndrome today. Pt would prefer to avoid this as long as possible.  2. Junctional bradycardia  Noted 03/14/2020 with AF clinic office visit.  Improved before her next due dose of lopressor. She is not sure if she took an extra tablet or not.   3. HTN Continue current regimen. Follow at home.   4. Obesity Body mass index is 37.28 kg/m.  Encouraged weight loss  5. Non obstructive CAD Denies ischemic symptoms  No medication changes today. We discussed proactively decreasing the lopressor and adding an additional HTN agent, and pt would prefer watchful waiting at this time. Unclear if she took extra BB as above. RTC 4 weeks. Pt knows to call sooner with recurrent symptoms.    Shirley Friar, PA-C  03/16/20 10:01 AM

## 2020-03-16 ENCOUNTER — Other Ambulatory Visit: Payer: Self-pay

## 2020-03-16 ENCOUNTER — Encounter: Payer: Self-pay | Admitting: Student

## 2020-03-16 ENCOUNTER — Ambulatory Visit (INDEPENDENT_AMBULATORY_CARE_PROVIDER_SITE_OTHER): Payer: No Typology Code available for payment source | Admitting: Student

## 2020-03-16 VITALS — BP 132/62 | HR 66 | Ht 66.0 in | Wt 231.0 lb

## 2020-03-16 DIAGNOSIS — R531 Weakness: Secondary | ICD-10-CM | POA: Diagnosis not present

## 2020-03-16 DIAGNOSIS — I10 Essential (primary) hypertension: Secondary | ICD-10-CM | POA: Diagnosis not present

## 2020-03-16 DIAGNOSIS — R001 Bradycardia, unspecified: Secondary | ICD-10-CM | POA: Diagnosis not present

## 2020-03-16 DIAGNOSIS — I4819 Other persistent atrial fibrillation: Secondary | ICD-10-CM

## 2020-03-16 NOTE — Patient Instructions (Addendum)
Medication Instructions:  none *If you need a refill on your cardiac medications before your next appointment, please call your pharmacy*   Lab Work: none If you have labs (blood work) drawn today and your tests are completely normal, you will receive your results only by: Marland Kitchen MyChart Message (if you have MyChart) OR . A paper copy in the mail If you have any lab test that is abnormal or we need to change your treatment, we will call you to review the results.   Testing/Procedures: none   Follow-Up: At [facility], you and your health needs are our priority.  As part of our continuing mission to provide you with exceptional heart care, we have created designated Provider Care Teams.  These Care Teams include your primary Cardiologist (physician) and Advanced Practice Providers (APPs -  Physician Assistants and Nurse Practitioners) who all work together to provide you with the care you need, when you need it.  Your next appointment:   1 month  The format for your next appointment:   In Person  Provider:   Oda Kilts, PA   Other Instructions

## 2020-03-22 ENCOUNTER — Telehealth: Payer: Self-pay | Admitting: *Deleted

## 2020-03-22 NOTE — Telephone Encounter (Signed)
Patient with diagnosis of ATRIAL FIBRILLATION on ELIQUIS for anticoagulation.    Procedure: LUMBAR ESI Date of procedure: 04/03/2020  CHADS2-VASc score of  4  HTN, AGE, DM2, female)  CrCl = 82ML/MIN  * No history of VTE/STROKE/TIA noted*  Per office protocol, patient can hold ELIQUIS for 3 days prior to procedure.

## 2020-03-22 NOTE — Telephone Encounter (Signed)
   [facility] Pre-operative Risk Assessment    Request for surgical clearance:  1. What type of surgery is being performed? LUMBAR ESI   2. When is this surgery scheduled? 04/03/20   3. What type of clearance is required (medical clearance vs. Pharmacy clearance to hold med vs. Both)? BOTH  4. Are there any medications that need to be held prior to surgery and how long? ELIQUIS X 3 DAYS PRIOR TO INJECTION   5. Practice name and name of physician performing surgery? [facility]; DR. Delfino Lovett RAMOS   6. What is your office phone number [phone]    7.   What is your office fax number [phone] ATTN: SUSAN  8.   Anesthesia type (None, local, MAC, general) ? NONE LISTED   [name] 03/22/2020, 10:56 AM  _________________________________________________________________   (provider comments below)

## 2020-03-22 NOTE — Telephone Encounter (Signed)
   Primary Cardiologist: No primary care provider on file.  Chart reviewed as part of pre-operative protocol coverage. Given past medical history and time since last visit, based on ACC/AHA guidelines, Kayla Hendrix would be at acceptable risk for the planned procedure without further cardiovascular testing.   Patient with diagnosis of ATRIAL FIBRILLATION on ELIQUIS for anticoagulation.    Procedure: LUMBAR ESI Date of procedure: 04/03/2020  CHADS2-VASc score of  4  HTN, AGE, DM2, female)  CrCl = 82ML/MIN  * No history of VTE/STROKE/TIA noted*  Per office protocol, patient can hold ELIQUIS for 3 days prior to procedure.  I will route this recommendation to the requesting party via Epic fax function and remove from pre-op pool.  Please call with questions.  Jossie Ng. Raun Routh NP-C    03/22/2020, 3:23 PM [facility] [place] [address] Office [phone] Fax [phone]

## 2020-04-06 ENCOUNTER — Telehealth (HOSPITAL_COMMUNITY): Payer: Self-pay | Admitting: Physician Assistant

## 2020-04-06 NOTE — Telephone Encounter (Signed)
Patient called AF clinic today noting symptoms of weakness and palpitations on the evening of 04/05/20 which have been typical of her afib symptoms in the past. She took a dose of metoprolol and her heart rates have been in the 80s with BP [id number] systolic. She has not been taking her BB on a regular basis. She does report she had an episode of hypoglycemia prior to the onset of her afib. Patient to take metoprolol 25 mg BID through the weekend and call the clinic 04/10/20 with update. She was off her anticoagulation 5/22-5/25 for a back injection.

## 2020-04-10 MED ORDER — METOPROLOL TARTRATE 50 MG PO TABS: ORAL_TABLET | ORAL | 3 refills | Status: DC

## 2020-04-10 NOTE — Telephone Encounter (Signed)
Patient called back in - states she did not take metoprolol on a regular basis over weekend as she is scared it will cause bradycardia as it did a few weeks back.  Per Roderic Palau NP will use metoprolol 25mg  only PRN for HR over 100. If still out of rhythm later this week will call back in to bring in next week.

## 2020-04-10 NOTE — Addendum Note (Signed)
Addended by: Juluis Mire on: 04/10/2020 04:31 PM   Modules accepted: Orders

## 2020-04-16 ENCOUNTER — Other Ambulatory Visit: Payer: Self-pay

## 2020-04-16 ENCOUNTER — Ambulatory Visit (HOSPITAL_COMMUNITY)
Admission: RE | Admit: 2020-04-16 | Discharge: 2020-04-16 | Disposition: A | Discharge status: Home / Self Care | Payer: No Typology Code available for payment source | Source: Ambulatory Visit | Attending: Nurse Practitioner | Admitting: Nurse Practitioner

## 2020-04-16 VITALS — BP 130/66 | HR 87 | Ht 66.0 in | Wt 229.4 lb

## 2020-04-16 DIAGNOSIS — I10 Essential (primary) hypertension: Secondary | ICD-10-CM | POA: Insufficient documentation

## 2020-04-16 DIAGNOSIS — E669 Obesity, unspecified: Secondary | ICD-10-CM | POA: Insufficient documentation

## 2020-04-16 DIAGNOSIS — F419 Anxiety disorder, unspecified: Secondary | ICD-10-CM | POA: Diagnosis not present

## 2020-04-16 DIAGNOSIS — E1136 Type 2 diabetes mellitus with diabetic cataract: Secondary | ICD-10-CM | POA: Insufficient documentation

## 2020-04-16 DIAGNOSIS — I2510 Atherosclerotic heart disease of native coronary artery without angina pectoris: Secondary | ICD-10-CM | POA: Insufficient documentation

## 2020-04-16 DIAGNOSIS — K219 Gastro-esophageal reflux disease without esophagitis: Secondary | ICD-10-CM | POA: Insufficient documentation

## 2020-04-16 DIAGNOSIS — I480 Paroxysmal atrial fibrillation: Secondary | ICD-10-CM | POA: Diagnosis present

## 2020-04-16 DIAGNOSIS — Z7984 Long term (current) use of oral hypoglycemic drugs: Secondary | ICD-10-CM | POA: Insufficient documentation

## 2020-04-16 DIAGNOSIS — Z79899 Other long term (current) drug therapy: Secondary | ICD-10-CM | POA: Diagnosis not present

## 2020-04-16 DIAGNOSIS — I4819 Other persistent atrial fibrillation: Secondary | ICD-10-CM

## 2020-04-16 DIAGNOSIS — Z87891 Personal history of nicotine dependence: Secondary | ICD-10-CM | POA: Diagnosis not present

## 2020-04-16 DIAGNOSIS — Z7901 Long term (current) use of anticoagulants: Secondary | ICD-10-CM | POA: Insufficient documentation

## 2020-04-16 DIAGNOSIS — Z885 Allergy status to narcotic agent status: Secondary | ICD-10-CM | POA: Insufficient documentation

## 2020-04-16 DIAGNOSIS — D6869 Other thrombophilia: Secondary | ICD-10-CM | POA: Diagnosis not present

## 2020-04-16 DIAGNOSIS — Z86010 Personal history of colonic polyps: Secondary | ICD-10-CM | POA: Diagnosis not present

## 2020-04-16 DIAGNOSIS — E785 Hyperlipidemia, unspecified: Secondary | ICD-10-CM | POA: Insufficient documentation

## 2020-04-16 DIAGNOSIS — Z8249 Family history of ischemic heart disease and other diseases of the circulatory system: Secondary | ICD-10-CM | POA: Insufficient documentation

## 2020-04-16 DIAGNOSIS — G4733 Obstructive sleep apnea (adult) (pediatric): Secondary | ICD-10-CM | POA: Insufficient documentation

## 2020-04-16 DIAGNOSIS — Z8719 Personal history of other diseases of the digestive system: Secondary | ICD-10-CM | POA: Insufficient documentation

## 2020-04-16 MED ORDER — METOPROLOL TARTRATE 25 MG PO TABS: 12.5000 mg | ORAL_TABLET | Freq: Two times a day (BID) | ORAL | 3 refills | Status: DC

## 2020-04-16 NOTE — Patient Instructions (Signed)
Start Metoprolol 12.5mg  twice a day (1/2 of the 25mg  tablet twice a day)    Cardioversion scheduled for Tuesday, June 22nd  - Arrive at the [facility] and go to admitting at [facility] not eat or drink anything after midnight the night prior to your procedure.  - Take all your morning medication (except diabetic medications) with a sip of water prior to arrival.  - You will not be able to drive home after your procedure.  - Do NOT miss any doses of your blood thinner - if you should miss a dose please notify our office immediately.

## 2020-04-17 ENCOUNTER — Other Ambulatory Visit (HOSPITAL_COMMUNITY): Payer: Self-pay | Admitting: *Deleted

## 2020-04-17 ENCOUNTER — Encounter (HOSPITAL_COMMUNITY): Payer: Self-pay | Admitting: Nurse Practitioner

## 2020-04-17 LAB — CBC
HCT: 38.1 % (ref 36.0–46.0)
Hemoglobin: 11.1 g/dL — ABNORMAL LOW (ref 12.0–15.0)
MCH: 23.2 pg — ABNORMAL LOW (ref 26.0–34.0)
MCHC: 29.1 g/dL — ABNORMAL LOW (ref 30.0–36.0)
MCV: 79.7 fL — ABNORMAL LOW (ref 80.0–100.0)
Platelets: 327 10*3/uL (ref 150–400)
RBC: 4.78 MIL/uL (ref 3.87–5.11)
RDW: 16.9 % — ABNORMAL HIGH (ref 11.5–15.5)
WBC: 9.1 10*3/uL (ref 4.0–10.5)
nRBC: 0 % (ref 0.0–0.2)

## 2020-04-17 LAB — BASIC METABOLIC PANEL
Anion gap: 13 (ref 5–15)
BUN: 14 mg/dL (ref 8–23)
CO2: 28 mmol/L (ref 22–32)
Calcium: 8.4 mg/dL — ABNORMAL LOW (ref 8.9–10.3)
Chloride: 99 mmol/L (ref 98–111)
Creatinine, Ser: 0.98 mg/dL (ref 0.44–1.00)
GFR calc Af Amer: >60 mL/min (ref >60)
GFR calc non Af Amer: 59 mL/min — ABNORMAL LOW (ref >60)
Glucose, Bld: 65 mg/dL — ABNORMAL LOW (ref 70–99)
Potassium: 4.2 mmol/L (ref 3.5–5.1)
Sodium: 140 mmol/L (ref 135–145)

## 2020-04-17 NOTE — Progress Notes (Signed)
Primary Care Physician: [name], MD Referring Physician: Dr. [name] is a 69 y.o. female with a h/o paroxysmal afib that is in the afib clinic 11/29/19  for persistent afib that had been present since the  previous  Sunday. She was scheduled for a cardioversion in 2019 but converted herself and DCCV was cancelled. Last  cardioversion was 2017. This  is the first afib that she  has had since 2019 that persisted for more than a few minutes.  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 She feels the stress from that may have been  her trigger. No missed doses of eliquis for the last 3 weeks, CHA2DS2VASc score of 4. Has OSA but intolerant to cpap.  F/u in afib clinic, 12/30/19. She had successful cardioversion 12/07/19 and continues in SR. She feels improved.   Asked to be seen today as she felt weak today. EKG shows junctional rhythm at 46 bpm. She does not remember taking any extra medicine by mistake. She  felt well yesterday.   F/u in afib clinic, 04/16/20 as pt went into afib last week. She  had an back injection the end of May and had to be off anticoagulation x 4 days. She restarted eliquis  5/26. She  is rate controlled today but feels fatigued. She has not taken BB on a regular basis since she had a day of bradycardia, she is taking prn.   Today, she denies symptoms of palpitations, chest pain, shortness of breath, orthopnea, PND, lower extremity edema, dizziness, presyncope, syncope, or neurologic sequela. The patient is tolerating medications without difficulties and is otherwise without complaint today.   Past Medical History:  Diagnosis Date  . Allergy   . Anemia   . Anxiety   . Atrial fibrillation (HCC)    paroxysmal only asa 81 mg, no other blood thinner, the xarelto she was on gave her HAs  . Barrett's esophagus 2018  . CAD (coronary artery disease)    cath 2006- nonobstructive CAD  . Cataract   . Depression   .  Diabetes mellitus without complication ([place])   . Gastric polyp   . GERD (gastroesophageal reflux disease)   . Glucose intolerance (impaired glucose tolerance)   . Hiatal hernia   . History of transesophageal echocardiography (TEE) for monitoring    TEE 3/17:  EF 55%, no RWMA, mild plaque in descending aorta, mild MR, mod BAE, normal RVF  . HL (hearing loss)   . HTN (hypertension)   . Hyperlipidemia   . Hyperplastic colon polyp   . Internal hemorrhoids   . Intestinal metaplasia of gastric mucosa   . Obesity   . OSA (obstructive sleep apnea)    noncompliant with CPAP  . Tubular adenoma of colon    Past Surgical History:  Procedure Laterality Date  . BIOPSY  10/17/2019   Procedure: BIOPSY;  Surgeon: Jerene Bears, MD;  Location: [facility];  Service: Gastroenterology;;  . CARDIOVERSION N/A 01/23/2016   Procedure: CARDIOVERSION;  Surgeon: Larey Dresser, MD;  Location: [facility] ENDOSCOPY;  Service: Cardiovascular;  Laterality: N/A;  . CARDIOVERSION N/A 12/07/2019   Procedure: CARDIOVERSION;  Surgeon: Pixie Casino, MD;  Location: [place];  Service: Cardiovascular;  Laterality: N/A;  . CESAREAN SECTION    . ESOPHAGOGASTRODUODENOSCOPY (EGD) WITH PROPOFOL N/A 10/17/2019   Procedure: ESOPHAGOGASTRODUODENOSCOPY (EGD) WITH PROPOFOL;  Surgeon: Jerene Bears, MD;  Location: [facility];  Service: Gastroenterology;  Laterality: N/A;  . GANGLION CYST EXCISION Left 1970s  . HEMOSTASIS CLIP PLACEMENT  10/17/2019   Procedure: HEMOSTASIS CLIP PLACEMENT;  Surgeon: Jerene Bears, MD;  Location: [facility];  Service: Gastroenterology;;  . POLYPECTOMY  10/17/2019   Procedure: POLYPECTOMY;  Surgeon: Jerene Bears, MD;  Location: [facility];  Service: Gastroenterology;;  . TEE WITHOUT CARDIOVERSION N/A 01/23/2016   Procedure: TRANSESOPHAGEAL ECHOCARDIOGRAM (TEE);  Surgeon: Larey Dresser, MD;  Location: [place];  Service: Cardiovascular;  Laterality: N/A;  . TOTAL ABDOMINAL HYSTERECTOMY    .  WRIST FRACTURE SURGERY  2010   wrist fracture repair with metal rod    Current Outpatient Medications  Medication Sig Dispense Refill  . atorvastatin (LIPITOR) 40 MG tablet Take 40 mg by mouth at bedtime.    . clonazePAM (KLONOPIN) 0.5 MG tablet Taking 1 tablet daily    . diltiazem (TIAZAC) 360 MG 24 hr capsule Take 360 mg by mouth daily.    Marland Kitchen ELIQUIS 5 MG TABS tablet TAKE 1 TABLET BY MOUTH TWICE A DAY 60 tablet 10  . Ferrous Sulfate (IRON) 325 (65 Fe) MG TABS Take 325 mg by mouth daily.     Marland Kitchen gabapentin (NEURONTIN) 300 MG capsule Take 300 mg by mouth 2 (two) times daily.    Marland Kitchen glimepiride (AMARYL) 4 MG tablet Take 4 mg by mouth 2 (two) times daily.  99  . glucose blood (ACCU-CHEK AVIVA PLUS) test strip CHECK BLOOD SUGAR TWICE DAILY AS DIRECTED    . hydrochlorothiazide (HYDRODIURIL) 25 MG tablet Take 25 mg by mouth daily.    . metFORMIN (GLUCOPHAGE) 500 MG tablet Take 1,000 mg by mouth 2 (two) times daily with a meal.     . metoprolol tartrate (LOPRESSOR) 25 MG tablet Take 0.5 tablets (12.5 mg total) by mouth 2 (two) times daily. 30 tablet 3  . Multiple Vitamin (MULTIVITAMIN) tablet Takes one chewable by mouth daily- GOLI medication    . omeprazole (PRILOSEC) 20 MG capsule Take 20 mg by mouth daily.     . Psyllium (NATURAL POWDER PO) Take by mouth. Super Beets- powder added to a glass of water- takes every day    . sitaGLIPtin (JANUVIA) 100 MG tablet Take 100 mg by mouth daily.     No current facility-administered medications for this encounter.    Allergies  Allergen Reactions  . Hydrocodone Itching    Social History   Socioeconomic History  . Marital status: Single    Spouse name: Not on file  . Number of children: Not on file  . Years of education: Not on file  . Highest education level: Not on file  Occupational History  . Not on file  Tobacco Use  . Smoking status: Former Smoker    Quit date: 2010    Years since quitting: 11.4  . Smokeless tobacco: Never Used  .  Tobacco comment: 15-pack-year smoker  Substance and Sexual Activity  . Alcohol use: No    Comment: rarely  . Drug use: No  . Sexual activity: Not on file  Other Topics Concern  . Not on file  Social History Narrative   Pt. Lives in [place] with her son. 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    Social Determinants of Health   Financial Resource Strain:   . Difficulty of Paying Living Expenses:   Food Insecurity:   . Worried About Running  Out of Food in the Last Year:   . [place] in the Last Year:   Transportation Needs:   . Lack of Transportation (Medical):   Marland Kitchen Lack of Transportation (Non-Medical):   Physical Activity:   . Days of Exercise per Week:   . Minutes of Exercise per Session:   Stress:   . Feeling of Stress :   Social Connections:   . Frequency of Communication with Friends and Family:   . Frequency of Social Gatherings with Friends and Family:   . Attends Religious Services:   . Active Member of Clubs or Organizations:   . Attends Archivist Meetings:   Marland Kitchen Marital Status:   Intimate Partner Violence:   . Fear of Current or Ex-Partner:   . Emotionally Abused:   Marland Kitchen Physically Abused:   . Sexually Abused:     Family History  Problem Relation Age of Onset  . Cancer Father   . Stomach cancer Father   . Diabetes Other        noninsulin dependant DM  . Hypertension Other   . Stomach cancer Maternal Grandmother   . Colon cancer Neg Hx   . Colon polyps Neg Hx   . Esophageal cancer Neg Hx   . Rectal cancer Neg Hx     ROS- All systems are reviewed and negative except as per the HPI above  Physical Exam: Vitals:   04/16/20 1537  BP: 130/66  Pulse: 87  Weight: 104.1 kg  Height: 5\' 6"  (1.676 m)   Wt Readings from Last 3 Encounters:  04/16/20 104.1 kg  03/16/20 104.8 kg  03/14/20 105.8 kg    Labs: Lab Results  Component Value Date   NA 139 03/14/2020    K 4.4 03/14/2020   CL 100 03/14/2020   CO2 26 03/14/2020   GLUCOSE 131 (H) 03/14/2020   BUN 14 03/14/2020   CREATININE 1.08 (H) 03/14/2020   CALCIUM 8.4 (L) 03/14/2020   MG 1.7 01/02/2016   Lab Results  Component Value Date   INR 1.0 04/08/2012   Lab Results  Component Value Date   CHOL 159 04/10/2012   HDL 34 (L) 04/10/2012   LDLCALC 79 04/10/2012   TRIG 230 (H) 04/10/2012     GEN- The patient is well appearing, alert and oriented x 3 today.   Head- normocephalic, atraumatic Eyes-  Sclera clear, conjunctiva pink Ears- hearing intact Oropharynx- clear Neck- supple, no JVP Lymph- no cervical lymphadenopathy Lungs- Clear to ausculation bilaterally, normal work of breathing Heart-irregular rate and rhythm, no murmurs, rubs or gallops, PMI not laterally displaced GI- soft, NT, ND, + BS Extremities- no clubbing, cyanosis, or edema MS- no significant deformity or atrophy Skin- no rash or lesion Psych- euthymic mood, full affect Neuro- strength and sensation are intact  EKG- afib at 87 bpm, qrs int 96 ms, qtc 500 ms   Cath 2006- CONCLUSION:  1.  Well-preserved overall left ventricular function.  2.  Calcification of mid left anterior descending artery without critical    stenosis.   Echo-08/2017 - Left ventricle: The cavity size was normal. Wall thickness was   increased in a pattern of mild LVH. Systolic function was   vigorous. The estimated ejection fraction was in the range of 65%   to 70%. Left ventricular diastolic function parameters were   normal.attered luminal irregularities throughout the coronary system.   Assessment and Plan: 1. Paroxysmal afib  Present  since last week Trigger may have been  back injection  Restarted anticoagulation 5/26 Will plan on cardioversion 6/22 Add metoprolol 12.5 mg bid    CHA2DS2VASc score is at least 5, reminded not to miss any  eliquis 5 mg bid  bmet/cbc   2. HTN Stable   Will see back one week after  cardioversion   Kayla Hendrix, [facility] [address] [place], [place] [address] [phone]

## 2020-04-18 ENCOUNTER — Other Ambulatory Visit (HOSPITAL_COMMUNITY): Payer: Self-pay | Admitting: Nurse Practitioner

## 2020-04-23 ENCOUNTER — Ambulatory Visit: Payer: No Typology Code available for payment source | Admitting: Student

## 2020-04-27 ENCOUNTER — Other Ambulatory Visit: Payer: Self-pay

## 2020-04-27 ENCOUNTER — Ambulatory Visit (HOSPITAL_COMMUNITY)
Admission: RE | Admit: 2020-04-27 | Discharge: 2020-04-27 | Disposition: A | Discharge status: Home / Self Care | Payer: No Typology Code available for payment source | Source: Ambulatory Visit | Attending: Nurse Practitioner | Admitting: Nurse Practitioner

## 2020-04-27 DIAGNOSIS — I4891 Unspecified atrial fibrillation: Secondary | ICD-10-CM | POA: Diagnosis present

## 2020-04-28 ENCOUNTER — Other Ambulatory Visit (HOSPITAL_COMMUNITY): Payer: No Typology Code available for payment source

## 2020-04-28 NOTE — Progress Notes (Signed)
LVM to inquire if she is still having her cardioversion procedure on Tues 6/22, as her covid testing appt was cancelled. Advised ont eh message that she still needs testing prior to the procedure, and to call back with an update.

## 2020-04-30 ENCOUNTER — Other Ambulatory Visit (HOSPITAL_COMMUNITY): Payer: Self-pay | Admitting: *Deleted

## 2020-04-30 ENCOUNTER — Encounter (HOSPITAL_COMMUNITY): Payer: Self-pay

## 2020-04-30 NOTE — Progress Notes (Signed)
F/u ekg as pt was scheduled for cardioversion and thought she may have converted. EKG showed NSR. DCCV cancelled. F/u in 3 months.

## 2020-05-01 ENCOUNTER — Ambulatory Visit (HOSPITAL_COMMUNITY)
Admission: RE | Admit: 2020-05-01 | Payer: No Typology Code available for payment source | Source: Home / Self Care | Admitting: Cardiology

## 2020-05-01 ENCOUNTER — Encounter (HOSPITAL_COMMUNITY): Admission: RE | Payer: Self-pay | Source: Home / Self Care

## 2020-05-01 SURGERY — CARDIOVERSION
Anesthesia: Monitor Anesthesia Care

## 2020-05-08 ENCOUNTER — Ambulatory Visit (HOSPITAL_COMMUNITY): Payer: No Typology Code available for payment source | Admitting: Nurse Practitioner

## 2020-05-11 ENCOUNTER — Other Ambulatory Visit: Payer: Self-pay | Admitting: Internal Medicine

## 2020-05-11 DIAGNOSIS — Z13820 Encounter for screening for osteoporosis: Secondary | ICD-10-CM

## 2020-05-23 ENCOUNTER — Telehealth (HOSPITAL_COMMUNITY): Payer: Self-pay | Admitting: *Deleted

## 2020-05-23 NOTE — Telephone Encounter (Signed)
Patient called in stating she has been going in and out of afib since Saturday. She did take an extra quarter of metoprolol this morning and it helped settle things down. She will continue to monitor - if frequency and duration of breakthrough increases will call to be seen to discuss AAD. Had previously discussed tikosyn but afib had been quiet for over a year so held off.

## 2020-07-14 ENCOUNTER — Other Ambulatory Visit: Payer: Self-pay | Admitting: Internal Medicine

## 2020-07-14 DIAGNOSIS — I480 Paroxysmal atrial fibrillation: Secondary | ICD-10-CM

## 2020-07-17 NOTE — Telephone Encounter (Signed)
Prescription refill request for Eliquis received. Indication: afib Last office visit:04/16/20 Scr: 0.98 on 04/17/20 Age: 69 Weight:104 kg

## 2020-08-09 ENCOUNTER — Ambulatory Visit (HOSPITAL_COMMUNITY)
Admission: RE | Admit: 2020-08-09 | Discharge: 2020-08-09 | Disposition: A | Discharge status: Home / Self Care | Payer: No Typology Code available for payment source | Source: Ambulatory Visit | Attending: Nurse Practitioner | Admitting: Nurse Practitioner

## 2020-08-09 ENCOUNTER — Other Ambulatory Visit: Payer: Self-pay

## 2020-08-09 ENCOUNTER — Encounter (HOSPITAL_COMMUNITY): Payer: Self-pay | Admitting: Nurse Practitioner

## 2020-08-09 VITALS — BP 148/68 | HR 132 | Ht 66.0 in | Wt 237.8 lb

## 2020-08-09 DIAGNOSIS — D6869 Other thrombophilia: Secondary | ICD-10-CM

## 2020-08-09 DIAGNOSIS — Z7984 Long term (current) use of oral hypoglycemic drugs: Secondary | ICD-10-CM | POA: Diagnosis not present

## 2020-08-09 DIAGNOSIS — E1136 Type 2 diabetes mellitus with diabetic cataract: Secondary | ICD-10-CM | POA: Insufficient documentation

## 2020-08-09 DIAGNOSIS — R0602 Shortness of breath: Secondary | ICD-10-CM | POA: Insufficient documentation

## 2020-08-09 DIAGNOSIS — E785 Hyperlipidemia, unspecified: Secondary | ICD-10-CM | POA: Insufficient documentation

## 2020-08-09 DIAGNOSIS — F419 Anxiety disorder, unspecified: Secondary | ICD-10-CM | POA: Diagnosis not present

## 2020-08-09 DIAGNOSIS — I480 Paroxysmal atrial fibrillation: Secondary | ICD-10-CM | POA: Insufficient documentation

## 2020-08-09 DIAGNOSIS — I4819 Other persistent atrial fibrillation: Secondary | ICD-10-CM

## 2020-08-09 DIAGNOSIS — Z79899 Other long term (current) drug therapy: Secondary | ICD-10-CM | POA: Insufficient documentation

## 2020-08-09 DIAGNOSIS — Z9119 Patient's noncompliance with other medical treatment and regimen: Secondary | ICD-10-CM | POA: Insufficient documentation

## 2020-08-09 DIAGNOSIS — E669 Obesity, unspecified: Secondary | ICD-10-CM | POA: Diagnosis not present

## 2020-08-09 DIAGNOSIS — I10 Essential (primary) hypertension: Secondary | ICD-10-CM | POA: Diagnosis not present

## 2020-08-09 DIAGNOSIS — I2510 Atherosclerotic heart disease of native coronary artery without angina pectoris: Secondary | ICD-10-CM | POA: Diagnosis not present

## 2020-08-09 DIAGNOSIS — Z7901 Long term (current) use of anticoagulants: Secondary | ICD-10-CM | POA: Insufficient documentation

## 2020-08-09 DIAGNOSIS — I440 Atrioventricular block, first degree: Secondary | ICD-10-CM | POA: Insufficient documentation

## 2020-08-09 DIAGNOSIS — Z87891 Personal history of nicotine dependence: Secondary | ICD-10-CM | POA: Insufficient documentation

## 2020-08-09 DIAGNOSIS — K219 Gastro-esophageal reflux disease without esophagitis: Secondary | ICD-10-CM | POA: Insufficient documentation

## 2020-08-09 DIAGNOSIS — G4733 Obstructive sleep apnea (adult) (pediatric): Secondary | ICD-10-CM | POA: Insufficient documentation

## 2020-08-09 LAB — CBC
HCT: 33.9 % — ABNORMAL LOW (ref 36.0–46.0)
Hemoglobin: 9.8 g/dL — ABNORMAL LOW (ref 12.0–15.0)
MCH: 22.1 pg — ABNORMAL LOW (ref 26.0–34.0)
MCHC: 28.9 g/dL — ABNORMAL LOW (ref 30.0–36.0)
MCV: 76.5 fL — ABNORMAL LOW (ref 80.0–100.0)
Platelets: 284 10*3/uL (ref 150–400)
RBC: 4.43 MIL/uL (ref 3.87–5.11)
RDW: 16 % — ABNORMAL HIGH (ref 11.5–15.5)
WBC: 8 10*3/uL (ref 4.0–10.5)
nRBC: 0 % (ref 0.0–0.2)

## 2020-08-09 LAB — BASIC METABOLIC PANEL
Anion gap: 12 (ref 5–15)
BUN: 9 mg/dL (ref 8–23)
CO2: 27 mmol/L (ref 22–32)
Calcium: 8.6 mg/dL — ABNORMAL LOW (ref 8.9–10.3)
Chloride: 99 mmol/L (ref 98–111)
Creatinine, Ser: 0.86 mg/dL (ref 0.44–1.00)
GFR calc Af Amer: >60 mL/min (ref >60)
GFR calc non Af Amer: >60 mL/min (ref >60)
Glucose, Bld: 110 mg/dL — ABNORMAL HIGH (ref 70–99)
Potassium: 4.8 mmol/L (ref 3.5–5.1)
Sodium: 138 mmol/L (ref 135–145)

## 2020-08-09 MED ORDER — FUROSEMIDE 20 MG PO TABS
20.0000 mg | ORAL_TABLET | Freq: Every day | ORAL | 0 refills | Status: DC | PRN
Start: 2020-08-09 — End: 2020-09-18

## 2020-08-09 NOTE — Progress Notes (Signed)
Primary Care Physician: [name], MD Referring Physician: [name] is a 69 y.o. female with a h/o paroxysmal afib that is in the afib clinic 11/29/19  for persistent afib that had been present since the  previous  Sunday. She was scheduled for a cardioversion in 2019 but converted herself and DCCV was cancelled. Last  cardioversion was 2017. This  is the first afib that she  has had since 2019 that persisted for more than a few minutes.  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 She feels the stress from that may have been  her trigger. No missed doses of eliquis for the last 3 weeks, CHA2DS2VASc score of 4. Has OSA but intolerant to cpap.  F/u in afib clinic, 12/30/19. She had successful cardioversion 12/07/19 and continues in SR. She feels improved.   Asked to be seen today as she felt weak today. EKG shows junctional rhythm at 46 bpm. She does not remember taking any extra medicine by mistake. She  felt well yesterday.   F/u in afib clinic, 04/16/20 as pt went into afib last week. She  had an back injection the end of May and had to be off anticoagulation x 4 days. She restarted eliquis  5/26. She  is rate controlled today but feels fatigued. She has not taken BB on a regular basis since she had a day of bradycardia, she is taking prn.   F/u in afib clinic, 08/09/20. Pt asked to be seen today for ongoing afib x 3 weeks. She has afib with v rates in the 130's today. She feels short of breath with exertion and is fatigued. In June she went into afib and was set up for cardioversion but she self converted. She was waiting to see if she could convert on her own.   Today, she denies symptoms of palpitations, chest pain, shortness of breath, orthopnea, PND, lower extremity edema, dizziness, presyncope, syncope, or neurologic sequela. The patient is tolerating medications without difficulties and is otherwise without complaint today.   Past  Medical History:  Diagnosis Date  . Allergy   . Anemia   . Anxiety   . Atrial fibrillation (HCC)    paroxysmal only asa 81 mg, no other blood thinner, the xarelto she was on gave her HAs  . Barrett's esophagus 2018  . CAD (coronary artery disease)    cath 2006- nonobstructive CAD  . Cataract   . Depression   . Diabetes mellitus without complication ([place])   . Gastric polyp   . GERD (gastroesophageal reflux disease)   . Glucose intolerance (impaired glucose tolerance)   . Hiatal hernia   . History of transesophageal echocardiography (TEE) for monitoring    TEE 3/17:  EF 55%, no RWMA, mild plaque in descending aorta, mild MR, mod BAE, normal RVF  . HL (hearing loss)   . HTN (hypertension)   . Hyperlipidemia   . Hyperplastic colon polyp   . Internal hemorrhoids   . Intestinal metaplasia of gastric mucosa   . Obesity   . OSA (obstructive sleep apnea)    noncompliant with CPAP  . Tubular adenoma of colon    Past Surgical History:  Procedure Laterality Date  . BIOPSY  10/17/2019   Procedure: BIOPSY;  Surgeon: Jerene Bears, MD;  Location: [facility];  Service: Gastroenterology;;  . CARDIOVERSION N/A 01/23/2016   Procedure: CARDIOVERSION;  Surgeon: Larey Dresser,  MD;  Location: [place];  Service: Cardiovascular;  Laterality: N/A;  . CARDIOVERSION N/A 12/07/2019   Procedure: CARDIOVERSION;  Surgeon: Pixie Casino, MD;  Location: [facility];  Service: Cardiovascular;  Laterality: N/A;  . CESAREAN SECTION    . ESOPHAGOGASTRODUODENOSCOPY (EGD) WITH PROPOFOL N/A 10/17/2019   Procedure: ESOPHAGOGASTRODUODENOSCOPY (EGD) WITH PROPOFOL;  Surgeon: Jerene Bears, MD;  Location: [facility];  Service: Gastroenterology;  Laterality: N/A;  . GANGLION CYST EXCISION Left 1970s  . HEMOSTASIS CLIP PLACEMENT  10/17/2019   Procedure: HEMOSTASIS CLIP PLACEMENT;  Surgeon: Jerene Bears, MD;  Location: [facility];  Service: Gastroenterology;;  . POLYPECTOMY  10/17/2019   Procedure:  POLYPECTOMY;  Surgeon: Jerene Bears, MD;  Location: [facility];  Service: Gastroenterology;;  . TEE WITHOUT CARDIOVERSION N/A 01/23/2016   Procedure: TRANSESOPHAGEAL ECHOCARDIOGRAM (TEE);  Surgeon: Larey Dresser, MD;  Location: [place];  Service: Cardiovascular;  Laterality: N/A;  . TOTAL ABDOMINAL HYSTERECTOMY    . WRIST FRACTURE SURGERY  2010   wrist fracture repair with metal rod    Current Outpatient Medications  Medication Sig Dispense Refill  . atorvastatin (LIPITOR) 40 MG tablet Take 40 mg by mouth at bedtime.    . clonazePAM (KLONOPIN) 0.5 MG tablet Taking 1 tablet daily    . diltiazem (TIAZAC) 360 MG 24 hr capsule Take 360 mg by mouth daily.    Marland Kitchen ELIQUIS 5 MG TABS tablet TAKE 1 TABLET BY MOUTH TWICE A DAY 60 tablet 5  . Ferrous Sulfate (IRON) 325 (65 Fe) MG TABS Take 325 mg by mouth daily.     Marland Kitchen gabapentin (NEURONTIN) 300 MG capsule Take 300 mg by mouth 2 (two) times daily.    Marland Kitchen glimepiride (AMARYL) 4 MG tablet Take 4 mg by mouth 2 (two) times daily.  99  . glucose blood (ACCU-CHEK AVIVA PLUS) test strip CHECK BLOOD SUGAR TWICE DAILY AS DIRECTED    . hydrochlorothiazide (HYDRODIURIL) 25 MG tablet Take 25 mg by mouth daily.    . metFORMIN (GLUCOPHAGE) 500 MG tablet Take 1,000 mg by mouth 2 (two) times daily with a meal.     . metoprolol tartrate (LOPRESSOR) 25 MG tablet Take 0.5 tablets (12.5 mg total) by mouth 2 (two) times daily. 30 tablet 3  . omeprazole (PRILOSEC) 20 MG capsule Take 20 mg by mouth daily.     . Psyllium (NATURAL POWDER PO) Take by mouth. Super Beets- powder added to a glass of water- takes every day    . sitaGLIPtin (JANUVIA) 100 MG tablet Take 100 mg by mouth daily.    . Multiple Vitamin (MULTIVITAMIN) tablet Takes one chewable by mouth daily- GOLI medication (Patient not taking: Reported on 08/09/2020)     No current facility-administered medications for this encounter.    Allergies  Allergen Reactions  . Hydrocodone Itching    Social History    Socioeconomic History  . Marital status: Single    Spouse name: Not on file  . Number of children: Not on file  . Years of education: Not on file  . Highest education level: Not on file  Occupational History  . Not on file  Tobacco Use  . Smoking status: Former Smoker    Quit date: 2010    Years since quitting: 11.7  . Smokeless tobacco: Never Used  . Tobacco comment: 15-pack-year smoker  Vaping Use  . Vaping Use: Never used  Substance and Sexual Activity  . Alcohol use: No    Comment: rarely  . Drug  use: No  . Sexual activity: Not on file  Other Topics Concern  . Not on file  Social History Narrative   Pt. Lives in [place] with her son. 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    Social Determinants of Health   Financial Resource Strain:   . Difficulty of Paying Living Expenses: Not on file  Food Insecurity:   . Worried About Charity fundraiser in the Last Year: Not on file  . Ran Out of Food in the Last Year: Not on file  Transportation Needs:   . Lack of Transportation (Medical): Not on file  . Lack of Transportation (Non-Medical): Not on file  Physical Activity:   . Days of Exercise per Week: Not on file  . Minutes of Exercise per Session: Not on file  Stress:   . Feeling of Stress : Not on file  Social Connections:   . Frequency of Communication with Friends and Family: Not on file  . Frequency of Social Gatherings with Friends and Family: Not on file  . Attends Religious Services: Not on file  . Active Member of Clubs or Organizations: Not on file  . Attends Archivist Meetings: Not on file  . Marital Status: Not on file  Intimate Partner Violence:   . Fear of Current or Ex-Partner: Not on file  . Emotionally Abused: Not on file  . Physically Abused: Not on file  . Sexually Abused: Not on file    Family History  Problem Relation Age of Onset  . Cancer Father    . Stomach cancer Father   . Diabetes Other        noninsulin dependant DM  . Hypertension Other   . Stomach cancer Maternal Grandmother   . Colon cancer Neg Hx   . Colon polyps Neg Hx   . Esophageal cancer Neg Hx   . Rectal cancer Neg Hx     ROS- All systems are reviewed and negative except as per the HPI above  Physical Exam: Vitals:   08/09/20 1333  BP: (!) 148/68  Pulse: (!) 132  Weight: 107.9 kg  Height: 5\' 6"  (1.676 m)   Wt Readings from Last 3 Encounters:  08/09/20 107.9 kg  04/16/20 104.1 kg  03/16/20 104.8 kg    Labs: Lab Results  Component Value Date   NA 140 04/17/2020   K 4.2 04/17/2020   CL 99 04/17/2020   CO2 28 04/17/2020   GLUCOSE 65 (L) 04/17/2020   BUN 14 04/17/2020   CREATININE 0.98 04/17/2020   CALCIUM 8.4 (L) 04/17/2020   MG 1.7 01/02/2016   Lab Results  Component Value Date   INR 1.0 04/08/2012   Lab Results  Component Value Date   CHOL 159 04/10/2012   HDL 34 (L) 04/10/2012   LDLCALC 79 04/10/2012   TRIG 230 (H) 04/10/2012     GEN- The patient is well appearing, alert and oriented x 3 today.   Head- normocephalic, atraumatic Eyes-  Sclera clear, conjunctiva pink Ears- hearing intact Oropharynx- clear Neck- supple, no JVP Lymph- no cervical lymphadenopathy Lungs- Clear to ausculation bilaterally, normal work of breathing Heart-irregular rate and rhythm, no murmurs, rubs or gallops, PMI not laterally displaced GI- soft, NT, ND, + BS Extremities- no clubbing, cyanosis, or edema MS- no significant deformity or atrophy Skin- no rash or lesion Psych- euthymic mood, full affect Neuro- strength and sensation  are intact  EKG-  afib at 132 bpm, qrs int 94 ms, qtc 500 ms   Cath 2006- CONCLUSION:  1.  Well-preserved overall left ventricular function.  2.  Calcification of mid left anterior descending artery without critical    stenosis.   Echo-08/2017 - Left ventricle: The cavity size was normal. Wall thickness was   increased  in a pattern of mild LVH. Systolic function was   vigorous. The estimated ejection fraction was in the range of 65%   to 70%. Left ventricular diastolic function parameters were   normal.attered luminal irregularities throughout the coronary system.   Assessment and Plan: 1. Paroxysmal afib  Present x 3 weeks  Pt is symptomatic with fatigue and exertional shortness of breath Her weight is up 8 lbs since June, will hold hctz x 3 days and she will take lasix 20 mg x those 3 days then return to Hctz daily Will plan on cardioversion  10/6 Increase metoprolol to 25 mg bid, but return to 12.5 mg bid on day of cardioversion as she can get bradycardic at times in SR     CHA2DS2VASc score is at least 5,  no missed doses x 3 weeks  Bmet/cbc/covid testing Has had vaccines   She will be put out of work until after cardioversion  She is willing to consider tikosyn, will put  her on wait list for this when covid restrictions are lifted for elective procedures  She has first degree AV block and IRBBB so do not think flecainide would be best option, Bradycardia may limit Multaq , on young side for amiodarone     2. HTN Stable   Will see back one week after cardioversion   Butch Penny C. Binnie Droessler, [facility] [address] [place], [place] [address] [phone]

## 2020-08-09 NOTE — Patient Instructions (Addendum)
Cardioversion scheduled for Wednesday, October 6th  - Arrive at the [facility] and go to admitting at [facility] not eat or drink anything after midnight the night prior to your procedure.  - Take all your morning medication (except diabetic medications) with a sip of water prior to arrival.  - You will not be able to drive home after your procedure.  - Do NOT miss any doses of your blood thinner - if you should miss a dose please notify our office immediately.  - If you feel as if you go back into normal rhythm prior to scheduled cardioversion, please notify our office immediately. If your procedure is canceled in the cardioversion suite you will be charged a cancellation fee.  Until day of cardioversion increase metoprolol to 25mg  twice a day then go back to 12.5mg  twice a day  Hold HCTZ for the next 3 days -- and take lasix 20mg  once a day for 3 days- then return back to HCTZ

## 2020-08-09 NOTE — H&P (View-Only) (Signed)
Primary Care Physician: Kayla Boston, MD Referring Physician: Dr. [name] is a 69 y.o. female with a h/o paroxysmal afib that is in the afib clinic 11/29/19  for persistent afib that had been present since the  previous  Sunday. She was scheduled for a cardioversion in 2019 but converted herself and DCCV was cancelled. Last  cardioversion was 2017. This  is the first afib that she  has had since 2019 that persisted for more than a few minutes.  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 She feels the stress from that may have been  her trigger. No missed doses of eliquis for the last 3 weeks, CHA2DS2VASc score of 4. Has OSA but intolerant to cpap.  F/u in afib clinic, 12/30/19. She had successful cardioversion 12/07/19 and continues in SR. She feels improved.   Asked to be seen today as she felt weak today. EKG shows junctional rhythm at 46 bpm. She does not remember taking any extra medicine by mistake. She  felt well yesterday.   F/u in afib clinic, 04/16/20 as pt went into afib last week. She  had an back injection the end of May and had to be off anticoagulation x 4 days. She restarted eliquis  5/26. She  is rate controlled today but feels fatigued. She has not taken BB on a regular basis since she had a day of bradycardia, she is taking prn.   F/u in afib clinic, 08/09/20. Pt asked to be seen today for ongoing afib x 3 weeks. She has afib with v rates in the 130's today. She feels short of breath with exertion and is fatigued. In June she went into afib and was set up for cardioversion but she self converted. She was waiting to see if she could convert on her own.   Today, she denies symptoms of palpitations, chest pain, shortness of breath, orthopnea, PND, lower extremity edema, dizziness, presyncope, syncope, or neurologic sequela. The patient is tolerating medications without difficulties and is otherwise without complaint today.   Past  Medical History:  Diagnosis Date  . Allergy   . Anemia   . Anxiety   . Atrial fibrillation (HCC)    paroxysmal only asa 81 mg, no other blood thinner, the xarelto she was on gave her HAs  . Barrett's esophagus 2018  . CAD (coronary artery disease)    cath 2006- nonobstructive CAD  . Cataract   . Depression   . Diabetes mellitus without complication ([place])   . Gastric polyp   . GERD (gastroesophageal reflux disease)   . Glucose intolerance (impaired glucose tolerance)   . Hiatal hernia   . History of transesophageal echocardiography (TEE) for monitoring    TEE 3/17:  EF 55%, no RWMA, mild plaque in descending aorta, mild MR, mod BAE, normal RVF  . HL (hearing loss)   . HTN (hypertension)   . Hyperlipidemia   . Hyperplastic colon polyp   . Internal hemorrhoids   . Intestinal metaplasia of gastric mucosa   . Obesity   . OSA (obstructive sleep apnea)    noncompliant with CPAP  . Tubular adenoma of colon    Past Surgical History:  Procedure Laterality Date  . BIOPSY  10/17/2019   Procedure: BIOPSY;  Surgeon: Kayla Bears, MD;  Location: [facility];  Service: Gastroenterology;;  . CARDIOVERSION N/A 01/23/2016   Procedure: CARDIOVERSION;  Surgeon: Kayla Dresser,  MD;  Location: [place];  Service: Cardiovascular;  Laterality: N/A;  . CARDIOVERSION N/A 12/07/2019   Procedure: CARDIOVERSION;  Surgeon: Kayla Casino, MD;  Location: [facility];  Service: Cardiovascular;  Laterality: N/A;  . CESAREAN SECTION    . ESOPHAGOGASTRODUODENOSCOPY (EGD) WITH PROPOFOL N/A 10/17/2019   Procedure: ESOPHAGOGASTRODUODENOSCOPY (EGD) WITH PROPOFOL;  Surgeon: Kayla Bears, MD;  Location: [facility];  Service: Gastroenterology;  Laterality: N/A;  . GANGLION CYST EXCISION Left 1970s  . HEMOSTASIS CLIP PLACEMENT  10/17/2019   Procedure: HEMOSTASIS CLIP PLACEMENT;  Surgeon: Kayla Bears, MD;  Location: [facility];  Service: Gastroenterology;;  . POLYPECTOMY  10/17/2019   Procedure:  POLYPECTOMY;  Surgeon: Kayla Bears, MD;  Location: [facility];  Service: Gastroenterology;;  . TEE WITHOUT CARDIOVERSION N/A 01/23/2016   Procedure: TRANSESOPHAGEAL ECHOCARDIOGRAM (TEE);  Surgeon: Kayla Dresser, MD;  Location: [place];  Service: Cardiovascular;  Laterality: N/A;  . TOTAL ABDOMINAL HYSTERECTOMY    . WRIST FRACTURE SURGERY  2010   wrist fracture repair with metal rod    Current Outpatient Medications  Medication Sig Dispense Refill  . atorvastatin (LIPITOR) 40 MG tablet Take 40 mg by mouth at bedtime.    . clonazePAM (KLONOPIN) 0.5 MG tablet Taking 1 tablet daily    . diltiazem (TIAZAC) 360 MG 24 hr capsule Take 360 mg by mouth daily.    Marland Kitchen ELIQUIS 5 MG TABS tablet TAKE 1 TABLET BY MOUTH TWICE A DAY 60 tablet 5  . Ferrous Sulfate (IRON) 325 (65 Fe) MG TABS Take 325 mg by mouth daily.     Marland Kitchen gabapentin (NEURONTIN) 300 MG capsule Take 300 mg by mouth 2 (two) times daily.    Marland Kitchen glimepiride (AMARYL) 4 MG tablet Take 4 mg by mouth 2 (two) times daily.  99  . glucose blood (ACCU-CHEK AVIVA PLUS) test strip CHECK BLOOD SUGAR TWICE DAILY AS DIRECTED    . hydrochlorothiazide (HYDRODIURIL) 25 MG tablet Take 25 mg by mouth daily.    . metFORMIN (GLUCOPHAGE) 500 MG tablet Take 1,000 mg by mouth 2 (two) times daily with a meal.     . metoprolol tartrate (LOPRESSOR) 25 MG tablet Take 0.5 tablets (12.5 mg total) by mouth 2 (two) times daily. 30 tablet 3  . omeprazole (PRILOSEC) 20 MG capsule Take 20 mg by mouth daily.     . Psyllium (NATURAL POWDER PO) Take by mouth. Super Beets- powder added to a glass of water- takes every day    . sitaGLIPtin (JANUVIA) 100 MG tablet Take 100 mg by mouth daily.    . Multiple Vitamin (MULTIVITAMIN) tablet Takes one chewable by mouth daily- GOLI medication (Patient not taking: Reported on 08/09/2020)     No current facility-administered medications for this encounter.    Allergies  Allergen Reactions  . Hydrocodone Itching    Social History    Socioeconomic History  . Marital status: Single    Spouse name: Not on file  . Number of children: Not on file  . Years of education: Not on file  . Highest education level: Not on file  Occupational History  . Not on file  Tobacco Use  . Smoking status: Former Smoker    Quit date: 2010    Years since quitting: 11.7  . Smokeless tobacco: Never Used  . Tobacco comment: 15-pack-year smoker  Vaping Use  . Vaping Use: Never used  Substance and Sexual Activity  . Alcohol use: No    Comment: rarely  . Drug  use: No  . Sexual activity: Not on file  Other Topics Concern  . Not on file  Social History Narrative   Pt. Lives in [place] with her son. 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    Social Determinants of Health   Financial Resource Strain:   . Difficulty of Paying Living Expenses: Not on file  Food Insecurity:   . Worried About Charity fundraiser in the Last Year: Not on file  . Ran Out of Food in the Last Year: Not on file  Transportation Needs:   . Lack of Transportation (Medical): Not on file  . Lack of Transportation (Non-Medical): Not on file  Physical Activity:   . Days of Exercise per Week: Not on file  . Minutes of Exercise per Session: Not on file  Stress:   . Feeling of Stress : Not on file  Social Connections:   . Frequency of Communication with Friends and Family: Not on file  . Frequency of Social Gatherings with Friends and Family: Not on file  . Attends Religious Services: Not on file  . Active Member of Clubs or Organizations: Not on file  . Attends Archivist Meetings: Not on file  . Marital Status: Not on file  Intimate Partner Violence:   . Fear of Current or Ex-Partner: Not on file  . Emotionally Abused: Not on file  . Physically Abused: Not on file  . Sexually Abused: Not on file    Family History  Problem Relation Age of Onset  . Cancer Father    . Stomach cancer Father   . Diabetes Other        noninsulin dependant DM  . Hypertension Other   . Stomach cancer Maternal Grandmother   . Colon cancer Neg Hx   . Colon polyps Neg Hx   . Esophageal cancer Neg Hx   . Rectal cancer Neg Hx     ROS- All systems are reviewed and negative except as per the HPI above  Physical Exam: Vitals:   08/09/20 1333  BP: (!) 148/68  Pulse: (!) 132  Weight: 107.9 kg  Height: 5\' 6"  (1.676 m)   Wt Readings from Last 3 Encounters:  08/09/20 107.9 kg  04/16/20 104.1 kg  03/16/20 104.8 kg    Labs: Lab Results  Component Value Date   NA 140 04/17/2020   K 4.2 04/17/2020   CL 99 04/17/2020   CO2 28 04/17/2020   GLUCOSE 65 (L) 04/17/2020   BUN 14 04/17/2020   CREATININE 0.98 04/17/2020   CALCIUM 8.4 (L) 04/17/2020   MG 1.7 01/02/2016   Lab Results  Component Value Date   INR 1.0 04/08/2012   Lab Results  Component Value Date   CHOL 159 04/10/2012   HDL 34 (L) 04/10/2012   LDLCALC 79 04/10/2012   TRIG 230 (H) 04/10/2012     GEN- The patient is well appearing, alert and oriented x 3 today.   Head- normocephalic, atraumatic Eyes-  Sclera clear, conjunctiva pink Ears- hearing intact Oropharynx- clear Neck- supple, no JVP Lymph- no cervical lymphadenopathy Lungs- Clear to ausculation bilaterally, normal work of breathing Heart-irregular rate and rhythm, no murmurs, rubs or gallops, PMI not laterally displaced GI- soft, NT, ND, + BS Extremities- no clubbing, cyanosis, or edema MS- no significant deformity or atrophy Skin- no rash or lesion Psych- euthymic mood, full affect Neuro- strength and sensation  are intact  EKG-  afib at 132 bpm, qrs int 94 ms, qtc 500 ms   Cath 2006- CONCLUSION:  1.  Well-preserved overall left ventricular function.  2.  Calcification of mid left anterior descending artery without critical    stenosis.   Echo-08/2017 - Left ventricle: The cavity size was normal. Wall thickness was   increased  in a pattern of mild LVH. Systolic function was   vigorous. The estimated ejection fraction was in the range of 65%   to 70%. Left ventricular diastolic function parameters were   normal.attered luminal irregularities throughout the coronary system.   Assessment and Plan: 1. Paroxysmal afib  Present x 3 weeks  Pt is symptomatic with fatigue and exertional shortness of breath Her weight is up 8 lbs since June, will hold hctz x 3 days and she will take lasix 20 mg x those 3 days then return to Hctz daily Will plan on cardioversion  10/6 Increase metoprolol to 25 mg bid, but return to 12.5 mg bid on day of cardioversion as she can get bradycardic at times in SR     CHA2DS2VASc score is at least 5,  no missed doses x 3 weeks  Bmet/cbc/covid testing Has had vaccines   She will be put out of work until after cardioversion  She is willing to consider tikosyn, will put  her on wait list for this when covid restrictions are lifted for elective procedures  She has first degree AV block and IRBBB so do not think flecainide would be best option, Bradycardia may limit Multaq , on young side for amiodarone     2. HTN Stable   Will see back one week after cardioversion   Kayla Hendrix, [facility] [address] [place], [place] [address] [phone]

## 2020-08-13 ENCOUNTER — Other Ambulatory Visit
Admission: RE | Admit: 2020-08-13 | Discharge: 2020-08-13 | Disposition: A | Discharge status: Home / Self Care | Payer: No Typology Code available for payment source | Source: Ambulatory Visit | Attending: Internal Medicine | Admitting: Internal Medicine

## 2020-08-13 ENCOUNTER — Other Ambulatory Visit: Payer: Self-pay

## 2020-08-13 DIAGNOSIS — Z20822 Contact with and (suspected) exposure to covid-19: Secondary | ICD-10-CM | POA: Insufficient documentation

## 2020-08-13 DIAGNOSIS — Z01812 Encounter for preprocedural laboratory examination: Secondary | ICD-10-CM | POA: Diagnosis not present

## 2020-08-13 LAB — SARS CORONAVIRUS 2 (TAT 6-24 HRS): SARS Coronavirus 2: NEGATIVE

## 2020-08-15 ENCOUNTER — Encounter (HOSPITAL_COMMUNITY)
Admission: RE | Disposition: A | Discharge status: Home / Self Care | Payer: No Typology Code available for payment source | Source: Home / Self Care | Attending: Internal Medicine

## 2020-08-15 ENCOUNTER — Ambulatory Visit (HOSPITAL_COMMUNITY): Payer: No Typology Code available for payment source | Admitting: Certified Registered Nurse Anesthetist

## 2020-08-15 ENCOUNTER — Encounter (HOSPITAL_COMMUNITY): Payer: Self-pay | Admitting: Internal Medicine

## 2020-08-15 ENCOUNTER — Other Ambulatory Visit: Payer: Self-pay

## 2020-08-15 ENCOUNTER — Ambulatory Visit (HOSPITAL_COMMUNITY)
Admission: RE | Admit: 2020-08-15 | Discharge: 2020-08-15 | Disposition: A | Discharge status: Home / Self Care | Payer: No Typology Code available for payment source | Attending: Internal Medicine | Admitting: Internal Medicine

## 2020-08-15 DIAGNOSIS — E785 Hyperlipidemia, unspecified: Secondary | ICD-10-CM | POA: Insufficient documentation

## 2020-08-15 DIAGNOSIS — I480 Paroxysmal atrial fibrillation: Secondary | ICD-10-CM | POA: Diagnosis not present

## 2020-08-15 DIAGNOSIS — Z6838 Body mass index (BMI) 38.0-38.9, adult: Secondary | ICD-10-CM | POA: Diagnosis not present

## 2020-08-15 DIAGNOSIS — F419 Anxiety disorder, unspecified: Secondary | ICD-10-CM | POA: Insufficient documentation

## 2020-08-15 DIAGNOSIS — Z7984 Long term (current) use of oral hypoglycemic drugs: Secondary | ICD-10-CM | POA: Diagnosis not present

## 2020-08-15 DIAGNOSIS — Z87891 Personal history of nicotine dependence: Secondary | ICD-10-CM | POA: Insufficient documentation

## 2020-08-15 DIAGNOSIS — E119 Type 2 diabetes mellitus without complications: Secondary | ICD-10-CM | POA: Diagnosis not present

## 2020-08-15 DIAGNOSIS — I440 Atrioventricular block, first degree: Secondary | ICD-10-CM | POA: Insufficient documentation

## 2020-08-15 DIAGNOSIS — E669 Obesity, unspecified: Secondary | ICD-10-CM | POA: Diagnosis not present

## 2020-08-15 DIAGNOSIS — I4891 Unspecified atrial fibrillation: Secondary | ICD-10-CM

## 2020-08-15 DIAGNOSIS — Z885 Allergy status to narcotic agent status: Secondary | ICD-10-CM | POA: Insufficient documentation

## 2020-08-15 DIAGNOSIS — Z79899 Other long term (current) drug therapy: Secondary | ICD-10-CM | POA: Diagnosis not present

## 2020-08-15 DIAGNOSIS — D649 Anemia, unspecified: Secondary | ICD-10-CM | POA: Diagnosis not present

## 2020-08-15 DIAGNOSIS — Z7901 Long term (current) use of anticoagulants: Secondary | ICD-10-CM | POA: Diagnosis not present

## 2020-08-15 DIAGNOSIS — K219 Gastro-esophageal reflux disease without esophagitis: Secondary | ICD-10-CM | POA: Diagnosis not present

## 2020-08-15 DIAGNOSIS — I10 Essential (primary) hypertension: Secondary | ICD-10-CM | POA: Diagnosis not present

## 2020-08-15 DIAGNOSIS — I2510 Atherosclerotic heart disease of native coronary artery without angina pectoris: Secondary | ICD-10-CM | POA: Diagnosis not present

## 2020-08-15 DIAGNOSIS — G4733 Obstructive sleep apnea (adult) (pediatric): Secondary | ICD-10-CM | POA: Diagnosis not present

## 2020-08-15 HISTORY — PX: CARDIOVERSION: SHX1299

## 2020-08-15 LAB — GLUCOSE, CAPILLARY: Glucose-Capillary: 111 mg/dL — ABNORMAL HIGH (ref 70–99)

## 2020-08-15 SURGERY — CARDIOVERSION
Anesthesia: General

## 2020-08-15 MED ORDER — LIDOCAINE 2% (20 MG/ML) 5 ML SYRINGE
INTRAMUSCULAR | Status: DC | PRN
  Administered 2020-08-15: 60 mg via INTRAVENOUS

## 2020-08-15 MED ORDER — PROPOFOL 10 MG/ML IV BOLUS
INTRAVENOUS | Status: DC | PRN
  Administered 2020-08-15: 50 mg via INTRAVENOUS

## 2020-08-15 MED ORDER — SODIUM CHLORIDE 0.9 % IV SOLN: INTRAVENOUS | Status: DC | PRN

## 2020-08-15 NOTE — Interval H&P Note (Signed)
History and Physical Interval Note:  08/15/2020 11:12 AM  Kayla Hendrix  has presented today for surgery, with the diagnosis of AFIB.  The various methods of treatment have been discussed with the patient and family. After consideration of risks, benefits and other options for treatment, the patient has consented to  Procedure(s): CARDIOVERSION (N/A) as a surgical intervention.  The patient's history has been reviewed, patient examined, no change in status, stable for surgery.  I have reviewed the patient's chart and labs.  Questions were answered to the patient's satisfaction.     Ashaki Frosch A Kariya Lavergne

## 2020-08-15 NOTE — Transfer of Care (Signed)
Immediate Anesthesia Transfer of Care Note  Patient: Kayla Hendrix  Procedure(s) Performed: CARDIOVERSION (N/A )  Patient Location: Endoscopy Unit  Anesthesia Type:General  Level of Consciousness: awake  Airway & Oxygen Therapy: Patient Spontanous Breathing  Post-op Assessment: Report given to RN and Post -op Vital signs reviewed and stable  Post vital signs: Reviewed and stable  Last Vitals:  Vitals Value Taken Time  BP 134/70   Temp    Pulse 128   Resp 14   SpO2 95%     Last Pain:  Vitals:   08/15/20 1030  TempSrc: Oral  PainSc: 0-No pain         Complications: No complications documented.

## 2020-08-15 NOTE — Anesthesia Preprocedure Evaluation (Signed)
Anesthesia Evaluation  Patient identified by MRN, date of birth, ID band Patient awake    Reviewed: Allergy & Precautions, NPO status , Patient's Chart, lab work & pertinent test results, reviewed documented beta blocker date and time   Airway Mallampati: III  TM Distance: >3 FB Neck ROM: Full    Dental  (+) Loose, Missing, Poor Dentition   Pulmonary sleep apnea , former smoker,    Pulmonary exam normal breath sounds clear to auscultation       Cardiovascular hypertension, Pt. on home beta blockers and Pt. on medications + CAD  + dysrhythmias Atrial Fibrillation  Rhythm:Irregular Rate:Abnormal     Neuro/Psych PSYCHIATRIC DISORDERS Anxiety Depression negative neurological ROS     GI/Hepatic Neg liver ROS, hiatal hernia, GERD  Medicated,  Endo/Other  diabetes, Type 2, Oral Hypoglycemic Agents  Renal/GU negative Renal ROS     Musculoskeletal negative musculoskeletal ROS (+)   Abdominal   Peds  Hematology negative hematology ROS (+)   Anesthesia Other Findings Day of surgery medications reviewed with the patient.  Reproductive/Obstetrics                             Anesthesia Physical Anesthesia Plan  ASA: III  Anesthesia Plan: General   Post-op Pain Management:    Induction: Intravenous  PONV Risk Score and Plan: 3 and Propofol infusion and Treatment may vary due to age or medical condition  Airway Management Planned: Mask  Additional Equipment:   Intra-op Plan:   Post-operative Plan:   Informed Consent: I have reviewed the patients History and Physical, chart, labs and discussed the procedure including the risks, benefits and alternatives for the proposed anesthesia with the patient or authorized representative who has indicated his/her understanding and acceptance.       Plan Discussed with: CRNA  Anesthesia Plan Comments:         Anesthesia Quick Evaluation

## 2020-08-15 NOTE — CV Procedure (Signed)
   Electrical Cardioversion Procedure Note NEVA RAMASWAMY [id number] 05-01-1951  Procedure: Electrical Cardioversion Indications:  Atrial Fibrillation  Time Out: Verified patient identification, verified procedure,medications/allergies/relevent history reviewed, required imaging and test results available.  Performed  Procedure Details  The patient was NPO after midnight. Anesthesia was administered at the beside  by Dr.Turk with 60 mg of propofol.  Cardioversion was done with synchronized biphasic defibrillation with AP pads with 200 J.  Two attempts were made.  Despite this, the patient remained in atrial fibrillation.  The patient tolerated the procedure well   IMPRESSION:  Unsuccessful cardioversion of atrial fibrillation    Galo Sayed A Jarae Panas 08/15/2020, 11:22 AM

## 2020-08-15 NOTE — Discharge Instructions (Signed)
Electrical Cardioversion Electrical cardioversion is the delivery of a jolt of electricity to restore a normal rhythm to the heart. A rhythm that is too fast or is not regular keeps the heart from pumping well. In this procedure, sticky patches or metal paddles are placed on the chest to deliver electricity to the heart from a device. This procedure may be done in an emergency if:  There is low or no blood pressure as a result of the heart rhythm.  Normal rhythm must be restored as fast as possible to protect the brain and heart from further damage.  It may save a life. This may also be a scheduled procedure for irregular or fast heart rhythms that are not immediately life-threatening. Tell a health care provider about:  Any allergies you have.  All medicines you are taking, including vitamins, herbs, eye drops, creams, and over-the-counter medicines.  Any problems you or family members have had with anesthetic medicines.  Any blood disorders you have.  Any surgeries you have had.  Any medical conditions you have.  Whether you are pregnant or may be pregnant. What are the risks? Generally, this is a safe procedure. However, problems may occur, including:  Allergic reactions to medicines.  A blood clot that breaks free and travels to other parts of your body.  The possible return of an abnormal heart rhythm within hours or days after the procedure.  Your heart stopping (cardiac arrest). This is rare. What happens before the procedure? Medicines  Your health care provider may have you start taking: ? Blood-thinning medicines (anticoagulants) so your blood does not clot as easily. ? Medicines to help stabilize your heart rate and rhythm.  Ask your health care provider about: ? Changing or stopping your regular medicines. This is especially important if you are taking diabetes medicines or blood thinners. ? Taking medicines such as aspirin and ibuprofen. These medicines can  thin your blood. Do not take these medicines unless your health care provider tells you to take them. ? Taking over-the-counter medicines, vitamins, herbs, and supplements. General instructions  Follow instructions from your health care provider about eating or drinking restrictions.  Plan to have someone take you home from the hospital or clinic.  If you will be going home right after the procedure, plan to have someone with you for 24 hours.  Ask your health care provider what steps will be taken to help prevent infection. These may include washing your skin with a germ-killing soap. What happens during the procedure?   An IV will be inserted into one of your veins.  Sticky patches (electrodes) or metal paddles may be placed on your chest.  You will be given a medicine to help you relax (sedative).  An electrical shock will be delivered. The procedure may vary among health care providers and hospitals. What can I expect after the procedure?  Your blood pressure, heart rate, breathing rate, and blood oxygen level will be monitored until you leave the hospital or clinic.  Your heart rhythm will be watched to make sure it does not change.  You may have some redness on the skin where the shocks were given. Follow these instructions at home:  Do not drive for 24 hours if you were given a sedative during your procedure.  Take over-the-counter and prescription medicines only as told by your health care provider.  Ask your health care provider how to check your pulse. Check it often.  Rest for 48 hours after the procedure or   as told by your health care provider.  Avoid or limit your caffeine use as told by your health care provider.  Keep all follow-up visits as told by your health care provider. This is important. Contact a health care provider if:  You feel like your heart is beating too quickly or your pulse is not regular.  You have a serious muscle cramp that does not go  away. Get help right away if:  You have discomfort in your chest.  You are dizzy or you feel faint.  You have trouble breathing or you are short of breath.  Your speech is slurred.  You have trouble moving an arm or leg on one side of your body.  Your fingers or toes turn cold or blue. Summary  Electrical cardioversion is the delivery of a jolt of electricity to restore a normal rhythm to the heart.  This procedure may be done right away in an emergency or may be a scheduled procedure if the condition is not an emergency.  Generally, this is a safe procedure.  After the procedure, check your pulse often as told by your health care provider. This information is not intended to replace advice given to you by your health care provider. Make sure you discuss any questions you have with your health care provider. Document Revised: 05/30/2019 Document Reviewed: 05/30/2019 Elsevier Patient Education  2020 Elsevier Inc.  

## 2020-08-16 NOTE — Anesthesia Postprocedure Evaluation (Signed)
Anesthesia Post Note  Patient: Kayla Hendrix  Procedure(s) Performed: CARDIOVERSION (N/A )     Patient location during evaluation: Endoscopy Anesthesia Type: General Level of consciousness: awake and alert Pain management: pain level controlled Vital Signs Assessment: post-procedure vital signs reviewed and stable Respiratory status: spontaneous breathing, nonlabored ventilation, respiratory function stable and patient connected to nasal cannula oxygen Cardiovascular status: stable Postop Assessment: no apparent nausea or vomiting Anesthetic complications: no   No complications documented.  Last Vitals:  Vitals:   08/15/20 1122 08/15/20 1135  BP: 129/63 (!) 130/92  Pulse: (!) 111 (!) 105  Resp: 18 11  Temp: 36.6 C   SpO2: 93% 94%    Last Pain:  Vitals:   08/16/20 1406  TempSrc:   PainSc: 0-No pain                 Catalina Gravel

## 2020-08-17 ENCOUNTER — Telehealth: Payer: Self-pay | Admitting: Pharmacist

## 2020-08-17 ENCOUNTER — Telehealth (HOSPITAL_COMMUNITY): Payer: Self-pay | Admitting: *Deleted

## 2020-08-17 MED ORDER — METOPROLOL TARTRATE 25 MG PO TABS
37.5000 mg | ORAL_TABLET | Freq: Two times a day (BID) | ORAL | 3 refills | Status: DC
Start: 2020-08-17 — End: 2020-08-20

## 2020-08-17 NOTE — Telephone Encounter (Signed)
Medication list reviewed in anticipation of upcoming Tikosyn initiation. Patient is HCTZ which is contraindicated. She will need to stop taking HCTZ at least 3 days prior to stating Tikosyn. She it not taking any QTc prolonging medications.   Patient is anticoagulated on Eliquis on the appropriate dose. Please ensure that patient has not missed any anticoagulation doses in the 3 weeks prior to Tikosyn initiation.   Patient will need to be counseled to avoid use of Benadryl while on Tikosyn and in the 2-3 days prior to Tikosyn initiation.  Please make sure K is at least 4 and Mag at least 2 before starting Tikosyn and during therapy especially since patient is on furosemide.

## 2020-08-17 NOTE — Telephone Encounter (Signed)
Pt failed cardioversion this week - HRs in the 130s. BP 123/95 HR 106 currently will increase metoprolol to 37.5mg  BID over weekend until follow up next week per Clint Fenton PA. Pt in agreement.

## 2020-08-18 ENCOUNTER — Other Ambulatory Visit (HOSPITAL_COMMUNITY): Payer: Self-pay | Admitting: Nurse Practitioner

## 2020-08-19 ENCOUNTER — Encounter (HOSPITAL_COMMUNITY): Payer: Self-pay | Admitting: Internal Medicine

## 2020-08-20 ENCOUNTER — Encounter (HOSPITAL_COMMUNITY): Payer: Self-pay | Admitting: Nurse Practitioner

## 2020-08-20 ENCOUNTER — Ambulatory Visit (HOSPITAL_COMMUNITY)
Admission: RE | Admit: 2020-08-20 | Discharge: 2020-08-20 | Disposition: A | Discharge status: Home / Self Care | Payer: No Typology Code available for payment source | Source: Ambulatory Visit | Attending: Nurse Practitioner | Admitting: Nurse Practitioner

## 2020-08-20 ENCOUNTER — Other Ambulatory Visit: Payer: Self-pay

## 2020-08-20 VITALS — BP 126/60 | HR 123 | Ht 66.0 in | Wt 232.0 lb

## 2020-08-20 DIAGNOSIS — K2270 Barrett's esophagus without dysplasia: Secondary | ICD-10-CM | POA: Diagnosis not present

## 2020-08-20 DIAGNOSIS — I10 Essential (primary) hypertension: Secondary | ICD-10-CM | POA: Insufficient documentation

## 2020-08-20 DIAGNOSIS — I4819 Other persistent atrial fibrillation: Secondary | ICD-10-CM | POA: Diagnosis not present

## 2020-08-20 DIAGNOSIS — K219 Gastro-esophageal reflux disease without esophagitis: Secondary | ICD-10-CM | POA: Insufficient documentation

## 2020-08-20 DIAGNOSIS — D6869 Other thrombophilia: Secondary | ICD-10-CM

## 2020-08-20 DIAGNOSIS — E669 Obesity, unspecified: Secondary | ICD-10-CM | POA: Insufficient documentation

## 2020-08-20 DIAGNOSIS — Z87891 Personal history of nicotine dependence: Secondary | ICD-10-CM | POA: Diagnosis not present

## 2020-08-20 DIAGNOSIS — E1136 Type 2 diabetes mellitus with diabetic cataract: Secondary | ICD-10-CM | POA: Diagnosis not present

## 2020-08-20 DIAGNOSIS — F419 Anxiety disorder, unspecified: Secondary | ICD-10-CM | POA: Diagnosis not present

## 2020-08-20 DIAGNOSIS — Z7901 Long term (current) use of anticoagulants: Secondary | ICD-10-CM | POA: Diagnosis not present

## 2020-08-20 DIAGNOSIS — I4519 Other right bundle-branch block: Secondary | ICD-10-CM | POA: Diagnosis not present

## 2020-08-20 DIAGNOSIS — Z8719 Personal history of other diseases of the digestive system: Secondary | ICD-10-CM | POA: Insufficient documentation

## 2020-08-20 DIAGNOSIS — Z79899 Other long term (current) drug therapy: Secondary | ICD-10-CM | POA: Diagnosis not present

## 2020-08-20 DIAGNOSIS — G4733 Obstructive sleep apnea (adult) (pediatric): Secondary | ICD-10-CM | POA: Diagnosis not present

## 2020-08-20 DIAGNOSIS — Z885 Allergy status to narcotic agent status: Secondary | ICD-10-CM | POA: Insufficient documentation

## 2020-08-20 DIAGNOSIS — I480 Paroxysmal atrial fibrillation: Secondary | ICD-10-CM | POA: Insufficient documentation

## 2020-08-20 DIAGNOSIS — I440 Atrioventricular block, first degree: Secondary | ICD-10-CM | POA: Insufficient documentation

## 2020-08-20 DIAGNOSIS — E785 Hyperlipidemia, unspecified: Secondary | ICD-10-CM | POA: Insufficient documentation

## 2020-08-20 DIAGNOSIS — H269 Unspecified cataract: Secondary | ICD-10-CM | POA: Diagnosis not present

## 2020-08-20 DIAGNOSIS — Z6837 Body mass index (BMI) 37.0-37.9, adult: Secondary | ICD-10-CM | POA: Diagnosis not present

## 2020-08-20 DIAGNOSIS — Z7984 Long term (current) use of oral hypoglycemic drugs: Secondary | ICD-10-CM | POA: Insufficient documentation

## 2020-08-20 DIAGNOSIS — I2510 Atherosclerotic heart disease of native coronary artery without angina pectoris: Secondary | ICD-10-CM | POA: Diagnosis not present

## 2020-08-20 LAB — BASIC METABOLIC PANEL
Anion gap: 14 (ref 5–15)
BUN: 10 mg/dL (ref 8–23)
CO2: 28 mmol/L (ref 22–32)
Calcium: 8.5 mg/dL — ABNORMAL LOW (ref 8.9–10.3)
Chloride: 101 mmol/L (ref 98–111)
Creatinine, Ser: 0.95 mg/dL (ref 0.44–1.00)
GFR, Estimated: >60 mL/min (ref >60)
Glucose, Bld: 116 mg/dL — ABNORMAL HIGH (ref 70–99)
Potassium: 4.3 mmol/L (ref 3.5–5.1)
Sodium: 143 mmol/L (ref 135–145)

## 2020-08-20 LAB — MAGNESIUM: Magnesium: 1.5 mg/dL — ABNORMAL LOW (ref 1.7–2.4)

## 2020-08-20 NOTE — Progress Notes (Signed)
Primary Care Physician: [name], MD Referring Physician: [name] is a 69 y.o. female with a h/o paroxysmal afib that is in the afib clinic 11/29/19  for persistent afib that had been present since the  previous  Sunday. She was scheduled for a cardioversion in 2019 but converted herself and DCCV was cancelled. Last  cardioversion was 2017. This  is the first afib that she  has had since 2019 that persisted for more than a few minutes.  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 She feels the stress from that may have been  her trigger. No missed doses of eliquis for the last 3 weeks, CHA2DS2VASc score of 4. Has OSA but intolerant to cpap.  F/u in afib clinic, 12/30/19. She had successful cardioversion 12/07/19 and continues in SR. She feels improved.   Asked to be seen today as she felt weak today. EKG shows junctional rhythm at 46 bpm. She does not remember taking any extra medicine by mistake. She  felt well yesterday.   F/u in afib clinic, 04/16/20 as pt went into afib last week. She  had an back injection the end of May and had to be off anticoagulation x 4 days. She restarted eliquis  5/26. She  is rate controlled today but feels fatigued. She has not taken BB on a regular basis since she had a day of bradycardia, she is taking prn.   F/u in afib clinic, 08/09/20. Pt asked to be seen today for ongoing afib x at least  3 weeks. She has afib with v rates in the 130's today. She feels short of breath with exertion and is fatigued. In June she went into afib and was set up for cardioversion but she self converted. She was waiting to see if she could convert on her own.   F/u in afib clinic, 08/20/20, she unfortunately did not shock out with cardioversion. She is here to discuss options to restore SR.  I feel Kayla Hendrix is her best option as she has a first degree AVB with IRBBB at baseline, on the young side for amiodarone. I feel Multaq may  be too expensive with her current drug plan and with her being in persistent  afib x several weeks, may not do the job to get her back in rhythm. Therefore we discussed admission for Tikosyn which she is in agreement. She is currently out of work since the cardioversion. She is very winded at work as she has to walk a long distance. I did give her lasix last week before the cardioversion and her weight is down almost 6 lbs this week.   Today, she denies symptoms of palpitations, chest pain, shortness of breath, orthopnea, PND, lower extremity edema, dizziness, presyncope, syncope, or neurologic sequela. The patient is tolerating medications without difficulties and is otherwise without complaint today.   Past Medical History:  Diagnosis Date  . Allergy   . Anemia   . Anxiety   . Atrial fibrillation (HCC)    paroxysmal only asa 81 mg, no other blood thinner, the xarelto she was on gave her HAs  . Barrett's esophagus 2018  . CAD (coronary artery disease)    cath 2006- nonobstructive CAD  . Cataract   . Depression   . Diabetes mellitus without complication ([place])   . Gastric polyp   . GERD (gastroesophageal reflux disease)   . Glucose intolerance (impaired  glucose tolerance)   . Hiatal hernia   . History of transesophageal echocardiography (TEE) for monitoring    TEE 3/17:  EF 55%, no RWMA, mild plaque in descending aorta, mild MR, mod BAE, normal RVF  . HL (hearing loss)   . HTN (hypertension)   . Hyperlipidemia   . Hyperplastic colon polyp   . Internal hemorrhoids   . Intestinal metaplasia of gastric mucosa   . Obesity   . OSA (obstructive sleep apnea)    noncompliant with CPAP  . Tubular adenoma of colon    Past Surgical History:  Procedure Laterality Date  . BIOPSY  10/17/2019   Procedure: BIOPSY;  Surgeon: Jerene Bears, MD;  Location: [facility] ENDOSCOPY;  Service: Gastroenterology;;  . CARDIOVERSION N/A 01/23/2016   Procedure: CARDIOVERSION;  Surgeon: Larey Dresser, MD;  Location:  [facility] ENDOSCOPY;  Service: Cardiovascular;  Laterality: N/A;  . CARDIOVERSION N/A 12/07/2019   Procedure: CARDIOVERSION;  Surgeon: Pixie Casino, MD;  Location: [facility] ENDOSCOPY;  Service: Cardiovascular;  Laterality: N/A;  . CARDIOVERSION N/A 08/15/2020   Procedure: CARDIOVERSION;  Surgeon: Werner Lean, MD;  Location: [facility] ENDOSCOPY;  Service: Cardiovascular;  Laterality: N/A;  . CESAREAN SECTION    . ESOPHAGOGASTRODUODENOSCOPY (EGD) WITH PROPOFOL N/A 10/17/2019   Procedure: ESOPHAGOGASTRODUODENOSCOPY (EGD) WITH PROPOFOL;  Surgeon: Jerene Bears, MD;  Location: [facility] ENDOSCOPY;  Service: Gastroenterology;  Laterality: N/A;  . GANGLION CYST EXCISION Left 1970s  . HEMOSTASIS CLIP PLACEMENT  10/17/2019   Procedure: HEMOSTASIS CLIP PLACEMENT;  Surgeon: Jerene Bears, MD;  Location: [facility] ENDOSCOPY;  Service: Gastroenterology;;  . POLYPECTOMY  10/17/2019   Procedure: POLYPECTOMY;  Surgeon: Jerene Bears, MD;  Location: [facility] ENDOSCOPY;  Service: Gastroenterology;;  . TEE WITHOUT CARDIOVERSION N/A 01/23/2016   Procedure: TRANSESOPHAGEAL ECHOCARDIOGRAM (TEE);  Surgeon: Larey Dresser, MD;  Location: [facility];  Service: Cardiovascular;  Laterality: N/A;  . TOTAL ABDOMINAL HYSTERECTOMY    . WRIST FRACTURE SURGERY  2010   wrist fracture repair with metal rod    Current Outpatient Medications  Medication Sig Dispense Refill  . atorvastatin (LIPITOR) 40 MG tablet Take 40 mg by mouth at bedtime.    . clonazePAM (KLONOPIN) 0.5 MG tablet Take 0.5 mg by mouth daily.     Marland Kitchen diltiazem (TIAZAC) 360 MG 24 hr capsule Take 360 mg by mouth daily.    Marland Kitchen ELIQUIS 5 MG TABS tablet TAKE 1 TABLET BY MOUTH TWICE A DAY (Patient taking differently: Take 5 mg by mouth 2 (two) times daily. ) 60 tablet 5  . Ferrous Sulfate (IRON) 325 (65 Fe) MG TABS Take 325 mg by mouth daily.     Marland Kitchen gabapentin (NEURONTIN) 300 MG capsule Take 300 mg by mouth 2 (two) times daily.    Marland Kitchen glimepiride (AMARYL) 4 MG tablet Take 4 mg by mouth 2 (two)  times daily.  99  . glucose blood (ACCU-CHEK AVIVA PLUS) test strip CHECK BLOOD SUGAR TWICE DAILY AS DIRECTED    . hydrochlorothiazide (HYDRODIURIL) 25 MG tablet Take 25 mg by mouth daily.    . Ibuprofen (ADVIL) 200 MG CAPS Take 400 mg by mouth daily as needed (feet and leg pain).    . metFORMIN (GLUCOPHAGE) 500 MG tablet Take 1,000 mg by mouth 2 (two) times daily with a meal.     . metoprolol tartrate (LOPRESSOR) 25 MG tablet Take 1.5 tablets (37.5 mg total) by mouth 2 (two) times daily. 120 tablet 3  . Multiple Vitamin (MULTIVITAMIN) tablet Take 1 tablet  by mouth. Takes one chewable by mouth daily- GOLI medication     . omeprazole (PRILOSEC) 20 MG capsule Take 20 mg by mouth daily.     . Psyllium (NATURAL POWDER PO) Take by mouth daily as needed. Super Beets- powder added to a glass of water- takes every day     . sitaGLIPtin (JANUVIA) 100 MG tablet Take 100 mg by mouth daily.    . furosemide (LASIX) 20 MG tablet Take 1 tablet (20 mg total) by mouth daily as needed for fluid. (Patient not taking: Reported on 08/20/2020) 15 tablet 0   No current facility-administered medications for this encounter.    Allergies  Allergen Reactions  . Hydrocodone Itching    Social History   Socioeconomic History  . Marital status: Single    Spouse name: Not on file  . Number of children: 1  . Years of education: Not on file  . Highest education level: Not on file  Occupational History  . Not on file  Tobacco Use  . Smoking status: Former Smoker    Quit date: 2010    Years since quitting: 11.7  . Smokeless tobacco: Never Used  . Tobacco comment: 15-pack-year smoker  Vaping Use  . Vaping Use: Never used  Substance and Sexual Activity  . Alcohol use: No    Comment: rarely  . Drug use: No  . Sexual activity: Not on file  Other Topics Concern  . Not on file  Social History Narrative   Pt. Lives in [place] with her son. 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    Social Determinants of Health   Financial Resource Strain:   . Difficulty of Paying Living Expenses: Not on file  Food Insecurity:   . Worried About Charity fundraiser in the Last Year: Not on file  . Ran Out of Food in the Last Year: Not on file  Transportation Needs:   . Lack of Transportation (Medical): Not on file  . Lack of Transportation (Non-Medical): Not on file  Physical Activity:   . Days of Exercise per Week: Not on file  . Minutes of Exercise per Session: Not on file  Stress:   . Feeling of Stress : Not on file  Social Connections:   . Frequency of Communication with Friends and Family: Not on file  . Frequency of Social Gatherings with Friends and Family: Not on file  . Attends Religious Services: Not on file  . Active Member of Clubs or Organizations: Not on file  . Attends Archivist Meetings: Not on file  . Marital Status: Not on file  Intimate Partner Violence:   . Fear of Current or Ex-Partner: Not on file  . Emotionally Abused: Not on file  . Physically Abused: Not on file  . Sexually Abused: Not on file    Family History  Problem Relation Age of Onset  . Cancer Father   . Stomach cancer Father   . Diabetes Other        noninsulin dependant DM  . Hypertension Other   . Stomach cancer Maternal Grandmother   . Colon cancer Neg Hx   . Colon polyps Neg Hx   . Esophageal cancer Neg Hx   . Rectal cancer Neg Hx     ROS- All systems are reviewed and negative except as per the HPI above  Physical Exam: Vitals:   08/20/20  1028  BP: 126/60  Pulse: (!) 123  Weight: 105.2 kg  Height: 5\' 6"  (1.676 m)   Wt Readings from Last 3 Encounters:  08/20/20 105.2 kg  08/15/20 107 kg  08/09/20 107.9 kg    Labs: Lab Results  Component Value Date   NA 143 08/20/2020   K 4.3 08/20/2020   CL 101 08/20/2020   CO2 28 08/20/2020   GLUCOSE 116 (H) 08/20/2020   BUN 10 08/20/2020   CREATININE 0.95  08/20/2020   CALCIUM 8.5 (L) 08/20/2020   MG 1.5 (L) 08/20/2020   Lab Results  Component Value Date   INR 1.0 04/08/2012   Lab Results  Component Value Date   CHOL 159 04/10/2012   HDL 34 (L) 04/10/2012   LDLCALC 79 04/10/2012   TRIG 230 (H) 04/10/2012     GEN- The patient is well appearing, alert and oriented x 3 today.   Head- normocephalic, atraumatic Eyes-  Sclera clear, conjunctiva pink Ears- hearing intact Oropharynx- clear Neck- supple, no JVP Lymph- no cervical lymphadenopathy Lungs- Clear to ausculation bilaterally, normal work of breathing Heart-irregular rate and rhythm, no murmurs, rubs or gallops, PMI not laterally displaced GI- soft, NT, ND, + BS Extremities- no clubbing, cyanosis, or edema MS- no significant deformity or atrophy Skin- no rash or lesion Psych- euthymic mood, full affect Neuro- strength and sensation are intact  EKG-  afib at 132 bpm, qrs int 94 ms, qtc 500 ms   Cath 2006- CONCLUSION:  1.  Well-preserved overall left ventricular function.  2.  Calcification of mid left anterior descending artery without critical    stenosis.   Echo-08/2017 - Left ventricle: The cavity size was normal. Wall thickness was   increased in a pattern of mild LVH. Systolic function was   vigorous. The estimated ejection fraction was in the range of 65%   to 70%. Left ventricular diastolic function parameters were   normal.attered luminal irregularities throughout the coronary system.   Assessment and Plan: 1. Paroxysmal afib  Present x 3-5 weeks  Pt is symptomatic with fatigue and exertional shortness of breath She failed cardioversion 10/6, her afib burden has increased over the last year She has increased  metoprolol to 25 mg bid, has reasonable rate control at home usually just under 100 She will continue diltiazem 360 mg daily  CHA2DS2VASc score is at least 5,  no missed doses x 3 weeks  Has had vaccines  She is willing to be admitted for  tikosyn,  will plan for this next Tuesday   She has first degree AV block and IRBBB so do not think flecainide would be best option, Bradycardia may limit Multaq , on young side for amiodarone   Does not use benadryl  Will have to stop HCTZ after Friday  Bmet/magtoday, /covid testing to be arranged   She will check on the price of dofetilide  qtc is stable    2. HTN Stable   Will see back 10/19 for admission    Butch Penny C. Daneshia Tavano, [facility] [address] [place], [place] [address] [phone]

## 2020-08-20 NOTE — Patient Instructions (Signed)
Stop HCTZ on Friday

## 2020-08-21 ENCOUNTER — Other Ambulatory Visit (HOSPITAL_COMMUNITY): Payer: Self-pay | Admitting: *Deleted

## 2020-08-21 MED ORDER — MAGNESIUM OXIDE -MG SUPPLEMENT 200 MG PO TABS: 200.0000 mg | ORAL_TABLET | Freq: Two times a day (BID) | ORAL | 0 refills | Status: DC

## 2020-08-21 NOTE — Telephone Encounter (Signed)
Pt notified to stop HCTZ as of Friday. Pt  Verbalized understanding.

## 2020-08-22 ENCOUNTER — Ambulatory Visit (HOSPITAL_COMMUNITY): Payer: No Typology Code available for payment source | Admitting: Nurse Practitioner

## 2020-08-24 ENCOUNTER — Other Ambulatory Visit: Payer: Self-pay

## 2020-08-24 ENCOUNTER — Other Ambulatory Visit
Admission: RE | Admit: 2020-08-24 | Discharge: 2020-08-24 | Disposition: A | Discharge status: Home / Self Care | Payer: No Typology Code available for payment source | Source: Ambulatory Visit | Attending: Internal Medicine | Admitting: Internal Medicine

## 2020-08-24 DIAGNOSIS — Z20822 Contact with and (suspected) exposure to covid-19: Secondary | ICD-10-CM | POA: Diagnosis not present

## 2020-08-24 DIAGNOSIS — Z01812 Encounter for preprocedural laboratory examination: Secondary | ICD-10-CM | POA: Insufficient documentation

## 2020-08-25 LAB — SARS CORONAVIRUS 2 (TAT 6-24 HRS): SARS Coronavirus 2: NEGATIVE

## 2020-08-27 ENCOUNTER — Telehealth (HOSPITAL_COMMUNITY): Payer: Self-pay | Admitting: *Deleted

## 2020-08-27 NOTE — Telephone Encounter (Signed)
Pt hospital admission for 10/19 for tikosyn loading approved reference #[id number] please fax clinical update on 10/20 fax [phone]

## 2020-08-28 ENCOUNTER — Other Ambulatory Visit (HOSPITAL_COMMUNITY): Payer: Self-pay | Admitting: *Deleted

## 2020-08-28 ENCOUNTER — Other Ambulatory Visit: Payer: Self-pay

## 2020-08-28 ENCOUNTER — Encounter (HOSPITAL_COMMUNITY): Payer: Self-pay | Admitting: Nurse Practitioner

## 2020-08-28 ENCOUNTER — Ambulatory Visit (HOSPITAL_COMMUNITY)
Admission: RE | Admit: 2020-08-28 | Discharge: 2020-08-28 | Disposition: A | Discharge status: Home / Self Care | Payer: No Typology Code available for payment source | Source: Ambulatory Visit | Attending: Nurse Practitioner | Admitting: Nurse Practitioner

## 2020-08-28 VITALS — BP 126/82 | HR 123 | Ht 66.0 in | Wt 234.2 lb

## 2020-08-28 DIAGNOSIS — I4891 Unspecified atrial fibrillation: Secondary | ICD-10-CM | POA: Diagnosis present

## 2020-08-28 DIAGNOSIS — Z79899 Other long term (current) drug therapy: Secondary | ICD-10-CM | POA: Insufficient documentation

## 2020-08-28 DIAGNOSIS — I4819 Other persistent atrial fibrillation: Secondary | ICD-10-CM

## 2020-08-28 LAB — BASIC METABOLIC PANEL
Anion gap: 15 (ref 5–15)
BUN: 11 mg/dL (ref 8–23)
CO2: 26 mmol/L (ref 22–32)
Calcium: 8.7 mg/dL — ABNORMAL LOW (ref 8.9–10.3)
Chloride: 102 mmol/L (ref 98–111)
Creatinine, Ser: 0.93 mg/dL (ref 0.44–1.00)
GFR, Estimated: >60 mL/min (ref >60)
Glucose, Bld: 137 mg/dL — ABNORMAL HIGH (ref 70–99)
Potassium: 5.1 mmol/L (ref 3.5–5.1)
Sodium: 143 mmol/L (ref 135–145)

## 2020-08-28 LAB — MAGNESIUM: Magnesium: 1.6 mg/dL — ABNORMAL LOW (ref 1.7–2.4)

## 2020-08-28 MED ORDER — MAGNESIUM OXIDE -MG SUPPLEMENT 200 MG PO TABS
200.0000 mg | ORAL_TABLET | Freq: Three times a day (TID) | ORAL | 0 refills | Status: DC
Start: 2020-08-28 — End: 2020-09-18

## 2020-08-28 NOTE — Addendum Note (Signed)
Encounter addended by: Sherran Needs, NP on: 08/28/2020 11:53 AM  Actions taken: Charge Capture section accepted

## 2020-08-28 NOTE — Patient Instructions (Signed)
STOP HCTZ after Friday.

## 2020-08-28 NOTE — Progress Notes (Signed)
Pt here today for tikosyn admit, however, she misunderstood instructions and did not stop her HCTZ last Friday. She  will be rescheduled for next Tuesday. Bmet/mag today as her mag was low last week at 1.5and she is taking magnesium daily now. EKg continues to show afib at 123 bpm.

## 2020-08-30 ENCOUNTER — Encounter (HOSPITAL_COMMUNITY): Payer: Self-pay | Admitting: *Deleted

## 2020-08-31 ENCOUNTER — Other Ambulatory Visit: Payer: Self-pay

## 2020-08-31 ENCOUNTER — Other Ambulatory Visit
Admission: RE | Admit: 2020-08-31 | Discharge: 2020-08-31 | Disposition: A | Discharge status: Home / Self Care | Payer: No Typology Code available for payment source | Source: Ambulatory Visit | Attending: Internal Medicine | Admitting: Internal Medicine

## 2020-08-31 ENCOUNTER — Other Ambulatory Visit (HOSPITAL_COMMUNITY): Payer: Self-pay | Admitting: *Deleted

## 2020-08-31 DIAGNOSIS — Z01812 Encounter for preprocedural laboratory examination: Secondary | ICD-10-CM | POA: Insufficient documentation

## 2020-08-31 DIAGNOSIS — Z20822 Contact with and (suspected) exposure to covid-19: Secondary | ICD-10-CM | POA: Diagnosis not present

## 2020-09-01 LAB — SARS CORONAVIRUS 2 (TAT 6-24 HRS): SARS Coronavirus 2: NEGATIVE

## 2020-09-04 ENCOUNTER — Other Ambulatory Visit: Payer: Self-pay

## 2020-09-04 ENCOUNTER — Ambulatory Visit (HOSPITAL_COMMUNITY)
Admission: RE | Admit: 2020-09-04 | Discharge: 2020-09-04 | Disposition: A | Discharge status: Home / Self Care | Payer: No Typology Code available for payment source | Source: Ambulatory Visit | Attending: Nurse Practitioner | Admitting: Nurse Practitioner

## 2020-09-04 ENCOUNTER — Inpatient Hospital Stay
Admission: AD | Admit: 2020-09-04 | Payer: No Typology Code available for payment source | Source: Ambulatory Visit | Admitting: Internal Medicine

## 2020-09-04 ENCOUNTER — Encounter (HOSPITAL_COMMUNITY): Payer: Self-pay | Admitting: Nurse Practitioner

## 2020-09-04 VITALS — BP 122/68 | HR 119 | Ht 66.0 in | Wt 236.4 lb

## 2020-09-04 DIAGNOSIS — I10 Essential (primary) hypertension: Secondary | ICD-10-CM | POA: Insufficient documentation

## 2020-09-04 DIAGNOSIS — I4819 Other persistent atrial fibrillation: Secondary | ICD-10-CM | POA: Diagnosis not present

## 2020-09-04 DIAGNOSIS — D6869 Other thrombophilia: Secondary | ICD-10-CM

## 2020-09-04 DIAGNOSIS — I480 Paroxysmal atrial fibrillation: Secondary | ICD-10-CM | POA: Insufficient documentation

## 2020-09-04 DIAGNOSIS — I4430 Unspecified atrioventricular block: Secondary | ICD-10-CM | POA: Insufficient documentation

## 2020-09-04 DIAGNOSIS — Z79899 Other long term (current) drug therapy: Secondary | ICD-10-CM | POA: Insufficient documentation

## 2020-09-04 DIAGNOSIS — I4891 Unspecified atrial fibrillation: Secondary | ICD-10-CM

## 2020-09-04 DIAGNOSIS — I4519 Other right bundle-branch block: Secondary | ICD-10-CM | POA: Insufficient documentation

## 2020-09-04 LAB — BASIC METABOLIC PANEL
Anion gap: 16 — ABNORMAL HIGH (ref 5–15)
BUN: 12 mg/dL (ref 8–23)
CO2: 21 mmol/L — ABNORMAL LOW (ref 22–32)
Calcium: 8.6 mg/dL — ABNORMAL LOW (ref 8.9–10.3)
Chloride: 104 mmol/L (ref 98–111)
Creatinine, Ser: 0.94 mg/dL (ref 0.44–1.00)
GFR, Estimated: >60 mL/min (ref >60)
Glucose, Bld: 129 mg/dL — ABNORMAL HIGH (ref 70–99)
Potassium: 4.5 mmol/L (ref 3.5–5.1)
Sodium: 141 mmol/L (ref 135–145)

## 2020-09-04 LAB — MAGNESIUM: Magnesium: 1.5 mg/dL — ABNORMAL LOW (ref 1.7–2.4)

## 2020-09-04 MED ORDER — HYDROCHLOROTHIAZIDE 25 MG PO TABS
25.0000 mg | ORAL_TABLET | Freq: Every day | ORAL | Status: DC
Start: 2020-09-04 — End: 2020-09-25

## 2020-09-04 MED ORDER — AMIODARONE HCL 200 MG PO TABS: ORAL_TABLET | ORAL | 0 refills | Status: DC

## 2020-09-04 NOTE — Progress Notes (Signed)
Primary Care Physician: [name], MD Referring Physician: [name] is a 69 y.o. female with a h/o paroxysmal afib that is in the afib clinic 11/29/19  for persistent afib that had been present since the  previous  Sunday. She was scheduled for a cardioversion in 2019 but converted herself and DCCV was cancelled. Last  cardioversion was 2017. This  is the first afib that she  has had since 2019 that persisted for more than a few minutes.  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 She feels the stress from that may have been  her trigger. No missed doses of eliquis for the last 3 weeks, CHA2DS2VASc score of 4. Has OSA but intolerant to cpap.  F/u in afib clinic, 12/30/19. She had successful cardioversion 12/07/19 and continues in SR. She feels improved.   Seen in May, 2021 as she felt weak today. EKG shows junctional rhythm at 46 bpm. She does not remember taking any extra medicine by mistake. She  felt well yesterday.   F/u in afib clinic, 04/16/20 as pt went into afib last week. She  had an back injection the end of May and had to be off anticoagulation x 4 days. She restarted eliquis  5/26. She  is rate controlled today but feels fatigued. She has not taken BB on a regular basis since she had a day of bradycardia, she is taking prn.   F/u in afib clinic, 08/09/20. Pt asked to be seen today for ongoing afib x at least  4-6 weeks. She has afib with v rates in the 130's today. She feels short of breath with exertion and is fatigued. In June she went into afib and was set up for cardioversion but she self converted. She was waiting to see if she could convert on her own.   F/u in afib clinic, 08/20/20, she unfortunately did not shock out with cardioversion. She is here to discuss options to restore SR.  I feel [name] is her best option as she has a first degree AVB with IRBBB at baseline, on the young side for amiodarone. I feel Multaq may be  too expensive with her current drug plan and with her being in persistent  afib x several weeks, may not do the job to get her back in rhythm. Therefore we discussed admission for Tikosyn which she is in agreement. She is currently out of work since the cardioversion. She is very winded at work as she has to walk a long distance. I did give her lasix last week before the cardioversion and her weight is down almost 6 lbs this week.   F/u in afib clinic, 10/26 for tikosyn load. She  was scheduled to go into hospital last week but misunderstood instructions to stop hctz and had taken all along.  She has now been off drug since 10/19. No missed anticoagulation. Qt today in afib is 514 ms,  qtc in SR in May  2021 qt was 437 ms, in June  488 ms, with IRBBB.   Today, she denies symptoms of palpitations, chest pain, shortness of breath, orthopnea, PND, lower extremity edema, dizziness, presyncope, syncope, or neurologic sequela. The patient is tolerating medications without difficulties and is otherwise without complaint today.   Past Medical History:  Diagnosis Date  . Allergy   . Anemia   . Anxiety   . Atrial fibrillation ([place])  paroxysmal only asa 81 mg, no other blood thinner, the xarelto she was on gave her HAs  . Barrett's esophagus 2018  . CAD (coronary artery disease)    cath 2006- nonobstructive CAD  . Cataract   . Depression   . Diabetes mellitus without complication ([place])   . Gastric polyp   . GERD (gastroesophageal reflux disease)   . Glucose intolerance (impaired glucose tolerance)   . Hiatal hernia   . History of transesophageal echocardiography (TEE) for monitoring    TEE 3/17:  EF 55%, no RWMA, mild plaque in descending aorta, mild MR, mod BAE, normal RVF  . HL (hearing loss)   . HTN (hypertension)   . Hyperlipidemia   . Hyperplastic colon polyp   . Internal hemorrhoids   . Intestinal metaplasia of gastric mucosa   . Obesity   . OSA (obstructive sleep apnea)     noncompliant with CPAP  . Tubular adenoma of colon    Past Surgical History:  Procedure Laterality Date  . BIOPSY  10/17/2019   Procedure: BIOPSY;  Surgeon: Jerene Bears, MD;  Location: [facility];  Service: Gastroenterology;;  . CARDIOVERSION N/A 01/23/2016   Procedure: CARDIOVERSION;  Surgeon: Larey Dresser, MD;  Location: [facility] ENDOSCOPY;  Service: Cardiovascular;  Laterality: N/A;  . CARDIOVERSION N/A 12/07/2019   Procedure: CARDIOVERSION;  Surgeon: Pixie Casino, MD;  Location: [facility] ENDOSCOPY;  Service: Cardiovascular;  Laterality: N/A;  . CARDIOVERSION N/A 08/15/2020   Procedure: CARDIOVERSION;  Surgeon: Werner Lean, MD;  Location: [facility] ENDOSCOPY;  Service: Cardiovascular;  Laterality: N/A;  . CESAREAN SECTION    . ESOPHAGOGASTRODUODENOSCOPY (EGD) WITH PROPOFOL N/A 10/17/2019   Procedure: ESOPHAGOGASTRODUODENOSCOPY (EGD) WITH PROPOFOL;  Surgeon: Jerene Bears, MD;  Location: [facility] ENDOSCOPY;  Service: Gastroenterology;  Laterality: N/A;  . GANGLION CYST EXCISION Left 1970s  . HEMOSTASIS CLIP PLACEMENT  10/17/2019   Procedure: HEMOSTASIS CLIP PLACEMENT;  Surgeon: Jerene Bears, MD;  Location: [facility] ENDOSCOPY;  Service: Gastroenterology;;  . POLYPECTOMY  10/17/2019   Procedure: POLYPECTOMY;  Surgeon: Jerene Bears, MD;  Location: [facility] ENDOSCOPY;  Service: Gastroenterology;;  . TEE WITHOUT CARDIOVERSION N/A 01/23/2016   Procedure: TRANSESOPHAGEAL ECHOCARDIOGRAM (TEE);  Surgeon: Larey Dresser, MD;  Location: [place];  Service: Cardiovascular;  Laterality: N/A;  . TOTAL ABDOMINAL HYSTERECTOMY    . WRIST FRACTURE SURGERY  2010   wrist fracture repair with metal rod    Current Outpatient Medications  Medication Sig Dispense Refill  . atorvastatin (LIPITOR) 40 MG tablet Take 40 mg by mouth at bedtime.    . clonazePAM (KLONOPIN) 0.5 MG tablet Take 0.5 mg by mouth daily.     Marland Kitchen diltiazem (TIAZAC) 360 MG 24 hr capsule Take 360 mg by mouth daily.    Marland Kitchen ELIQUIS 5 MG TABS tablet TAKE 1  TABLET BY MOUTH TWICE A DAY (Patient taking differently: Take 5 mg by mouth 2 (two) times daily. ) 60 tablet 5  . Ferrous Sulfate (IRON) 325 (65 Fe) MG TABS Take 325 mg by mouth daily.     Marland Kitchen gabapentin (NEURONTIN) 300 MG capsule Take 300 mg by mouth 2 (two) times daily.    Marland Kitchen glimepiride (AMARYL) 4 MG tablet Take 4 mg by mouth 2 (two) times daily.  99  . glucose blood (ACCU-CHEK AVIVA PLUS) test strip CHECK BLOOD SUGAR TWICE DAILY AS DIRECTED    . Ibuprofen (ADVIL) 200 MG CAPS Take 400 mg by mouth daily as needed (feet and leg pain).    Marland Kitchen  Magnesium Oxide 200 MG TABS Take 1 tablet (200 mg total) by mouth 3 (three) times daily with meals.  0  . metFORMIN (GLUCOPHAGE) 500 MG tablet Take 1,000 mg by mouth 2 (two) times daily with a meal.     . metoprolol tartrate (LOPRESSOR) 25 MG tablet Take 1.5 tablets (37.5 mg total) by mouth 2 (two) times daily. 120 tablet 3  . Multiple Vitamin (MULTIVITAMIN) tablet Take 1 tablet by mouth. Takes one chewable by mouth daily- GOLI medication     . omeprazole (PRILOSEC) 20 MG capsule Take 20 mg by mouth daily.     . Psyllium (NATURAL POWDER PO) Take by mouth daily as needed. Super Beets- powder added to a glass of water- takes every day     . sitaGLIPtin (JANUVIA) 100 MG tablet Take 100 mg by mouth daily.    . furosemide (LASIX) 20 MG tablet Take 1 tablet (20 mg total) by mouth daily as needed for fluid. (Patient not taking: Reported on 08/20/2020) 15 tablet 0  . hydrochlorothiazide (HYDRODIURIL) 25 MG tablet Take 25 mg by mouth daily. STOP as of today 10/19 (Patient not taking: Reported on 09/04/2020)     No current facility-administered medications for this encounter.    Allergies  Allergen Reactions  . Hydrocodone Itching    Social History   Socioeconomic History  . Marital status: Single    Spouse name: Not on file  . Number of children: 1  . Years of education: Not on file  . Highest education level: Not on file  Occupational History  . Not on file    Tobacco Use  . Smoking status: Former Smoker    Quit date: 2010    Years since quitting: 11.8  . Smokeless tobacco: Never Used  . Tobacco comment: 15-pack-year smoker  Vaping Use  . Vaping Use: Never used  Substance and Sexual Activity  . Alcohol use: No    Comment: rarely  . Drug use: No  . Sexual activity: Not on file  Other Topics Concern  . Not on file  Social History Narrative   Pt. Lives in [place] with her son. 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    Social Determinants of Health   Financial Resource Strain:   . Difficulty of Paying Living Expenses: Not on file  Food Insecurity:   . Worried About Charity fundraiser in the Last Year: Not on file  . Ran Out of Food in the Last Year: Not on file  Transportation Needs:   . Lack of Transportation (Medical): Not on file  . Lack of Transportation (Non-Medical): Not on file  Physical Activity:   . Days of Exercise per Week: Not on file  . Minutes of Exercise per Session: Not on file  Stress:   . Feeling of Stress : Not on file  Social Connections:   . Frequency of Communication with Friends and Family: Not on file  . Frequency of Social Gatherings with Friends and Family: Not on file  . Attends Religious Services: Not on file  . Active Member of Clubs or Organizations: Not on file  . Attends Archivist Meetings: Not on file  . Marital Status: Not on file  Intimate Partner Violence:   . Fear of Current or Ex-Partner: Not on file  . Emotionally Abused: Not on file  . Physically Abused: Not on file  . Sexually Abused:  Not on file    Family History  Problem Relation Age of Onset  . Cancer Father   . Stomach cancer Father   . Diabetes Other        noninsulin dependant DM  . Hypertension Other   . Stomach cancer Maternal Grandmother   . Colon cancer Neg Hx   . Colon polyps Neg Hx   . Esophageal cancer Neg Hx   . Rectal  cancer Neg Hx     ROS- All systems are reviewed and negative except as per the HPI above  Physical Exam: Vitals:   09/04/20 0934  BP: 122/68  Weight: 107.2 kg  Height: 5\' 6"  (1.676 m)   Wt Readings from Last 3 Encounters:  09/04/20 107.2 kg  08/28/20 106.2 kg  08/20/20 105.2 kg    Labs: Lab Results  Component Value Date   NA 143 08/28/2020   K 5.1 08/28/2020   CL 102 08/28/2020   CO2 26 08/28/2020   GLUCOSE 137 (H) 08/28/2020   BUN 11 08/28/2020   CREATININE 0.93 08/28/2020   CALCIUM 8.7 (L) 08/28/2020   MG 1.6 (L) 08/28/2020   Lab Results  Component Value Date   INR 1.0 04/08/2012   Lab Results  Component Value Date   CHOL 159 04/10/2012   HDL 34 (L) 04/10/2012   LDLCALC 79 04/10/2012   TRIG 230 (H) 04/10/2012     GEN- The patient is well appearing, alert and oriented x 3 today.   Head- normocephalic, atraumatic Eyes-  Sclera clear, conjunctiva pink Ears- hearing intact Oropharynx- clear Neck- supple, no JVP Lymph- no cervical lymphadenopathy Lungs- Clear to ausculation bilaterally, normal work of breathing Heart-irregular rate and rhythm, no murmurs, rubs or gallops, PMI not laterally displaced GI- soft, NT, ND, + BS Extremities- no clubbing, cyanosis, or edema MS- no significant deformity or atrophy Skin- no rash or lesion Psych- euthymic mood, full affect Neuro- strength and sensation are intact  EKG-  afib at 119 bpm, qrs int 92 ms, qtc 514  ms   Cath 2006- CONCLUSION:  1.  Well-preserved overall left ventricular function.  2.  Calcification of mid left anterior descending artery without critical    stenosis.   Echo-08/2017 - Left ventricle: The cavity size was normal. Wall thickness was   increased in a pattern of mild LVH. Systolic function was   vigorous. The estimated ejection fraction was in the range of 65%   to 70%. Left ventricular diastolic function parameters were   normal.attered luminal irregularities throughout the coronary  system. Left atrium normal in size.   Assessment and Plan: 1. Paroxysmal afib  Present x 8-12 weeks  Pt is symptomatic with fatigue and exertional shortness of breath She failed cardioversion 10/6, her afib burden has increased over the last year She has increased  metoprolol to 25 mg bid, has reasonable rate control at home usually just under 100 She will continue diltiazem 360 mg daily  CHA2DS2VASc score is at least 5,  no missed doses x 3 weeks  Has had vaccines  She is willing to be admitted for  tikosyn, will plan for this next Tuesday   She has first degree AV block and IRBBB so do not think flecainide would be best option, Bradycardia may limit Multaq , on young side for amiodarone   Does not use benadryl Off HCTZ since 10/19 Bmet/magtoday, /covid testing negative 10/22 Aware of  price of dofetilide, may use Good RX  qtc is over 500 ms in  afib with rvr Has run 437 ms  to 488 ms  in SR with IRBBB  2. HTN Stable   Her mag today is still at 1.5 with her stopping HCTZ and increasing her mag to 200 mg tid. I discussed with Dr. Rayann Heman and with concerns of not being to able to keep up mag to close to 2 and borderline qt, he is concerned we will not be able to get Tikosyn on board. Her PPI may be interfering with magnesium absorption but she can not do without her PPI as she has significant indigestion and Barett's esophagus. Therefore, the plan will be changed to not admitting for tikosyn but starting amiodarone 200 mg bid x one month and then will try cardioversion. She will reduce her BB to 12.5 mg bid. She will come back in one week for EKG on amiodarone. Pt understands the reason we can not go forward with tikosyn and is in agreement with the plan. She can reduce mag to 1200 mg bid. She can resume her HCTZ. We will update her echo in SR and hopefully plan for an ablation in 3-6 months if left atrial size is in the normal range.    Geroge Baseman Cadan Maggart, [facility] [address] [place], [place] [address] [phone]

## 2020-09-10 ENCOUNTER — Encounter (HOSPITAL_COMMUNITY): Payer: Self-pay | Admitting: Nurse Practitioner

## 2020-09-10 ENCOUNTER — Other Ambulatory Visit: Payer: Self-pay

## 2020-09-10 ENCOUNTER — Ambulatory Visit (HOSPITAL_COMMUNITY): Payer: No Typology Code available for payment source | Admitting: Physician Assistant

## 2020-09-10 ENCOUNTER — Ambulatory Visit (HOSPITAL_COMMUNITY)
Admission: RE | Admit: 2020-09-10 | Discharge: 2020-09-10 | Disposition: A | Discharge status: Home / Self Care | Payer: No Typology Code available for payment source | Source: Ambulatory Visit | Attending: Nurse Practitioner | Admitting: Nurse Practitioner

## 2020-09-10 VITALS — BP 118/70 | HR 118

## 2020-09-10 DIAGNOSIS — Z7901 Long term (current) use of anticoagulants: Secondary | ICD-10-CM | POA: Insufficient documentation

## 2020-09-10 DIAGNOSIS — D6869 Other thrombophilia: Secondary | ICD-10-CM | POA: Diagnosis not present

## 2020-09-10 DIAGNOSIS — I4891 Unspecified atrial fibrillation: Secondary | ICD-10-CM | POA: Insufficient documentation

## 2020-09-10 DIAGNOSIS — R0602 Shortness of breath: Secondary | ICD-10-CM | POA: Insufficient documentation

## 2020-09-10 DIAGNOSIS — Z79899 Other long term (current) drug therapy: Secondary | ICD-10-CM | POA: Diagnosis not present

## 2020-09-10 NOTE — Patient Instructions (Signed)
Decrease Amiodarone 200mg - Take one tablet by mouth daily -until Friday Decrease Metoprolol 25mg  tablet- Take one tablet by mouth twice daily until Friday Contact Stacy Friday- to let her know how you are feeling if the Nausea is any better

## 2020-09-10 NOTE — Progress Notes (Signed)
Kayla Hendrix is in today for a one week f/u for amiodarone start after it was felt that Tikosyn may not be the best choice as her mag levels were still staying 1.5 with taking 200 mg tid of magnesium. Also her qtc interval was borderline long for tikosyn..   She states that she has had nausea since starting amiodarone and still has shortness of breath with activities. She asking to stay out of work until she feels better with the nausea improved. She  will drop back to 200 mg qd instead of 200 mg bid until Friday and see if nausea lessens and if so will try to increase back to 200 mg bid. If nausea still does not improve, will send her to Dr. Rayann Heman to see about front line ablation.    Ekg shows afib at 118 bpm, qrs int 86 ms, qtc 496 ms  She will increase her metoprolol back to 25 mg bid for additional rate control   She will call the office on Friday to see if nausea improves with amio decreased to  one a day and I will see back in the office next Monday, She will be excused form work until my next office visit.

## 2020-09-17 ENCOUNTER — Encounter (HOSPITAL_COMMUNITY): Payer: Self-pay | Admitting: Nurse Practitioner

## 2020-09-17 ENCOUNTER — Other Ambulatory Visit: Payer: Self-pay

## 2020-09-17 ENCOUNTER — Ambulatory Visit (HOSPITAL_COMMUNITY)
Admission: RE | Admit: 2020-09-17 | Discharge: 2020-09-17 | Disposition: A | Discharge status: Home / Self Care | Payer: No Typology Code available for payment source | Source: Ambulatory Visit | Attending: Nurse Practitioner | Admitting: Nurse Practitioner

## 2020-09-17 VITALS — BP 140/64 | HR 118 | Ht 66.0 in | Wt 233.2 lb

## 2020-09-17 DIAGNOSIS — D6869 Other thrombophilia: Secondary | ICD-10-CM | POA: Diagnosis not present

## 2020-09-17 DIAGNOSIS — R0602 Shortness of breath: Secondary | ICD-10-CM | POA: Insufficient documentation

## 2020-09-17 DIAGNOSIS — Z87891 Personal history of nicotine dependence: Secondary | ICD-10-CM | POA: Diagnosis not present

## 2020-09-17 DIAGNOSIS — Z7901 Long term (current) use of anticoagulants: Secondary | ICD-10-CM | POA: Diagnosis not present

## 2020-09-17 DIAGNOSIS — I4811 Longstanding persistent atrial fibrillation: Secondary | ICD-10-CM

## 2020-09-17 DIAGNOSIS — I4819 Other persistent atrial fibrillation: Secondary | ICD-10-CM | POA: Diagnosis not present

## 2020-09-17 DIAGNOSIS — R0600 Dyspnea, unspecified: Secondary | ICD-10-CM

## 2020-09-17 DIAGNOSIS — Z79899 Other long term (current) drug therapy: Secondary | ICD-10-CM | POA: Insufficient documentation

## 2020-09-17 NOTE — Patient Instructions (Signed)
Stop amiodarone  Increase metoprolol to 50mg  twice a day

## 2020-09-17 NOTE — Progress Notes (Signed)
Primary Care Physician: [name], MD Referring Physician: [name] is a 69 y.o. female with a h/o paroxysmal afib that is in the afib clinic 11/29/19  for persistent afib that had been present since the  previous  Sunday. She was scheduled for a cardioversion in 2019 but converted herself and DCCV was cancelled. Last  cardioversion was 2017. This  is the first afib that she  has had since 2019 that persisted for more than a few minutes.  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 She feels the stress from that may have been  her trigger. No missed doses of eliquis for the last 3 weeks, CHA2DS2VASc score of 4. Has OSA but intolerant to cpap.  F/u in afib clinic, 12/30/19. She had successful cardioversion 12/07/19 and continues in SR. She feels improved.   Asked to be seen today as she felt weak today. EKG shows junctional rhythm at 46 bpm. She does not remember taking any extra medicine by mistake. She  felt well yesterday.   F/u in afib clinic, 04/16/20 as pt went into afib last week. She  had an back injection the end of May and had to be off anticoagulation x 4 days. She restarted eliquis  5/26. She  is rate controlled today but feels fatigued. She has not taken BB on a regular basis since she had a day of bradycardia, she is taking prn.   F/u in afib clinic, 08/09/20. Pt asked to be seen today for ongoing afib x at least  3 weeks. She has afib with v rates in the 130's today. She feels short of breath with exertion and is fatigued. In June she went into afib and was set up for cardioversion but she self converted. She was waiting to see if she could convert on her own.   F/u in afib clinic, 08/20/20, she unfortunately did not shock out with cardioversion. She is here to discuss options to restore SR.  I feel [name] is her best option as she has a first degree AVB with IRBBB at baseline, on the young side for amiodarone. I feel Multaq may  be too expensive with her current drug plan and with her being in persistent  afib x several weeks, may not do the job to get her back in rhythm. Therefore we discussed admission for Tikosyn which she is in agreement. She is currently out of work since the cardioversion. She is very winded at work as she has to walk a long distance. I did give her lasix last week before the cardioversion and her weight is down almost 6 lbs this week.   F/u in afib clinic,09/24/20. Mrs Owczarzak  was not able to come into the hospital for [name] admit as she had a very low magnesium on 600 mg magnesium a day. Her qt was borderline and with the low magnesium, it was felt that tikosyn may not work for pt per Dr. Rayann Heman. It was decided to use amiodarone as an bridge to ablation. The pt on f/u stated that she  could not tolerate the 200 mg bid of amiodarone  because of anorexia and nausea. I lowered the dose of amiodarone to  200 mg qd last week and the pt today states that the nausea has improved but still present and she also has some diarrhea. She  is c/o of more shortness of breath as well with  the amiodarone. She will stop amiodarone, increase metoprolol tartrate to 50 mg bid for better rate control and I will update her echo and have her f/u with Dr. Rayann Heman to discuss ablation. She  has been out of work since the  anticipated hospitalization for tikosyn and then  the intolerance of amiodarone. She states that it is difficult to walk into to work as she  has to walk a longer distance and up stairs to her desk because of covid screening. I asked if she could get an exception to come into the door closer to her work and she thinks that the employer would not allow this.   Today, she denies symptoms of palpitations, chest pain, shortness of breath, orthopnea, PND, lower extremity edema, dizziness, presyncope, syncope, or neurologic sequela. The patient is tolerating medications without difficulties and is otherwise without complaint  today.   Past Medical History:  Diagnosis Date  . Allergy   . Anemia   . Anxiety   . Atrial fibrillation (HCC)    paroxysmal only asa 81 mg, no other blood thinner, the xarelto she was on gave her HAs  . Barrett's esophagus 2018  . CAD (coronary artery disease)    cath 2006- nonobstructive CAD  . Cataract   . Depression   . Diabetes mellitus without complication ([place])   . Gastric polyp   . GERD (gastroesophageal reflux disease)   . Glucose intolerance (impaired glucose tolerance)   . Hiatal hernia   . History of transesophageal echocardiography (TEE) for monitoring    TEE 3/17:  EF 55%, no RWMA, mild plaque in descending aorta, mild MR, mod BAE, normal RVF  . HL (hearing loss)   . HTN (hypertension)   . Hyperlipidemia   . Hyperplastic colon polyp   . Internal hemorrhoids   . Intestinal metaplasia of gastric mucosa   . Obesity   . OSA (obstructive sleep apnea)    noncompliant with CPAP  . Tubular adenoma of colon    Past Surgical History:  Procedure Laterality Date  . BIOPSY  10/17/2019   Procedure: BIOPSY;  Surgeon: Jerene Bears, MD;  Location: [facility];  Service: Gastroenterology;;  . CARDIOVERSION N/A 01/23/2016   Procedure: CARDIOVERSION;  Surgeon: Larey Dresser, MD;  Location: [facility] ENDOSCOPY;  Service: Cardiovascular;  Laterality: N/A;  . CARDIOVERSION N/A 12/07/2019   Procedure: CARDIOVERSION;  Surgeon: Pixie Casino, MD;  Location: [facility] ENDOSCOPY;  Service: Cardiovascular;  Laterality: N/A;  . CARDIOVERSION N/A 08/15/2020   Procedure: CARDIOVERSION;  Surgeon: Werner Lean, MD;  Location: [facility] ENDOSCOPY;  Service: Cardiovascular;  Laterality: N/A;  . CESAREAN SECTION    . ESOPHAGOGASTRODUODENOSCOPY (EGD) WITH PROPOFOL N/A 10/17/2019   Procedure: ESOPHAGOGASTRODUODENOSCOPY (EGD) WITH PROPOFOL;  Surgeon: Jerene Bears, MD;  Location: [facility] ENDOSCOPY;  Service: Gastroenterology;  Laterality: N/A;  . GANGLION CYST EXCISION Left 1970s  . HEMOSTASIS CLIP PLACEMENT   10/17/2019   Procedure: HEMOSTASIS CLIP PLACEMENT;  Surgeon: Jerene Bears, MD;  Location: [facility] ENDOSCOPY;  Service: Gastroenterology;;  . POLYPECTOMY  10/17/2019   Procedure: POLYPECTOMY;  Surgeon: Jerene Bears, MD;  Location: [facility] ENDOSCOPY;  Service: Gastroenterology;;  . TEE WITHOUT CARDIOVERSION N/A 01/23/2016   Procedure: TRANSESOPHAGEAL ECHOCARDIOGRAM (TEE);  Surgeon: Larey Dresser, MD;  Location: [place];  Service: Cardiovascular;  Laterality: N/A;  . TOTAL ABDOMINAL HYSTERECTOMY    . WRIST FRACTURE SURGERY  2010   wrist fracture repair with metal rod    Current Outpatient Medications  Medication Sig Dispense  Refill  . atorvastatin (LIPITOR) 40 MG tablet Take 40 mg by mouth at bedtime.    . clonazePAM (KLONOPIN) 0.5 MG tablet Take 0.5 mg by mouth daily.     Marland Kitchen diltiazem (TIAZAC) 360 MG 24 hr capsule Take 360 mg by mouth daily.    Marland Kitchen ELIQUIS 5 MG TABS tablet TAKE 1 TABLET BY MOUTH TWICE A DAY (Patient taking differently: Take 5 mg by mouth 2 (two) times daily. ) 60 tablet 5  . Ferrous Sulfate (IRON) 325 (65 Fe) MG TABS Take 325 mg by mouth daily.     Marland Kitchen gabapentin (NEURONTIN) 300 MG capsule Take 300 mg by mouth 2 (two) times daily.    Marland Kitchen glimepiride (AMARYL) 4 MG tablet Take 4 mg by mouth 2 (two) times daily.  99  . glucose blood (ACCU-CHEK AVIVA PLUS) test strip CHECK BLOOD SUGAR TWICE DAILY AS DIRECTED    . hydrochlorothiazide (HYDRODIURIL) 25 MG tablet Take 1 tablet (25 mg total) by mouth daily.    . Ibuprofen (ADVIL) 200 MG CAPS Take 400 mg by mouth daily as needed (feet and leg pain).    . Magnesium Oxide 200 MG TABS Take 1 tablet (200 mg total) by mouth 3 (three) times daily with meals. (Patient taking differently: Take 200 mg by mouth daily. )  0  . metFORMIN (GLUCOPHAGE) 500 MG tablet Take 1,000 mg by mouth 2 (two) times daily with a meal.     . Multiple Vitamin (MULTIVITAMIN) tablet Take 1 tablet by mouth. Takes one chewable by mouth daily- GOLI medication     . omeprazole  (PRILOSEC) 20 MG capsule Take 20 mg by mouth daily.     . Psyllium (NATURAL POWDER PO) Take by mouth daily as needed. Super Beets- powder added to a glass of water- takes every day     . sitaGLIPtin (JANUVIA) 100 MG tablet Take 100 mg by mouth daily.    . furosemide (LASIX) 20 MG tablet Take 1 tablet (20 mg total) by mouth daily as needed for fluid. (Patient not taking: Reported on 09/17/2020) 15 tablet 0   No current facility-administered medications for this encounter.    Allergies  Allergen Reactions  . Hydrocodone Itching    Social History   Socioeconomic History  . Marital status: Single    Spouse name: Not on file  . Number of children: 1  . Years of education: Not on file  . Highest education level: Not on file  Occupational History  . Not on file  Tobacco Use  . Smoking status: Former Smoker    Quit date: 2010    Years since quitting: 11.8  . Smokeless tobacco: Never Used  . Tobacco comment: 15-pack-year smoker  Vaping Use  . Vaping Use: Never used  Substance and Sexual Activity  . Alcohol use: No    Comment: rarely  . Drug use: No  . Sexual activity: Not on file  Other Topics Concern  . Not on file  Social History Narrative   Pt. Lives in [place] with her son. 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    Social Determinants of Health   Financial Resource Strain:   . Difficulty of Paying Living Expenses: Not on file  Food Insecurity:   . Worried About Charity fundraiser in the Last Year: Not on file  . Ran Out of Food in the Last Year: Not on file  Transportation Needs:   . Film/video editor (Medical): Not on file  . Lack of Transportation (Non-Medical): Not on file  Physical Activity:   . Days of Exercise per Week: Not on file  . Minutes of Exercise per Session: Not on file  Stress:   . Feeling of Stress : Not on file  Social Connections:   . Frequency of Communication  with Friends and Family: Not on file  . Frequency of Social Gatherings with Friends and Family: Not on file  . Attends Religious Services: Not on file  . Active Member of Clubs or Organizations: Not on file  . Attends Archivist Meetings: Not on file  . Marital Status: Not on file  Intimate Partner Violence:   . Fear of Current or Ex-Partner: Not on file  . Emotionally Abused: Not on file  . Physically Abused: Not on file  . Sexually Abused: Not on file    Family History  Problem Relation Age of Onset  . Cancer Father   . Stomach cancer Father   . Diabetes Other        noninsulin dependant DM  . Hypertension Other   . Stomach cancer Maternal Grandmother   . Colon cancer Neg Hx   . Colon polyps Neg Hx   . Esophageal cancer Neg Hx   . Rectal cancer Neg Hx     ROS- All systems are reviewed and negative except as per the HPI above  Physical Exam: Vitals:   09/17/20 1001  BP: 140/64  Pulse: (!) 118  Weight: 105.8 kg  Height: 5\' 6"  (1.676 m)   Wt Readings from Last 3 Encounters:  09/17/20 105.8 kg  09/04/20 107.2 kg  08/28/20 106.2 kg    Labs: Lab Results  Component Value Date   NA 141 09/04/2020   K 4.5 09/04/2020   CL 104 09/04/2020   CO2 21 (L) 09/04/2020   GLUCOSE 129 (H) 09/04/2020   BUN 12 09/04/2020   CREATININE 0.94 09/04/2020   CALCIUM 8.6 (L) 09/04/2020   MG 1.5 (L) 09/04/2020   Lab Results  Component Value Date   INR 1.0 04/08/2012   Lab Results  Component Value Date   CHOL 159 04/10/2012   HDL 34 (L) 04/10/2012   LDLCALC 79 04/10/2012   TRIG 230 (H) 04/10/2012     GEN- The patient is well appearing, alert and oriented x 3 today.   Head- normocephalic, atraumatic Eyes-  Sclera clear, conjunctiva pink Ears- hearing intact Oropharynx- clear Neck- supple, no JVP Lymph- no cervical lymphadenopathy Lungs- Clear to ausculation bilaterally, normal work of breathing Heart-irregular rate and rhythm, no murmurs, rubs or gallops, PMI  not laterally displaced GI- soft, NT, ND, + BS Extremities- no clubbing, cyanosis, or edema MS- no significant deformity or atrophy Skin- no rash or lesion Psych- euthymic mood, full affect Neuro- strength and sensation are intact  EKG-  afib at 118 bpm, qrs int 90 ms, qtc 381 ms   Cath 2006- CONCLUSION:  1.  Well-preserved overall left ventricular function.  2.  Calcification of mid left anterior descending artery without critical    stenosis.   Echo-08/2017 - Left ventricle: The cavity size was normal. Wall thickness was   increased in a pattern of mild LVH. Systolic function was   vigorous. The estimated ejection fraction was in the range of 65%   to 70%. Left ventricular diastolic function parameters were   normal.attered luminal irregularities throughout the coronary system.   Assessment and  Plan: 1. Persistent  afib  She failed cardioversion 10/6, her afib burden has increased over the last year She was not an candidate for Tikosyn due to hypomagnesemia and borderline qt She has failed amiodarone for GI intolerance, nausea and diarrhea  Pt is symptomatic with fatigue and exertional shortness of breath She will continue diltiazem 360 mg daily  Stop amiodarone and increase metoprolol to 50 mg bid for better rate control CHA2DS2VASc score is at least 5, continue eliquis 5 mg bid   Has had vaccines  She has first degree AV block and IRBBB so  flecainide was not  a good  option    2. HTN Stable   3. Shortness of breath CXR today Update  echo   Appointment with  Dr. Rayann Heman has been requested to discuss ablation    Butch Penny C. Maxamillion Banas, [facility] [address] [place], [place] [address] [phone]

## 2020-09-18 ENCOUNTER — Other Ambulatory Visit (HOSPITAL_COMMUNITY): Payer: Self-pay | Admitting: *Deleted

## 2020-09-18 MED ORDER — FUROSEMIDE 40 MG PO TABS: 40.0000 mg | ORAL_TABLET | Freq: Every day | ORAL | 3 refills | Status: DC

## 2020-09-18 MED ORDER — POTASSIUM CHLORIDE ER 10 MEQ PO TBCR: 10.0000 meq | EXTENDED_RELEASE_TABLET | Freq: Every day | ORAL | 3 refills | Status: DC

## 2020-09-18 MED ORDER — MAGNESIUM OXIDE -MG SUPPLEMENT 200 MG PO TABS
200.0000 mg | ORAL_TABLET | Freq: Every day | ORAL | Status: DC
Start: 2020-09-18 — End: 2020-12-19

## 2020-09-18 NOTE — Addendum Note (Signed)
Encounter addended by: Enid Derry, CMA on: 09/18/2020 1:59 PM  Actions taken: Order list changed

## 2020-09-20 ENCOUNTER — Telehealth: Payer: Self-pay | Admitting: Internal Medicine

## 2020-09-20 NOTE — Telephone Encounter (Signed)
New message   Pt has a question about if she should go back to work or stay out with everything going on with her health. She said her boss thinks that she should until something is figured out for her. Please call

## 2020-09-20 NOTE — Telephone Encounter (Signed)
Discussed with patient. Will follow up on Monday's echo results.

## 2020-09-24 ENCOUNTER — Ambulatory Visit (HOSPITAL_COMMUNITY)
Admission: RE | Admit: 2020-09-24 | Discharge: 2020-09-24 | Disposition: A | Discharge status: Home / Self Care | Payer: No Typology Code available for payment source | Source: Ambulatory Visit | Attending: Nurse Practitioner | Admitting: Nurse Practitioner

## 2020-09-24 ENCOUNTER — Other Ambulatory Visit: Payer: Self-pay

## 2020-09-24 DIAGNOSIS — E785 Hyperlipidemia, unspecified: Secondary | ICD-10-CM | POA: Insufficient documentation

## 2020-09-24 DIAGNOSIS — I4811 Longstanding persistent atrial fibrillation: Secondary | ICD-10-CM | POA: Diagnosis not present

## 2020-09-24 DIAGNOSIS — E119 Type 2 diabetes mellitus without complications: Secondary | ICD-10-CM | POA: Insufficient documentation

## 2020-09-24 DIAGNOSIS — I081 Rheumatic disorders of both mitral and tricuspid valves: Secondary | ICD-10-CM | POA: Diagnosis not present

## 2020-09-24 DIAGNOSIS — I2510 Atherosclerotic heart disease of native coronary artery without angina pectoris: Secondary | ICD-10-CM | POA: Insufficient documentation

## 2020-09-24 DIAGNOSIS — I482 Chronic atrial fibrillation, unspecified: Secondary | ICD-10-CM

## 2020-09-24 DIAGNOSIS — I119 Hypertensive heart disease without heart failure: Secondary | ICD-10-CM | POA: Insufficient documentation

## 2020-09-24 LAB — BASIC METABOLIC PANEL
Anion gap: 14 (ref 5–15)
BUN: 15 mg/dL (ref 8–23)
CO2: 28 mmol/L (ref 22–32)
Calcium: 8.1 mg/dL — ABNORMAL LOW (ref 8.9–10.3)
Chloride: 100 mmol/L (ref 98–111)
Creatinine, Ser: 1.19 mg/dL — ABNORMAL HIGH (ref 0.44–1.00)
GFR, Estimated: 50 mL/min — ABNORMAL LOW (ref >60)
Glucose, Bld: 105 mg/dL — ABNORMAL HIGH (ref 70–99)
Potassium: 4.3 mmol/L (ref 3.5–5.1)
Sodium: 142 mmol/L (ref 135–145)

## 2020-09-24 NOTE — Progress Notes (Signed)
  Echocardiogram 2D Echocardiogram has been performed.  Kayla Hendrix 09/24/2020, 2:47 PM

## 2020-09-25 ENCOUNTER — Other Ambulatory Visit (HOSPITAL_COMMUNITY): Payer: Self-pay | Admitting: *Deleted

## 2020-09-25 LAB — ECHOCARDIOGRAM COMPLETE: S' Lateral: 3.2 cm

## 2020-09-25 MED ORDER — FUROSEMIDE 20 MG PO TABS: 30.0000 mg | ORAL_TABLET | Freq: Every day | ORAL | 3 refills | Status: DC

## 2020-09-25 NOTE — Telephone Encounter (Signed)
Pt notified per echo results patient to return to work and follow up as scheduled with Dr. Rayann Heman. Pt was rate controlled at last appt.

## 2020-09-28 ENCOUNTER — Other Ambulatory Visit (HOSPITAL_COMMUNITY): Payer: Self-pay | Admitting: Nurse Practitioner

## 2020-10-08 ENCOUNTER — Other Ambulatory Visit: Payer: Self-pay

## 2020-10-08 ENCOUNTER — Telehealth (INDEPENDENT_AMBULATORY_CARE_PROVIDER_SITE_OTHER): Payer: No Typology Code available for payment source | Admitting: Internal Medicine

## 2020-10-08 ENCOUNTER — Encounter: Payer: Self-pay | Admitting: Internal Medicine

## 2020-10-08 VITALS — Ht 66.0 in | Wt 232.0 lb

## 2020-10-08 DIAGNOSIS — D6869 Other thrombophilia: Secondary | ICD-10-CM | POA: Diagnosis not present

## 2020-10-08 DIAGNOSIS — G4733 Obstructive sleep apnea (adult) (pediatric): Secondary | ICD-10-CM

## 2020-10-08 DIAGNOSIS — I10 Essential (primary) hypertension: Secondary | ICD-10-CM | POA: Diagnosis not present

## 2020-10-08 DIAGNOSIS — I4819 Other persistent atrial fibrillation: Secondary | ICD-10-CM

## 2020-10-08 NOTE — Progress Notes (Signed)
Electrophysiology TeleHealth Note  Due to national recommendations of social distancing due to [place] 19, an audio telehealth visit is felt to be most appropriate for this patient at this time.  Verbal consent was obtained by me for the telehealth visit today.  The patient does not have capability for a virtual visit.  A phone visit is therefore required today.   Date:  10/08/2020   ID:  Kayla Hendrix, DOB 08-Jun-1951, MRN [id number]  Location: patient's home  Provider location:  [place]  Evaluation Performed: Follow-up visit  PCP:  [name], MD   Electrophysiologist:  Dr Rayann Heman  Chief Complaint:  palpitations  History of Present Illness:    Kayla Hendrix is a 69 y.o. female who presents via telehealth conferencing today.  Since last being seen in our clinic, the patient reports doing reasonably well.  She has persistent afib.  She has fatigue and decreased exercise tolerance.  She tried amiodarone but did not tolerate this due to N/V.  This medicine was stopped. Today, she denies symptoms of palpitations, chest pain, shortness of breath,  lower extremity edema, dizziness, presyncope, or syncope.  The patient is otherwise without complaint today.     Past Medical History:  Diagnosis Date  . Allergy   . Anemia   . Anxiety   . Atrial fibrillation (HCC)    paroxysmal only asa 81 mg, no other blood thinner, the xarelto she was on gave her HAs  . Barrett's esophagus 2018  . CAD (coronary artery disease)    cath 2006- nonobstructive CAD  . Cataract   . Depression   . Diabetes mellitus without complication ([place])   . Gastric polyp   . GERD (gastroesophageal reflux disease)   . Glucose intolerance (impaired glucose tolerance)   . Hiatal hernia   . History of transesophageal echocardiography (TEE) for monitoring    TEE 3/17:  EF 55%, no RWMA, mild plaque in descending aorta, mild MR, mod BAE, normal RVF  . HL (hearing loss)   . HTN (hypertension)   .  Hyperlipidemia   . Hyperplastic colon polyp   . Internal hemorrhoids   . Intestinal metaplasia of gastric mucosa   . Obesity   . OSA (obstructive sleep apnea)    noncompliant with CPAP  . Tubular adenoma of colon     Past Surgical History:  Procedure Laterality Date  . BIOPSY  10/17/2019   Procedure: BIOPSY;  Surgeon: Jerene Bears, MD;  Location: [facility];  Service: Gastroenterology;;  . CARDIOVERSION N/A 01/23/2016   Procedure: CARDIOVERSION;  Surgeon: Larey Dresser, MD;  Location: [facility] ENDOSCOPY;  Service: Cardiovascular;  Laterality: N/A;  . CARDIOVERSION N/A 12/07/2019   Procedure: CARDIOVERSION;  Surgeon: Pixie Casino, MD;  Location: [facility] ENDOSCOPY;  Service: Cardiovascular;  Laterality: N/A;  . CARDIOVERSION N/A 08/15/2020   Procedure: CARDIOVERSION;  Surgeon: Werner Lean, MD;  Location: [facility] ENDOSCOPY;  Service: Cardiovascular;  Laterality: N/A;  . CESAREAN SECTION    . ESOPHAGOGASTRODUODENOSCOPY (EGD) WITH PROPOFOL N/A 10/17/2019   Procedure: ESOPHAGOGASTRODUODENOSCOPY (EGD) WITH PROPOFOL;  Surgeon: Jerene Bears, MD;  Location: [facility] ENDOSCOPY;  Service: Gastroenterology;  Laterality: N/A;  . GANGLION CYST EXCISION Left 1970s  . HEMOSTASIS CLIP PLACEMENT  10/17/2019   Procedure: HEMOSTASIS CLIP PLACEMENT;  Surgeon: Jerene Bears, MD;  Location: [facility];  Service: Gastroenterology;;  . POLYPECTOMY  10/17/2019   Procedure: POLYPECTOMY;  Surgeon: Jerene Bears, MD;  Location: [facility] ENDOSCOPY;  Service: Gastroenterology;;  .  TEE WITHOUT CARDIOVERSION N/A 01/23/2016   Procedure: TRANSESOPHAGEAL ECHOCARDIOGRAM (TEE);  Surgeon: Larey Dresser, MD;  Location: [place];  Service: Cardiovascular;  Laterality: N/A;  . TOTAL ABDOMINAL HYSTERECTOMY    . WRIST FRACTURE SURGERY  2010   wrist fracture repair with metal rod    Current Outpatient Medications  Medication Sig Dispense Refill  . atorvastatin (LIPITOR) 40 MG tablet Take 40 mg by mouth at bedtime.    . clonazePAM  (KLONOPIN) 0.5 MG tablet Take 0.5 mg by mouth daily.     Marland Kitchen diltiazem (TIAZAC) 360 MG 24 hr capsule Take 360 mg by mouth daily.    Marland Kitchen ELIQUIS 5 MG TABS tablet TAKE 1 TABLET BY MOUTH TWICE A DAY 60 tablet 5  . Ferrous Sulfate (IRON) 325 (65 Fe) MG TABS Take 325 mg by mouth daily.     . furosemide (LASIX) 20 MG tablet Take 1.5 tablets (30 mg total) by mouth daily. 45 tablet 3  . gabapentin (NEURONTIN) 300 MG capsule Take 300 mg by mouth 2 (two) times daily.    Marland Kitchen glimepiride (AMARYL) 4 MG tablet Take 4 mg by mouth 2 (two) times daily.  99  . glucose blood (ACCU-CHEK AVIVA PLUS) test strip CHECK BLOOD SUGAR TWICE DAILY AS DIRECTED    . Ibuprofen (ADVIL) 200 MG CAPS Take 400 mg by mouth daily as needed (feet and leg pain).    . Magnesium Oxide 200 MG TABS Take 1 tablet (200 mg total) by mouth daily.    . metFORMIN (GLUCOPHAGE) 500 MG tablet Take 1,000 mg by mouth 2 (two) times daily with a meal.     . Multiple Vitamin (MULTIVITAMIN) tablet Take 1 tablet by mouth. Takes one chewable by mouth daily- GOLI medication     . omeprazole (PRILOSEC) 20 MG capsule Take 20 mg by mouth daily.     . potassium chloride (KLOR-CON) 10 MEQ tablet Take 1 tablet (10 mEq total) by mouth daily. 30 tablet 3  . Psyllium (NATURAL POWDER PO) Take by mouth daily as needed. Super Beets- powder added to a glass of water- takes every day     . sitaGLIPtin (JANUVIA) 100 MG tablet Take 100 mg by mouth daily.    . metoprolol tartrate (LOPRESSOR) 25 MG tablet Take 1 tablet by mouth in the morning and at bedtime.     No current facility-administered medications for this visit.    Allergies:   Hydrocodone   Social History:  The patient  reports that she quit smoking about 11 years ago. She has never used smokeless tobacco. She reports that she does not drink alcohol and does not use drugs.   ROS:  Please see the history of present illness.   All other systems are personally reviewed and negative.    Exam:    Vital Signs:  Ht 5'  6" (1.676 m)   Wt 232 lb (105.2 kg)   BMI 37.45 kg/m   Well sounding, alert and conversant   Labs/Other Tests and Data Reviewed:    Recent Labs: 03/14/2020: TSH 1.888 08/09/2020: Hemoglobin 9.8; Platelets 284 09/04/2020: Magnesium 1.5 09/24/2020: BUN 15; Creatinine, Ser 1.19; Potassium 4.3; Sodium 142   Wt Readings from Last 3 Encounters:  10/08/20 232 lb (105.2 kg)  09/17/20 233 lb 3.2 oz (105.8 kg)  09/04/20 236 lb 6.4 oz (107.2 kg)     Prior office visits, echo, af clinic notes  ASSESSMENT & PLAN:    1.  Persistent atrial fibrillation The patient has symptomatic,  recurrent persistent atrial fibrillation. she has failed medical therapy with amiodarone.  Given low Mg and borderline QT, she is not a candidate for tikosyn. Chads2vasc score is 5.  she is anticoagulated with eliquis . Therapeutic strategies for afib including medicine and ablation were discussed in detail with the patient today. Risk, benefits, and alternatives to EP study and radiofrequency ablation for afib were also discussed in detail today. These risks include but are not limited to stroke, bleeding, vascular damage, tamponade, perforation, damage to the esophagus, lungs, and other structures, pulmonary vein stenosis, worsening renal function, and death. The patient understands these risk and wishes to proceed.  We will therefore proceed with catheter ablation at the next available time.  Carto, ICE, anesthesia are requested for the procedure.  Will also obtain cardiac CT prior to the procedure to exclude LAA thrombus and further evaluate atrial anatomy.  2. HTN Stable No change required today  3. OSA Not compliant with CPAP I worry that that her success with ablation are low  4. Morbid obesity Body mass index is 37.45 kg/m. Lifestyle modification is advised I have reviewed the patients BMI and decreased success rates with ablation at length today.  Weight loss is strongly advised.  Per Guijian et al (PACE  2013; 36: 158-727), patients with BMI 25-29.9 (obese) have a 27% increase in AF recurrence post ablation.  Patients with BMI >30 have a 31% increase in AF recurrence post ablation when compared to those with BMI <25.  Risks, benefits and potential toxicities for medications prescribed and/or refilled reviewed with patient today.   Patient Risk:  after full review of this patients clinical status, I feel that they are at moderate risk at this time.  Today, I have spent 15 minutes with the patient with telehealth technology discussing arrhythmia management .    Army Fossa, MD  10/08/2020 9:37 AM     [facility] [address] [place] [address] [phone] (office) [phone] (fax)

## 2020-10-10 ENCOUNTER — Telehealth: Payer: Self-pay | Admitting: *Deleted

## 2020-10-10 DIAGNOSIS — I4891 Unspecified atrial fibrillation: Secondary | ICD-10-CM

## 2020-10-10 NOTE — Telephone Encounter (Signed)
Left message to call back  

## 2020-10-10 NOTE — Telephone Encounter (Signed)
-----   Message from Thompson Grayer, MD sent at 10/08/2020  9:48 AM EST ----- Afib ablation C/I/A  Cardiac CT

## 2020-10-11 NOTE — Telephone Encounter (Signed)
Left message to call back  

## 2020-10-16 ENCOUNTER — Encounter: Payer: Self-pay | Admitting: *Deleted

## 2020-10-16 NOTE — Telephone Encounter (Signed)
Set up ablation, labs, and covid screening. Sending instruction letter for Cardiac CT and ablation along with work note through [place]. Patient advised to call with any questions after viewing.   Patient verbalized understanding.

## 2020-10-16 NOTE — Telephone Encounter (Signed)
Patient is returning call.  °

## 2020-10-29 ENCOUNTER — Other Ambulatory Visit: Payer: Self-pay

## 2020-10-29 ENCOUNTER — Other Ambulatory Visit: Payer: No Typology Code available for payment source | Admitting: *Deleted

## 2020-10-29 DIAGNOSIS — I4891 Unspecified atrial fibrillation: Secondary | ICD-10-CM

## 2020-10-30 LAB — CBC WITH DIFFERENTIAL/PLATELET
Basophils Absolute: 0.1 10*3/uL (ref 0.0–0.2)
Basos: 1 %
EOS (ABSOLUTE): 0.9 10*3/uL — ABNORMAL HIGH (ref 0.0–0.4)
Eos: 10 %
Hematocrit: 33.4 % — ABNORMAL LOW (ref 34.0–46.6)
Hemoglobin: 9.9 g/dL — ABNORMAL LOW (ref 11.1–15.9)
Immature Grans (Abs): 0 10*3/uL (ref 0.0–0.1)
Immature Granulocytes: 1 %
Lymphocytes Absolute: 2.8 10*3/uL (ref 0.7–3.1)
Lymphs: 32 %
MCH: 21.3 pg — ABNORMAL LOW (ref 26.6–33.0)
MCHC: 29.6 g/dL — ABNORMAL LOW (ref 31.5–35.7)
MCV: 72 fL — ABNORMAL LOW (ref 79–97)
Monocytes Absolute: 0.7 10*3/uL (ref 0.1–0.9)
Monocytes: 8 %
Neutrophils Absolute: 4.2 10*3/uL (ref 1.4–7.0)
Neutrophils: 48 %
Platelets: 293 10*3/uL (ref 150–450)
RBC: 4.65 x10E6/uL (ref 3.77–5.28)
RDW: 16.7 % — ABNORMAL HIGH (ref 11.7–15.4)
WBC: 8.7 10*3/uL (ref 3.4–10.8)

## 2020-10-30 LAB — BASIC METABOLIC PANEL
BUN/Creatinine Ratio: 14 (ref 12–28)
BUN: 12 mg/dL (ref 8–27)
CO2: 30 mmol/L — ABNORMAL HIGH (ref 20–29)
Calcium: 8.4 mg/dL — ABNORMAL LOW (ref 8.7–10.3)
Chloride: 98 mmol/L (ref 96–106)
Creatinine, Ser: 0.85 mg/dL (ref 0.57–1.00)
GFR calc Af Amer: 81 mL/min/{1.73_m2} (ref >59)
GFR calc non Af Amer: 71 mL/min/{1.73_m2} (ref >59)
Glucose: 55 mg/dL — ABNORMAL LOW (ref 65–99)
Potassium: 3.7 mmol/L (ref 3.5–5.2)
Sodium: 144 mmol/L (ref 134–144)

## 2020-11-01 ENCOUNTER — Telehealth: Payer: Self-pay | Admitting: *Deleted

## 2020-11-01 ENCOUNTER — Encounter: Payer: Self-pay | Admitting: *Deleted

## 2020-11-01 NOTE — Telephone Encounter (Signed)
After going over results with the patient she had several questions about her ablation coming up and ct scan. I walked her through the instructions and how to get to the letters in [place] again. She requested a letter for her son's job so he could be with her after the procedure.    All questions answered and letter sent via mychart.  Patient to call with any further questions.

## 2020-11-06 ENCOUNTER — Other Ambulatory Visit (HOSPITAL_COMMUNITY): Payer: Self-pay | Admitting: Nurse Practitioner

## 2020-11-06 ENCOUNTER — Telehealth (HOSPITAL_COMMUNITY): Payer: Self-pay | Admitting: *Deleted

## 2020-11-06 NOTE — Telephone Encounter (Signed)
Attempted to call patient regarding upcoming cardiac CT appointment. °Left message on voicemail with name and callback number ° °[name] RN Navigator Cardiac Imaging °[facility] °[phone] Office °[phone] Cell ° °

## 2020-11-07 ENCOUNTER — Telehealth (HOSPITAL_COMMUNITY): Payer: Self-pay | Admitting: Emergency Medicine

## 2020-11-07 ENCOUNTER — Other Ambulatory Visit (HOSPITAL_COMMUNITY): Payer: Self-pay

## 2020-11-07 ENCOUNTER — Encounter (HOSPITAL_COMMUNITY): Payer: Self-pay

## 2020-11-07 MED ORDER — METOPROLOL TARTRATE 25 MG PO TABS
25.0000 mg | ORAL_TABLET | Freq: Two times a day (BID) | ORAL | 6 refills | Status: DC
Start: 2020-11-07 — End: 2021-02-13

## 2020-11-07 NOTE — Telephone Encounter (Signed)
Attempted to call patient regarding upcoming cardiac CT appointment. °Left message on voicemail with name and callback number °Kalifa Cadden RN Navigator Cardiac Imaging °[facility] °[phone] Office °[phone] Cell ° °

## 2020-11-08 ENCOUNTER — Encounter (HOSPITAL_COMMUNITY): Payer: Self-pay | Admitting: Emergency Medicine

## 2020-11-08 ENCOUNTER — Emergency Department (HOSPITAL_COMMUNITY): Payer: No Typology Code available for payment source

## 2020-11-08 ENCOUNTER — Other Ambulatory Visit: Payer: Self-pay

## 2020-11-08 ENCOUNTER — Emergency Department (HOSPITAL_COMMUNITY)
Admission: EM | Admit: 2020-11-08 | Discharge: 2020-11-08 | Disposition: A | Discharge status: Home / Self Care | Payer: No Typology Code available for payment source | Attending: Emergency Medicine | Admitting: Emergency Medicine

## 2020-11-08 ENCOUNTER — Ambulatory Visit (HOSPITAL_COMMUNITY): Admission: RE | Admit: 2020-11-08 | Payer: No Typology Code available for payment source | Source: Ambulatory Visit

## 2020-11-08 ENCOUNTER — Telehealth (HOSPITAL_COMMUNITY): Payer: Self-pay | Admitting: Emergency Medicine

## 2020-11-08 DIAGNOSIS — R112 Nausea with vomiting, unspecified: Secondary | ICD-10-CM | POA: Diagnosis not present

## 2020-11-08 DIAGNOSIS — R509 Fever, unspecified: Secondary | ICD-10-CM | POA: Diagnosis not present

## 2020-11-08 DIAGNOSIS — I4891 Unspecified atrial fibrillation: Secondary | ICD-10-CM | POA: Insufficient documentation

## 2020-11-08 DIAGNOSIS — Z5321 Procedure and treatment not carried out due to patient leaving prior to being seen by health care provider: Secondary | ICD-10-CM | POA: Diagnosis not present

## 2020-11-08 LAB — BASIC METABOLIC PANEL
Anion gap: 14 (ref 5–15)
BUN: 17 mg/dL (ref 8–23)
CO2: 25 mmol/L (ref 22–32)
Calcium: 7.9 mg/dL — ABNORMAL LOW (ref 8.9–10.3)
Chloride: 100 mmol/L (ref 98–111)
Creatinine, Ser: 1.13 mg/dL — ABNORMAL HIGH (ref 0.44–1.00)
GFR, Estimated: 53 mL/min — ABNORMAL LOW (ref >60)
Glucose, Bld: 105 mg/dL — ABNORMAL HIGH (ref 70–99)
Potassium: 3.5 mmol/L (ref 3.5–5.1)
Sodium: 139 mmol/L (ref 135–145)

## 2020-11-08 LAB — CBC
HCT: 36.2 % (ref 36.0–46.0)
Hemoglobin: 11.2 g/dL — ABNORMAL LOW (ref 12.0–15.0)
MCH: 22.3 pg — ABNORMAL LOW (ref 26.0–34.0)
MCHC: 30.9 g/dL (ref 30.0–36.0)
MCV: 72 fL — ABNORMAL LOW (ref 80.0–100.0)
Platelets: 304 10*3/uL (ref 150–400)
RBC: 5.03 MIL/uL (ref 3.87–5.11)
RDW: 17.3 % — ABNORMAL HIGH (ref 11.5–15.5)
WBC: 8.1 10*3/uL (ref 4.0–10.5)
nRBC: 0 % (ref 0.0–0.2)

## 2020-11-08 NOTE — Telephone Encounter (Signed)
Pt calling regarding her appt this afternoon for a pulm vein CTA for upcoming ablation on 11/15/20 with Dr. Johney Frame.   Pt states yesterday she had some n/v and today with a slight fever. Having issues with a lower than normal CBG. Pt states she called her PCP who recommended that she go to the nearest ER. Pt concerned that she will miss her appt this afternoon for CT scan.   I told her that if she has any symptoms of a fever or other viral symptoms, we would request she cancel her appt anyways as to not spread any germs.   Pt wondering if she can get her scan while being seen in the ER or if she will need to r/s her scan for early next week or make other arrangements for an ablation procedure without the CT scan.  I encouraged her to proceed to the nearest ER if she is feeling bad and cannot control her CBG.   Rockwell Alexandria RN Navigator Cardiac Imaging [facility] [phone] Office  [phone] Cell

## 2020-11-08 NOTE — ED Triage Notes (Addendum)
Pt reports hx of afib, states fever at home, nausea and vomiting this morning. afib rvr in triage, afebrile. A/ox4, resp e/u, nad. Had ibuprofen last night. Supposed to have ablation next week. Denies cough, or loss of taste or smell, no diarrhea.

## 2020-11-08 NOTE — ED Notes (Signed)
unable to find pt in waiting room for vital signs

## 2020-11-09 ENCOUNTER — Other Ambulatory Visit (HOSPITAL_COMMUNITY): Payer: Self-pay | Admitting: Nurse Practitioner

## 2020-11-12 ENCOUNTER — Other Ambulatory Visit: Payer: Self-pay

## 2020-11-12 ENCOUNTER — Ambulatory Visit (HOSPITAL_COMMUNITY): Payer: No Typology Code available for payment source

## 2020-11-12 ENCOUNTER — Ambulatory Visit (HOSPITAL_COMMUNITY)
Admission: RE | Admit: 2020-11-12 | Discharge: 2020-11-12 | Disposition: A | Discharge status: Home / Self Care | Payer: BC Managed Care – PPO | Source: Ambulatory Visit | Attending: Internal Medicine | Admitting: Internal Medicine

## 2020-11-12 DIAGNOSIS — I4891 Unspecified atrial fibrillation: Secondary | ICD-10-CM | POA: Diagnosis not present

## 2020-11-12 MED ORDER — IOHEXOL 350 MG/ML SOLN
80.0000 mL | Freq: Once | INTRAVENOUS | Status: AC | PRN
  Administered 2020-11-12: 80 mL via INTRAVENOUS

## 2020-11-12 NOTE — Telephone Encounter (Signed)
Called to check in with the patient and see how she was feeling. She states she is feeling much better and very grateful for the call.

## 2020-11-14 ENCOUNTER — Other Ambulatory Visit (HOSPITAL_COMMUNITY)
Admission: RE | Admit: 2020-11-14 | Discharge: 2020-11-14 | Disposition: A | Discharge status: Home / Self Care | Payer: BC Managed Care – PPO | Source: Ambulatory Visit | Attending: Internal Medicine | Admitting: Internal Medicine

## 2020-11-14 DIAGNOSIS — I10 Essential (primary) hypertension: Secondary | ICD-10-CM | POA: Diagnosis not present

## 2020-11-14 DIAGNOSIS — E1136 Type 2 diabetes mellitus with diabetic cataract: Secondary | ICD-10-CM | POA: Diagnosis not present

## 2020-11-14 DIAGNOSIS — Z01812 Encounter for preprocedural laboratory examination: Secondary | ICD-10-CM | POA: Insufficient documentation

## 2020-11-14 DIAGNOSIS — Z6837 Body mass index (BMI) 37.0-37.9, adult: Secondary | ICD-10-CM | POA: Diagnosis not present

## 2020-11-14 DIAGNOSIS — Z20822 Contact with and (suspected) exposure to covid-19: Secondary | ICD-10-CM | POA: Insufficient documentation

## 2020-11-14 DIAGNOSIS — Z87891 Personal history of nicotine dependence: Secondary | ICD-10-CM | POA: Diagnosis not present

## 2020-11-14 DIAGNOSIS — Z86010 Personal history of colonic polyps: Secondary | ICD-10-CM | POA: Diagnosis not present

## 2020-11-14 DIAGNOSIS — Z79899 Other long term (current) drug therapy: Secondary | ICD-10-CM | POA: Diagnosis not present

## 2020-11-14 DIAGNOSIS — I4819 Other persistent atrial fibrillation: Secondary | ICD-10-CM | POA: Diagnosis not present

## 2020-11-14 DIAGNOSIS — Z7901 Long term (current) use of anticoagulants: Secondary | ICD-10-CM | POA: Diagnosis not present

## 2020-11-14 DIAGNOSIS — Z885 Allergy status to narcotic agent status: Secondary | ICD-10-CM | POA: Diagnosis not present

## 2020-11-14 DIAGNOSIS — E669 Obesity, unspecified: Secondary | ICD-10-CM | POA: Diagnosis not present

## 2020-11-14 DIAGNOSIS — Z7984 Long term (current) use of oral hypoglycemic drugs: Secondary | ICD-10-CM | POA: Diagnosis not present

## 2020-11-14 DIAGNOSIS — I2510 Atherosclerotic heart disease of native coronary artery without angina pectoris: Secondary | ICD-10-CM | POA: Diagnosis not present

## 2020-11-14 LAB — SARS CORONAVIRUS 2 (TAT 6-24 HRS): SARS Coronavirus 2: NEGATIVE

## 2020-11-14 NOTE — Progress Notes (Signed)
Attempted to call patient regarding procedure instructions for tomorrow.  Left voicemail on the following items: Arrival time 0530 Nothing to eat or drink after midnight No meds AM of procedure Responsible person to drive you home and stay with you for 24 hrs  Have you missed any doses of anti-coagulant Eliquis

## 2020-11-15 ENCOUNTER — Encounter (HOSPITAL_COMMUNITY)
Admission: RE | Disposition: A | Discharge status: Home / Self Care | Payer: BC Managed Care – PPO | Source: Home / Self Care | Attending: Internal Medicine

## 2020-11-15 ENCOUNTER — Encounter (HOSPITAL_COMMUNITY): Payer: Self-pay | Admitting: Internal Medicine

## 2020-11-15 ENCOUNTER — Ambulatory Visit (HOSPITAL_COMMUNITY)
Admission: RE | Admit: 2020-11-15 | Discharge: 2020-11-15 | Disposition: A | Discharge status: Home / Self Care | Payer: BC Managed Care – PPO | Attending: Internal Medicine | Admitting: Internal Medicine

## 2020-11-15 ENCOUNTER — Ambulatory Visit (HOSPITAL_COMMUNITY): Payer: BC Managed Care – PPO | Admitting: Certified Registered Nurse Anesthetist

## 2020-11-15 DIAGNOSIS — E669 Obesity, unspecified: Secondary | ICD-10-CM | POA: Diagnosis not present

## 2020-11-15 DIAGNOSIS — I10 Essential (primary) hypertension: Secondary | ICD-10-CM | POA: Insufficient documentation

## 2020-11-15 DIAGNOSIS — Z79899 Other long term (current) drug therapy: Secondary | ICD-10-CM | POA: Diagnosis not present

## 2020-11-15 DIAGNOSIS — I4819 Other persistent atrial fibrillation: Secondary | ICD-10-CM | POA: Insufficient documentation

## 2020-11-15 DIAGNOSIS — Z20822 Contact with and (suspected) exposure to covid-19: Secondary | ICD-10-CM | POA: Diagnosis not present

## 2020-11-15 DIAGNOSIS — Z885 Allergy status to narcotic agent status: Secondary | ICD-10-CM | POA: Insufficient documentation

## 2020-11-15 DIAGNOSIS — E1136 Type 2 diabetes mellitus with diabetic cataract: Secondary | ICD-10-CM | POA: Diagnosis not present

## 2020-11-15 DIAGNOSIS — I2510 Atherosclerotic heart disease of native coronary artery without angina pectoris: Secondary | ICD-10-CM | POA: Diagnosis not present

## 2020-11-15 DIAGNOSIS — Z86010 Personal history of colonic polyps: Secondary | ICD-10-CM | POA: Insufficient documentation

## 2020-11-15 DIAGNOSIS — Z7984 Long term (current) use of oral hypoglycemic drugs: Secondary | ICD-10-CM | POA: Insufficient documentation

## 2020-11-15 DIAGNOSIS — Z87891 Personal history of nicotine dependence: Secondary | ICD-10-CM | POA: Diagnosis not present

## 2020-11-15 DIAGNOSIS — I4891 Unspecified atrial fibrillation: Secondary | ICD-10-CM | POA: Diagnosis not present

## 2020-11-15 DIAGNOSIS — Z6837 Body mass index (BMI) 37.0-37.9, adult: Secondary | ICD-10-CM | POA: Diagnosis not present

## 2020-11-15 DIAGNOSIS — Z7901 Long term (current) use of anticoagulants: Secondary | ICD-10-CM | POA: Diagnosis not present

## 2020-11-15 DIAGNOSIS — E7800 Pure hypercholesterolemia, unspecified: Secondary | ICD-10-CM | POA: Diagnosis not present

## 2020-11-15 HISTORY — PX: ATRIAL FIBRILLATION ABLATION: EP1191

## 2020-11-15 LAB — GLUCOSE, CAPILLARY
Glucose-Capillary: 131 mg/dL — ABNORMAL HIGH (ref 70–99)
Glucose-Capillary: 105 mg/dL — ABNORMAL HIGH (ref 70–99)

## 2020-11-15 LAB — POCT ACTIVATED CLOTTING TIME
Activated Clotting Time: 243 seconds
Activated Clotting Time: 297 seconds

## 2020-11-15 SURGERY — ATRIAL FIBRILLATION ABLATION
Anesthesia: General

## 2020-11-15 MED ORDER — DEXAMETHASONE SODIUM PHOSPHATE 10 MG/ML IJ SOLN
INTRAMUSCULAR | Status: DC | PRN
  Administered 2020-11-15: 5 mg via INTRAVENOUS

## 2020-11-15 MED ORDER — MIDAZOLAM HCL 2 MG/2ML IJ SOLN
INTRAMUSCULAR | Status: DC | PRN
  Administered 2020-11-15: 2 mg via INTRAVENOUS

## 2020-11-15 MED ORDER — PHENYLEPHRINE 40 MCG/ML (10ML) SYRINGE FOR IV PUSH (FOR BLOOD PRESSURE SUPPORT)
PREFILLED_SYRINGE | INTRAVENOUS | Status: DC | PRN
  Administered 2020-11-15 (×2): 80 ug via INTRAVENOUS

## 2020-11-15 MED ORDER — HEPARIN SODIUM (PORCINE) 1000 UNIT/ML IJ SOLN
INTRAMUSCULAR | Status: DC | PRN
  Administered 2020-11-15: 3000 [IU] via INTRAVENOUS
  Administered 2020-11-15: 5000 [IU] via INTRAVENOUS

## 2020-11-15 MED ORDER — LACTATED RINGERS IV SOLN: INTRAVENOUS | Status: DC | PRN

## 2020-11-15 MED ORDER — HEPARIN (PORCINE) IN NACL 1000-0.9 UT/500ML-% IV SOLN
INTRAVENOUS | Status: DC | PRN
  Administered 2020-11-15: 500 mL

## 2020-11-15 MED ORDER — HEPARIN SODIUM (PORCINE) 1000 UNIT/ML IJ SOLN
INTRAMUSCULAR | Status: DC | PRN
  Administered 2020-11-15: 1000 [IU] via INTRAVENOUS
  Administered 2020-11-15: 14000 [IU] via INTRAVENOUS

## 2020-11-15 MED ORDER — ONDANSETRON HCL 4 MG/2ML IJ SOLN
INTRAMUSCULAR | Status: DC | PRN
  Administered 2020-11-15: 4 mg via INTRAVENOUS

## 2020-11-15 MED ORDER — PROTAMINE SULFATE 10 MG/ML IV SOLN
INTRAVENOUS | Status: DC | PRN
  Administered 2020-11-15: 40 mg via INTRAVENOUS

## 2020-11-15 MED ORDER — SUCCINYLCHOLINE CHLORIDE 200 MG/10ML IV SOSY
PREFILLED_SYRINGE | INTRAVENOUS | Status: DC | PRN
  Administered 2020-11-15: 150 mg via INTRAVENOUS

## 2020-11-15 MED ORDER — SODIUM CHLORIDE 0.9% FLUSH: 3.0000 mL | INTRAVENOUS | Status: DC | PRN

## 2020-11-15 MED ORDER — HEPARIN SODIUM (PORCINE) 1000 UNIT/ML IJ SOLN
INTRAMUSCULAR | Status: AC
  Filled 2020-11-15: qty 1

## 2020-11-15 MED ORDER — ONDANSETRON HCL 4 MG/2ML IJ SOLN: 4.0000 mg | Freq: Four times a day (QID) | INTRAMUSCULAR | Status: DC | PRN

## 2020-11-15 MED ORDER — PHENYLEPHRINE HCL-NACL 10-0.9 MG/250ML-% IV SOLN
INTRAVENOUS | Status: DC | PRN
  Administered 2020-11-15: 40 ug/min via INTRAVENOUS

## 2020-11-15 MED ORDER — APIXABAN 5 MG PO TABS
5.0000 mg | ORAL_TABLET | Freq: Once | ORAL | Status: AC
  Administered 2020-11-15: 5 mg via ORAL
  Filled 2020-11-15: qty 1

## 2020-11-15 MED ORDER — SODIUM CHLORIDE 0.9% FLUSH: 3.0000 mL | Freq: Two times a day (BID) | INTRAVENOUS | Status: DC

## 2020-11-15 MED ORDER — LIDOCAINE 2% (20 MG/ML) 5 ML SYRINGE
INTRAMUSCULAR | Status: DC | PRN
  Administered 2020-11-15: 40 mg via INTRAVENOUS

## 2020-11-15 MED ORDER — ROCURONIUM BROMIDE 10 MG/ML (PF) SYRINGE
PREFILLED_SYRINGE | INTRAVENOUS | Status: DC | PRN
  Administered 2020-11-15: 50 mg via INTRAVENOUS

## 2020-11-15 MED ORDER — PROPOFOL 10 MG/ML IV BOLUS
INTRAVENOUS | Status: DC | PRN
  Administered 2020-11-15: 120 mg via INTRAVENOUS

## 2020-11-15 MED ORDER — SUGAMMADEX SODIUM 200 MG/2ML IV SOLN
INTRAVENOUS | Status: DC | PRN
  Administered 2020-11-15: 200 mg via INTRAVENOUS

## 2020-11-15 MED ORDER — SODIUM CHLORIDE 0.9 % IV SOLN: INTRAVENOUS | Status: DC

## 2020-11-15 MED ORDER — FENTANYL CITRATE (PF) 250 MCG/5ML IJ SOLN
INTRAMUSCULAR | Status: DC | PRN
  Administered 2020-11-15: 100 ug via INTRAVENOUS

## 2020-11-15 MED ORDER — ACETAMINOPHEN 325 MG PO TABS: 650.0000 mg | ORAL_TABLET | ORAL | Status: DC | PRN

## 2020-11-15 MED ORDER — SODIUM CHLORIDE 0.9 % IV SOLN: 250.0000 mL | INTRAVENOUS | Status: DC | PRN

## 2020-11-15 SURGICAL SUPPLY — 24 items
BLANKET WARM UNDERBOD FULL ACC (MISCELLANEOUS) ×2 IMPLANT
CATH 8FR REPROCESSED SOUNDSTAR (CATHETERS) ×2 IMPLANT
CATH 8FR SOUNDSTAR REPROCESSED (CATHETERS) IMPLANT
CATH ACUITY  CS-EH 54CM 7067 (CATHETERS) ×2
CATH ACUITY CS-EH 54CM 7067 (CATHETERS) IMPLANT
CATH MAPPNG PENTARAY F 2-6-2MM (CATHETERS) IMPLANT
CATH SMTCH THERMOCOOL SF DF (CATHETERS) ×1 IMPLANT
CATH WEB BI DIR CSDF CRV REPRO (CATHETERS) ×1 IMPLANT
CLOSURE PERCLOSE PROSTYLE (VASCULAR PRODUCTS) ×4 IMPLANT
COVER DOME SNAP 22 D (MISCELLANEOUS) ×1 IMPLANT
COVER SWIFTLINK CONNECTOR (BAG) ×2 IMPLANT
MAT PREVALON FULL STRYKER (MISCELLANEOUS) ×1 IMPLANT
NDL BAYLIS TRANSSEPTAL 71CM (NEEDLE) IMPLANT
NEEDLE BAYLIS TRANSSEPTAL 71CM (NEEDLE) ×2 IMPLANT
PACK EP LATEX FREE (CUSTOM PROCEDURE TRAY) ×2
PACK EP LF (CUSTOM PROCEDURE TRAY) ×1 IMPLANT
PAD PRO RADIOLUCENT 2001M-C (PAD) ×2 IMPLANT
PATCH CARTO3 (PAD) ×1 IMPLANT
PENTARAY F 2-6-2MM (CATHETERS) ×2
SHEATH PINNACLE 7F 10CM (SHEATH) ×2 IMPLANT
SHEATH PINNACLE 9F 10CM (SHEATH) ×1 IMPLANT
SHEATH PROBE COVER 6X72 (BAG) ×1 IMPLANT
SHEATH SWARTZ TS SL2 63CM 8.5F (SHEATH) ×1 IMPLANT
TUBING SMART ABLATE COOLFLOW (TUBING) ×1 IMPLANT

## 2020-11-15 NOTE — H&P (Signed)
Chief Complaint:  palpitations  History of Present Illness:    Kayla Hendrix is a 70 y.o. female who presents for afib ablation. Since last being seen in our clinic, the patient reports doing reasonably well.  She has persistent afib.  She has fatigue and decreased exercise tolerance.  She tried amiodarone but did not tolerate this due to N/V.  This medicine was stopped. Today, she denies symptoms of palpitations, chest pain, shortness of breath,  lower extremity edema, dizziness, presyncope, or syncope.  The patient is otherwise without complaint today.         Past Medical History:  Diagnosis Date  . Allergy   . Anemia   . Anxiety   . Atrial fibrillation (HCC)    paroxysmal only asa 81 mg, no other blood thinner, the xarelto she was on gave her HAs  . Barrett's esophagus 2018  . CAD (coronary artery disease)    cath 2006- nonobstructive CAD  . Cataract   . Depression   . Diabetes mellitus without complication ([place])   . Gastric polyp   . GERD (gastroesophageal reflux disease)   . Glucose intolerance (impaired glucose tolerance)   . Hiatal hernia   . History of transesophageal echocardiography (TEE) for monitoring    TEE 3/17:  EF 55%, no RWMA, mild plaque in descending aorta, mild MR, mod BAE, normal RVF  . HL (hearing loss)   . HTN (hypertension)   . Hyperlipidemia   . Hyperplastic colon polyp   . Internal hemorrhoids   . Intestinal metaplasia of gastric mucosa   . Obesity   . OSA (obstructive sleep apnea)    noncompliant with CPAP  . Tubular adenoma of colon          Past Surgical History:  Procedure Laterality Date  . BIOPSY  10/17/2019   Procedure: BIOPSY;  Surgeon: Jerene Bears, MD;  Location: [facility];  Service: Gastroenterology;;  . CARDIOVERSION N/A 01/23/2016   Procedure: CARDIOVERSION;  Surgeon: Larey Dresser, MD;  [facility] END[facility]COPY;  Service: Cardiovascular;  Laterality: N/A;  . CARDIOVERSION N/A 12/07/2019    Procedure: CARDIOVERSION;  Surgeon: Pixie Casino, MD;  Location: [facility] ENDOSCOPY;  Service: Cardiovascular;  Laterality: N/A;  . CARDIOVERSION N/A 08/15/2020   Procedure: CARDIOVERSION;  Surgeon: Werner Lean, MD;  Location: [facility] ENDOSCOPY;  Service: Cardiovascular;  Laterality: N/A;  . CESAREAN SECTION    . ESOPHAGOGASTRODUODENOSCOPY (EGD) WITH PROPOFOL N/A 10/17/2019   Procedure: ESOPHAGOGASTRODUODENOSCOPY (EGD) WITH PROPOFOL;  Surgeon: Jerene Bears, MD;  Location: [facility] ENDOSCOPY;  Service: Gastroenterology;  Laterality: N/A;  . GANGLION CYST EXCISION Left 1970s  . HEMOSTASIS CLIP PLACEMENT  10/17/2019   Procedure: HEMOSTASIS CLIP PLACEMENT;  Surgeon: Jerene Bears, MD;  Location: [facility] ENDOSCOPY;  Service: Gastroenterology;;  . POLYPECTOMY  10/17/2019   Procedure: POLYPECTOMY;  Surgeon: Jerene Bears, MD;  Location: [facility] ENDOSCOPY;  Service: Gastroenterology;;  . TEE WITHOUT CARDIOVERSION N/A 01/23/2016   Procedure: TRANSESOPHAGEAL ECHOCARDIOGRAM (TEE);  Surgeon: Larey Dresser, MD;  Location: [place];  Service: Cardiovascular;  Laterality: N/A;  . TOTAL ABDOMINAL HYSTERECTOMY    . WRIST FRACTURE SURGERY  2010   wrist fracture repair with metal rod          Current Outpatient Medications  Medication Sig Dispense Refill  . atorvastatin (LIPITOR) 40 MG tablet Take 40 mg by mouth at bedtime.    . clonazePAM (KLONOPIN) 0.5 MG tablet Take 0.5 mg by mouth daily.     Marland Kitchen diltiazem (  TIAZAC) 360 MG 24 hr capsule Take 360 mg by mouth daily.    Marland Kitchen ELIQUIS 5 MG TABS tablet TAKE 1 TABLET BY MOUTH TWICE A DAY 60 tablet 5  . Ferrous Sulfate (IRON) 325 (65 Fe) MG TABS Take 325 mg by mouth daily.     . furosemide (LASIX) 20 MG tablet Take 1.5 tablets (30 mg total) by mouth daily. 45 tablet 3  . gabapentin (NEURONTIN) 300 MG capsule Take 300 mg by mouth 2 (two) times daily.    Marland Kitchen glimepiride (AMARYL) 4 MG tablet Take 4 mg by mouth 2 (two) times daily.  99  . glucose blood  (ACCU-CHEK AVIVA PLUS) test strip CHECK BLOOD SUGAR TWICE DAILY AS DIRECTED    . Ibuprofen (ADVIL) 200 MG CAPS Take 400 mg by mouth daily as needed (feet and leg pain).    . Magnesium Oxide 200 MG TABS Take 1 tablet (200 mg total) by mouth daily.    . metFORMIN (GLUCOPHAGE) 500 MG tablet Take 1,000 mg by mouth 2 (two) times daily with a meal.     . Multiple Vitamin (MULTIVITAMIN) tablet Take 1 tablet by mouth. Takes one chewable by mouth daily- GOLI medication     . omeprazole (PRILOSEC) 20 MG capsule Take 20 mg by mouth daily.     . potassium chloride (KLOR-CON) 10 MEQ tablet Take 1 tablet (10 mEq total) by mouth daily. 30 tablet 3  . Psyllium (NATURAL POWDER PO) Take by mouth daily as needed. Super Beets- powder added to a glass of water- takes every day     . sitaGLIPtin (JANUVIA) 100 MG tablet Take 100 mg by mouth daily.    . metoprolol tartrate (LOPRESSOR) 25 MG tablet Take 1 tablet by mouth in the morning and at bedtime.     No current facility-administered medications for this visit.    Allergies:   Hydrocodone   Social History:  The patient  reports that she quit smoking about 11 years ago. She has never used smokeless tobacco. She reports that she does not drink alcohol and does not use drugs.   ROS:  Please see the history of present illness.   All other systems are personally reviewed and negative.    Exam:    Physical Exam: Vitals:   11/15/20 0557  BP: (!) 102/59  Pulse: (!) 57  Resp: 17  Temp: 98.1 F (36.7 C)  TempSrc: Oral  SpO2: 96%  Weight: 105.2 kg  Height: 5\' 6"  (1.676 m)    GEN- The patient is well appearing, alert and oriented x 3 today.   Head- normocephalic, atraumatic Eyes-  Sclera clear, conjunctiva pink Ears- hearing intact Oropharynx- clear Neck- supple, Lungs- Clear to ausculation bilaterally, normal work of breathing Heart- irrregular rate and rhythm  GI- soft, NT, ND, + BS Extremities- no clubbing, cyanosis, or  edema, groin is without hematoma/ bruit MS- no significant deformity or atrophy Skin- no rash or lesion Psych- euthymic mood, full affect Neuro- strength and sensation are intact   Labs/Other Tests and Data Reviewed:    Recent Labs: 03/14/2020: TSH 1.888 08/09/2020: Hemoglobin 9.8; Platelets 284 09/04/2020: Magnesium 1.5 09/24/2020: BUN 15; Creatinine, Ser 1.19; Potassium 4.3; Sodium 142      Wt Readings from Last 3 Encounters:  10/08/20 232 lb (105.2 kg)  09/17/20 233 lb 3.2 oz (105.8 kg)  09/04/20 236 lb 6.4 oz (107.2 kg)    Prior office visits, echo, af clinic notes  ASSESSMENT & PLAN:    1.  Persistent atrial fibrillation The patient has symptomatic, recurrent persistent atrial fibrillation. she has failed medical therapy with amiodarone.   Risk, benefits, and alternatives to EP study and radiofrequency ablation for afib were also discussed in detail today. These risks include but are not limited to stroke, bleeding, vascular damage, tamponade, perforation, damage to the esophagus, lungs, and other structures, pulmonary vein stenosis, worsening renal function, and death. The patient understands these risk and wishes to proceed.     Hillis Range MD, [facility] 11/15/2020 7:29 AM

## 2020-11-15 NOTE — Discharge Instructions (Signed)
Post procedure care instructions No driving for 4 days. No lifting over 5 lbs for 1 week. No vigorous or sexual activity for 1 week. You may return to work/your usual activities on 11/22/20. Keep procedure site clean & dry. If you notice increased pain, swelling, bleeding or pus, call/return!  You may shower after 24 hours, but no soaking in baths/hot tubs/pools for 1 week.      Cardiac Ablation, Care After  This sheet gives you information about how to care for yourself after your procedure. Your health care provider may also give you more specific instructions. If you have problems or questions, contact your health care provider. What can I expect after the procedure? After the procedure, it is common to have:  Bruising around your puncture site.  Tenderness around your puncture site.  Skipped heartbeats.  Tiredness (fatigue).  Follow these instructions at home: Puncture site care   Follow instructions from your health care provider about how to take care of your puncture site. Make sure you: ? If present, leave stitches (sutures), skin glue, or adhesive strips in place. These skin closures may need to stay in place for up to 2 weeks. If adhesive strip edges start to loosen and curl up, you may trim the loose edges. Do not remove adhesive strips completely unless your health care provider tells you to do that. ? If a large square bandage is present, this may be removed 24 hours after surgery.   Check your puncture site every day for signs of infection. Check for: ? Redness, swelling, or pain. ? Fluid or blood. If your puncture site starts to bleed, lie down on your back, apply firm pressure to the area, and contact your health care provider. ? Warmth. ? Pus or a bad smell. Driving  Do not drive for at least 4 days after your procedure or however long your health care provider recommends. (Do not resume driving if you have previously been instructed not to drive for other health  reasons.)  Do not drive or use heavy machinery while taking prescription pain medicine. Activity  Avoid activities that take a lot of effort for at least 7 days after your procedure.  Do not lift anything that is heavier than 5 lb (4.5 kg) for one week.   No sexual activity for 1 week.   Return to your normal activities as told by your health care provider. Ask your health care provider what activities are safe for you. General instructions  Take over-the-counter and prescription medicines only as told by your health care provider.  Do not use any products that contain nicotine or tobacco, such as cigarettes and e-cigarettes. If you need help quitting, ask your health care provider.  You may shower after 24 hours, but Do not take baths, swim, or use a hot tub for 1 week.   Do not drink alcohol for 24 hours after your procedure.  Keep all follow-up visits as told by your health care provider. This is important. Contact a health care provider if:  You have redness, mild swelling, or pain around your puncture site.  You have fluid or blood coming from your puncture site that stops after applying firm pressure to the area.  Your puncture site feels warm to the touch.  You have pus or a bad smell coming from your puncture site.  You have a fever.  You have chest pain or discomfort that spreads to your neck, jaw, or arm.  You are sweating a lot.  You feel nauseous.  You have a fast or irregular heartbeat.  You have shortness of breath.  You are dizzy or light-headed and feel the need to lie down.  You have pain or numbness in the arm or leg closest to your puncture site. Get help right away if:  Your puncture site suddenly swells.  Your puncture site is bleeding and the bleeding does not stop after applying firm pressure to the area. These symptoms may represent a serious problem that is an emergency. Do not wait to see if the symptoms will go away. Get medical help  right away. Call your local emergency services (911 in the [place] Do not drive yourself to the hospital. Summary  After the procedure, it is normal to have bruising and tenderness at the puncture site in your groin, neck, or forearm.  Check your puncture site every day for signs of infection.  Get help right away if your puncture site is bleeding and the bleeding does not stop after applying firm pressure to the area. This is a medical emergency. This information is not intended to replace advice given to you by your health care provider. Make sure you discuss any questions you have with your health care provider.    You have an appointment set up with the [facility].  Multiple studies have shown that being followed by a dedicated atrial fibrillation clinic in addition to the standard care you receive from your other physicians improves health. We believe that enrollment in the atrial fibrillation clinic will allow [place] to better care for you.   The phone number to the [facility] is [phone]. The clinic is staffed Monday through Friday from 8:30am to 5pm.  Parking Directions: The clinic is located in the Heart and Vascular Building connected to [facility]. 1)From [address] turn on to [address] and go to the 3rd entrance  (Heart and Vascular entrance) on the right. 2)Look to the right for Heart &Vascular Parking Garage. 3)A code for the entrance is required, for February is 1212.   4)Take the elevators to the 1st floor. Registration is in the room with the glass walls at the end of the hallway.  If you have any trouble parking or locating the clinic, please don't hesitate to call [phone].

## 2020-11-15 NOTE — Progress Notes (Signed)
Patient was given discharge instructions. She verbalized understanding. 

## 2020-11-15 NOTE — Anesthesia Postprocedure Evaluation (Signed)
Anesthesia Post Note  Patient: Kayla Hendrix  Procedure(s) Performed: ATRIAL FIBRILLATION ABLATION (N/A )     Patient location during evaluation: PACU Anesthesia Type: General Level of consciousness: awake and alert Pain management: pain level controlled Vital Signs Assessment: post-procedure vital signs reviewed and stable Respiratory status: spontaneous breathing, nonlabored ventilation, respiratory function stable and patient connected to nasal cannula oxygen Cardiovascular status: blood pressure returned to baseline and stable Postop Assessment: no apparent nausea or vomiting Anesthetic complications: no   No complications documented.  Last Vitals:  Vitals:   11/15/20 1145 11/15/20 1200  BP: (!) 124/44 (!) 131/56  Pulse: 78 78  Resp: 14 17  Temp:    SpO2: 93% 90%    Last Pain:  Vitals:   11/15/20 1120  TempSrc:   PainSc: 0-No pain                 Shelton Silvas

## 2020-11-15 NOTE — Addendum Note (Signed)
Addendum  created 11/15/20 1338 by Tressia Miners, CRNA   Charge Capture section accepted

## 2020-11-15 NOTE — Anesthesia Preprocedure Evaluation (Addendum)
Anesthesia Evaluation  Patient identified by MRN, date of birth, ID band Patient awake    Reviewed: Allergy & Precautions, NPO status , Patient's Chart, lab work & pertinent test results  Airway Mallampati: II  TM Distance: >3 FB Neck ROM: Full    Dental  (+) Dental Advisory Given, Missing, Poor Dentition, Chipped,    Pulmonary sleep apnea , former smoker,    breath sounds clear to auscultation       Cardiovascular hypertension, + CAD  + dysrhythmias Atrial Fibrillation  Rhythm:Irregular Rate:Normal     Neuro/Psych PSYCHIATRIC DISORDERS Anxiety Depression negative neurological ROS     GI/Hepatic Neg liver ROS, hiatal hernia, GERD  ,  Endo/Other  diabetes  Renal/GU Renal InsufficiencyRenal disease     Musculoskeletal negative musculoskeletal ROS (+)   Abdominal Normal abdominal exam  (+)   Peds  Hematology   Anesthesia Other Findings   Reproductive/Obstetrics                          Anesthesia Physical Anesthesia Plan  ASA: III  Anesthesia Plan: General   Post-op Pain Management:    Induction: Intravenous  PONV Risk Score and Plan: 4 or greater and Ondansetron, Dexamethasone, Midazolam and Scopolamine patch - Pre-op  Airway Management Planned: Oral ETT  Additional Equipment: None  Intra-op Plan:   Post-operative Plan: Extubation in OR  Informed Consent: I have reviewed the patients History and Physical, chart, labs and discussed the procedure including the risks, benefits and alternatives for the proposed anesthesia with the patient or authorized representative who has indicated his/her understanding and acceptance.       Plan Discussed with: CRNA  Anesthesia Plan Comments:        Anesthesia Quick Evaluation

## 2020-11-15 NOTE — Addendum Note (Signed)
Addendum  created 11/15/20 1334 by Tressia Miners, CRNA   Flowsheet accepted, Intraprocedure Flowsheets edited

## 2020-11-15 NOTE — Transfer of Care (Signed)
Immediate Anesthesia Transfer of Care Note  Patient: Kayla Hendrix  Procedure(s) Performed: ATRIAL FIBRILLATION ABLATION (N/A )  Patient Location: PACU and Cath Lab  Anesthesia Type:General  Level of Consciousness: awake and alert   Airway & Oxygen Therapy: Patient Spontanous Breathing and Patient connected to face mask oxygen  Post-op Assessment: Report given to RN and Post -op Vital signs reviewed and stable  Post vital signs: Reviewed and stable  Last Vitals:  Vitals Value Taken Time  BP 121/58 11/15/20 1037  Temp    Pulse 81 11/15/20 1038  Resp 18 11/15/20 1038  SpO2 92 % 11/15/20 1038  Vitals shown include unvalidated device data.  Last Pain:  Vitals:   11/15/20 0557  TempSrc: Oral         Complications: No complications documented.

## 2020-11-15 NOTE — Anesthesia Procedure Notes (Signed)
Procedure Name: Intubation Date/Time: 11/15/2020 8:03 AM Performed by: Reece Agar, CRNA Pre-anesthesia Checklist: Patient identified, Emergency Drugs available, Suction available and Patient being monitored Patient Re-evaluated:Patient Re-evaluated prior to induction Oxygen Delivery Method: Circle System Utilized Preoxygenation: Pre-oxygenation with 100% oxygen Induction Type: IV induction Ventilation: Mask ventilation without difficulty Laryngoscope Size: Mac and 3 Grade View: Grade I Tube type: Oral Tube size: 7.0 mm Number of attempts: 1 Airway Equipment and Method: Stylet Placement Confirmation: ETT inserted through vocal cords under direct vision,  positive ETCO2 and breath sounds checked- equal and bilateral Secured at: 22 cm Tube secured with: Tape Dental Injury: Teeth and Oropharynx as per pre-operative assessment  Comments: Pt with poor dentition, remains the same as pre-op

## 2020-11-20 DIAGNOSIS — E1142 Type 2 diabetes mellitus with diabetic polyneuropathy: Secondary | ICD-10-CM | POA: Diagnosis not present

## 2020-11-20 DIAGNOSIS — Z23 Encounter for immunization: Secondary | ICD-10-CM | POA: Diagnosis not present

## 2020-11-20 DIAGNOSIS — I1310 Hypertensive heart and chronic kidney disease without heart failure, with stage 1 through stage 4 chronic kidney disease, or unspecified chronic kidney disease: Secondary | ICD-10-CM | POA: Diagnosis not present

## 2020-11-20 DIAGNOSIS — D649 Anemia, unspecified: Secondary | ICD-10-CM | POA: Diagnosis not present

## 2020-11-20 DIAGNOSIS — E11649 Type 2 diabetes mellitus with hypoglycemia without coma: Secondary | ICD-10-CM | POA: Diagnosis not present

## 2020-11-20 DIAGNOSIS — E785 Hyperlipidemia, unspecified: Secondary | ICD-10-CM | POA: Diagnosis not present

## 2020-11-21 ENCOUNTER — Other Ambulatory Visit: Payer: Self-pay | Admitting: Internal Medicine

## 2020-11-21 DIAGNOSIS — Z1231 Encounter for screening mammogram for malignant neoplasm of breast: Secondary | ICD-10-CM

## 2020-11-22 ENCOUNTER — Telehealth: Payer: Self-pay | Admitting: Internal Medicine

## 2020-11-22 NOTE — Telephone Encounter (Signed)
Pt c/o medication issue:  1. Name of Medication: ELIQUIS 5 MG TABS tablet  2. How are you currently taking this medication (dosage and times per day)? 1 tablet twice a day  3. Are you having a reaction (difficulty breathing--STAT)? no  4. What is your medication issue? Patient states her insurance changed and the medication is going to cost her $500 a month. She would like to know if there is a way to get the medication for cheaper.

## 2020-11-23 NOTE — Telephone Encounter (Signed)
ID [id number] Rx BIN [id number] RX PCN IRX RX GRP EFLUXC  [facility] [phone]   After calling patients insurance, Eliquis is covered as a tier 2 with no PA needed. Cost is 30%. But patient has to use mail order. Which is why cost was $509 at retail. Unfortunately if we used copay card at retail would run out of money very fast. Cannot use copay card at mail order. Patient does not like using mail order She states she may go back to ASA. Advised that ASA does not protect against stroke. She just had an ablation, the first 90 days are the most important. She needs to be on eliquis for the next 3 months. Patient will call insurance, states she might have to do mail order for a few months.  Advised to call us if she needs [place] to send Rx.

## 2020-11-28 ENCOUNTER — Other Ambulatory Visit: Payer: Self-pay

## 2020-11-28 DIAGNOSIS — I480 Paroxysmal atrial fibrillation: Secondary | ICD-10-CM

## 2020-11-28 MED ORDER — APIXABAN 5 MG PO TABS: 5.0000 mg | ORAL_TABLET | Freq: Two times a day (BID) | ORAL | 3 refills | Status: DC

## 2020-11-28 NOTE — Telephone Encounter (Signed)
Prescription refill request for Eliquis received. Indication: a fib Last office visit:  10/08/20 Scr: 1.13 Age: 70 Weight: 105kg

## 2020-11-28 NOTE — Telephone Encounter (Signed)
Pt calling requesting a 90 day supply be sent to her local pharmacy [facility] in [place] [facility] I informed pt that I would be leaving a 10 dollar co-pay card at the front desk for pt to pick up. Pt verbalized understanding. Please address

## 2020-12-14 ENCOUNTER — Other Ambulatory Visit (HOSPITAL_COMMUNITY): Payer: Self-pay | Admitting: Nurse Practitioner

## 2020-12-19 ENCOUNTER — Other Ambulatory Visit: Payer: Self-pay

## 2020-12-19 ENCOUNTER — Ambulatory Visit (HOSPITAL_COMMUNITY)
Admission: RE | Admit: 2020-12-19 | Discharge: 2020-12-19 | Disposition: A | Discharge status: Home / Self Care | Payer: BC Managed Care – PPO | Source: Ambulatory Visit | Attending: Nurse Practitioner | Admitting: Nurse Practitioner

## 2020-12-19 ENCOUNTER — Encounter (HOSPITAL_COMMUNITY): Payer: Self-pay | Admitting: Nurse Practitioner

## 2020-12-19 VITALS — BP 124/60 | HR 111 | Ht 66.0 in | Wt 230.8 lb

## 2020-12-19 DIAGNOSIS — E785 Hyperlipidemia, unspecified: Secondary | ICD-10-CM | POA: Insufficient documentation

## 2020-12-19 DIAGNOSIS — I4819 Other persistent atrial fibrillation: Secondary | ICD-10-CM | POA: Diagnosis not present

## 2020-12-19 DIAGNOSIS — Z87891 Personal history of nicotine dependence: Secondary | ICD-10-CM | POA: Insufficient documentation

## 2020-12-19 DIAGNOSIS — Z79899 Other long term (current) drug therapy: Secondary | ICD-10-CM | POA: Diagnosis not present

## 2020-12-19 DIAGNOSIS — I2510 Atherosclerotic heart disease of native coronary artery without angina pectoris: Secondary | ICD-10-CM | POA: Insufficient documentation

## 2020-12-19 DIAGNOSIS — D6869 Other thrombophilia: Secondary | ICD-10-CM | POA: Diagnosis not present

## 2020-12-19 DIAGNOSIS — E669 Obesity, unspecified: Secondary | ICD-10-CM | POA: Insufficient documentation

## 2020-12-19 DIAGNOSIS — Z6837 Body mass index (BMI) 37.0-37.9, adult: Secondary | ICD-10-CM | POA: Insufficient documentation

## 2020-12-19 DIAGNOSIS — I10 Essential (primary) hypertension: Secondary | ICD-10-CM | POA: Diagnosis not present

## 2020-12-19 DIAGNOSIS — Z7901 Long term (current) use of anticoagulants: Secondary | ICD-10-CM | POA: Insufficient documentation

## 2020-12-19 DIAGNOSIS — G4733 Obstructive sleep apnea (adult) (pediatric): Secondary | ICD-10-CM | POA: Diagnosis not present

## 2020-12-19 LAB — CBC
HCT: 32.2 % — ABNORMAL LOW (ref 36.0–46.0)
Hemoglobin: 9.9 g/dL — ABNORMAL LOW (ref 12.0–15.0)
MCH: 22.4 pg — ABNORMAL LOW (ref 26.0–34.0)
MCHC: 30.7 g/dL (ref 30.0–36.0)
MCV: 72.9 fL — ABNORMAL LOW (ref 80.0–100.0)
Platelets: 263 10*3/uL (ref 150–400)
RBC: 4.42 MIL/uL (ref 3.87–5.11)
RDW: 18.1 % — ABNORMAL HIGH (ref 11.5–15.5)
WBC: 10.1 10*3/uL (ref 4.0–10.5)
nRBC: 0 % (ref 0.0–0.2)

## 2020-12-19 LAB — BASIC METABOLIC PANEL
Anion gap: 13 (ref 5–15)
BUN: 12 mg/dL (ref 8–23)
CO2: 27 mmol/L (ref 22–32)
Calcium: 8.5 mg/dL — ABNORMAL LOW (ref 8.9–10.3)
Chloride: 101 mmol/L (ref 98–111)
Creatinine, Ser: 0.92 mg/dL (ref 0.44–1.00)
GFR, Estimated: >60 mL/min (ref >60)
Glucose, Bld: 99 mg/dL (ref 70–99)
Potassium: 4.3 mmol/L (ref 3.5–5.1)
Sodium: 141 mmol/L (ref 135–145)

## 2020-12-19 NOTE — Progress Notes (Signed)
Primary Care Physician: [name], MD Referring Physician: [name] is a 70 y.o. female with a h/o paroxysmal afib that is in the afib clinic 11/29/19  for persistent afib that had been present since the  previous  Sunday. She was scheduled for a cardioversion in 2019 but converted herself and DCCV was cancelled. Last  cardioversion was 2017. This  is the first afib that she  has had since 2019 that persisted for more than a few minutes.  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 She feels the stress from that may have been  her trigger. No missed doses of eliquis for the last 3 weeks, CHA2DS2VASc score of 4. Has OSA but intolerant to cpap.  F/u in afib clinic, 12/30/19. She had successful cardioversion 12/07/19 and continues in SR. She feels improved.   Asked to be seen today as she felt weak today. EKG shows junctional rhythm at 46 bpm. She does not remember taking any extra medicine by mistake. She  felt well yesterday.   F/u in afib clinic, 04/16/20 as pt went into afib last week. She  had an back injection the end of May and had to be off anticoagulation x 4 days. She restarted eliquis  5/26. She  is rate controlled today but feels fatigued. She has not taken BB on a regular basis since she had a day of bradycardia, she is taking prn.   F/u in afib clinic, 08/09/20. Pt asked to be seen today for ongoing afib x at least  3 weeks. She has afib with v rates in the 130's today. She feels short of breath with exertion and is fatigued. In June she went into afib and was set up for cardioversion but she self converted. She was waiting to see if she could convert on her own.   F/u in afib clinic, 08/20/20, she unfortunately did not shock out with cardioversion. She is here to discuss options to restore SR.  I feel [name] is her best option as she has a first degree AVB with IRBBB at baseline, on the young side for amiodarone. I feel Multaq may  be too expensive with her current drug plan and with her being in persistent  afib x several weeks, may not do the job to get her back in rhythm. Therefore we discussed admission for Tikosyn which she is in agreement. She is currently out of work since the cardioversion. She is very winded at work as she has to walk a long distance. I did give her lasix last week before the cardioversion and her weight is down almost 6 lbs this week.   F/u in afib clinic,09/24/20. Mrs Moncivais  was not able to come into the hospital for [place] admit as she had a very low magnesium on 600 mg magnesium a day. Her qt was borderline and with the low magnesium, it was felt that tikosyn may not work for pt per Dr. Rayann Heman. It was decided to use amiodarone as an bridge to ablation. The pt on f/u stated that she  could not tolerate the 200 mg bid of amiodarone  because of anorexia and nausea. I lowered the dose of amiodarone to  200 mg qd last week and the pt today states that the nausea has improved but still present and she also has some diarrhea. She  is c/o of more shortness of breath as well with  the amiodarone. She will stop amiodarone, increase metoprolol tartrate to 50 mg bid for better rate control and I will update her echo and have her f/u with Dr. Rayann Heman to discuss ablation. She  has been out of work since the  anticipated hospitalization for tikosyn and then  the intolerance of amiodarone. She states that it is difficult to walk into to work as she  has to walk a longer distance and up stairs to her desk because of covid screening. I asked if she could get an exception to come into the door closer to her work and she thinks that the employer would not allow this.   F/u in afib clinic, 12/19/20. Since I saw pt last she was referred  to Dr. Rayann Heman for an ablation which was performed 11/16/19. EKG shows today afib with RVR but pt states she is rate controlled at home. She feels she was in rhythm for at least one week and felt so  much better. Has been in afib x 21/2 weeks. No swallowing or groin issues. Continues  on eliquis 5 mg bid for CHA2DS2VASc of 4.    Today, she denies symptoms of palpitations, chest pain, shortness of breath, orthopnea, PND, lower extremity edema, dizziness, presyncope, syncope, or neurologic sequela. The patient is tolerating medications without difficulties and is otherwise without complaint today.   Past Medical History:  Diagnosis Date  . Allergy   . Anemia   . Anxiety   . Atrial fibrillation (HCC)    paroxysmal only asa 81 mg, no other blood thinner, the xarelto she was on gave her HAs  . Barrett's esophagus 2018  . CAD (coronary artery disease)    cath 2006- nonobstructive CAD  . Cataract   . Depression   . Diabetes mellitus without complication ([place])   . Gastric polyp   . GERD (gastroesophageal reflux disease)   . Glucose intolerance (impaired glucose tolerance)   . Hiatal hernia   . History of transesophageal echocardiography (TEE) for monitoring    TEE 3/17:  EF 55%, no RWMA, mild plaque in descending aorta, mild MR, mod BAE, normal RVF  . HL (hearing loss)   . HTN (hypertension)   . Hyperlipidemia   . Hyperplastic colon polyp   . Internal hemorrhoids   . Intestinal metaplasia of gastric mucosa   . Obesity   . OSA (obstructive sleep apnea)    noncompliant with CPAP  . Tubular adenoma of colon    Past Surgical History:  Procedure Laterality Date  . ATRIAL FIBRILLATION ABLATION N/A 11/15/2020   Procedure: ATRIAL FIBRILLATION ABLATION;  Surgeon: Thompson Grayer, MD;  Location: [facility];  Service: Cardiovascular;  Laterality: N/A;  . BIOPSY  10/17/2019   Procedure: BIOPSY;  Surgeon: Jerene Bears, MD;  Location: [facility];  Service: Gastroenterology;;  . CARDIOVERSION N/A 01/23/2016   Procedure: CARDIOVERSION;  Surgeon: Larey Dresser, MD;  Location: [facility] ENDOSCOPY;  Service: Cardiovascular;  Laterality: N/A;  . CARDIOVERSION N/A 12/07/2019   Procedure:  CARDIOVERSION;  Surgeon: Pixie Casino, MD;  Location: [facility] ENDOSCOPY;  Service: Cardiovascular;  Laterality: N/A;  . CARDIOVERSION N/A 08/15/2020   Procedure: CARDIOVERSION;  Surgeon: Werner Lean, MD;  Location: [facility] ENDOSCOPY;  Service: Cardiovascular;  Laterality: N/A;  . CESAREAN SECTION    . ESOPHAGOGASTRODUODENOSCOPY (EGD) WITH PROPOFOL N/A 10/17/2019   Procedure: ESOPHAGOGASTRODUODENOSCOPY (EGD) WITH PROPOFOL;  Surgeon: Jerene Bears, MD;  Location: [facility] ENDOSCOPY;  Service: Gastroenterology;  Laterality: N/A;  . GANGLION CYST EXCISION  Left 1970s  . HEMOSTASIS CLIP PLACEMENT  10/17/2019   Procedure: HEMOSTASIS CLIP PLACEMENT;  Surgeon: Jerene Bears, MD;  Location: [facility] ENDOSCOPY;  Service: Gastroenterology;;  . POLYPECTOMY  10/17/2019   Procedure: POLYPECTOMY;  Surgeon: Jerene Bears, MD;  Location: [facility] ENDOSCOPY;  Service: Gastroenterology;;  . TEE WITHOUT CARDIOVERSION N/A 01/23/2016   Procedure: TRANSESOPHAGEAL ECHOCARDIOGRAM (TEE);  Surgeon: Larey Dresser, MD;  Location: [place] [facility];  Service: Cardiovascular;  Laterality: N/A;  . TOTAL ABDOMINAL HYSTERECTOMY    . WRIST FRACTURE SURGERY  2010   wrist fracture repair with metal rod    Current Outpatient Medications  Medication Sig Dispense Refill  . apixaban (ELIQUIS) 5 MG TABS tablet Take 1 tablet (5 mg total) by mouth 2 (two) times daily. 90 tablet 3  . atorvastatin (LIPITOR) 40 MG tablet Take 40 mg by mouth at bedtime.    . clonazePAM (KLONOPIN) 0.5 MG tablet Take 0.5 mg by mouth daily.    Marland Kitchen diltiazem (TIAZAC) 360 MG 24 hr capsule Take 360 mg by mouth daily.    . Ferrous Sulfate (IRON) 325 (65 Fe) MG TABS Take 325 mg by mouth 2 (two) times a week.    . furosemide (LASIX) 20 MG tablet Take 1.5 tablets by mouth daily for fluid (Patient taking differently: Take 30 mg by mouth daily.) 45 tablet 6  . gabapentin (NEURONTIN) 300 MG capsule Take 300 mg by mouth 2 (two) times daily.    Marland Kitchen glimepiride (AMARYL) 4 MG tablet Take 4 mg  by mouth 2 (two) times daily.  99  . glucose blood (ACCU-CHEK AVIVA PLUS) test strip CHECK BLOOD SUGAR TWICE DAILY AS DIRECTED    . Magnesium Oxide 200 MG TABS Take 1 tablet (200 mg total) by mouth daily. (Patient not taking: Reported on 11/13/2020)    . metFORMIN (GLUCOPHAGE) 500 MG tablet Take 1,000 mg by mouth in the morning and at bedtime.    . metoprolol tartrate (LOPRESSOR) 25 MG tablet Take 1 tablet (25 mg total) by mouth in the morning and at bedtime. (Patient taking differently: Take 37.5 mg by mouth in the morning and at bedtime.) 60 tablet 6  . omeprazole (PRILOSEC) 20 MG capsule Take 20 mg by mouth daily.    . potassium chloride (KLOR-CON) 10 MEQ tablet TAKE 1 TABLET BY MOUTH EVERY DAY 30 tablet 3  . sitaGLIPtin (JANUVIA) 100 MG tablet Take 100 mg by mouth daily.     No current facility-administered medications for this encounter.    Allergies  Allergen Reactions  . Amiodarone Diarrhea and Nausea Only  . Hydrocodone Itching    Social History   Socioeconomic History  . Marital status: Single    Spouse name: Not on file  . Number of children: 1  . Years of education: Not on file  . Highest education level: Not on file  Occupational History  . Not on file  Tobacco Use  . Smoking status: Former Smoker    Quit date: 2010    Years since quitting: 12.1  . Smokeless tobacco: Never Used  . Tobacco comment: 15-pack-year smoker  Vaping Use  . Vaping Use: Never used  Substance and Sexual Activity  . Alcohol use: No    Comment: rarely  . Drug use: No  . Sexual activity: Not on file  Other Topics Concern  . Not on file  Social History Narrative   Pt. Lives in [place] with her son. 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    Social Determinants of Health   Financial Resource Strain: Not on file  Food Insecurity: Not on file  Transportation Needs: Not on file  Physical Activity: Not on file   Stress: Not on file  Social Connections: Not on file  Intimate Partner Violence: Not on file    Family History  Problem Relation Age of Onset  . Cancer Father   . Stomach cancer Father   . Diabetes Other        noninsulin dependant DM  . Hypertension Other   . Stomach cancer Maternal Grandmother   . Colon cancer Neg Hx   . Colon polyps Neg Hx   . Esophageal cancer Neg Hx   . Rectal cancer Neg Hx     ROS- All systems are reviewed and negative except as per the HPI above  Physical Exam: Vitals:   12/19/20 1533  Weight: 104.7 kg  Height: 5\' 6"  (1.676 m)   Wt Readings from Last 3 Encounters:  12/19/20 104.7 kg  11/15/20 105.2 kg  10/08/20 105.2 kg    Labs: Lab Results  Component Value Date   NA 139 11/08/2020   K 3.5 11/08/2020   CL 100 11/08/2020   CO2 25 11/08/2020   GLUCOSE 105 (H) 11/08/2020   BUN 17 11/08/2020   CREATININE 1.13 (H) 11/08/2020   CALCIUM 7.9 (L) 11/08/2020   MG 1.5 (L) 09/04/2020   Lab Results  Component Value Date   INR 1.0 04/08/2012   Lab Results  Component Value Date   CHOL 159 04/10/2012   HDL 34 (L) 04/10/2012   LDLCALC 79 04/10/2012   TRIG 230 (H) 04/10/2012     GEN- The patient is well appearing, alert and oriented x 3 today.   Head- normocephalic, atraumatic Eyes-  Sclera clear, conjunctiva pink Ears- hearing intact Oropharynx- clear Neck- supple, no JVP Lymph- no cervical lymphadenopathy Lungs- Clear to ausculation bilaterally, normal work of breathing Heart-irregular rate and rhythm, no murmurs, rubs or gallops, PMI not laterally displaced GI- soft, NT, ND, + BS Extremities- no clubbing, cyanosis, or edema MS- no significant deformity or atrophy Skin- no rash or lesion Psych- euthymic mood, full affect Neuro- strength and sensation are intact  EKG- afib at 111 bpm, qrs int 90 ms, qtc 505 ms   Cath 2006- CONCLUSION:  1.  Well-preserved overall left ventricular function.  2.  Calcification of mid left anterior  descending artery without critical    stenosis.     Assessment and Plan: 1. Persistent  afib  She failed cardioversion 08/15/20, her afib burden has increased over the last year She was not an candidate for Tikosyn due to hypomagnesemia and borderline qt She has failed amiodarone for GI intolerance, nausea and diarrhea  She is now one month s/p ablation 11/15/20 Only maintained SR x one week Will plan for cardioversion Risk vrs benefit discussed with pt and she is willing to proceed  States she is rate controlled at home contiue BB at current dose  Cbc/bmet/covid testing today    2. HTN Stable   3. CHA2DS2VASc score of 4 Continue  eliquis 5 mg bid  States no known missed doses x 3 weeks    F/u here in one week    Butch Penny C. Carroll, [facility] [address] [place],  [id number] [phone]

## 2020-12-19 NOTE — Patient Instructions (Signed)
Cardioversion scheduled for Thursday, February 17th  - Arrive at the [facility] and go to admitting at 830AM  - Do not eat or drink anything after midnight the night prior to your procedure.  - Take all your morning medication (except diabetic medications) with a sip of water prior to arrival.  - You will not be able to drive home after your procedure.  - Do NOT miss any doses of your blood thinner - if you should miss a dose please notify our office immediately.  - If you feel as if you go back into normal rhythm prior to scheduled cardioversion, please notify our office immediately. If your procedure is canceled in the cardioversion suite you will be charged a cancellation fee.

## 2020-12-19 NOTE — H&P (View-Only) (Signed)
Primary Care Physician: [name], MD Referring Physician: [name] is a 70 y.o. female with a h/o paroxysmal afib that is in the afib clinic 11/29/19  for persistent afib that had been present since the  previous  Sunday. She was scheduled for a cardioversion in 2019 but converted herself and DCCV was cancelled. Last  cardioversion was 2017. This  is the first afib that she  has had since 2019 that persisted for more than a few minutes.  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 She feels the stress from that may have been  her trigger. No missed doses of eliquis for the last 3 weeks, CHA2DS2VASc score of 4. Has OSA but intolerant to cpap.  F/u in afib clinic, 12/30/19. She had successful cardioversion 12/07/19 and continues in SR. She feels improved.   Asked to be seen today as she felt weak today. EKG shows junctional rhythm at 46 bpm. She does not remember taking any extra medicine by mistake. She  felt well yesterday.   F/u in afib clinic, 04/16/20 as pt went into afib last week. She  had an back injection the end of May and had to be off anticoagulation x 4 days. She restarted eliquis  5/26. She  is rate controlled today but feels fatigued. She has not taken BB on a regular basis since she had a day of bradycardia, she is taking prn.   F/u in afib clinic, 08/09/20. Pt asked to be seen today for ongoing afib x at least  3 weeks. She has afib with v rates in the 130's today. She feels short of breath with exertion and is fatigued. In June she went into afib and was set up for cardioversion but she self converted. She was waiting to see if she could convert on her own.   F/u in afib clinic, 08/20/20, she unfortunately did not shock out with cardioversion. She is here to discuss options to restore SR.  I feel [name] is her best option as she has a first degree AVB with IRBBB at baseline, on the young side for amiodarone. I feel Multaq may  be too expensive with her current drug plan and with her being in persistent  afib x several weeks, may not do the job to get her back in rhythm. Therefore we discussed admission for Tikosyn which she is in agreement. She is currently out of work since the cardioversion. She is very winded at work as she has to walk a long distance. I did give her lasix last week before the cardioversion and her weight is down almost 6 lbs this week.   F/u in afib clinic,09/24/20. Mrs Hendler  was not able to come into the hospital for [facility] admit as she had a very low magnesium on 600 mg magnesium a day. Her qt was borderline and with the low magnesium, it was felt that tikosyn may not work for pt per Dr. Rayann Heman. It was decided to use amiodarone as an bridge to ablation. The pt on f/u stated that she  could not tolerate the 200 mg bid of amiodarone  because of anorexia and nausea. I lowered the dose of amiodarone to  200 mg qd last week and the pt today states that the nausea has improved but still present and she also has some diarrhea. She  is c/o of more shortness of breath as well with  the amiodarone. She will stop amiodarone, increase metoprolol tartrate to 50 mg bid for better rate control and I will update her echo and have her f/u with Dr. Rayann Heman to discuss ablation. She  has been out of work since the  anticipated hospitalization for tikosyn and then  the intolerance of amiodarone. She states that it is difficult to walk into to work as she  has to walk a longer distance and up stairs to her desk because of covid screening. I asked if she could get an exception to come into the door closer to her work and she thinks that the employer would not allow this.   F/u in afib clinic, 12/19/20. Since I saw pt last she was referred  to Dr. Rayann Heman for an ablation which was performed 11/16/19. EKG shows today afib with RVR but pt states she is rate controlled at home. She feels she was in rhythm for at least one week and felt so  much better. Has been in afib x 21/2 weeks. No swallowing or groin issues. Continues  on eliquis 5 mg bid for CHA2DS2VASc of 4.    Today, she denies symptoms of palpitations, chest pain, shortness of breath, orthopnea, PND, lower extremity edema, dizziness, presyncope, syncope, or neurologic sequela. The patient is tolerating medications without difficulties and is otherwise without complaint today.   Past Medical History:  Diagnosis Date  . Allergy   . Anemia   . Anxiety   . Atrial fibrillation (HCC)    paroxysmal only asa 81 mg, no other blood thinner, the xarelto she was on gave her HAs  . Barrett's esophagus 2018  . CAD (coronary artery disease)    cath 2006- nonobstructive CAD  . Cataract   . Depression   . Diabetes mellitus without complication ([place])   . Gastric polyp   . GERD (gastroesophageal reflux disease)   . Glucose intolerance (impaired glucose tolerance)   . Hiatal hernia   . History of transesophageal echocardiography (TEE) for monitoring    TEE 3/17:  EF 55%, no RWMA, mild plaque in descending aorta, mild MR, mod BAE, normal RVF  . HL (hearing loss)   . HTN (hypertension)   . Hyperlipidemia   . Hyperplastic colon polyp   . Internal hemorrhoids   . Intestinal metaplasia of gastric mucosa   . Obesity   . OSA (obstructive sleep apnea)    noncompliant with CPAP  . Tubular adenoma of colon    Past Surgical History:  Procedure Laterality Date  . ATRIAL FIBRILLATION ABLATION N/A 11/15/2020   Procedure: ATRIAL FIBRILLATION ABLATION;  Surgeon: Thompson Grayer, MD;  Location: [facility];  Service: Cardiovascular;  Laterality: N/A;  . BIOPSY  10/17/2019   Procedure: BIOPSY;  Surgeon: Jerene Bears, MD;  Location: [facility];  Service: Gastroenterology;;  . CARDIOVERSION N/A 01/23/2016   Procedure: CARDIOVERSION;  Surgeon: Larey Dresser, MD;  Location: [facility] ENDOSCOPY;  Service: Cardiovascular;  Laterality: N/A;  . CARDIOVERSION N/A 12/07/2019   Procedure:  CARDIOVERSION;  Surgeon: Pixie Casino, MD;  Location[facility] ENDOSCOPY;  Service: Cardiovascular;  Laterality: N/A;  . CARDIOVERSION N/A 08/15/2020   Procedure: CARDIOVERSION;  Surgeon: Werner Lean, MD;  Location: [facility] ENDOSCOPY;  Service: Cardiovascular;  Laterality: N/A;  . CESAREAN SECTION    . ESOPHAGOGASTRODUODENOSCOPY (EGD) WITH PROPOFOL N/A 10/17/2019   Procedure: ESOPHAGOGASTRODUODENOSCOPY (EGD) WITH PROPOFOL;  Surgeon: Jerene Bears, MD;  Location: [facility] ENDOSCOPY;  Service: Gastroenterology;  Laterality: N/A;  . GANGLION CYST EXCISION  Left 1970s  . HEMOSTASIS CLIP PLACEMENT  10/17/2019   Procedure: HEMOSTASIS CLIP PLACEMENT;  Surgeon: Jerene Bears, MD;  Location: [facility] ENDOSCOPY;  Service: Gastroenterology;;  . POLYPECTOMY  10/17/2019   Procedure: POLYPECTOMY;  Surgeon: Jerene Bears, MD;  Location: [facility] ENDOSCOPY;  Service: Gastroenterology;;  . TEE WITHOUT CARDIOVERSION N/A 01/23/2016   Procedure: TRANSESOPHAGEAL ECHOCARDIOGRAM (TEE);  Surgeon: Larey Dresser, MD;  Location: [facility];  Service: Cardiovascular;  Laterality: N/A;  . TOTAL ABDOMINAL HYSTERECTOMY    . WRIST FRACTURE SURGERY  2010   wrist fracture repair with metal rod    Current Outpatient Medications  Medication Sig Dispense Refill  . apixaban (ELIQUIS) 5 MG TABS tablet Take 1 tablet (5 mg total) by mouth 2 (two) times daily. 90 tablet 3  . atorvastatin (LIPITOR) 40 MG tablet Take 40 mg by mouth at bedtime.    . clonazePAM (KLONOPIN) 0.5 MG tablet Take 0.5 mg by mouth daily.    Marland Kitchen diltiazem (TIAZAC) 360 MG 24 hr capsule Take 360 mg by mouth daily.    . Ferrous Sulfate (IRON) 325 (65 Fe) MG TABS Take 325 mg by mouth 2 (two) times a week.    . furosemide (LASIX) 20 MG tablet Take 1.5 tablets by mouth daily for fluid (Patient taking differently: Take 30 mg by mouth daily.) 45 tablet 6  . gabapentin (NEURONTIN) 300 MG capsule Take 300 mg by mouth 2 (two) times daily.    Marland Kitchen glimepiride (AMARYL) 4 MG tablet Take 4 mg  by mouth 2 (two) times daily.  99  . glucose blood (ACCU-CHEK AVIVA PLUS) test strip CHECK BLOOD SUGAR TWICE DAILY AS DIRECTED    . Magnesium Oxide 200 MG TABS Take 1 tablet (200 mg total) by mouth daily. (Patient not taking: Reported on 11/13/2020)    . metFORMIN (GLUCOPHAGE) 500 MG tablet Take 1,000 mg by mouth in the morning and at bedtime.    . metoprolol tartrate (LOPRESSOR) 25 MG tablet Take 1 tablet (25 mg total) by mouth in the morning and at bedtime. (Patient taking differently: Take 37.5 mg by mouth in the morning and at bedtime.) 60 tablet 6  . omeprazole (PRILOSEC) 20 MG capsule Take 20 mg by mouth daily.    . potassium chloride (KLOR-CON) 10 MEQ tablet TAKE 1 TABLET BY MOUTH EVERY DAY 30 tablet 3  . sitaGLIPtin (JANUVIA) 100 MG tablet Take 100 mg by mouth daily.     No current facility-administered medications for this encounter.    Allergies  Allergen Reactions  . Amiodarone Diarrhea and Nausea Only  . Hydrocodone Itching    Social History   Socioeconomic History  . Marital status: Single    Spouse name: Not on file  . Number of children: 1  . Years of education: Not on file  . Highest education level: Not on file  Occupational History  . Not on file  Tobacco Use  . Smoking status: Former Smoker    Quit date: 2010    Years since quitting: 12.1  . Smokeless tobacco: Never Used  . Tobacco comment: 15-pack-year smoker  Vaping Use  . Vaping Use: Never used  Substance and Sexual Activity  . Alcohol use: No    Comment: rarely  . Drug use: No  . Sexual activity: Not on file  Other Topics Concern  . Not on file  Social History Narrative   Pt. Lives in [place] with her son. 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    Social Determinants of Health   Financial Resource Strain: Not on file  Food Insecurity: Not on file  Transportation Needs: Not on file  Physical Activity: Not on file   Stress: Not on file  Social Connections: Not on file  Intimate Partner Violence: Not on file    Family History  Problem Relation Age of Onset  . Cancer Father   . Stomach cancer Father   . Diabetes Other        noninsulin dependant DM  . Hypertension Other   . Stomach cancer Maternal Grandmother   . Colon cancer Neg Hx   . Colon polyps Neg Hx   . Esophageal cancer Neg Hx   . Rectal cancer Neg Hx     ROS- All systems are reviewed and negative except as per the HPI above  Physical Exam: Vitals:   12/19/20 1533  Weight: 104.7 kg  Height: 5\' 6"  (1.676 m)   Wt Readings from Last 3 Encounters:  12/19/20 104.7 kg  11/15/20 105.2 kg  10/08/20 105.2 kg    Labs: Lab Results  Component Value Date   NA 139 11/08/2020   K 3.5 11/08/2020   CL 100 11/08/2020   CO2 25 11/08/2020   GLUCOSE 105 (H) 11/08/2020   BUN 17 11/08/2020   CREATININE 1.13 (H) 11/08/2020   CALCIUM 7.9 (L) 11/08/2020   MG 1.5 (L) 09/04/2020   Lab Results  Component Value Date   INR 1.0 04/08/2012   Lab Results  Component Value Date   CHOL 159 04/10/2012   HDL 34 (L) 04/10/2012   LDLCALC 79 04/10/2012   TRIG 230 (H) 04/10/2012     GEN- The patient is well appearing, alert and oriented x 3 today.   Head- normocephalic, atraumatic Eyes-  Sclera clear, conjunctiva pink Ears- hearing intact Oropharynx- clear Neck- supple, no JVP Lymph- no cervical lymphadenopathy Lungs- Clear to ausculation bilaterally, normal work of breathing Heart-irregular rate and rhythm, no murmurs, rubs or gallops, PMI not laterally displaced GI- soft, NT, ND, + BS Extremities- no clubbing, cyanosis, or edema MS- no significant deformity or atrophy Skin- no rash or lesion Psych- euthymic mood, full affect Neuro- strength and sensation are intact  EKG- afib at 111 bpm, qrs int 90 ms, qtc 505 ms   Cath 2006- CONCLUSION:  1.  Well-preserved overall left ventricular function.  2.  Calcification of mid left anterior  descending artery without critical    stenosis.     Assessment and Plan: 1. Persistent  afib  She failed cardioversion 08/15/20, her afib burden has increased over the last year She was not an candidate for Tikosyn due to hypomagnesemia and borderline qt She has failed amiodarone for GI intolerance, nausea and diarrhea  She is now one month s/p ablation 11/15/20 Only maintained SR x one week Will plan for cardioversion Risk vrs benefit discussed with pt and she is willing to proceed  States she is rate controlled at home contiue BB at current dose  Cbc/bmet/covid testing today    2. HTN Stable   3. CHA2DS2VASc score of 4 Continue  eliquis 5 mg bid  States no known missed doses x 3 weeks    F/u here in one week    Butch Penny C. Anaya Bovee, [facility] [address] [place], [place] [address] [phone]

## 2020-12-25 ENCOUNTER — Other Ambulatory Visit (HOSPITAL_COMMUNITY)
Admission: RE | Admit: 2020-12-25 | Discharge: 2020-12-25 | Disposition: A | Discharge status: Home / Self Care | Payer: BC Managed Care – PPO | Source: Ambulatory Visit | Attending: Cardiology | Admitting: Cardiology

## 2020-12-25 DIAGNOSIS — Z87891 Personal history of nicotine dependence: Secondary | ICD-10-CM | POA: Diagnosis not present

## 2020-12-25 DIAGNOSIS — I4819 Other persistent atrial fibrillation: Secondary | ICD-10-CM | POA: Diagnosis not present

## 2020-12-25 DIAGNOSIS — Z7984 Long term (current) use of oral hypoglycemic drugs: Secondary | ICD-10-CM | POA: Diagnosis not present

## 2020-12-25 DIAGNOSIS — Z20822 Contact with and (suspected) exposure to covid-19: Secondary | ICD-10-CM | POA: Insufficient documentation

## 2020-12-25 DIAGNOSIS — I10 Essential (primary) hypertension: Secondary | ICD-10-CM | POA: Diagnosis not present

## 2020-12-25 DIAGNOSIS — Z01812 Encounter for preprocedural laboratory examination: Secondary | ICD-10-CM | POA: Insufficient documentation

## 2020-12-25 DIAGNOSIS — Z7901 Long term (current) use of anticoagulants: Secondary | ICD-10-CM | POA: Diagnosis not present

## 2020-12-25 DIAGNOSIS — Z79899 Other long term (current) drug therapy: Secondary | ICD-10-CM | POA: Diagnosis not present

## 2020-12-25 LAB — SARS CORONAVIRUS 2 (TAT 6-24 HRS): SARS Coronavirus 2: NEGATIVE

## 2020-12-27 ENCOUNTER — Ambulatory Visit (HOSPITAL_COMMUNITY): Payer: BC Managed Care – PPO | Admitting: Anesthesiology

## 2020-12-27 ENCOUNTER — Other Ambulatory Visit: Payer: Self-pay

## 2020-12-27 ENCOUNTER — Ambulatory Visit (HOSPITAL_COMMUNITY)
Admission: RE | Admit: 2020-12-27 | Discharge: 2020-12-27 | Disposition: A | Discharge status: Home / Self Care | Payer: BC Managed Care – PPO | Attending: Cardiology | Admitting: Cardiology

## 2020-12-27 ENCOUNTER — Encounter (HOSPITAL_COMMUNITY)
Admission: RE | Disposition: A | Discharge status: Home / Self Care | Payer: Self-pay | Source: Home / Self Care | Attending: Cardiology

## 2020-12-27 ENCOUNTER — Encounter (HOSPITAL_COMMUNITY): Payer: Self-pay | Admitting: Cardiology

## 2020-12-27 DIAGNOSIS — Z7901 Long term (current) use of anticoagulants: Secondary | ICD-10-CM | POA: Insufficient documentation

## 2020-12-27 DIAGNOSIS — I4819 Other persistent atrial fibrillation: Secondary | ICD-10-CM

## 2020-12-27 DIAGNOSIS — Z7984 Long term (current) use of oral hypoglycemic drugs: Secondary | ICD-10-CM | POA: Insufficient documentation

## 2020-12-27 DIAGNOSIS — Z20822 Contact with and (suspected) exposure to covid-19: Secondary | ICD-10-CM | POA: Diagnosis not present

## 2020-12-27 DIAGNOSIS — I480 Paroxysmal atrial fibrillation: Secondary | ICD-10-CM | POA: Diagnosis not present

## 2020-12-27 DIAGNOSIS — Z79899 Other long term (current) drug therapy: Secondary | ICD-10-CM | POA: Diagnosis not present

## 2020-12-27 DIAGNOSIS — Z87891 Personal history of nicotine dependence: Secondary | ICD-10-CM | POA: Insufficient documentation

## 2020-12-27 DIAGNOSIS — I10 Essential (primary) hypertension: Secondary | ICD-10-CM | POA: Diagnosis not present

## 2020-12-27 DIAGNOSIS — E7800 Pure hypercholesterolemia, unspecified: Secondary | ICD-10-CM | POA: Diagnosis not present

## 2020-12-27 DIAGNOSIS — G4733 Obstructive sleep apnea (adult) (pediatric): Secondary | ICD-10-CM | POA: Diagnosis not present

## 2020-12-27 HISTORY — PX: CARDIOVERSION: SHX1299

## 2020-12-27 SURGERY — CARDIOVERSION
Anesthesia: General

## 2020-12-27 MED ORDER — SODIUM CHLORIDE 0.9 % IV SOLN: INTRAVENOUS | Status: DC | PRN

## 2020-12-27 MED ORDER — LIDOCAINE 2% (20 MG/ML) 5 ML SYRINGE
INTRAMUSCULAR | Status: DC | PRN
  Administered 2020-12-27: 100 mg via INTRAVENOUS

## 2020-12-27 MED ORDER — PROPOFOL 10 MG/ML IV BOLUS
INTRAVENOUS | Status: DC | PRN
  Administered 2020-12-27: 80 mg via INTRAVENOUS

## 2020-12-27 NOTE — Transfer of Care (Signed)
Immediate Anesthesia Transfer of Care Note  Patient: Kayla Hendrix  Procedure(s) Performed: CARDIOVERSION (N/A )  Patient Location: Endoscopy Unit  Anesthesia Type:General  Level of Consciousness: drowsy  Airway & Oxygen Therapy: Patient Spontanous Breathing  Post-op Assessment: Report given to RN and Post -op Vital signs reviewed and stable  Post vital signs: Reviewed and stable  Last Vitals:  Vitals Value Taken Time  BP 127/48 (74)   Temp    Pulse 68   Resp 16   SpO2 95     Last Pain:  Vitals:   12/27/20 0849  TempSrc: Oral  PainSc: 0-No pain         Complications: No complications documented.

## 2020-12-27 NOTE — Interval H&P Note (Signed)
History and Physical Interval Note:  12/27/2020 9:13 AM  Kayla Hendrix  has presented today for surgery, with the diagnosis of AFIB.  The various methods of treatment have been discussed with the patient and family. After consideration of risks, benefits and other options for treatment, the patient has consented to  Procedure(s): CARDIOVERSION (N/A) as a surgical intervention.  The patient's history has been reviewed, patient examined, no change in status, stable for surgery.  I have reviewed the patient's chart and labs.  Questions were answered to the patient's satisfaction.     [employer]

## 2020-12-27 NOTE — Progress Notes (Signed)
Pt blood sugar was 87

## 2020-12-27 NOTE — CV Procedure (Signed)
    Electrical Cardioversion Procedure Note Kayla Hendrix [id number] November 22, 1950  Procedure: Electrical Cardioversion Indications:  Atrial Fibrillation  Time Out: Verified patient identification, verified procedure,medications/allergies/relevent history reviewed, required imaging and test results available.  Performed  Procedure Details  The patient was NPO after midnight. Anesthesia was administered at the beside  by Dr.Singer with 80mg  of propofol.  Cardioversion was performed with synchronized biphasic defibrillation via AP pads with 200 joules.  2 attempt(s) were performed, the second with compression on chest wall.  The patient converted to normal sinus rhythm. The patient tolerated the procedure well   IMPRESSION:  Successful cardioversion of atrial fibrillation. Recent AFIB ablation - Dr. Shonna Chock [facility] 12/27/2020, 9:29 AM

## 2020-12-27 NOTE — Anesthesia Postprocedure Evaluation (Signed)
Anesthesia Post Note  Patient: Kayla Hendrix  Procedure(s) Performed: CARDIOVERSION (N/A )     Patient location during evaluation: PACU Anesthesia Type: General Level of consciousness: sedated Pain management: pain level controlled Vital Signs Assessment: post-procedure vital signs reviewed and stable Respiratory status: spontaneous breathing and respiratory function stable Cardiovascular status: stable Postop Assessment: no apparent nausea or vomiting Anesthetic complications: no   No complications documented.  Last Vitals:  Vitals:   12/27/20 0934 12/27/20 0940  BP: (!) 124/46 (!) 121/48  Pulse: 68 69  Resp: 16 17  Temp: 37.1 C   SpO2: 93% 93%    Last Pain:  Vitals:   12/27/20 0940  TempSrc:   PainSc: Asleep                 Refugia Laneve DANIEL

## 2020-12-27 NOTE — Anesthesia Preprocedure Evaluation (Addendum)
Anesthesia Evaluation  Patient identified by MRN, date of birth, ID band Patient awake    Reviewed: Allergy & Precautions, NPO status , Patient's Chart, lab work & pertinent test results  History of Anesthesia Complications Negative for: history of anesthetic complications  Airway Mallampati: II  TM Distance: >3 FB   Mouth opening: Pediatric Airway  Dental  (+) Poor Dentition, Dental Advisory Given,    Pulmonary sleep apnea , former smoker,    Pulmonary exam normal        Cardiovascular hypertension, + CAD  + dysrhythmias Atrial Fibrillation  Rhythm:Irregular Rate:Tachycardia     Neuro/Psych PSYCHIATRIC DISORDERS Anxiety Depression negative neurological ROS     GI/Hepatic Neg liver ROS, hiatal hernia, GERD  ,  Endo/Other  diabetes  Renal/GU Renal InsufficiencyRenal disease     Musculoskeletal negative musculoskeletal ROS (+)   Abdominal Normal abdominal exam  (+)   Peds  Hematology   Anesthesia Other Findings   Reproductive/Obstetrics                            Anesthesia Physical  Anesthesia Plan  ASA: III  Anesthesia Plan: General   Post-op Pain Management:    Induction: Intravenous  PONV Risk Score and Plan: 4 or greater and Ondansetron and TIVA  Airway Management Planned: Mask  Additional Equipment: None  Intra-op Plan:   Post-operative Plan:   Informed Consent: I have reviewed the patients History and Physical, chart, labs and discussed the procedure including the risks, benefits and alternatives for the proposed anesthesia with the patient or authorized representative who has indicated his/her understanding and acceptance.     Dental advisory given  Plan Discussed with: Anesthesiologist and CRNA  Anesthesia Plan Comments:        Anesthesia Quick Evaluation

## 2020-12-28 LAB — GLUCOSE, CAPILLARY: Glucose-Capillary: 87 mg/dL (ref 70–99)

## 2020-12-30 ENCOUNTER — Encounter (HOSPITAL_COMMUNITY): Payer: Self-pay | Admitting: Cardiology

## 2021-01-03 ENCOUNTER — Other Ambulatory Visit: Payer: Self-pay

## 2021-01-03 ENCOUNTER — Ambulatory Visit (HOSPITAL_COMMUNITY)
Admission: RE | Admit: 2021-01-03 | Discharge: 2021-01-03 | Disposition: A | Discharge status: Home / Self Care | Payer: BC Managed Care – PPO | Source: Ambulatory Visit | Attending: Nurse Practitioner | Admitting: Nurse Practitioner

## 2021-01-03 ENCOUNTER — Encounter (HOSPITAL_COMMUNITY): Payer: Self-pay | Admitting: Nurse Practitioner

## 2021-01-03 VITALS — BP 138/60 | HR 57 | Ht 66.0 in | Wt 232.6 lb

## 2021-01-03 DIAGNOSIS — I4819 Other persistent atrial fibrillation: Secondary | ICD-10-CM | POA: Diagnosis not present

## 2021-01-03 DIAGNOSIS — G4733 Obstructive sleep apnea (adult) (pediatric): Secondary | ICD-10-CM | POA: Insufficient documentation

## 2021-01-03 DIAGNOSIS — Z7901 Long term (current) use of anticoagulants: Secondary | ICD-10-CM | POA: Insufficient documentation

## 2021-01-03 DIAGNOSIS — Z8249 Family history of ischemic heart disease and other diseases of the circulatory system: Secondary | ICD-10-CM | POA: Insufficient documentation

## 2021-01-03 DIAGNOSIS — D6869 Other thrombophilia: Secondary | ICD-10-CM

## 2021-01-03 DIAGNOSIS — Z79899 Other long term (current) drug therapy: Secondary | ICD-10-CM | POA: Insufficient documentation

## 2021-01-03 DIAGNOSIS — I2510 Atherosclerotic heart disease of native coronary artery without angina pectoris: Secondary | ICD-10-CM | POA: Diagnosis not present

## 2021-01-03 DIAGNOSIS — D649 Anemia, unspecified: Secondary | ICD-10-CM | POA: Diagnosis not present

## 2021-01-03 DIAGNOSIS — Z87891 Personal history of nicotine dependence: Secondary | ICD-10-CM | POA: Insufficient documentation

## 2021-01-03 DIAGNOSIS — I10 Essential (primary) hypertension: Secondary | ICD-10-CM | POA: Diagnosis not present

## 2021-01-03 DIAGNOSIS — E785 Hyperlipidemia, unspecified: Secondary | ICD-10-CM | POA: Diagnosis not present

## 2021-01-03 DIAGNOSIS — I480 Paroxysmal atrial fibrillation: Secondary | ICD-10-CM | POA: Diagnosis not present

## 2021-01-03 NOTE — Progress Notes (Signed)
Primary Care Physician: [name], MD Referring Physician: [name] is a 70 y.o. female with a h/o paroxysmal afib that is in the afib clinic 11/29/19  for persistent afib that had been present since the  previous  Sunday. She was scheduled for a cardioversion in 2019 but converted herself and DCCV was cancelled. Last  cardioversion was 2017. This  is the first afib that she  has had since 2019 that persisted for more than a few minutes.  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 She feels the stress from that may have been  her trigger. No missed doses of eliquis for the last 3 weeks, CHA2DS2VASc score of 4. Has OSA but intolerant to cpap.  F/u in afib clinic, 12/30/19. She had successful cardioversion 12/07/19 and continues in SR. She feels improved.   Asked to be seen today as she felt weak today. EKG shows junctional rhythm at 46 bpm. She does not remember taking any extra medicine by mistake. She  felt well yesterday.   F/u in afib clinic, 04/16/20 as pt went into afib last week. She  had an back injection the end of May and had to be off anticoagulation x 4 days. She restarted eliquis  5/26. She  is rate controlled today but feels fatigued. She has not taken BB on a regular basis since she had a day of bradycardia, she is taking prn.   F/u in afib clinic, 08/09/20. Pt asked to be seen today for ongoing afib x at least  3 weeks. She has afib with v rates in the 130's today. She feels short of breath with exertion and is fatigued. In June she went into afib and was set up for cardioversion but she self converted. She was waiting to see if she could convert on her own.   F/u in afib clinic, 08/20/20, she unfortunately did not shock out with cardioversion. She is here to discuss options to restore SR.  I feel Kayla Hendrix is her best option as she has a first degree AVB with IRBBB at baseline, on the young side for amiodarone. I feel Multaq may  be too expensive with her current drug plan and with her being in persistent  afib x several weeks, may not do the job to get her back in rhythm. Therefore we discussed admission for Tikosyn which she is in agreement. She is currently out of work since the cardioversion. She is very winded at work as she has to walk a long distance. I did give her lasix last week before the cardioversion and her weight is down almost 6 lbs this week.   F/u in afib clinic,09/24/20. Kayla Hendrix  was not able to come into the hospital for [name] admit as she had a very low magnesium on 600 mg magnesium a day. Her qt was borderline and with the low magnesium, it was felt that tikosyn may not work for pt per Dr. Rayann Heman. It was decided to use amiodarone as an bridge to ablation. The pt on f/u stated that she  could not tolerate the 200 mg bid of amiodarone  because of anorexia and nausea. I lowered the dose of amiodarone to  200 mg qd last week and the pt today states that the nausea has improved but still present and she also has some diarrhea. She  is c/o of more shortness of breath as well with  the amiodarone. She will stop amiodarone, increase metoprolol tartrate to 50 mg bid for better rate control and I will update her echo and have her f/u with Dr. Rayann Heman to discuss ablation. She  has been out of work since the  anticipated hospitalization for tikosyn and then  the intolerance of amiodarone. She states that it is difficult to walk into to work as she  has to walk a longer distance and up stairs to her desk because of covid screening. I asked if she could get an exception to come into the door closer to her work and she thinks that the employer would not allow this.   F/u in afib clinic, 12/19/20. Since I saw pt last she was referred  to Dr. Rayann Heman for an ablation which was performed 11/16/19. EKG shows today afib with RVR but pt states she is rate controlled at home. She feels she was in rhythm for at least one week and felt so  much better. Has been in afib x 2 1/2 weeks. No swallowing or groin issues. Continues  on eliquis 5 mg bid for CHA2DS2VASc of 4.    F/u  successful cardioversion 01/04/21. She remains in SR today. She still feels fatigue and may be partially related to the fact that she is anemic. She plans to have colonoscopy/endoscopy after she has the 3 month f/u with Dr. Rayann Heman in April, so she can stop anticoagulation. . She has had esophageal and colon polyps in the past.   Today, she denies symptoms of palpitations, chest pain, shortness of breath, orthopnea, PND, lower extremity edema, dizziness, presyncope, syncope, or neurologic sequela. The patient is tolerating medications without difficulties and is otherwise without complaint today.   Past Medical History:  Diagnosis Date  . Allergy   . Anemia   . Anxiety   . Atrial fibrillation (HCC)    paroxysmal only asa 81 mg, no other blood thinner, the xarelto she was on gave her HAs  . Barrett's esophagus 2018  . CAD (coronary artery disease)    cath 2006- nonobstructive CAD  . Cataract   . Depression   . Diabetes mellitus without complication ([place])   . Gastric polyp   . GERD (gastroesophageal reflux disease)   . Glucose intolerance (impaired glucose tolerance)   . Hiatal hernia   . History of transesophageal echocardiography (TEE) for monitoring    TEE 3/17:  EF 55%, no RWMA, mild plaque in descending aorta, mild MR, mod BAE, normal RVF  . HL (hearing loss)   . HTN (hypertension)   . Hyperlipidemia   . Hyperplastic colon polyp   . Internal hemorrhoids   . Intestinal metaplasia of gastric mucosa   . Obesity   . OSA (obstructive sleep apnea)    noncompliant with CPAP  . Tubular adenoma of colon    Past Surgical History:  Procedure Laterality Date  . ATRIAL FIBRILLATION ABLATION N/A 11/15/2020   Procedure: ATRIAL FIBRILLATION ABLATION;  Surgeon: Thompson Grayer, MD;  Location: [place] [facility];  Service: Cardiovascular;  Laterality: N/A;  .  BIOPSY  10/17/2019   Procedure: BIOPSY;  Surgeon: Jerene Bears, MD;  Location: [facility];  Service: Gastroenterology;;  . CARDIOVERSION N/A 01/23/2016   Procedure: CARDIOVERSION;  Surgeon: Larey Dresser, MD;  Location: [facility]OSCOPY;  Service: Cardiovascular;  Laterality: N/A;  . CARDIOVERSION N/A 12/07/2019   Procedure: CARDIOVERSION;  Surgeon: Pixie Casino, MD;  Location: [facility]COPY;  Service: Cardiovascular;  Laterality: N/A;  . CARDIOVERSION N/A 08/15/2020  Procedure: CARDIOVERSION;  Surgeon: Werner Lean, MD;  Location: [place];  Service: Cardiovascular;  Laterality: N/A;  . CARDIOVERSION N/A 12/27/2020   Procedure: CARDIOVERSION;  Surgeon: Jerline Pain, MD;  Location: [place];  Service: Cardiovascular;  Laterality: N/A;  . CESAREAN SECTION    . ESOPHAGOGASTRODUODENOSCOPY (EGD) WITH PROPOFOL N/A 10/17/2019   Procedure: ESOPHAGOGASTRODUODENOSCOPY (EGD) WITH PROPOFOL;  Surgeon: Jerene Bears, MD;  Location: [facility];  Service: Gastroenterology;  Laterality: N/A;  . GANGLION CYST EXCISION Left 1970s  . HEMOSTASIS CLIP PLACEMENT  10/17/2019   Procedure: HEMOSTASIS CLIP PLACEMENT;  Surgeon: Jerene Bears, MD;  Location: [facility] ENDOSCOPY;  Service: Gastroenterology;;  . POLYPECTOMY  10/17/2019   Procedure: POLYPECTOMY;  Surgeon: Jerene Bears, MD;  Location: [facility];  Service: Gastroenterology;;  . TEE WITHOUT CARDIOVERSION N/A 01/23/2016   Procedure: TRANSESOPHAGEAL ECHOCARDIOGRAM (TEE);  Surgeon: Larey Dresser, MD;  Location: [place];  Service: Cardiovascular;  Laterality: N/A;  . TOTAL ABDOMINAL HYSTERECTOMY    . WRIST FRACTURE SURGERY  2010   wrist fracture repair with metal rod    Current Outpatient Medications  Medication Sig Dispense Refill  . apixaban (ELIQUIS) 5 MG TABS tablet Take 1 tablet (5 mg total) by mouth 2 (two) times daily. 90 tablet 3  . atorvastatin (LIPITOR) 40 MG tablet Take 40 mg by mouth at bedtime.    . clonazePAM (KLONOPIN)  0.5 MG tablet Take 0.5 mg by mouth daily.    Marland Kitchen diltiazem (TIAZAC) 360 MG 24 hr capsule Take 360 mg by mouth daily.    . Ferrous Sulfate (IRON) 325 (65 Fe) MG TABS Take 325 mg by mouth 2 (two) times a week.    . furosemide (LASIX) 20 MG tablet Take 1.5 tablets by mouth daily for fluid (Patient taking differently: Take 30 mg by mouth daily.) 45 tablet 6  . gabapentin (NEURONTIN) 300 MG capsule Take 300 mg by mouth 2 (two) times daily.    Marland Kitchen glimepiride (AMARYL) 4 MG tablet Take 4 mg by mouth daily in the afternoon.  99  . glucose blood (ACCU-CHEK AVIVA PLUS) test strip CHECK BLOOD SUGAR TWICE DAILY AS DIRECTED    . metFORMIN (GLUCOPHAGE) 500 MG tablet Take 1,000 mg by mouth in the morning and at bedtime.    . metoprolol tartrate (LOPRESSOR) 25 MG tablet Take 1 tablet (25 mg total) by mouth in the morning and at bedtime. (Patient taking differently: Take 37.5 mg by mouth in the morning and at bedtime.) 60 tablet 6  . omeprazole (PRILOSEC) 20 MG capsule Take 20 mg by mouth daily.    . sitaGLIPtin (JANUVIA) 100 MG tablet Take 100 mg by mouth daily.     No current facility-administered medications for this encounter.    Allergies  Allergen Reactions  . Amiodarone Diarrhea and Nausea Only  . Hydrocodone Itching    Social History   Socioeconomic History  . Marital status: Single    Spouse name: Not on file  . Number of children: 1  . Years of education: Not on file  . Highest education level: Not on file  Occupational History  . Not on file  Tobacco Use  . Smoking status: Former Smoker    Quit date: 2010    Years since quitting: 12.1  . Smokeless tobacco: Never Used  . Tobacco comment: 15-pack-year smoker  Vaping Use  . Vaping Use: Never used  Substance and Sexual Activity  . Alcohol use: No    Comment: rarely  .  Drug use: No  . Sexual activity: Not on file  Other Topics Concern  . Not on file  Social History Narrative   Pt. Lives in [place] with her son. 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    Social Determinants of Health   Financial Resource Strain: Not on file  Food Insecurity: Not on file  Transportation Needs: Not on file  Physical Activity: Not on file  Stress: Not on file  Social Connections: Not on file  Intimate Partner Violence: Not on file    Family History  Problem Relation Age of Onset  . Cancer Father   . Stomach cancer Father   . Diabetes Other        noninsulin dependant DM  . Hypertension Other   . Stomach cancer Maternal Grandmother   . Colon cancer Neg Hx   . Colon polyps Neg Hx   . Esophageal cancer Neg Hx   . Rectal cancer Neg Hx     ROS- All systems are reviewed and negative except as per the HPI above  Physical Exam: There were no vitals filed for this visit. Wt Readings from Last 3 Encounters:  12/27/20 104.3 kg  12/19/20 104.7 kg  11/15/20 105.2 kg    Labs: Lab Results  Component Value Date   NA 141 12/19/2020   K 4.3 12/19/2020   CL 101 12/19/2020   CO2 27 12/19/2020   GLUCOSE 99 12/19/2020   BUN 12 12/19/2020   CREATININE 0.92 12/19/2020   CALCIUM 8.5 (L) 12/19/2020   MG 1.5 (L) 09/04/2020   Lab Results  Component Value Date   INR 1.0 04/08/2012   Lab Results  Component Value Date   CHOL 159 04/10/2012   HDL 34 (L) 04/10/2012   LDLCALC 79 04/10/2012   TRIG 230 (H) 04/10/2012     GEN- The patient is well appearing, alert and oriented x 3 today.   Head- normocephalic, atraumatic Eyes-  Sclera clear, conjunctiva pink Ears- hearing intact Oropharynx- clear Neck- supple, no JVP Lymph- no cervical lymphadenopathy Lungs- Clear to ausculation bilaterally, normal work of breathing Heart regular rate and rhythm, no murmurs, rubs or gallops, PMI not laterally displaced GI- soft, NT, ND, + BS Extremities- no clubbing, cyanosis, or edema MS- no significant deformity or atrophy Skin- no rash or lesion Psych-  euthymic mood, full affect Neuro- strength and sensation are intact  EKG-  Sinus brady at 57 bpm, pr int 210 ms, qrs innt 94 ms, qtc 461 ms   Cath 2006- CONCLUSION:  1.  Well-preserved overall left ventricular function.  2.  Calcification of mid left anterior descending artery without critical    stenosis.   Echo-  09/24/20-  1. Left ventricular ejection fraction, by estimation, is 60 to 65%. The  left ventricle has normal function. The left ventricle has no regional  wall motion abnormalities. There is mild concentric left ventricular  hypertrophy. Left ventricular diastolic  function could not be evaluated.  2. Right ventricular systolic function is normal. The right ventricular  size is normal.  3. Left atrial size was mildly dilated.  4. The mitral valve is normal in structure. Mild mitral valve  regurgitation. No evidence of mitral stenosis.  5. The aortic valve is normal in structure. Aortic valve regurgitation is  not visualized. No aortic stenosis is present.  6. The inferior vena cava is normal in size with greater than  50%  respiratory variability, suggesting right atrial pressure of 3 mmHg.   Assessment and Plan: 1. Persistent  afib  She failed cardioversion 08/15/20, her afib burden has increased over the last year She was not an candidate for Tikosyn due to hypomagnesemia and borderline qt She has failed amiodarone for GI intolerance, nausea and diarrhea  She is now one month s/p ablation 11/15/20 Only maintained SR x one week Cardioversion 2/17 successful and staying in SR  Continue BB at current dose    2. HTN Stable   3. CHA2DS2VASc score of 4 Continue  eliquis 5 mg bid    4. Anemia Pt is planning to have GI w/u when given the clearance by Dr. Rayann Heman in April    F/u here as needed Dr. Rayann Heman as scheulded 4/13  Butch Penny C. Shadrack Brummitt, [facility] [address] [place], [place] [address] [phone]

## 2021-02-13 ENCOUNTER — Other Ambulatory Visit (HOSPITAL_COMMUNITY): Payer: Self-pay | Admitting: Nurse Practitioner

## 2021-02-13 ENCOUNTER — Other Ambulatory Visit (HOSPITAL_COMMUNITY): Payer: Self-pay

## 2021-02-13 MED ORDER — METOPROLOL TARTRATE 25 MG PO TABS: 25.0000 mg | ORAL_TABLET | Freq: Two times a day (BID) | ORAL | 6 refills | Status: DC

## 2021-02-20 ENCOUNTER — Encounter: Payer: Self-pay | Admitting: Internal Medicine

## 2021-02-20 ENCOUNTER — Ambulatory Visit: Payer: BC Managed Care – PPO | Admitting: Internal Medicine

## 2021-02-20 ENCOUNTER — Other Ambulatory Visit: Payer: Self-pay

## 2021-02-20 ENCOUNTER — Other Ambulatory Visit (HOSPITAL_COMMUNITY): Payer: Self-pay | Admitting: Nurse Practitioner

## 2021-02-20 VITALS — BP 138/60 | HR 70 | Ht 66.0 in | Wt 234.8 lb

## 2021-02-20 DIAGNOSIS — I4819 Other persistent atrial fibrillation: Secondary | ICD-10-CM | POA: Diagnosis not present

## 2021-02-20 DIAGNOSIS — G4733 Obstructive sleep apnea (adult) (pediatric): Secondary | ICD-10-CM

## 2021-02-20 DIAGNOSIS — I10 Essential (primary) hypertension: Secondary | ICD-10-CM

## 2021-02-20 NOTE — Patient Instructions (Addendum)
Medication Instructions:  Your physician recommends that you continue on your current medications as directed. Please refer to the Current Medication list given to you today.  Labwork: None ordered.  Testing/Procedures: None ordered.  Follow-Up: Your physician wants you to follow-up in: 06/05/21 at 4 pm with Thompson Grayer, MD   Any Other Special Instructions Will Be Listed Below (If Applicable).  If you need a refill on your cardiac medications before your next appointment, please call your pharmacy.

## 2021-02-20 NOTE — Progress Notes (Signed)
PCP: [name], MD    Kayla Hendrix is a 70 y.o. female who presents today for routine electrophysiology followup.  Since his recent afib ablation, the patient reports doing very well.  she denies procedure related complications and is pleased with the results of the procedure.  Today, she denies symptoms of palpitations, chest pain, shortness of breath,  lower extremity edema, dizziness, presyncope, or syncope.  The patient is otherwise without complaint today.   Past Medical History:  Diagnosis Date  . Allergy   . Anemia   . Anxiety   . Atrial fibrillation (HCC)    paroxysmal only asa 81 mg, no other blood thinner, the xarelto she was on gave her HAs  . Barrett's esophagus 2018  . CAD (coronary artery disease)    cath 2006- nonobstructive CAD  . Cataract   . Depression   . Diabetes mellitus without complication ([place])   . Gastric polyp   . GERD (gastroesophageal reflux disease)   . Glucose intolerance (impaired glucose tolerance)   . Hiatal hernia   . History of transesophageal echocardiography (TEE) for monitoring    TEE 3/17:  EF 55%, no RWMA, mild plaque in descending aorta, mild MR, mod BAE, normal RVF  . HL (hearing loss)   . HTN (hypertension)   . Hyperlipidemia   . Hyperplastic colon polyp   . Internal hemorrhoids   . Intestinal metaplasia of gastric mucosa   . Obesity   . OSA (obstructive sleep apnea)    noncompliant with CPAP  . Tubular adenoma of colon    Past Surgical History:  Procedure Laterality Date  . ATRIAL FIBRILLATION ABLATION N/A 11/15/2020   Procedure: ATRIAL FIBRILLATION ABLATION;  Surgeon: Thompson Grayer, MD;  Location: [place] [facility];  Service: Cardiovascular;  Laterality: N/A;  . BIOPSY  10/17/2019   Procedure: BIOPSY;  Surgeon: Jerene Bears, MD;  Location: [facility];  Service: Gastroenterology;;  . CARDIOVERSION N/A 01/23/2016   Procedure: CARDIOVERSION;  Surgeon: Larey Dresser, MD;  Location: [place];  Service: Cardiovascular;   Laterality: N/A;  . CARDIOVERSION N/A 12/07/2019   Procedure: CARDIOVERSION;  Surgeon: Pixie Casino, MD;  Location: [facility] ENDOSCOPY;  Service: Cardiovascular;  Laterality: N/A;  . CARDIOVERSION N/A 08/15/2020   Procedure: CARDIOVERSION;  Surgeon: Werner Lean, MD;  Location: [facility] ENDOSCOPY;  Service: Cardiovascular;  Laterality: N/A;  . CARDIOVERSION N/A 12/27/2020   Procedure: CARDIOVERSION;  Surgeon: Jerline Pain, MD;  Location: [place];  Service: Cardiovascular;  Laterality: N/A;  . CESAREAN SECTION    . ESOPHAGOGASTRODUODENOSCOPY (EGD) WITH PROPOFOL N/A 10/17/2019   Procedure: ESOPHAGOGASTRODUODENOSCOPY (EGD) WITH PROPOFOL;  Surgeon: Jerene Bears, MD;  Location: [facility];  Service: Gastroenterology;  Laterality: N/A;  . GANGLION CYST EXCISION Left 1970s  . HEMOSTASIS CLIP PLACEMENT  10/17/2019   Procedure: HEMOSTASIS CLIP PLACEMENT;  Surgeon: Jerene Bears, MD;  Location: [facility];  Service: Gastroenterology;;  . POLYPECTOMY  10/17/2019   Procedure: POLYPECTOMY;  Surgeon: Jerene Bears, MD;  Location: [facility];  Service: Gastroenterology;;  . TEE WITHOUT CARDIOVERSION N/A 01/23/2016   Procedure: TRANSESOPHAGEAL ECHOCARDIOGRAM (TEE);  Surgeon: Larey Dresser, MD;  Location: [place];  Service: Cardiovascular;  Laterality: N/A;  . TOTAL ABDOMINAL HYSTERECTOMY    . WRIST FRACTURE SURGERY  2010   wrist fracture repair with metal rod    ROS- all systems are personally reviewed and negatives except as per HPI above  Current Outpatient Medications  Medication Sig Dispense Refill  .  acetaminophen (TYLENOL) 500 MG tablet Take 500 mg by mouth every 6 (six) hours as needed for mild pain or headache.    Marland Kitchen apixaban (ELIQUIS) 5 MG TABS tablet Take 1 tablet (5 mg total) by mouth 2 (two) times daily. 90 tablet 3  . atorvastatin (LIPITOR) 40 MG tablet Take 40 mg by mouth at bedtime.    . clonazePAM (KLONOPIN) 0.5 MG tablet Take 0.5 mg by mouth daily.    Marland Kitchen diltiazem  (TIAZAC) 360 MG 24 hr capsule Take 360 mg by mouth daily.    . Ferrous Sulfate (IRON) 325 (65 Fe) MG TABS Take 325 mg by mouth 2 (two) times a week.    . furosemide (LASIX) 20 MG tablet Take 1.5 tablets by mouth daily for fluid 45 tablet 6  . gabapentin (NEURONTIN) 300 MG capsule Take 300 mg by mouth 2 (two) times daily.    Marland Kitchen glimepiride (AMARYL) 4 MG tablet Take 4 mg by mouth daily in the afternoon.  99  . glucose blood (ACCU-CHEK AVIVA PLUS) test strip CHECK BLOOD SUGAR TWICE DAILY AS DIRECTED    . metFORMIN (GLUCOPHAGE) 500 MG tablet Take 1,000 mg by mouth in the morning and at bedtime.    . metoprolol tartrate (LOPRESSOR) 25 MG tablet Take 1 tablet (25 mg total) by mouth in the morning and at bedtime. 60 tablet 6  . omeprazole (PRILOSEC) 20 MG capsule Take 20 mg by mouth daily.    . sitaGLIPtin (JANUVIA) 100 MG tablet Take 100 mg by mouth daily.     No current facility-administered medications for this visit.    Physical Exam: Vitals:   02/20/21 1623  BP: 138/60  Pulse: 70  SpO2: 92%  Weight: 234 lb 12.8 oz (106.5 kg)  Height: 5\' 6"  (1.676 m)    GEN- The patient is well appearing, alert and oriented x 3 today.   Head- normocephalic, atraumatic Eyes-  Sclera clear, conjunctiva pink Ears- hearing intact Oropharynx- clear Lungs- Clear to ausculation bilaterally, normal work of breathing Heart- Regular rate and rhythm, no murmurs, rubs or gallops, PMI not laterally displaced GI- soft, NT, ND, + BS Extremities- no clubbing, cyanosis, or edema  EKG tracing ordered today is personally reviewed and shows sinus rhythm 70 bpm, PR 196 msec, incomplete RBBB, QRS 96 msec  Assessment and Plan:  1. Persistent atrial fibrillation Doing well s/p ablation chads2vasc score is 4.  She is on eliquis  2. HTN Stable No change required today  3. OSA Not compliant with CPAP  4. Obesity Body mass index is 37.9 kg/m. Lifestyle modification is advised   Return to see me in 3  months  Thompson Grayer MD, [facility] 02/20/2021 4:28 PM

## 2021-03-10 ENCOUNTER — Other Ambulatory Visit (HOSPITAL_COMMUNITY): Payer: Self-pay | Admitting: Nurse Practitioner

## 2021-03-18 ENCOUNTER — Ambulatory Visit
Admission: RE | Admit: 2021-03-18 | Discharge: 2021-03-18 | Disposition: A | Discharge status: Home / Self Care | Payer: BC Managed Care – PPO | Source: Ambulatory Visit | Attending: Internal Medicine | Admitting: Internal Medicine

## 2021-03-18 ENCOUNTER — Other Ambulatory Visit: Payer: Self-pay

## 2021-03-18 DIAGNOSIS — Z13820 Encounter for screening for osteoporosis: Secondary | ICD-10-CM

## 2021-03-18 DIAGNOSIS — M85851 Other specified disorders of bone density and structure, right thigh: Secondary | ICD-10-CM | POA: Diagnosis not present

## 2021-03-18 DIAGNOSIS — Z1231 Encounter for screening mammogram for malignant neoplasm of breast: Secondary | ICD-10-CM | POA: Diagnosis not present

## 2021-03-18 DIAGNOSIS — Z780 Asymptomatic menopausal state: Secondary | ICD-10-CM | POA: Diagnosis not present

## 2021-03-21 ENCOUNTER — Ambulatory Visit: Payer: BC Managed Care – PPO | Admitting: Physician Assistant

## 2021-04-22 ENCOUNTER — Telehealth: Payer: Self-pay | Admitting: *Deleted

## 2021-04-22 ENCOUNTER — Telehealth: Payer: Self-pay

## 2021-04-22 ENCOUNTER — Ambulatory Visit: Payer: BC Managed Care – PPO | Admitting: Physician Assistant

## 2021-04-22 ENCOUNTER — Encounter: Payer: Self-pay | Admitting: Physician Assistant

## 2021-04-22 VITALS — BP 126/56 | HR 60 | Ht 64.5 in | Wt 234.0 lb

## 2021-04-22 DIAGNOSIS — K219 Gastro-esophageal reflux disease without esophagitis: Secondary | ICD-10-CM | POA: Diagnosis not present

## 2021-04-22 DIAGNOSIS — K317 Polyp of stomach and duodenum: Secondary | ICD-10-CM

## 2021-04-22 DIAGNOSIS — Z86010 Personal history of colonic polyps: Secondary | ICD-10-CM

## 2021-04-22 DIAGNOSIS — K2270 Barrett's esophagus without dysplasia: Secondary | ICD-10-CM | POA: Diagnosis not present

## 2021-04-22 MED ORDER — SUTAB 1479-225-188 MG PO TABS: 1.0000 | ORAL_TABLET | ORAL | 0 refills | Status: DC

## 2021-04-22 NOTE — Telephone Encounter (Signed)
    Kayla Hendrix DOB:  09-15-1951  MRN:  [id number]   Primary Cardiologist: Dr. Rayann Heman, MD   Chart reviewed as part of pre-operative protocol coverage. Given past medical history and time since last visit, based on ACC/AHA guidelines, [name] would be at acceptable risk for the planned procedure without further cardiovascular testing.   Patient with diagnosis of afib on Eliquis for anticoagulation.     Procedure: endoscopy/colonoscopy Date of procedure: 07/17/21   CHA2DS2-VASc Score = 5  This indicates a 7.2% annual risk of stroke. The patient's score is based upon: CHF History: No HTN History: Yes Diabetes History: Yes Stroke History: No Vascular Disease History: Yes Age Score: 1 Gender Score: 1   CrCl 69 ml/min Platelet count 263   Per office protocol, patient can hold Eliquis for 2 days prior to procedure.     I will route this recommendation to the requesting party via Epic fax function and remove from pre-op pool.  Please call with questions.  Kathyrn Drown, NP 04/22/2021, 5:03 PM

## 2021-04-22 NOTE — Progress Notes (Signed)
Subjective:    Patient ID: Kayla Hendrix, female    DOB: 20-Oct-1951, 70 y.o.   MRN: [id number]  HPI Kayla Hendrix is a pleasant 70 year old white female, established with Dr. Hilarie Fredrickson who comes in today to discuss recall colonoscopy and EGD.  She is on chronic Eliquis for atrial fibrillation, followed by Dr. Rayann Heman. Also with history of hypertension, sleep apnea without CPAP use, GERD, and obesity. Patient has history of adenomatous colon polyps, last had colonoscopy July 2018 with 5 polyps removed the largest 5 mm and also noted to have internal hemorrhoids.  Biopsy showed 4 tubular adenomas and 1 hyperplastic polyp.  She was indicated for 3-year interval follow-up. She also has history of short segment Barrett's and multiple gastric polyps.  She was found to have a gastric carcinoid on EGD December 2020.  At that time she had multiple pedunculated and sessile gastric polyps measuring 2 to 10 mm.  4 of these polyps were larger than 5 mm and removed and 1 of these was a gastric carcinoid/well differentiated neuroendocrine tumor. Biopsies also showed Barrett's esophagus without dysplasia. She is currently maintained on omeprazole 20 mg p.o. daily.  She has no complaints of dysphagia or odynophagia, no complaints of heartburn or indigestion.  She does occasionally get a transient substernal/epigastric pain that is random and she attributes to GERD. She also describes some intermittent urgency for bowel movements which seems to be completely sporadic.  She had an episode a week or so ago with associated incontinence.  She is not aware of any specific food intolerances or triggers for these episodes which fortunately occur infrequently.  She does not believe that she is lactose intolerant, does use artificial sweeteners.   Review of Systems Pertinent positive and negative review of systems were noted in the above HPI section.  All other review of systems was otherwise negative.   Outpatient Encounter  Medications as of 04/22/2021  Medication Sig   acetaminophen (TYLENOL) 500 MG tablet Take 500 mg by mouth every 6 (six) hours as needed for mild pain or headache.   apixaban (ELIQUIS) 5 MG TABS tablet Take 1 tablet (5 mg total) by mouth 2 (two) times daily.   atorvastatin (LIPITOR) 40 MG tablet Take 40 mg by mouth at bedtime.   clonazePAM (KLONOPIN) 0.5 MG tablet Take 0.5 mg by mouth daily.   diltiazem (TIAZAC) 360 MG 24 hr capsule Take 360 mg by mouth daily.   Ferrous Sulfate (IRON) 325 (65 Fe) MG TABS Take 325 mg by mouth as needed.   furosemide (LASIX) 20 MG tablet Take 1.5 tablets by mouth daily for fluid   gabapentin (NEURONTIN) 300 MG capsule Take 300 mg by mouth 2 (two) times daily.   glimepiride (AMARYL) 4 MG tablet Take 4 mg by mouth daily in the afternoon.   glucose blood (ACCU-CHEK AVIVA PLUS) test strip CHECK BLOOD SUGAR TWICE DAILY AS DIRECTED   metFORMIN (GLUCOPHAGE) 500 MG tablet Take 1,000 mg by mouth in the morning and at bedtime.   metoprolol tartrate (LOPRESSOR) 25 MG tablet Take 1 tablet (25 mg total) by mouth in the morning and at bedtime.   omeprazole (PRILOSEC) 20 MG capsule Take 20 mg by mouth daily.   sitaGLIPtin (JANUVIA) 100 MG tablet Take 100 mg by mouth daily.   Sodium Sulfate-Mag Sulfate-KCl (SUTAB) [phone] MG TABS Take 1 kit by mouth as directed.   No facility-administered encounter medications on file as of 04/22/2021.   Allergies  Allergen Reactions   Amiodarone Diarrhea and  Nausea Only   Hydrocodone Itching   Patient Active Problem List   Diagnosis Date Noted   Gastric polyp    Loose stools    Carcinoid tumor of gastrointestinal tract    Chronic anticoagulation 08/01/2019   Fatty liver 08/01/2019   Gallstone (impacted) 08/01/2019   Obesity with body mass index greater than 30 08/01/2019   ATRIAL FIBRILLATION 02/11/2010   SHORTNESS OF BREATH 02/11/2010   HYPERCHOLESTEROLEMIA 08/06/2009   TOBACCO ABUSE 08/06/2009   Obstructive sleep apnea  08/03/2009   Essential hypertension 08/03/2009   GERD 08/03/2009   Social History   Socioeconomic History   Marital status: Single    Spouse name: Not on file   Number of children: 1   Years of education: Not on file   Highest education level: Not on file  Occupational History   Not on file  Tobacco Use   Smoking status: Former    Pack years: 0.00    Types: Cigarettes    Quit date: 2010    Years since quitting: 12.4   Smokeless tobacco: Never   Tobacco comments:    15-pack-year smoker  Vaping Use   Vaping Use: Never used  Substance and Sexual Activity   Alcohol use: No    Comment: rarely   Drug use: No   Sexual activity: Not on file  Other Topics Concern   Not on file  Social History Narrative   Pt. Lives in [place] with her son. 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    Social Determinants of Health   Financial Resource Strain: Not on file  Food Insecurity: Not on file  Transportation Needs: Not on file  Physical Activity: Not on file  Stress: Not on file  Social Connections: Not on file  Intimate Partner Violence: Not on file    Kayla Hendrix's family history includes Cancer in her father; Diabetes in an other family member; Hypertension in an other family member; Stomach cancer in her father and maternal grandmother.      Objective:    Vitals:   04/22/21 1532  BP: (!) 126/56  Pulse: 60    Physical Exam Well-developed well-nourished f older white female in no acute distress.  Height, Weight, 234 BMI 39.5  HEENT; nontraumatic normocephalic, EOMI, PE R LA, sclera anicteric. Oropharynx; not examined today Neck; supple, no JVD Cardiovascular; regular rate and rhythm with S1-S2, no murmur rub or gallop Pulmonary; Clear bilaterally kyphotic Abdomen; soft, obese, nontender, nondistended, no palpable mass or hepatosplenomegaly, bowel sounds are active Rectal; not done today Skin;  benign exam, no jaundice rash or appreciable lesions Extremities; no clubbing cyanosis or edema skin warm and dry Neuro/Psych; alert and oriented x4, grossly nonfocal mood and affect appropriate        Assessment & Plan:   #72 70 year old white female with history of adenomatous colon polyps due for follow-up colonoscopy, last exam July 2015 with removal of 4 tubular adenomas #2 history of Barrett's esophagus short segment, multiple gastric polyps and history of gastric carcinoid and a polyp removed December 2020.  Indicated for 1 year interval follow-up  #3 chronic GERD-stable #4 chronic anticoagulation-on Eliquis #5 atrial fibrillation 6.  Obesity 7.  Sleep apnea no CPAP or oxygen use 8.  Hypertension #9 intermittent sporadic postprandial urgency and loose stools Likely dietary trigger i.e. possible lactose or artificial sweeteners  Plan; patient will be scheduled for colonoscopy and upper endoscopy  with Dr. Hilarie Fredrickson both procedures were discussed in detail with the patient including indications risks and benefits and she is agreeable to proceed. Patient will need to hold Eliquis for 48 hours prior to procedure.  We will communicate with her cardiologist Dr. Rayann Heman to assure this is reasonable for this patient. Continue omeprazole 20 mg p.o. daily Offered a trial of an antispasmodic for intermittent postprandial urgency, she declines for now due to having to take multiple medications.  Patient advised to track lactose and artificial sweetener intake and perhaps back off on both in hopes of avoiding fecal urgency.     Raetta Agostinelli S Dakwan Pridgen PA-C 04/22/2021   Cc: [name], MD

## 2021-04-22 NOTE — Telephone Encounter (Signed)
Patient with diagnosis of afib on Eliquis for anticoagulation.    Procedure: endoscopy/colonoscopy Date of procedure: 07/17/21  CHA2DS2-VASc Score = 5  This indicates a 7.2% annual risk of stroke. The patient's score is based upon: CHF History: No HTN History: Yes Diabetes History: Yes Stroke History: No Vascular Disease History: Yes Age Score: 1 Gender Score: 1     CrCl 69 ml/min Platelet count 263  Per office protocol, patient can hold Eliquis for 2 days prior to procedure.

## 2021-04-22 NOTE — Telephone Encounter (Signed)
[facility] Pre-operative Risk Assessment     Request for surgical clearance:     Endoscopy Procedure  What type of surgery is being performed?     Endoscopy/Colonoscopy  When is this surgery scheduled?     07/17/2021  What type of clearance is required ?   Pharmacy  Are there any medications that need to be held prior to surgery and how long? Eliquis starting 2 days prior  Practice name and name of physician performing surgery?      [facility]  What is your office phone and fax number?      Phone- [phone]  [phone]  Anesthesia type (None, local, MAC, general) ?       MAC

## 2021-04-22 NOTE — Telephone Encounter (Signed)
    [name] DOB:  [birth year]  MRN:  [id number]   Primary Cardiologist: Dr. Rayann Heman, MD  Chart reviewed as part of pre-operative protocol coverage. Given past medical history and time since last visit, based on ACC/AHA guidelines, Kayla Hendrix would be at acceptable risk for the planned procedure without further cardiovascular testing.   Patient with diagnosis of afib on Eliquis for anticoagulation.     Procedure: ESI. lumbar Date of procedure: 04/27/21   CHA2DS2-VASc Score = 5  This indicates a 7.2% annual risk of stroke. The patient's score is based upon: CHF History: No HTN History: Yes Diabetes History: Yes Stroke History: No Vascular Disease History: Yes Age Score: 1 Gender Score: 1    CrCl 69 ml/min Platelet count 263   Per office protocol, patient can hold Eliquis for 3 days prior to procedure.    I will route this recommendation to the requesting party via Epic fax function and remove from pre-op pool.  Please call with questions.  Kathyrn Drown, NP 04/22/2021, 5:00 PM

## 2021-04-22 NOTE — Patient Instructions (Addendum)
If you are age 69 or older, your body mass index should be between 23-30. Your Body mass index is 39.55 kg/m. If this is out of the aforementioned range listed, please consider follow up with your Primary Care Provider. __________________________________________________________  The [facility] providers would like to encourage you to use [facility] to communicate with providers for non-urgent requests or questions.  Due to long hold times on the telephone, sending your provider a message by [facility] may be a faster and more efficient way to get a response.  Please allow 48 business hours for a response.  Please remember that this is for non-urgent requests.   You have been scheduled for an endoscopy and colonoscopy. Please follow the written instructions given to you at your visit today. Please pick up your prep supplies at the pharmacy within the next 1-3 days. If you use inhalers (even only as needed), please bring them with you on the day of your procedure.  You will be contacted by our office prior to your procedure for directions on holding your Eliquis.  If you do not hear from our office 1 week prior to your scheduled procedure, please call [phone] to discuss.   Monitor your Lactose and artificial sweetener intake   Follow up as needed.  Thank you for entrusting me with your care and choosing [facility].  Amy Esterwood, PA-C

## 2021-04-22 NOTE — Telephone Encounter (Signed)
   [facility] Pre-operative Risk Assessment    Patient Name: Kayla Hendrix  DOB: 08-09-1951  MRN: [id number]   HEARTCARE STAFF: - Please ensure there is not already an duplicate clearance open for this procedure. - Under Visit Info/Reason for Call, type in Other and utilize the format Clearance MM/DD/YY or Clearance TBD. Do not use dashes or single digits. - If request is for dental extraction, please clarify the # of teeth to be extracted. - If the patient is currently at the dentist's office, call Pre-Op APP to address. If the patient is not currently in the dentist office, please route to the Pre-Op pool  Request for surgical clearance:  What type of surgery is being performed? ESI, LUMBAR   When is this surgery scheduled? 04/27/21 (STAT)   What type of clearance is required (medical clearance vs. Pharmacy clearance to hold med vs. Both)? BOTH  Are there any medications that need to be held prior to surgery and how long? ELIQUIS x 3 DAYS PER DR. Eagle name and name of physician performing surgery? [facility]; DR. Nelva Bush  What is the office phone number? [phone]   7.   What is the office fax number? Kayla Hendrix.   Anesthesia type (None, local, MAC, general) ? NONE LISTED   Kayla Hendrix 04/22/2021, 4:37 PM  _________________________________________________________________   (provider comments below)

## 2021-04-22 NOTE — Telephone Encounter (Signed)
Patient with diagnosis of afib on Eliquis for anticoagulation.    Procedure: ESI. lumbar Date of procedure: 04/27/21  CHA2DS2-VASc Score = 5  This indicates a 7.2% annual risk of stroke. The patient's score is based upon: CHF History: No HTN History: Yes Diabetes History: Yes Stroke History: No Vascular Disease History: Yes Age Score: 1 Gender Score: 1    CrCl 69 ml/min Platelet count 263  Per office protocol, patient can hold Eliquis for 3 days prior to procedure.

## 2021-04-23 NOTE — Progress Notes (Signed)
Addendum: Reviewed and agree with assessment and management plan. Shareta Fishbaugh M, MD  

## 2021-04-25 NOTE — Telephone Encounter (Signed)
OUR OFFICE RECEIVED DUPLICATE CLEARANCE REQUEST. CLEARANCE WAS PREVIOUSLY FAXED TO SURGEON's OFFICE. I WILL RE-FAX CLEARANCE NOTES AGAIN TODAY 04/25/21.

## 2021-04-26 NOTE — Telephone Encounter (Signed)
Left message for patient to call back  

## 2021-04-26 NOTE — Telephone Encounter (Signed)
I have spoken to patient to advise that we have gotten clearance from cardiology for her to hold Eliquis 2 days prior to her upcoming endoscopy/colonoscopy procedure in September. Patient verbalizes understanding of this information. She is also advised that should her she have any cardiac changes between now and the time of her procedure, she should make [place] aware.

## 2021-04-27 DIAGNOSIS — M5416 Radiculopathy, lumbar region: Secondary | ICD-10-CM | POA: Diagnosis not present

## 2021-05-01 DIAGNOSIS — H26493 Other secondary cataract, bilateral: Secondary | ICD-10-CM | POA: Diagnosis not present

## 2021-05-01 DIAGNOSIS — Z961 Presence of intraocular lens: Secondary | ICD-10-CM | POA: Diagnosis not present

## 2021-05-01 DIAGNOSIS — E119 Type 2 diabetes mellitus without complications: Secondary | ICD-10-CM | POA: Diagnosis not present

## 2021-05-10 DIAGNOSIS — E785 Hyperlipidemia, unspecified: Secondary | ICD-10-CM | POA: Diagnosis not present

## 2021-05-10 DIAGNOSIS — D649 Anemia, unspecified: Secondary | ICD-10-CM | POA: Diagnosis not present

## 2021-05-10 DIAGNOSIS — E11649 Type 2 diabetes mellitus with hypoglycemia without coma: Secondary | ICD-10-CM | POA: Diagnosis not present

## 2021-05-14 DIAGNOSIS — H26492 Other secondary cataract, left eye: Secondary | ICD-10-CM | POA: Diagnosis not present

## 2021-05-17 DIAGNOSIS — Z1339 Encounter for screening examination for other mental health and behavioral disorders: Secondary | ICD-10-CM | POA: Diagnosis not present

## 2021-05-17 DIAGNOSIS — E1142 Type 2 diabetes mellitus with diabetic polyneuropathy: Secondary | ICD-10-CM | POA: Diagnosis not present

## 2021-05-17 DIAGNOSIS — I2510 Atherosclerotic heart disease of native coronary artery without angina pectoris: Secondary | ICD-10-CM | POA: Diagnosis not present

## 2021-05-17 DIAGNOSIS — Z1331 Encounter for screening for depression: Secondary | ICD-10-CM | POA: Diagnosis not present

## 2021-05-17 DIAGNOSIS — Z23 Encounter for immunization: Secondary | ICD-10-CM | POA: Diagnosis not present

## 2021-05-17 DIAGNOSIS — Z0000 Encounter for general adult medical examination without abnormal findings: Secondary | ICD-10-CM | POA: Diagnosis not present

## 2021-05-31 ENCOUNTER — Other Ambulatory Visit (HOSPITAL_COMMUNITY): Payer: Self-pay | Admitting: *Deleted

## 2021-06-03 ENCOUNTER — Encounter (HOSPITAL_COMMUNITY): Payer: Self-pay

## 2021-06-03 ENCOUNTER — Inpatient Hospital Stay (HOSPITAL_COMMUNITY): Admission: RE | Admit: 2021-06-03 | Payer: BC Managed Care – PPO | Source: Ambulatory Visit

## 2021-06-03 ENCOUNTER — Encounter (HOSPITAL_COMMUNITY): Payer: BC Managed Care – PPO

## 2021-06-05 ENCOUNTER — Encounter: Payer: Self-pay | Admitting: Internal Medicine

## 2021-06-05 ENCOUNTER — Other Ambulatory Visit: Payer: Self-pay

## 2021-06-05 ENCOUNTER — Ambulatory Visit (INDEPENDENT_AMBULATORY_CARE_PROVIDER_SITE_OTHER): Payer: BC Managed Care – PPO | Admitting: Internal Medicine

## 2021-06-05 VITALS — BP 144/50 | HR 72 | Ht 66.0 in | Wt 240.2 lb

## 2021-06-05 DIAGNOSIS — I10 Essential (primary) hypertension: Secondary | ICD-10-CM | POA: Diagnosis not present

## 2021-06-05 DIAGNOSIS — I4819 Other persistent atrial fibrillation: Secondary | ICD-10-CM

## 2021-06-05 DIAGNOSIS — G4733 Obstructive sleep apnea (adult) (pediatric): Secondary | ICD-10-CM

## 2021-06-05 NOTE — Progress Notes (Signed)
PCP: [name], MD   Primary EP: Dr Lonia Chimera Regas is a 70 y.o. female who presents today for routine electrophysiology followup.  Since last being seen in our clinic, the patient reports doing reasonably well.   She is not very active.  She is primarily limited by back pain and hip pain.  She has edema and SOB despite lasix.  + rare palpitations.  Today, she denies symptoms of chest pain,  dizziness, presyncope, or syncope.  The patient is otherwise without complaint today.   Past Medical History:  Diagnosis Date   Allergy    Anemia    Anxiety    Atrial fibrillation (HCC)    paroxysmal only asa 81 mg, no other blood thinner, the xarelto she was on gave her HAs   Barrett's esophagus 2018   CAD (coronary artery disease)    cath 2006- nonobstructive CAD   Cataract    Depression    Diabetes mellitus without complication (HCC)    Gastric polyp    GERD (gastroesophageal reflux disease)    Glucose intolerance (impaired glucose tolerance)    Hiatal hernia    History of transesophageal echocardiography (TEE) for monitoring    TEE 3/17:  EF 55%, no RWMA, mild plaque in descending aorta, mild MR, mod BAE, normal RVF   HL (hearing loss)    HTN (hypertension)    Hyperlipidemia    Hyperplastic colon polyp    Internal hemorrhoids    Intestinal metaplasia of gastric mucosa    Obesity    OSA (obstructive sleep apnea)    noncompliant with CPAP   Tubular adenoma of colon    Past Surgical History:  Procedure Laterality Date   ATRIAL FIBRILLATION ABLATION N/A 11/15/2020   Procedure: ATRIAL FIBRILLATION ABLATION;  Surgeon: Thompson Grayer, MD;  Location: [facility];  Service: Cardiovascular;  Laterality: N/A;   BIOPSY  10/17/2019   Procedure: BIOPSY;  Surgeon: Jerene Bears, MD;  Location: [facility];  Service: Gastroenterology;;   CARDIOVERSION N/A 01/23/2016   Procedure: CARDIOVERSION;  Surgeon: Larey Dresser, MD;  Location: [facility];  Service: Cardiovascular;   Laterality: N/A;   CARDIOVERSION N/A 12/07/2019   Procedure: CARDIOVERSION;  Surgeon: Pixie Casino, MD;  Location: [facility];  Service: Cardiovascular;  Laterality: N/A;   CARDIOVERSION N/A 08/15/2020   Procedure: CARDIOVERSION;  Surgeon: Werner Lean, MD;  Location: [facility];  Service: Cardiovascular;  Laterality: N/A;   CARDIOVERSION N/A 12/27/2020   Procedure: CARDIOVERSION;  Surgeon: Jerline Pain, MD;  Location: [facility];  Service: Cardiovascular;  Laterality: N/A;   CESAREAN SECTION     ESOPHAGOGASTRODUODENOSCOPY (EGD) WITH PROPOFOL N/A 10/17/2019   Procedure: ESOPHAGOGASTRODUODENOSCOPY (EGD) WITH PROPOFOL;  Surgeon: Jerene Bears, MD;  Location: [facility];  Service: Gastroenterology;  Laterality: N/A;   GANGLION CYST EXCISION Left 1970s   HEMOSTASIS CLIP PLACEMENT  10/17/2019   Procedure: HEMOSTASIS CLIP PLACEMENT;  Surgeon: Jerene Bears, MD;  Location: [facility];  Service: Gastroenterology;;   POLYPECTOMY  10/17/2019   Procedure: POLYPECTOMY;  Surgeon: Jerene Bears, MD;  Location: [facility];  Service: Gastroenterology;;   TEE WITHOUT CARDIOVERSION N/A 01/23/2016   Procedure: TRANSESOPHAGEAL ECHOCARDIOGRAM (TEE);  Surgeon: Larey Dresser, MD;  Location: [facility] [facility];  Service: Cardiovascular;  Laterality: N/A;   TOTAL ABDOMINAL HYSTERECTOMY     WRIST FRACTURE SURGERY  2010   wrist fracture repair with metal rod    ROS- all systems are reviewed and negatives except  as per HPI above  Current Outpatient Medications  Medication Sig Dispense Refill   acetaminophen (TYLENOL) 500 MG tablet Take 500 mg by mouth every 6 (six) hours as needed for mild pain or headache.     apixaban (ELIQUIS) 5 MG TABS tablet Take 1 tablet (5 mg total) by mouth 2 (two) times daily. 90 tablet 3   atorvastatin (LIPITOR) 40 MG tablet Take 40 mg by mouth at bedtime.     clonazePAM (KLONOPIN) 0.5 MG tablet Take 0.5 mg by mouth daily.     diltiazem (TIAZAC) 360 MG 24 hr capsule  Take 360 mg by mouth daily.     Ferrous Sulfate (IRON) 325 (65 Fe) MG TABS Take 325 mg by mouth as needed.     furosemide (LASIX) 20 MG tablet Take 1.5 tablets by mouth daily for fluid 45 tablet 6   gabapentin (NEURONTIN) 300 MG capsule Take 300 mg by mouth 2 (two) times daily.     glimepiride (AMARYL) 4 MG tablet Take 4 mg by mouth daily in the afternoon.  99   glucose blood (ACCU-CHEK AVIVA PLUS) test strip CHECK BLOOD SUGAR TWICE DAILY AS DIRECTED     lisinopril (ZESTRIL) 10 MG tablet Take 10 mg by mouth daily.     metFORMIN (GLUCOPHAGE) 500 MG tablet Take 1,000 mg by mouth in the morning and at bedtime.     metoprolol tartrate (LOPRESSOR) 25 MG tablet Take 1 tablet (25 mg total) by mouth in the morning and at bedtime. 60 tablet 6   omeprazole (PRILOSEC) 20 MG capsule Take 20 mg by mouth daily.     sitaGLIPtin (JANUVIA) 100 MG tablet Take 100 mg by mouth daily.     Sodium Sulfate-Mag Sulfate-KCl (SUTAB) [phone] MG TABS Take 1 kit by mouth as directed. 24 tablet 0   No current facility-administered medications for this visit.    Physical Exam: Vitals:   06/05/21 1609  BP: (!) 144/50  Pulse: 72  SpO2: 93%  Weight: 240 lb 3.2 oz (109 kg)  Height: _0  (1.676 m)    GEN- The patient is well appearing, alert and oriented x 3 today.   Head- normocephalic, atraumatic Eyes-  Sclera clear, conjunctiva pink Ears- hearing intact Oropharynx- clear Lungs- Clear to ausculation bilaterally, normal work of breathing Heart- Regular rate and rhythm, no murmurs, rubs or gallops, PMI not laterally displaced GI- soft, NT, ND, + BS Extremities- no clubbing, cyanosis, or edema  Wt Readings from Last 3 Encounters:  06/05/21 240 lb 3.2 oz (109 kg)  04/22/21 234 lb (106.1 kg)  02/20/21 234 lb 12.8 oz (106.5 kg)    EKG tracing ordered today is personally reviewed and shows sinus rhythm   Assessment and Plan:  Persistent atrial fibrillation Doing well post ablation She has likely had  some afib. We could consider ILR implant (see below) Chads2vasc score is 4.  Continue eliquis No changes today I am concerned that she has not been diligent with lifestyle change.  2. OSA Not compliant with therapy  3. Obesity Body mass index is 38.77 kg/m. Lifestyle modification advised I have advised regular exercise and weight loss She is limited  by back and hip pain.  4. HTN Continue lisinopril 12m daily  5. HL Continue lipitor 430mdaily  6. Chronic diastolic dysfunction She has NYHA Class II symptoms Continue lasix Check Pro BNP I think that she would benefit from Alleviate HF trial.  I will reach out to research nurse Amy TeEnid Derry The patient  would be very interested if she qualifies.  Risks, benefits and potential toxicities for medications prescribed and/or refilled reviewed with patient today.   Return to AF clinic in 4 months  Thompson Grayer MD, [facility] 06/05/2021 4:16 PM

## 2021-06-05 NOTE — Patient Instructions (Addendum)
Medication Instructions:  Your physician recommends that you continue on your current medications as directed. Please refer to the Current Medication list given to you today.  Labwork: Pro BNP  Testing/Procedures: None ordered.  Follow-Up: Your physician wants you to follow-up in: 4 months in the Afib Clinic. They will contact you to schedule.    Any Other Special Instructions Will Be Listed Below (If Applicable).  If you need a refill on your cardiac medications before your next appointment, please call your pharmacy.

## 2021-06-06 LAB — PRO B NATRIURETIC PEPTIDE: NT-Pro BNP: 109 pg/mL (ref 0–301)

## 2021-07-04 DIAGNOSIS — I1310 Hypertensive heart and chronic kidney disease without heart failure, with stage 1 through stage 4 chronic kidney disease, or unspecified chronic kidney disease: Secondary | ICD-10-CM | POA: Diagnosis not present

## 2021-07-04 DIAGNOSIS — E1122 Type 2 diabetes mellitus with diabetic chronic kidney disease: Secondary | ICD-10-CM | POA: Diagnosis not present

## 2021-07-10 ENCOUNTER — Telehealth: Payer: Self-pay | Admitting: *Deleted

## 2021-07-10 NOTE — Telephone Encounter (Signed)
   [facility] Pre-operative Risk Assessment    Patient Name: Kayla Hendrix  DOB: October 14, 1951 MRN: [id number]  HEARTCARE STAFF:  - IMPORTANT!!!!!! Under Visit Info/Reason for Call, type in Other and utilize the format Clearance MM/DD/YY or Clearance TBD. Do not use dashes or single digits. - Please review there is not already an duplicate clearance open for this procedure. - If request is for dental extraction, please clarify the # of teeth to be extracted. - If the patient is currently at the dentist's office, call Pre-Op Callback Staff (MA/nurse) to input urgent request.  - If the patient is not currently in the dentist office, please route to the Pre-Op pool.  Request for surgical clearance:  What type of surgery is being performed? 16 TEETH TO BE EXTRACTED WITH ALVEOLOPLASTY   When is this surgery scheduled? TBD  What type of clearance is required (medical clearance vs. Pharmacy clearance to hold med vs. Both)? BOTH  Are there any medications that need to be held prior to surgery and how long?  [place] name and name of physician performing surgery? DR. Perlie Gold, DDS  What is the office phone number? [phone]   7.   What is the office fax number? [phone]  8.   Anesthesia type (None, local, MAC, general) ? MODERATE SEDATION   [name] 07/10/2021, 5:16 PM  _________________________________________________________________   (provider comments below)

## 2021-07-11 NOTE — Telephone Encounter (Signed)
Pharmacy, can you please comment on how long Eliquis can be held for multiple (16) teeth extractions?  Thank you!

## 2021-07-11 NOTE — Telephone Encounter (Signed)
Patient with diagnosis of afib on Eliquis for anticoagulation.    Procedure: 16 dental extractions with alveoloplasty Date of procedure: TBD  CHA2DS2-VASc Score = 6  This indicates a 9.7% annual risk of stroke. The patient's score is based upon: CHF History: Yes HTN History: Yes Diabetes History: Yes Stroke History: No Vascular Disease History: Yes Age Score: 1 Gender Score: 1   CrCl 17m/min using adjusted body weight due to obesity Platelet count 263K  Patient does not require pre-op antibiotics for dental procedure.  Per office protocol, patient can hold Eliquis for 2 days prior to procedure.

## 2021-07-12 NOTE — Telephone Encounter (Signed)
   Primary Cardiologist: Thompson Grayer, MD  Chart reviewed as part of pre-operative protocol coverage. Given past medical history and time since last visit, based on ACC/AHA guidelines, GLORIANNA CRANNELL would be at acceptable risk for the planned procedure without further cardiovascular testing.   Patient with diagnosis of afib on Eliquis for anticoagulation.     Procedure: 16 dental extractions with alveoloplasty Date of procedure: TBD   CHA2DS2-VASc Score = 6  This indicates a 9.7% annual risk of stroke. The patient's score is based upon: CHF History: Yes HTN History: Yes Diabetes History: Yes Stroke History: No Vascular Disease History: Yes Age Score: 1 Gender Score: 1   CrCl 43m/min using adjusted body weight due to obesity Platelet count 263K   Patient does not require pre-op antibiotics for dental procedure.   Per office protocol, patient can hold Eliquis for 2 days prior to procedure.   I will route this recommendation to the requesting party via Epic fax function and remove from pre-op pool.  Please call with questions.  JJossie Ng Tresia Revolorio NP-C    07/12/2021, 8:41 AM [id number] Office [phone] [phone]

## 2021-07-16 ENCOUNTER — Other Ambulatory Visit: Payer: Self-pay | Admitting: Internal Medicine

## 2021-07-16 DIAGNOSIS — I480 Paroxysmal atrial fibrillation: Secondary | ICD-10-CM

## 2021-07-16 NOTE — Telephone Encounter (Signed)
Eliquis '5mg'$  refill request received. Patient is 70 years old, weight-109kg, Crea-0.92 on 2/9/222, Diagnosis-Afib, and last seen by Dr. Rayann Heman on 06/05/2021. Dose is appropriate based on dosing criteria. Will send in refill to requested pharmacy.

## 2021-07-17 ENCOUNTER — Ambulatory Visit (AMBULATORY_SURGERY_CENTER): Payer: BC Managed Care – PPO | Admitting: Internal Medicine

## 2021-07-17 ENCOUNTER — Encounter: Payer: Self-pay | Admitting: Internal Medicine

## 2021-07-17 ENCOUNTER — Other Ambulatory Visit: Payer: Self-pay

## 2021-07-17 VITALS — BP 118/58 | HR 79 | Temp 98.0°F | Resp 15 | Ht 64.0 in | Wt 234.0 lb

## 2021-07-17 DIAGNOSIS — K2270 Barrett's esophagus without dysplasia: Secondary | ICD-10-CM | POA: Diagnosis not present

## 2021-07-17 DIAGNOSIS — Z86010 Personal history of colonic polyps: Secondary | ICD-10-CM

## 2021-07-17 DIAGNOSIS — K317 Polyp of stomach and duodenum: Secondary | ICD-10-CM

## 2021-07-17 DIAGNOSIS — D123 Benign neoplasm of transverse colon: Secondary | ICD-10-CM | POA: Diagnosis not present

## 2021-07-17 DIAGNOSIS — K219 Gastro-esophageal reflux disease without esophagitis: Secondary | ICD-10-CM | POA: Diagnosis not present

## 2021-07-17 DIAGNOSIS — D3A098 Benign carcinoid tumors of other sites: Secondary | ICD-10-CM

## 2021-07-17 MED ORDER — SODIUM CHLORIDE 0.9 % IV SOLN: 500.0000 mL | Freq: Once | INTRAVENOUS | Status: DC

## 2021-07-17 NOTE — Progress Notes (Signed)
Sedate, gd SR, tolerated procedure well, VSS, report to RN 

## 2021-07-17 NOTE — Op Note (Signed)
[facility] Patient Name: Kayla Hendrix Procedure Date: 07/17/2021 10:57 AM MRN: [id number] Endoscopist: Jerene Bears , MD Age: 70 Referring MD:  Date of Birth: December 27, 1950 Gender: Female Account #: [id number] Procedure:                Colonoscopy Indications:              High risk colon cancer surveillance: Personal                            history of multiple (3 or more) adenomas, Last                            colonoscopy: July 2018 Medicines:                Monitored Anesthesia Care Procedure:                Pre-Anesthesia Assessment:                           - Prior to the procedure, a History and Physical                            was performed, and patient medications and                            allergies were reviewed. The patient's tolerance of                            previous anesthesia was also reviewed. The risks                            and benefits of the procedure and the sedation                            options and risks were discussed with the patient.                            All questions were answered, and informed consent                            was obtained. Prior Anticoagulants: The patient has                            taken Eliquis (apixaban), last dose was 4 days                            prior to procedure. ASA Grade Assessment: III - A                            patient with severe systemic disease. After                            reviewing the risks and benefits, the patient was  deemed in satisfactory condition to undergo the                            procedure.                           After obtaining informed consent, the colonoscope                            was passed under direct vision. Throughout the                            procedure, the patient's blood pressure, pulse, and                            oxygen saturations were monitored continuously. The                             Colonoscope was introduced through the anus and                            advanced to the cecum, identified by appendiceal                            orifice and ileocecal valve. The patient tolerated                            the procedure well. The colonoscopy was somewhat                            difficult due to significant looping. Successful                            completion of the procedure was aided by applying                            abdominal pressure. Scope In: 11:31:19 AM Scope Out: 11:54:49 AM Scope Withdrawal Time: 0 hours 14 minutes 39 seconds  Total Procedure Duration: 0 hours 23 minutes 30 seconds  Findings:                 The digital rectal exam was normal.                           A 7 mm polyp was found in the transverse colon. The                            polyp was sessile. The polyp was removed with a                            cold snare. Resection and retrieval were complete.                           The exam was otherwise without abnormality on  direct and retroflexion views. Complications:            No immediate complications. Estimated Blood Loss:     Estimated blood loss was minimal. Impression:               - One 7 mm polyp in the transverse colon, removed                            with a cold snare. Resected and retrieved.                           - The examination was otherwise normal on direct                            and retroflexion views. Recommendation:           - Patient has a contact number available for                            emergencies. The signs and symptoms of potential                            delayed complications were discussed with the                            patient. Return to normal activities tomorrow.                            Written discharge instructions were provided to the                            patient.                           - Resume previous diet.                            - Continue present medications.                           - Await pathology results.                           - Repeat colonoscopy is recommended for                            surveillance. The colonoscopy date will be                            determined after pathology results from today's                            exam become available for review.                           - Resume Eliquis (apixaban) at prior dose tomorrow.  Refer to managing physician for further adjustment                            of therapy. Jerene Bears, MD 07/17/2021 12:11:55 PM This report has been signed electronically.

## 2021-07-17 NOTE — Progress Notes (Signed)
GASTROENTEROLOGY PROCEDURE H&P NOTE   Primary Care Physician: [name], MD    Reason for Procedure:  Follow-up personal history of Barrett's esophagus as well as gastric carcinoid tumor; colon polyp surveillance  Plan:    Upper endoscopy and colonoscopy  Patient is appropriate for endoscopic procedure(s) in the ambulatory ([facility]) setting.  The nature of the procedure, as well as the risks, benefits, and alternatives were carefully and thoroughly reviewed with the patient. Ample time for discussion and questions allowed. The patient understood, was satisfied, and agreed to proceed.     HPI: Kayla Hendrix is a 70 y.o. female who presents for EGD and colonoscopy.  History as below.  She has been on Eliquis since Saturday which was 4 days ago.  She has baseline dyspnea with exertion particularly when she is having lower back pain.  She reports this is stable and not worsening.  No recent chest pain.  No abdominal pain.  Tolerated the prep though had mild nausea without vomiting.  Past Medical History:  Diagnosis Date   Allergy    Anemia    Anxiety    Atrial fibrillation (HCC)    paroxysmal only asa 81 mg, no other blood thinner, the xarelto she was on gave her HAs   Barrett's esophagus 2018   CAD (coronary artery disease)    cath 2006- nonobstructive CAD   Cataract    Depression    Diabetes mellitus without complication (HCC)    Gastric polyp    GERD (gastroesophageal reflux disease)    Glucose intolerance (impaired glucose tolerance)    Hiatal hernia    History of transesophageal echocardiography (TEE) for monitoring    TEE 3/17:  EF 55%, no RWMA, mild plaque in descending aorta, mild MR, mod BAE, normal RVF   HL (hearing loss)    HTN (hypertension)    Hyperlipidemia    Hyperplastic colon polyp    Internal hemorrhoids    Intestinal metaplasia of gastric mucosa    Obesity    OSA (obstructive sleep apnea)    noncompliant with CPAP   Sleep apnea    Tubular adenoma  of colon     Past Surgical History:  Procedure Laterality Date   ATRIAL FIBRILLATION ABLATION N/A 11/15/2020   Procedure: ATRIAL FIBRILLATION ABLATION;  Surgeon: Thompson Grayer, MD;  Location: [facility];  Service: Cardiovascular;  Laterality: N/A;   BIOPSY  10/17/2019   Procedure: BIOPSY;  Surgeon: Jerene Bears, MD;  Location: [facility];  Service: Gastroenterology;;   CARDIOVERSION N/A 01/23/2016   Procedure: CARDIOVERSION;  Surgeon: Larey Dresser, MD;  Location: [facility];  Service: Cardiovascular;  Laterality: N/A;   CARDIOVERSION N/A 12/07/2019   Procedure: CARDIOVERSION;  Surgeon: Pixie Casino, MD;  Location: [facility];  Service: Cardiovascular;  Laterality: N/A;   CARDIOVERSION N/A 08/15/2020   Procedure: CARDIOVERSION;  Surgeon: Werner Lean, MD;  Location: [facility];  Service: Cardiovascular;  Laterality: N/A;   CARDIOVERSION N/A 12/27/2020   Procedure: CARDIOVERSION;  Surgeon: Jerline Pain, MD;  Location: [facility];  Service: Cardiovascular;  Laterality: N/A;   CESAREAN SECTION     ESOPHAGOGASTRODUODENOSCOPY (EGD) WITH PROPOFOL N/A 10/17/2019   Procedure: ESOPHAGOGASTRODUODENOSCOPY (EGD) WITH PROPOFOL;  Surgeon: Jerene Bears, MD;  Location: [facility];  Service: Gastroenterology;  Laterality: N/A;   GANGLION CYST EXCISION Left 1970s   HEMOSTASIS CLIP PLACEMENT  10/17/2019   Procedure: HEMOSTASIS CLIP PLACEMENT;  Surgeon: Jerene Bears, MD;  Location: [facility];  Service: Gastroenterology;;   POLYPECTOMY  10/17/2019   Procedure: POLYPECTOMY;  Surgeon: Jerene Bears, MD;  Location: [facility];  Service: Gastroenterology;;   TEE WITHOUT CARDIOVERSION N/A 01/23/2016   Procedure: TRANSESOPHAGEAL ECHOCARDIOGRAM (TEE);  Surgeon: Larey Dresser, MD;  Location: [facility];  Service: Cardiovascular;  Laterality: N/A;   TOTAL ABDOMINAL HYSTERECTOMY     WRIST FRACTURE SURGERY  2010   wrist fracture repair with metal rod    Prior to Admission  medications   Medication Sig Start Date End Date Taking? Authorizing Provider  atorvastatin (LIPITOR) 40 MG tablet Take 40 mg by mouth at bedtime.   Yes [provider]  clonazePAM (KLONOPIN) 0.5 MG tablet Take 0.5 mg by mouth daily.   Yes [provider]  diltiazem (TIAZAC) 360 MG 24 hr capsule Take 360 mg by mouth daily.   Yes [provider]  Ferrous Sulfate (IRON) 325 (65 Fe) MG TABS Take 325 mg by mouth as needed.   Yes [provider]  furosemide (LASIX) 20 MG tablet Take 1.5 tablets by mouth daily for fluid 11/07/20  Yes Sherran Needs, NP  gabapentin (NEURONTIN) 300 MG capsule Take 300 mg by mouth 2 (two) times daily. 10/06/19  Yes [provider]  glimepiride (AMARYL) 4 MG tablet Take 4 mg by mouth daily in the afternoon. 05/20/17  Yes [provider]  glucose blood (ACCU-CHEK AVIVA PLUS) test strip CHECK BLOOD SUGAR TWICE DAILY AS DIRECTED 09/23/16  Yes [provider]  lisinopril (ZESTRIL) 10 MG tablet Take 10 mg by mouth daily. 05/28/21  Yes [provider]  metFORMIN (GLUCOPHAGE) 500 MG tablet Take 1,000 mg by mouth in the morning and at bedtime.   Yes [provider]  metoprolol tartrate (LOPRESSOR) 25 MG tablet Take 1 tablet (25 mg total) by mouth in the morning and at bedtime. 02/13/21  Yes Sherran Needs, NP  omeprazole (PRILOSEC) 20 MG capsule Take 20 mg by mouth daily.   Yes [provider]  acetaminophen (TYLENOL) 500 MG tablet Take 500 mg by mouth every 6 (six) hours as needed for mild pain or headache.    [provider]  apixaban (ELIQUIS) 5 MG TABS tablet TAKE 1 TABLET BY MOUTH TWICE A DAY 07/16/21   Allred, Jeneen Rinks, MD  OZEMPIC, 1 MG/DOSE, 4 MG/3ML SOPN Inject 1 mg into the skin once a week. 07/04/21   [provider]  sitaGLIPtin (JANUVIA) 100 MG tablet Take 100 mg by mouth daily. Patient not taking: Reported on 07/17/2021    [provider]    Current  Outpatient Medications  Medication Sig Dispense Refill   atorvastatin (LIPITOR) 40 MG tablet Take 40 mg by mouth at bedtime.     clonazePAM (KLONOPIN) 0.5 MG tablet Take 0.5 mg by mouth daily.     diltiazem (TIAZAC) 360 MG 24 hr capsule Take 360 mg by mouth daily.     Ferrous Sulfate (IRON) 325 (65 Fe) MG TABS Take 325 mg by mouth as needed.     furosemide (LASIX) 20 MG tablet Take 1.5 tablets by mouth daily for fluid 45 tablet 6   gabapentin (NEURONTIN) 300 MG capsule Take 300 mg by mouth 2 (two) times daily.     glimepiride (AMARYL) 4 MG tablet Take 4 mg by mouth daily in the afternoon.  99   glucose blood (ACCU-CHEK AVIVA PLUS) test strip CHECK BLOOD SUGAR TWICE DAILY AS DIRECTED     lisinopril (ZESTRIL) 10 MG tablet Take 10 mg  by mouth daily.     metFORMIN (GLUCOPHAGE) 500 MG tablet Take 1,000 mg by mouth in the morning and at bedtime.     metoprolol tartrate (LOPRESSOR) 25 MG tablet Take 1 tablet (25 mg total) by mouth in the morning and at bedtime. 60 tablet 6   omeprazole (PRILOSEC) 20 MG capsule Take 20 mg by mouth daily.     acetaminophen (TYLENOL) 500 MG tablet Take 500 mg by mouth every 6 (six) hours as needed for mild pain or headache.     apixaban (ELIQUIS) 5 MG TABS tablet TAKE 1 TABLET BY MOUTH TWICE A DAY 180 tablet 1   OZEMPIC, 1 MG/DOSE, 4 MG/3ML SOPN Inject 1 mg into the skin once a week.     sitaGLIPtin (JANUVIA) 100 MG tablet Take 100 mg by mouth daily. (Patient not taking: Reported on 07/17/2021)     Current Facility-Administered Medications  Medication Dose Route Frequency Provider Last Rate Last Admin   0.9 %  sodium chloride infusion  500 mL Intravenous Once Cena Bruhn, Lajuan Lines, MD        Allergies as of 07/17/2021 - Review Complete 07/17/2021  Allergen Reaction Noted   Amiodarone Diarrhea and Nausea Only 11/07/2020   Hydrocodone Itching 05/09/2014    Family History  Problem Relation Age of Onset   Cancer Father    Stomach cancer Father    Diabetes Other         noninsulin dependant DM   Hypertension Other    Stomach cancer Maternal Grandmother    Colon cancer Neg Hx    Colon polyps Neg Hx    Esophageal cancer Neg Hx    Rectal cancer Neg Hx     Social History   Socioeconomic History   Marital status: Single    Spouse name: Not on file   Number of children: 1   Years of education: Not on file   Highest education level: Not on file  Occupational History   Not on file  Tobacco Use   Smoking status: Former    Types: Cigarettes    Quit date: 2010    Years since quitting: 12.6   Smokeless tobacco: Never   Tobacco comments:    15-pack-year smoker  Vaping Use   Vaping Use: Never used  Substance and Sexual Activity   Alcohol use: No    Comment: rarely   Drug use: No   Sexual activity: Not on file  Other Topics Concern   Not on file  Social History Narrative   Pt. Lives in [place] with her son. 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    Social Determinants of Health   Financial Resource Strain: Not on file  Food Insecurity: Not on file  Transportation Needs: Not on file  Physical Activity: Not on file  Stress: Not on file  Social Connections: Not on file  Intimate Partner Violence: Not on file    Physical Exam: Vital signs in last 24 hours: '@BP'$  (!) 136/53   Pulse (!) 103   Temp 98 F (36.7 C)   Ht '5\' 4"'$  (1.626 m)   Wt 234 lb (106.1 kg)   SpO2 95%   BMI 40.17 kg/m  GEN: NAD EYE: Sclerae anicteric ENT: MMM CV: Non-tachycardic Pulm: CTA b/l GI: Soft, NT/ND NEURO:  Alert & Oriented x 3   Zenovia Jarred, MD [facility]  07/17/2021 11:14 AM

## 2021-07-17 NOTE — Patient Instructions (Signed)
Thank you for letting us take care of your healthcare needs today. Please see handouts given to you on Polyps and Barrett's Esophagus. You may resume Eliquis tomorrow.     YOU HAD AN ENDOSCOPIC PROCEDURE TODAY AT [facility]:   Refer to the procedure report that was given to you for any specific questions about what was found during the examination.  If the procedure report does not answer your questions, please call your gastroenterologist to clarify.  If you requested that your care partner not be given the details of your procedure findings, then the procedure report has been included in a sealed envelope for you to review at your convenience later.  YOU SHOULD EXPECT: Some feelings of bloating in the abdomen. Passage of more gas than usual.  Walking can help get rid of the air that was put into your GI tract during the procedure and reduce the bloating. If you had a lower endoscopy (such as a colonoscopy or flexible sigmoidoscopy) you may notice spotting of blood in your stool or on the toilet paper. If you underwent a bowel prep for your procedure, you may not have a normal bowel movement for a few days.  Please Note:  You might notice some irritation and congestion in your nose or some drainage.  This is from the oxygen used during your procedure.  There is no need for concern and it should clear up in a day or so.  SYMPTOMS TO REPORT IMMEDIATELY:  Following lower endoscopy (colonoscopy or flexible sigmoidoscopy):  Excessive amounts of blood in the stool  Significant tenderness or worsening of abdominal pains  Swelling of the abdomen that is new, acute  Fever of 100F or higher  Following upper endoscopy (EGD)  Vomiting of blood or coffee ground material  New chest pain or pain under the shoulder blades  Painful or persistently difficult swallowing  New shortness of breath  Fever of 100F or higher  Black, tarry-looking stools  For urgent or emergent issues, a  gastroenterologist can be reached at any hour by calling [phone]. Do not use MyChart messaging for urgent concerns.    DIET:  We do recommend a small meal at first, but then you may proceed to your regular diet.  Drink plenty of fluids but you should avoid alcoholic beverages for 24 hours.  ACTIVITY:  You should plan to take it easy for the rest of today and you should NOT DRIVE or use heavy machinery until tomorrow (because of the sedation medicines used during the test).    FOLLOW UP: Our staff will call the number listed on your records 48-72 hours following your procedure to check on you and address any questions or concerns that you may have regarding the information given to you following your procedure. If we do not reach you, we will leave a message.  We will attempt to reach you two times.  During this call, we will ask if you have developed any symptoms of COVID 19. If you develop any symptoms (ie: fever, flu-like symptoms, shortness of breath, cough etc.) before then, please call [phone].  If you test positive for Covid 19 in the 2 weeks post procedure, please call and report this information to [place].    If any biopsies were taken you will be contacted by phone or by letter within the next 1-3 weeks.  Please call us at [phone] if you have not heard about the biopsies in 3 weeks.    SIGNATURES/CONFIDENTIALITY:  You and/or your care partner have signed paperwork which will be entered into your electronic medical record.  These signatures attest to the fact that that the information above on your After Visit Summary has been reviewed and is understood.  Full responsibility of the confidentiality of this discharge information lies with you and/or your care-partner.

## 2021-07-17 NOTE — Progress Notes (Signed)
Called to room to assist during endoscopic procedure.  Patient ID and intended procedure confirmed with present staff. Received instructions for my participation in the procedure from the performing physician.  

## 2021-07-17 NOTE — Op Note (Signed)
[facility] Patient Name: Kayla Hendrix Procedure Date: 07/17/2021 11:08 AM MRN: [id number] Endoscopist: Jerene Bears , MD Age: 70 Referring MD:  Date of Birth: 14-Oct-1951 Gender: Female Account #: [id number] Procedure:                Upper GI endoscopy Indications:              Follow-up of Barrett's esophagus, personal history                            of gastric carcinoid tumor resected in Dec 2020,                            GERD Medicines:                Monitored Anesthesia Care Procedure:                Pre-Anesthesia Assessment:                           - Prior to the procedure, a History and Physical                            was performed, and patient medications and                            allergies were reviewed. The patient's tolerance of                            previous anesthesia was also reviewed. The risks                            and benefits of the procedure and the sedation                            options and risks were discussed with the patient.                            All questions were answered, and informed consent                            was obtained. Prior Anticoagulants: The patient has                            taken Eliquis (apixaban), last dose was 4 days                            prior to procedure. ASA Grade Assessment: III - A                            patient with severe systemic disease. After                            reviewing the risks and benefits, the patient was  deemed in satisfactory condition to undergo the                            procedure.                           After obtaining informed consent, the endoscope was                            passed under direct vision. Throughout the                            procedure, the patient's blood pressure, pulse, and                            oxygen saturations were monitored continuously. The                            GIF [id number]  [id number] was introduced through the                            mouth, and advanced to the second part of duodenum.                            The upper GI endoscopy was accomplished without                            difficulty. The patient tolerated the procedure                            well. Scope In: Scope Out: Findings:                 The esophagus and gastroesophageal junction were                            examined with white light and narrow band imaging                            (NBI) from a forward view and retroflexed position.                            There were esophageal mucosal changes consistent                            with short-segment Barrett's esophagus. These                            changes involved the mucosa at the upper extent of                            the gastric folds (40 cm from the incisors)                            extending to the Z-line (39 cm  from the incisors).                            One tongue of salmon-colored mucosa was present                            from 39 to 40 cm. The maximum longitudinal extent                            of these esophageal mucosal changes was 1 cm in                            length. Mucosa was biopsied with a cold forceps for                            histology in a targeted manner at 39 cm from the                            incisors. One specimen bottle was sent to pathology.                           A single 8 mm semi-sessile polyp with oozing was                            found on the greater curvature of the gastric body.                            The polyp was removed with a hot snare. Resection                            and retrieval were complete. To close a defect                            after polypectomy given need for anticoagulation,                            one hemostatic clip was successfully placed (MR                            conditional). There was no bleeding at the end of                             the maneuver.                           An endoclip was found on the lesser curvature of                            the gastric body. This is the endoclip placed in                            Dec 2020. There is no evidence of residual polyp at  the clip base.                           Multiple diminutive and small sessile polyps (< 5                            mm) with no bleeding were found in the cardia, in                            the gastric fundus and in the gastric body. This                            are consistent with known fundic gland polyps.                           The examined duodenum was normal. Complications:            No immediate complications. Estimated Blood Loss:     Estimated blood loss was minimal. Impression:               - Esophageal mucosal changes consistent with                            short-segment Barrett's esophagus. Biopsied.                           - A single gastric polyp. Resected and retrieved.                            Clip (MR conditional) was placed.                           - An endoclip was found in the stomach without                            residual or recurrent polyp.                           - Multiple small and benign appearing gastric                            polyps.                           - Normal examined duodenum. Recommendation:           - Patient has a contact number available for                            emergencies. The signs and symptoms of potential                            delayed complications were discussed with the                            patient. Return to normal activities tomorrow.  Written discharge instructions were provided to the                            patient.                           - Resume previous diet.                           - Continue present medications.                           - Await  pathology results.                           - Resume Eliquis (apixaban) at prior dose tomorrow.                            Refer to managing physician for further adjustment                            of therapy.                           - See the other procedure note for documentation of                            additional recommendations. Jerene Bears, MD 07/17/2021 12:08:26 PM This report has been signed electronically.

## 2021-07-17 NOTE — Progress Notes (Signed)
DT vitals IV FM

## 2021-07-19 ENCOUNTER — Telehealth: Payer: Self-pay

## 2021-07-19 NOTE — Telephone Encounter (Signed)
  Follow up Call-  Call back number 07/17/2021 08/16/2019  Post procedure Call Back phone  # [phone]  Permission to leave phone message Yes Yes  Some recent data might be hidden     Patient questions:  Do you have a fever, pain , or abdominal swelling? No. Pain Score  0 *  Have you tolerated food without any problems? Yes.    Have you been able to return to your normal activities? Yes.    Do you have any questions about your discharge instructions: Diet   No. Medications  No. Follow up visit  No.  Do you have questions or concerns about your Care? No.  Actions: * If pain score is 4 or above: No action needed, pain <4.   Have you developed a fever since your procedure? no  2.   Have you had an respiratory symptoms (SOB or cough) since your procedure? no  3.   Have you tested positive for COVID 19 since your procedure no  4.   Have you had any family members/close contacts diagnosed with the COVID 19 since your procedure?  no   If yes to any of these questions please route to Joylene John, RN and Joella Prince, RN

## 2021-07-25 ENCOUNTER — Encounter: Payer: Self-pay | Admitting: Internal Medicine

## 2021-08-05 ENCOUNTER — Telehealth: Payer: Self-pay | Admitting: Internal Medicine

## 2021-08-05 NOTE — Telephone Encounter (Signed)
Spoke with pt and she is aware of results, path letter reviewed with pt and all questions answered.

## 2021-08-05 NOTE — Telephone Encounter (Signed)
Requesting path results 

## 2021-08-14 ENCOUNTER — Ambulatory Visit (HOSPITAL_COMMUNITY)
Admission: RE | Admit: 2021-08-14 | Discharge: 2021-08-14 | Disposition: A | Discharge status: Home / Self Care | Payer: BC Managed Care – PPO | Source: Ambulatory Visit | Attending: Nurse Practitioner | Admitting: Nurse Practitioner

## 2021-08-14 ENCOUNTER — Other Ambulatory Visit: Payer: Self-pay

## 2021-08-14 ENCOUNTER — Encounter (HOSPITAL_COMMUNITY): Payer: Self-pay | Admitting: Nurse Practitioner

## 2021-08-14 VITALS — BP 130/46 | HR 91 | Ht 64.0 in | Wt 226.6 lb

## 2021-08-14 DIAGNOSIS — Z7901 Long term (current) use of anticoagulants: Secondary | ICD-10-CM | POA: Insufficient documentation

## 2021-08-14 DIAGNOSIS — Z87891 Personal history of nicotine dependence: Secondary | ICD-10-CM | POA: Insufficient documentation

## 2021-08-14 DIAGNOSIS — I4819 Other persistent atrial fibrillation: Secondary | ICD-10-CM | POA: Diagnosis not present

## 2021-08-14 DIAGNOSIS — Z885 Allergy status to narcotic agent status: Secondary | ICD-10-CM | POA: Diagnosis not present

## 2021-08-14 DIAGNOSIS — Z7984 Long term (current) use of oral hypoglycemic drugs: Secondary | ICD-10-CM | POA: Insufficient documentation

## 2021-08-14 DIAGNOSIS — D6869 Other thrombophilia: Secondary | ICD-10-CM

## 2021-08-14 DIAGNOSIS — G4733 Obstructive sleep apnea (adult) (pediatric): Secondary | ICD-10-CM | POA: Diagnosis not present

## 2021-08-14 DIAGNOSIS — I10 Essential (primary) hypertension: Secondary | ICD-10-CM | POA: Diagnosis not present

## 2021-08-14 DIAGNOSIS — Z79899 Other long term (current) drug therapy: Secondary | ICD-10-CM | POA: Insufficient documentation

## 2021-08-14 LAB — BASIC METABOLIC PANEL
Anion gap: 13 (ref 5–15)
BUN: 14 mg/dL (ref 8–23)
CO2: 24 mmol/L (ref 22–32)
Calcium: 7.9 mg/dL — ABNORMAL LOW (ref 8.9–10.3)
Chloride: 103 mmol/L (ref 98–111)
Creatinine, Ser: 1.3 mg/dL — ABNORMAL HIGH (ref 0.44–1.00)
GFR, Estimated: 45 mL/min — ABNORMAL LOW (ref >60)
Glucose, Bld: 76 mg/dL (ref 70–99)
Potassium: 4.5 mmol/L (ref 3.5–5.1)
Sodium: 140 mmol/L (ref 135–145)

## 2021-08-14 LAB — CBC
HCT: 33.8 % — ABNORMAL LOW (ref 36.0–46.0)
Hemoglobin: 9.7 g/dL — ABNORMAL LOW (ref 12.0–15.0)
MCH: 21.1 pg — ABNORMAL LOW (ref 26.0–34.0)
MCHC: 28.7 g/dL — ABNORMAL LOW (ref 30.0–36.0)
MCV: 73.6 fL — ABNORMAL LOW (ref 80.0–100.0)
Platelets: 367 10*3/uL (ref 150–400)
RBC: 4.59 MIL/uL (ref 3.87–5.11)
RDW: 19 % — ABNORMAL HIGH (ref 11.5–15.5)
WBC: 10.3 10*3/uL (ref 4.0–10.5)
nRBC: 0 % (ref 0.0–0.2)

## 2021-08-14 NOTE — Patient Instructions (Signed)
Day of cardioversion return to normal dosing of metoprolol  Cardioversion scheduled for Wednesday, October 12th  - Arrive at the [facility] and go to admitting at 1230PM  - Do not eat or drink anything after midnight the night prior to your procedure.  - Take all your morning medication (except diabetic medications) with a sip of water prior to arrival.  - You will not be able to drive home after your procedure.  - Do NOT miss any doses of your blood thinner - if you should miss a dose please notify our office immediately.  - If you feel as if you go back into normal rhythm prior to scheduled cardioversion, please notify our office immediately. If your procedure is canceled in the cardioversion suite you will be charged a cancellation fee.  Patients will be asked to: to mask in public and hand hygiene (no longer quarantine) in the 3 days prior to surgery, to report if any COVID-19-like illness or household contacts to COVID-19 to determine need for testing
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
## 2021-08-15 ENCOUNTER — Other Ambulatory Visit (HOSPITAL_COMMUNITY): Payer: Self-pay | Admitting: Nurse Practitioner

## 2021-08-21 ENCOUNTER — Ambulatory Visit (HOSPITAL_COMMUNITY)
Admission: RE | Admit: 2021-08-21 | Discharge: 2021-08-21 | Disposition: A | Discharge status: Home / Self Care | Payer: BC Managed Care – PPO | Attending: Internal Medicine | Admitting: Internal Medicine

## 2021-08-21 ENCOUNTER — Ambulatory Visit (HOSPITAL_COMMUNITY): Payer: BC Managed Care – PPO | Admitting: Anesthesiology

## 2021-08-21 ENCOUNTER — Encounter (HOSPITAL_COMMUNITY)
Admission: RE | Disposition: A | Discharge status: Home / Self Care | Payer: Self-pay | Source: Home / Self Care | Attending: Internal Medicine

## 2021-08-21 ENCOUNTER — Encounter (HOSPITAL_COMMUNITY): Payer: Self-pay | Admitting: Internal Medicine

## 2021-08-21 DIAGNOSIS — Z79899 Other long term (current) drug therapy: Secondary | ICD-10-CM | POA: Insufficient documentation

## 2021-08-21 DIAGNOSIS — Z87891 Personal history of nicotine dependence: Secondary | ICD-10-CM | POA: Insufficient documentation

## 2021-08-21 DIAGNOSIS — Z7984 Long term (current) use of oral hypoglycemic drugs: Secondary | ICD-10-CM | POA: Insufficient documentation

## 2021-08-21 DIAGNOSIS — G4733 Obstructive sleep apnea (adult) (pediatric): Secondary | ICD-10-CM | POA: Diagnosis not present

## 2021-08-21 DIAGNOSIS — I4819 Other persistent atrial fibrillation: Secondary | ICD-10-CM | POA: Insufficient documentation

## 2021-08-21 DIAGNOSIS — Z9989 Dependence on other enabling machines and devices: Secondary | ICD-10-CM | POA: Diagnosis not present

## 2021-08-21 DIAGNOSIS — E7800 Pure hypercholesterolemia, unspecified: Secondary | ICD-10-CM | POA: Diagnosis not present

## 2021-08-21 DIAGNOSIS — Z7901 Long term (current) use of anticoagulants: Secondary | ICD-10-CM | POA: Diagnosis not present

## 2021-08-21 DIAGNOSIS — Z885 Allergy status to narcotic agent status: Secondary | ICD-10-CM | POA: Diagnosis not present

## 2021-08-21 DIAGNOSIS — Z888 Allergy status to other drugs, medicaments and biological substances status: Secondary | ICD-10-CM | POA: Insufficient documentation

## 2021-08-21 DIAGNOSIS — I10 Essential (primary) hypertension: Secondary | ICD-10-CM | POA: Insufficient documentation

## 2021-08-21 DIAGNOSIS — I4891 Unspecified atrial fibrillation: Secondary | ICD-10-CM | POA: Diagnosis not present

## 2021-08-21 HISTORY — PX: CARDIOVERSION: SHX1299

## 2021-08-21 LAB — GLUCOSE, CAPILLARY: Glucose-Capillary: 79 mg/dL (ref 70–99)

## 2021-08-21 SURGERY — CARDIOVERSION
Anesthesia: General

## 2021-08-21 MED ORDER — SODIUM CHLORIDE 0.9 % IV SOLN: INTRAVENOUS | Status: DC | PRN

## 2021-08-21 MED ORDER — LIDOCAINE 2% (20 MG/ML) 5 ML SYRINGE
INTRAMUSCULAR | Status: DC | PRN
  Administered 2021-08-21: 40 mg via INTRAVENOUS

## 2021-08-21 MED ORDER — PROPOFOL 10 MG/ML IV BOLUS
INTRAVENOUS | Status: DC | PRN
  Administered 2021-08-21: 50 mg via INTRAVENOUS

## 2021-08-21 NOTE — CV Procedure (Signed)
   CARDIOVERSION NOTE  Procedure: Electrical Cardioversion Indications:  Atrial Fibrillation  Procedure Details:  Consent: Risks of procedure as well as the alternatives and risks of each were explained to the (patient/caregiver).  Consent for procedure obtained.  Time Out: Verified patient identification, verified procedure, site/side was marked, verified correct patient position, special equipment/implants available, medications/allergies/relevent history reviewed, required imaging and test results available.  Performed  Patient placed on cardiac monitor, pulse oximetry, supplemental oxygen as necessary.  Sedation given:  propofol per anesthesia Pacer pads placed anterior and posterior chest.  Cardioverted 2 time(s).  Cardioverted at 150J and 200J biphasic.  Impression: Findings: Post procedure EKG shows: NSR Complications: None Patient did tolerate procedure well.  Plan: Successful DCCV after 2 stacked shocks (150J and 200J biphasic) to NSR.  Time Spent Directly with the Patient:  30 minutes   Kayla Casino, MD, [facility], [place] Director of the [facility] Lipid Disorders &  Cardiovascular Risk Reduction Clinic Diplomate of the American Board of Clinical Lipidology Attending Cardiologist  Direct Dial: [phone]  Fax: [phone]  Website:  [email or web address] 08/21/2021, 1:05 PM

## 2021-08-21 NOTE — Anesthesia Postprocedure Evaluation (Signed)
Anesthesia Post Note  Patient: Kayla Hendrix  Procedure(s) Performed: CARDIOVERSION     Patient location during evaluation: PACU Anesthesia Type: General Level of consciousness: awake and alert Pain management: pain level controlled Vital Signs Assessment: post-procedure vital signs reviewed and stable Respiratory status: spontaneous breathing, nonlabored ventilation, respiratory function stable and patient connected to nasal cannula oxygen Cardiovascular status: blood pressure returned to baseline and stable Postop Assessment: no apparent nausea or vomiting Anesthetic complications: no   No notable events documented.  Last Vitals:  Vitals:   08/21/21 1310 08/21/21 1320  BP: (!) 109/55 (!) 113/55  Pulse: 85 80  Resp: 11 17  Temp:    SpO2: 97% 97%    Last Pain:  Vitals:   08/21/21 1325  TempSrc:   PainSc: 0-No pain                 Effie Berkshire

## 2021-08-21 NOTE — Transfer of Care (Signed)
Immediate Anesthesia Transfer of Care Note  Patient: Kayla Hendrix  Procedure(s) Performed: CARDIOVERSION  Patient Location: Endoscopy Unit  Anesthesia Type:General  Level of Consciousness: drowsy  Airway & Oxygen Therapy: Patient Spontanous Breathing  Post-op Assessment: Report given to RN and Post -op Vital signs reviewed and stable  Post vital signs: Reviewed and stable  Last Vitals:  Vitals Value Taken Time  BP    Temp    Pulse    Resp    SpO2      Last Pain:  Vitals:   08/21/21 1230  TempSrc: Temporal  PainSc: 0-No pain         Complications: No notable events documented.

## 2021-08-21 NOTE — Anesthesia Preprocedure Evaluation (Addendum)
Anesthesia Evaluation  Patient identified by MRN, date of birth, ID band Patient awake    Reviewed: Allergy & Precautions, NPO status , Patient's Chart, lab work & pertinent test results  History of Anesthesia Complications Negative for: history of anesthetic complications  Airway Mallampati: II  TM Distance: >3 FB Neck ROM: Full    Dental  (+) Missing, Poor Dentition, Dental Advisory Given   Pulmonary sleep apnea (not compliant with CPAP) , former smoker,    breath sounds clear to auscultation       Cardiovascular hypertension, Pt. on medications + CAD  + dysrhythmias Atrial Fibrillation  Rhythm:Irregular Rate:Abnormal  TTE 09/2020: EF 60-65%, mild LVH, mild LAE, mild MR    Neuro/Psych Anxiety Depression negative neurological ROS     GI/Hepatic Neg liver ROS, hiatal hernia, GERD  Medicated and Controlled,  Endo/Other  diabetes, Type 2, Oral Hypoglycemic Agents  Renal/GU negative Renal ROS  negative genitourinary   Musculoskeletal negative musculoskeletal ROS (+)   Abdominal (+) + obese,   Peds  Hematology  (+) anemia , Hgb 9.7   Anesthesia Other Findings Day of surgery medications reviewed with patient.  Reproductive/Obstetrics negative OB ROS                           Anesthesia Physical Anesthesia Plan  ASA: 3  Anesthesia Plan: General   Post-op Pain Management:    Induction: Intravenous  PONV Risk Score and Plan: 0 and Propofol infusion  Airway Management Planned: Natural Airway and Simple Face Mask  Additional Equipment: None  Intra-op Plan:   Post-operative Plan:   Informed Consent: I have reviewed the patients History and Physical, chart, labs and discussed the procedure including the risks, benefits and alternatives for the proposed anesthesia with the patient or authorized representative who has indicated his/her understanding and acceptance.       Plan  Discussed with: CRNA  Anesthesia Plan Comments:         Anesthesia Quick Evaluation

## 2021-08-21 NOTE — Anesthesia Procedure Notes (Signed)
Procedure Name: MAC Date/Time: 08/21/2021 1:05 PM Performed by: Imagene Riches, CRNA Pre-anesthesia Checklist: Patient identified, Emergency Drugs available, Suction available, Patient being monitored and Timeout performed Patient Re-evaluated:Patient Re-evaluated prior to induction Oxygen Delivery Method: Ambu bag Preoxygenation: Pre-oxygenation with 100% oxygen

## 2021-08-21 NOTE — Interval H&P Note (Signed)
History and Physical Interval Note:  08/21/2021 12:35 PM  [place]  has presented today for surgery, with the diagnosis of AFIB.  The various methods of treatment have been discussed with the patient and family. After consideration of risks, benefits and other options for treatment, the patient has consented to  Procedure(s): CARDIOVERSION (N/A) as a surgical intervention.  The patient's history has been reviewed, patient examined, no change in status, stable for surgery.  I have reviewed the patient's chart and labs.  Questions were answered to the patient's satisfaction.     Pixie Casino

## 2021-08-21 NOTE — Discharge Instructions (Signed)

## 2021-08-25 ENCOUNTER — Encounter (HOSPITAL_COMMUNITY): Payer: Self-pay | Admitting: Internal Medicine

## 2021-08-28 ENCOUNTER — Other Ambulatory Visit: Payer: Self-pay

## 2021-08-28 ENCOUNTER — Encounter (HOSPITAL_COMMUNITY): Payer: Self-pay | Admitting: Nurse Practitioner

## 2021-08-28 ENCOUNTER — Ambulatory Visit (HOSPITAL_COMMUNITY)
Admission: RE | Admit: 2021-08-28 | Discharge: 2021-08-28 | Disposition: A | Discharge status: Home / Self Care | Payer: BC Managed Care – PPO | Source: Ambulatory Visit | Attending: Nurse Practitioner | Admitting: Nurse Practitioner

## 2021-08-28 VITALS — BP 136/50 | HR 72 | Ht 64.0 in | Wt 221.0 lb

## 2021-08-28 DIAGNOSIS — Z8249 Family history of ischemic heart disease and other diseases of the circulatory system: Secondary | ICD-10-CM | POA: Diagnosis not present

## 2021-08-28 DIAGNOSIS — Z7984 Long term (current) use of oral hypoglycemic drugs: Secondary | ICD-10-CM | POA: Insufficient documentation

## 2021-08-28 DIAGNOSIS — R197 Diarrhea, unspecified: Secondary | ICD-10-CM | POA: Diagnosis not present

## 2021-08-28 DIAGNOSIS — D6869 Other thrombophilia: Secondary | ICD-10-CM | POA: Diagnosis not present

## 2021-08-28 DIAGNOSIS — I4819 Other persistent atrial fibrillation: Secondary | ICD-10-CM | POA: Diagnosis not present

## 2021-08-28 DIAGNOSIS — Z87891 Personal history of nicotine dependence: Secondary | ICD-10-CM | POA: Insufficient documentation

## 2021-08-28 DIAGNOSIS — Z7901 Long term (current) use of anticoagulants: Secondary | ICD-10-CM | POA: Diagnosis not present

## 2021-08-28 DIAGNOSIS — Z885 Allergy status to narcotic agent status: Secondary | ICD-10-CM | POA: Insufficient documentation

## 2021-08-28 DIAGNOSIS — I10 Essential (primary) hypertension: Secondary | ICD-10-CM | POA: Insufficient documentation

## 2021-08-28 MED ORDER — METOPROLOL TARTRATE 25 MG PO TABS: 25.0000 mg | ORAL_TABLET | Freq: Two times a day (BID) | ORAL | Status: DC
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
## 2021-08-29 DIAGNOSIS — Z23 Encounter for immunization: Secondary | ICD-10-CM | POA: Diagnosis not present

## 2021-08-29 DIAGNOSIS — E1122 Type 2 diabetes mellitus with diabetic chronic kidney disease: Secondary | ICD-10-CM | POA: Diagnosis not present

## 2021-09-15 ENCOUNTER — Other Ambulatory Visit (HOSPITAL_COMMUNITY): Payer: Self-pay | Admitting: Nurse Practitioner

## 2021-09-17 DIAGNOSIS — I1310 Hypertensive heart and chronic kidney disease without heart failure, with stage 1 through stage 4 chronic kidney disease, or unspecified chronic kidney disease: Secondary | ICD-10-CM | POA: Diagnosis not present

## 2021-09-17 DIAGNOSIS — E1122 Type 2 diabetes mellitus with diabetic chronic kidney disease: Secondary | ICD-10-CM | POA: Diagnosis not present

## 2021-09-30 ENCOUNTER — Other Ambulatory Visit (HOSPITAL_COMMUNITY): Payer: Self-pay

## 2021-09-30 MED ORDER — OZEMPIC (1 MG/DOSE) 4 MG/3ML ~~LOC~~ SOPN
1.0000 mg | PEN_INJECTOR | SUBCUTANEOUS | 6 refills | Status: DC
  Filled 2021-09-30 (×3): qty 3, 28d supply, fill #0
  Filled 2021-10-21: qty 3, 28d supply, fill #1
  Filled 2021-11-29: qty 3, 28d supply, fill #2

## 2021-10-08 ENCOUNTER — Encounter (HOSPITAL_COMMUNITY): Payer: Self-pay | Admitting: Nurse Practitioner

## 2021-10-08 ENCOUNTER — Ambulatory Visit (HOSPITAL_COMMUNITY)
Admission: RE | Admit: 2021-10-08 | Discharge: 2021-10-08 | Disposition: A | Discharge status: Home / Self Care | Payer: BC Managed Care – PPO | Source: Ambulatory Visit | Attending: Nurse Practitioner | Admitting: Nurse Practitioner

## 2021-10-08 VITALS — BP 116/52 | HR 99 | Ht 64.0 in | Wt 218.4 lb

## 2021-10-08 DIAGNOSIS — G4733 Obstructive sleep apnea (adult) (pediatric): Secondary | ICD-10-CM | POA: Insufficient documentation

## 2021-10-08 DIAGNOSIS — D6869 Other thrombophilia: Secondary | ICD-10-CM | POA: Diagnosis not present

## 2021-10-08 DIAGNOSIS — I10 Essential (primary) hypertension: Secondary | ICD-10-CM | POA: Diagnosis not present

## 2021-10-08 DIAGNOSIS — Z79899 Other long term (current) drug therapy: Secondary | ICD-10-CM | POA: Insufficient documentation

## 2021-10-08 DIAGNOSIS — I4819 Other persistent atrial fibrillation: Secondary | ICD-10-CM | POA: Insufficient documentation

## 2021-10-08 DIAGNOSIS — Z87891 Personal history of nicotine dependence: Secondary | ICD-10-CM | POA: Diagnosis not present

## 2021-10-08 DIAGNOSIS — Z7901 Long term (current) use of anticoagulants: Secondary | ICD-10-CM | POA: Insufficient documentation
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
## 2021-10-09 ENCOUNTER — Ambulatory Visit (INDEPENDENT_AMBULATORY_CARE_PROVIDER_SITE_OTHER): Payer: BC Managed Care – PPO | Admitting: Internal Medicine

## 2021-10-09 VITALS — BP 104/58 | HR 73 | Ht 64.0 in | Wt 217.0 lb

## 2021-10-09 DIAGNOSIS — E785 Hyperlipidemia, unspecified: Secondary | ICD-10-CM | POA: Diagnosis not present

## 2021-10-09 DIAGNOSIS — I5032 Chronic diastolic (congestive) heart failure: Secondary | ICD-10-CM

## 2021-10-09 DIAGNOSIS — I4819 Other persistent atrial fibrillation: Secondary | ICD-10-CM

## 2021-10-09 DIAGNOSIS — I10 Essential (primary) hypertension: Secondary | ICD-10-CM | POA: Diagnosis not present

## 2021-10-09 DIAGNOSIS — G4733 Obstructive sleep apnea (adult) (pediatric): Secondary | ICD-10-CM | POA: Diagnosis not present

## 2021-10-09 NOTE — Progress Notes (Signed)
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 female who presents today for routine electrophysiology followup.  She has recently struggled with recurrence of her afib.  She did not tolerate amiodarone.  She has symptoms of fatigue and decreased exercise tolerance.  Today, she denies symptoms of palpitations, chest pain, shortness of breath,  lower extremity edema, dizziness, presyncope, or syncope.  The patient is otherwise without complaint today.   Past Medical History:  Diagnosis Date   Allergy    Anemia    Anxiety    Atrial fibrillation (HCC)    paroxysmal only asa 81 mg, no other blood thinner, the xarelto she was on gave her HAs   Barrett's esophagus 2018   CAD (coronary artery disease)    cath 2006- nonobstructive CAD   Cataract    Depression    Diabetes mellitus without complication (HCC)    Gastric polyp    GERD (gastroesophageal reflux disease)    Glucose intolerance (impaired glucose tolerance)    Hiatal hernia    History of transesophageal echocardiography (TEE) for monitoring    TEE 3/17:  EF 55%, no RWMA, mild plaque in descending aorta, mild MR, mod BAE, normal RVF   HL (hearing loss)    HTN (hypertension)    Hyperlipidemia    Hyperplastic colon polyp    Internal hemorrhoids    Intestinal metaplasia of gastric mucosa    Obesity    OSA (obstructive sleep apnea)    noncompliant with CPAP   Sleep apnea    Tubular adenoma of colon    Past Surgical History:  Procedure Laterality Date   ATRIAL FIBRILLATION ABLATION N/A 11/15/2020   Procedure: ATRIAL FIBRILLATION ABLATION;  Surgeon: Thompson Grayer, MD;  Location: [place] [facility];  Service: Cardiovascular;  Laterality: N/A;   BIOPSY  10/17/2019   Procedure: BIOPSY;  Surgeon: Jerene Bears, MD;  Location: [facility];  Service: Gastroenterology;;   CARDIOVERSION N/A 01/23/2016   Procedure: CARDIOVERSION;  Surgeon: Larey Dresser, MD;  Location: [place];  Service:  Cardiovascular;  Laterality: N/A;   CARDIOVERSION N/A 12/07/2019   Procedure: CARDIOVERSION;  Surgeon: Pixie Casino, MD;  Location: [place];  Service: Cardiovascular;  Laterality: N/A;   CARDIOVERSION N/A 08/15/2020   Procedure: CARDIOVERSION;  Surgeon: Werner Lean, MD;  Location: [place];  Service: Cardiovascular;  Laterality: N/A;   CARDIOVERSION N/A 12/27/2020   Procedure: CARDIOVERSION;  Surgeon: Jerline Pain, MD;  Location: [facility]COPY;  Service: Cardiovascular;  Laterality: N/A;   CARDIOVERSION N/A 08/21/2021   Procedure: CARDIOVERSION;  Surgeon: Pixie Casino, MD;  Location: [facility]COPY;  Service: Cardiovascular;  Laterality: N/A;   CESAREAN SECTION     ESOPHAGOGASTRODUODENOSCOPY (EGD) WITH PROPOFOL N/A 10/17/2019   Procedure: ESOPHAGOGASTRODUODENOSCOPY (EGD) WITH PROPOFOL;  Surgeon: Jerene Bears, MD;  Location: [facility];  Service: Gastroenterology;  Laterality: N/A;   GANGLION CYST EXCISION Left 1970s   HEMOSTASIS CLIP PLACEMENT  10/17/2019   Procedure: HEMOSTASIS CLIP PLACEMENT;  Surgeon: Jerene Bears, MD;  Location: [facility];  Service: Gastroenterology;;   POLYPECTOMY  10/17/2019   Procedure: POLYPECTOMY;  Surgeon: Jerene Bears, MD;  Location: [facility];  Service: Gastroenterology;;   TEE WITHOUT CARDIOVERSION N/A 01/23/2016   Procedure: TRANSESOPHAGEAL ECHOCARDIOGRAM (TEE);  Surgeon: Larey Dresser, MD;  Location: [place];  Service: Cardiovascular;  Laterality: N/A;   TOTAL ABDOMINAL HYSTERECTOMY     WRIST FRACTURE SURGERY  2010  wrist fracture repair with metal rod    ROS- all systems are reviewed and negatives except as per HPI above  Current Outpatient Medications  Medication Sig Dispense Refill   acetaminophen (TYLENOL) 500 MG tablet Take 1,000-1,500 mg by mouth daily as needed for mild pain or headache.     apixaban (ELIQUIS) 5 MG TABS tablet TAKE 1 TABLET BY MOUTH TWICE A DAY 180 tablet 1   atorvastatin (LIPITOR) 40 MG  tablet Take 40 mg by mouth 2 (two) times a week.     clonazePAM (KLONOPIN) 0.5 MG tablet Take 0.5 mg by mouth See admin instructions. Take 0.5 mg daily, may take a second 0.5 mg dose as needed for anxiety     diltiazem (TIAZAC) 360 MG 24 hr capsule Take 360 mg by mouth daily.     Ferrous Sulfate (IRON) 325 (65 Fe) MG TABS Take 325 mg by mouth 2 (two) times a week.     furosemide (LASIX) 20 MG tablet TAKE 1 & 1/2 TABLETS BY MOUTH DAILY FOR FLUID 45 tablet 6   gabapentin (NEURONTIN) 300 MG capsule Take 300 mg by mouth 2 (two) times daily.     glucose blood (ACCU-CHEK AVIVA PLUS) test strip CHECK BLOOD SUGAR TWICE DAILY AS DIRECTED     ibuprofen (ADVIL) 200 MG tablet Take 400 mg by mouth daily as needed for moderate pain.     lisinopril (ZESTRIL) 10 MG tablet Take 10 mg by mouth daily.     metFORMIN (GLUCOPHAGE-XR) 500 MG 24 hr tablet Take 1,000 mg by mouth at bedtime.     metoprolol tartrate (LOPRESSOR) 25 MG tablet Take 1 tablet (25 mg total) by mouth in the morning and at bedtime. May take an extra tablet for breakthrough afib     omeprazole (PRILOSEC) 20 MG capsule Take 20 mg by mouth daily.     Semaglutide, 1 MG/DOSE, (OZEMPIC, 1 MG/DOSE,) 4 MG/3ML SOPN Inject 1 mg into the skin once a week. 3 mL 6   No current facility-administered medications for this visit.    Physical Exam: Vitals:   10/09/21 1442  BP: (!) 104/58  Pulse: 73  SpO2: 98%  Weight: 217 lb (98.4 kg)  Height: 5\' 4"  (1.626 m)    GEN- The patient is well appearing, alert and oriented x 3 today.   Head- normocephalic, atraumatic Eyes-  Sclera clear, conjunctiva pink Ears- hearing intact Oropharynx- clear Lungs- Clear to ausculation bilaterally, normal work of breathing Heart- iRRR GI- soft, NT, ND, + BS Extremities- no clubbing, cyanosis, or edema  Wt Readings from Last 3 Encounters:  10/09/21 217 lb (98.4 kg)  10/08/21 218 lb 6.4 oz (99.1 kg)  08/28/21 221 lb (100.2 kg)    EKG form yesterday is  reviewed  Assessment and Plan:  Persistent afib She has had progressive afib She has not previously tolerated amiodarone. Her qt is prolonged and is not a tikosyn candidate.  She does not have good AAD options.  Lifestyle modification has not been achieved. she is anticoagulated with eliquis . Therapeutic strategies for afib including medicine and repeat ablation were discussed in detail with the patient today. Risk, benefits, and alternatives to EP study and radiofrequency ablation for afib were also discussed in detail today. These risks include but are not limited to stroke, bleeding, vascular damage, tamponade, perforation, damage to the esophagus, lungs, and other structures, pulmonary vein stenosis, worsening renal function, and death. The patient understands these risk and wishes to proceed.  We will therefore proceed  with catheter ablation at the next available time.  Carto, ICE, anesthesia are requested for the procedure.  Will also obtain cardiac CT prior to the procedure to exclude LAA thrombus and further evaluate atrial anatomy. In my absence, I will schedule the procedure with Dr Curt Bears.  I will arrange for him to see her in the office 3-4 weeks prior to the procedure for further discussions.  2. Obesity Body mass index is 37.25 kg/m. Likely contributes to her afib refractoriness  3. OSA Not compliant with therapy Likely contributes to her afib refractoriness Likely contributing to daytime somnolence  4. HTN Stable No change required today  5. HL Continue lipitor 40mg  daily  6. Chronic diastolic dysfunction Stable No change required today  Follow-up with Dr Curt Bears as above  Thompson Grayer MD, [facility] 10/09/2021 3:09 PM

## 2021-10-09 NOTE — Patient Instructions (Addendum)
Medication Instructions:  Your physician recommends that you continue on your current medications as directed. Please refer to the Current Medication list given to you today. *If you need a refill on your cardiac medications before your next appointment, please call your pharmacy*  Lab Work: None. If you have labs (blood work) drawn today and your tests are completely normal, you will receive your results only by: [place] (if you have MyChart) OR A paper copy in the mail If you have any lab test that is abnormal or we need to change your treatment, we will call you to review the results.  Testing/Procedures: None.  Follow-Up: At [facility], you and your health needs are our priority.  As part of our continuing mission to provide you with exceptional heart care, we have created designated Provider Care Teams.  These Care Teams include your primary Cardiologist (physician) and Advanced Practice Providers (APPs -  Physician Assistants and Nurse Practitioners) who all work together to provide you with the care you need, when you need it.  Your physician wants you to follow-up in: Will call you to set up appointment and ablation date with Dr. Curt Bears.   We recommend signing up for the patient portal called "MyChart".  Sign up information is provided on this After Visit Summary.  MyChart is used to connect with patients for Virtual Visits (Telemedicine).  Patients are able to view lab/test results, encounter notes, upcoming appointments, etc.  Non-urgent messages can be sent to your provider as well.   To learn more about what you can do with MyChart, go to [email or web address].    Any Other Special Instructions Will Be Listed Below (If Applicable).  Okay to proceed with dental procedures. Hold Eliquis for 1-2 days prior if needed.

## 2021-10-18 ENCOUNTER — Telehealth: Payer: Self-pay | Admitting: *Deleted

## 2021-10-18 DIAGNOSIS — I4819 Other persistent atrial fibrillation: Secondary | ICD-10-CM

## 2021-10-18 DIAGNOSIS — Z01818 Encounter for other preprocedural examination: Secondary | ICD-10-CM

## 2021-10-18 NOTE — Telephone Encounter (Signed)
Patient returned your call.

## 2021-10-18 NOTE — Telephone Encounter (Signed)
Left message to call back  

## 2021-10-18 NOTE — Telephone Encounter (Signed)
Left message to call back.  Set up ablation and office visit with Dr. Curt Bears.

## 2021-10-21 ENCOUNTER — Other Ambulatory Visit (HOSPITAL_COMMUNITY): Payer: Self-pay

## 2021-10-21 NOTE — Telephone Encounter (Signed)
Left message to call back  

## 2021-10-21 NOTE — Telephone Encounter (Signed)
Patient returned call, she said to call her back tomorrow after 4pm. She will be out of work by then.

## 2021-10-23 ENCOUNTER — Telehealth: Payer: Self-pay | Admitting: Internal Medicine

## 2021-10-23 NOTE — Telephone Encounter (Signed)
Pt is returning call to Saddie Benders or Triage regarding setting up an appt for an Ablation with Dr. Curt Bears. Pt is not allowed to have her phone out at work but she said she will try to wait for a return call  Pt can be reached at [phone]

## 2021-10-24 NOTE — Telephone Encounter (Signed)
See other phone note

## 2021-10-25 NOTE — Telephone Encounter (Signed)
Called patient back and left voice mail that I would call back next week.   Dr. Curt Bears schedule has changed and February 9 is no longer available for ablations. Canceled lab and office visit made and will reschedule after speaking to the patient.

## 2021-10-25 NOTE — Telephone Encounter (Signed)
Spoke to the patient about ablation date, office visit and pre op labs with Dr. Curt Bears.  Verbalized understanding and agreement.

## 2021-10-28 NOTE — Telephone Encounter (Signed)
Picked new ablation date, lab date and office visit date. On office visit day will get instruction for CT and ablation.  Patient verbalized understanding and agreement.

## 2021-11-06 ENCOUNTER — Other Ambulatory Visit: Payer: Self-pay | Admitting: Internal Medicine

## 2021-11-06 DIAGNOSIS — I480 Paroxysmal atrial fibrillation: Secondary | ICD-10-CM

## 2021-11-07 ENCOUNTER — Encounter: Payer: Self-pay | Admitting: *Deleted

## 2021-11-07 NOTE — Addendum Note (Signed)
Addended by: Darrell Jewel on: 11/07/2021 08:45 AM   Modules accepted: Orders

## 2021-11-07 NOTE — Telephone Encounter (Signed)
Prescription refill request for Eliquis received. Indication: Afib  Last office visit:10/09/21 ([facility])  Scr: 1.30 (08/14/21) Age: 70 Weight: 98.4kg  Appropriate dose and refill sent to requested pharmacy.

## 2021-11-26 ENCOUNTER — Ambulatory Visit: Payer: BC Managed Care – PPO | Admitting: Cardiology

## 2021-11-26 ENCOUNTER — Encounter: Payer: Self-pay | Admitting: Cardiology

## 2021-11-26 ENCOUNTER — Other Ambulatory Visit: Payer: Self-pay

## 2021-11-26 VITALS — BP 106/64 | HR 131 | Ht 66.0 in | Wt 217.0 lb

## 2021-11-26 DIAGNOSIS — I4819 Other persistent atrial fibrillation: Secondary | ICD-10-CM

## 2021-11-26 DIAGNOSIS — G4733 Obstructive sleep apnea (adult) (pediatric): Secondary | ICD-10-CM

## 2021-11-26 NOTE — Patient Instructions (Addendum)
Medication Instructions:  Your physician recommends that you continue on your current medications as directed. Please refer to the Current Medication list given to you today.  *If you need a refill on your cardiac medications before your next appointment, please call your pharmacy*   Lab Work: None ordered If you have labs (blood work) drawn today and your tests are completely normal, you will receive your results only by: [place] (if you have MyChart) OR A paper copy in the mail If you have any lab test that is abnormal or we need to change your treatment, we will call you to review the results.   Testing/Procedures: None ordered   Follow-Up: At [facility], you and your health needs are our priority.  As part of our continuing mission to provide you with exceptional heart care, we have created designated Provider Care Teams.  These Care Teams include your primary Cardiologist (physician) and Advanced Practice Providers (APPs -  Physician Assistants and Nurse Practitioners) who all work together to provide you with the care you need, when you need it.  We recommend signing up for the patient portal called "MyChart".  Sign up information is provided on this After Visit Summary.  MyChart is used to connect with patients for Virtual Visits (Telemedicine).  Patients are able to view lab/test results, encounter notes, upcoming appointments, etc.  Non-urgent messages can be sent to your provider as well.   To learn more about what you can do with MyChart, go to [email or web address].    Your next appointment:   Keep you current scheduled post ablation follow up   The format for your next appointment:   In Person  Provider:   Allegra Lai, MD   You have been referred to Dr. Radford Pax to establish care for your sleep apnea.   Thank you for choosing [facility]!!   Trinidad Curet, RN [phone]   Other Instructions

## 2021-11-26 NOTE — Progress Notes (Signed)
Electrophysiology Office Note   Date:  11/26/2021   ID:  Kayla, Hendrix [birth year], MRN [id number]  PCP:  [name], MD  Cardiologist:   Primary Electrophysiologist:  Tyquavious Gamel Meredith Leeds, MD    Chief Complaint: AF   History of Present Illness: Kayla Hendrix is a 71 y.o. female who is being seen today for the evaluation of AF at the request of Wile, Jesse Sans, MD. Presenting today for electrophysiology evaluation.  She has a history significant for nonobstructive coronary artery disease, atrial fibrillation, hypertension, hyperlipidemia, obstructive sleep apnea.  She is status post ablation for atrial fibrillation 11/15/2020 with PVI and posterior wall isolation.  She did not tolerate amiodarone.  Her symptoms are fatigue and decreased exercise tolerance.  Today, she denies symptoms of chest pain, orthopnea, PND, lower extremity edema, claudication, dizziness, presyncope, syncope, bleeding, or neurologic sequela. The patient is tolerating medications without difficulties.  She is tried higher doses of metoprolol, though this is caused her blood pressure to drop which makes her feel quite uncomfortable.   Past Medical History:  Diagnosis Date   Allergy    Anemia    Anxiety    Atrial fibrillation (HCC)    paroxysmal only asa 81 mg, no other blood thinner, the xarelto she was on gave her HAs   Barrett's esophagus 2018   CAD (coronary artery disease)    cath 2006- nonobstructive CAD   Cataract    Depression    Diabetes mellitus without complication (HCC)    Gastric polyp    GERD (gastroesophageal reflux disease)    Glucose intolerance (impaired glucose tolerance)    Hiatal hernia    History of transesophageal echocardiography (TEE) for monitoring    TEE 3/17:  EF 55%, no RWMA, mild plaque in descending aorta, mild MR, mod BAE, normal RVF   HL (hearing loss)    HTN (hypertension)    Hyperlipidemia    Hyperplastic colon polyp    Internal hemorrhoids    Intestinal  metaplasia of gastric mucosa    Obesity    OSA (obstructive sleep apnea)    noncompliant with CPAP   Sleep apnea    Tubular adenoma of colon    Past Surgical History:  Procedure Laterality Date   ATRIAL FIBRILLATION ABLATION N/A 11/15/2020   Procedure: ATRIAL FIBRILLATION ABLATION;  Surgeon: Thompson Grayer, MD;  Location: [facility];  Service: Cardiovascular;  Laterality: N/A;   BIOPSY  10/17/2019   Procedure: BIOPSY;  Surgeon: Jerene Bears, MD;  Location: [facility];  Service: Gastroenterology;;   CARDIOVERSION N/A 01/23/2016   Procedure: CARDIOVERSION;  Surgeon: Larey Dresser, MD;  Location: [place];  Service: Cardiovascular;  Laterality: N/A;   CARDIOVERSION N/A 12/07/2019   Procedure: CARDIOVERSION;  Surgeon: Pixie Casino, MD;  Location: [facility];  Service: Cardiovascular;  Laterality: N/A;   CARDIOVERSION N/A 08/15/2020   Procedure: CARDIOVERSION;  Surgeon: Werner Lean, MD;  Location: [place];  Service: Cardiovascular;  Laterality: N/A;   CARDIOVERSION N/A 12/27/2020   Procedure: CARDIOVERSION;  Surgeon: Jerline Pain, MD;  Location: [facility];  Service: Cardiovascular;  Laterality: N/A;   CARDIOVERSION N/A 08/21/2021   Procedure: CARDIOVERSION;  Surgeon: Pixie Casino, MD;  Location: [facility] ENDOSCOPY;  Service: Cardiovascular;  Laterality: N/A;   CESAREAN SECTION     ESOPHAGOGASTRODUODENOSCOPY (EGD) WITH PROPOFOL N/A 10/17/2019   Procedure: ESOPHAGOGASTRODUODENOSCOPY (EGD) WITH PROPOFOL;  Surgeon: Jerene Bears, MD;  Location: [facility];  Service: Gastroenterology;  Laterality:  N/A;   GANGLION CYST EXCISION Left 1970s   HEMOSTASIS CLIP PLACEMENT  10/17/2019   Procedure: HEMOSTASIS CLIP PLACEMENT;  Surgeon: Jerene Bears, MD;  Location: [facility];  Service: Gastroenterology;;   POLYPECTOMY  10/17/2019   Procedure: POLYPECTOMY;  Surgeon: Jerene Bears, MD;  Location: [facility];  Service: Gastroenterology;;   TEE WITHOUT CARDIOVERSION N/A  01/23/2016   Procedure: TRANSESOPHAGEAL ECHOCARDIOGRAM (TEE);  Surgeon: Larey Dresser, MD;  Location: [facility];  Service: Cardiovascular;  Laterality: N/A;   TOTAL ABDOMINAL HYSTERECTOMY     WRIST FRACTURE SURGERY  2010   wrist fracture repair with metal rod     Current Outpatient Medications  Medication Sig Dispense Refill   acetaminophen (TYLENOL) 500 MG tablet Take 1,000-1,500 mg by mouth daily as needed for mild pain or headache.     atorvastatin (LIPITOR) 40 MG tablet Take 40 mg by mouth 2 (two) times a week.     clonazePAM (KLONOPIN) 0.5 MG tablet Take 0.5 mg by mouth See admin instructions. Take 0.5 mg daily, may take a second 0.5 mg dose as needed for anxiety     diltiazem (TIAZAC) 360 MG 24 hr capsule Take 360 mg by mouth daily.     ELIQUIS 5 MG TABS tablet TAKE 1 TABLET BY MOUTH TWICE A DAY 180 tablet 1   Ferrous Sulfate (IRON) 325 (65 Fe) MG TABS Take 325 mg by mouth 2 (two) times a week.     furosemide (LASIX) 20 MG tablet TAKE 1 & 1/2 TABLETS BY MOUTH DAILY FOR FLUID 45 tablet 6   gabapentin (NEURONTIN) 300 MG capsule Take 300 mg by mouth 2 (two) times daily.     glucose blood (ACCU-CHEK AVIVA PLUS) test strip CHECK BLOOD SUGAR TWICE DAILY AS DIRECTED     ibuprofen (ADVIL) 200 MG tablet Take 400 mg by mouth daily as needed for moderate pain.     lisinopril (ZESTRIL) 10 MG tablet Take 10 mg by mouth daily.     metFORMIN (GLUCOPHAGE-XR) 500 MG 24 hr tablet Take 1,000 mg by mouth at bedtime.     metoprolol tartrate (LOPRESSOR) 25 MG tablet Take 1 tablet (25 mg total) by mouth in the morning and at bedtime. May take an extra tablet for breakthrough afib     omeprazole (PRILOSEC) 20 MG capsule Take 20 mg by mouth daily.     Semaglutide, 1 MG/DOSE, (OZEMPIC, 1 MG/DOSE,) 4 MG/3ML SOPN Inject 1 mg into the skin once a week. 3 mL 6   No current facility-administered medications for this visit.    Allergies:   Amiodarone and Hydrocodone   Social History:  The patient  reports  that she quit smoking about 13 years ago. Her smoking use included cigarettes. She has never used smokeless tobacco. She reports that she does not drink alcohol and does not use drugs.   Family History:  The patient's family history includes Cancer in her father; Diabetes in an other family member; Hypertension in an other family member; Stomach cancer in her father and maternal grandmother.    ROS:  Please see the history of present illness.   Otherwise, review of systems is positive for none.   All other systems are reviewed and negative.    PHYSICAL EXAM: VS:  BP 106/64    Pulse (!) 131    Ht 5\' 6"  (1.676 m)    Wt 217 lb (98.4 kg)    SpO2 93%    BMI 35.02 kg/m  , BMI Body  mass index is 35.02 kg/m. GEN: Well nourished, well developed, in no acute distress  HEENT: normal  Neck: no JVD, carotid bruits, or masses Cardiac: irregular; no murmurs, rubs, or gallops,no edema  Respiratory:  clear to auscultation bilaterally, normal work of breathing GI: soft, nontender, nondistended, + BS MS: no deformity or atrophy  Skin: warm and dry Neuro:  Strength and sensation are intact Psych: euthymic mood, full affect  EKG:  EKG is ordered today. Personal review of the ekg ordered shows atrial fibrillation, rate 131  Recent Labs: 06/05/2021: NT-Pro BNP 109 08/14/2021: BUN 14; Creatinine, Ser 1.30; Hemoglobin 9.7; Platelets 367; Potassium 4.5; Sodium 140    Lipid Panel     Component Value Date/Time   CHOL 159 04/10/2012 0117   TRIG 230 (H) 04/10/2012 0117   HDL 34 (L) 04/10/2012 0117   CHOLHDL 6 08/06/2009 1136   VLDL 46 (H) 04/10/2012 0117   LDLCALC 79 04/10/2012 0117   LDLDIRECT 117.4 08/06/2009 1136     Wt Readings from Last 3 Encounters:  11/26/21 217 lb (98.4 kg)  10/09/21 217 lb (98.4 kg)  10/08/21 218 lb 6.4 oz (99.1 kg)      Other studies Reviewed: Additional studies/ records that were reviewed today include: TTE 09/24/20  Review of the above records today demonstrates:    1. Left ventricular ejection fraction, by estimation, is 60 to 65%. The  left ventricle has normal function. The left ventricle has no regional  wall motion abnormalities. There is mild concentric left ventricular  hypertrophy. Left ventricular diastolic  function could not be evaluated.   2. Right ventricular systolic function is normal. The right ventricular  size is normal.   3. Left atrial size was mildly dilated.   4. The mitral valve is normal in structure. Mild mitral valve  regurgitation. No evidence of mitral stenosis.   5. The aortic valve is normal in structure. Aortic valve regurgitation is  not visualized. No aortic stenosis is present.   6. The inferior vena cava is normal in size with greater than 50%  respiratory variability, suggesting right atrial pressure of 3 mmHg.    ASSESSMENT AND PLAN:  1.  Persistent atrial fibrillation: Currently on metoprolol 25 mg twice daily, Eliquis 5 mg twice daily.  CHA2DS2-VASc of 4.  She is scheduled for repeat atrial fibrillation ablation 01/09/2021.  Her QTC is prolonged as she did not tolerate amiodarone.  She would thus benefit from ablation.  Risk, benefits, and alternatives to EP study and radiofrequency ablation for afib were also discussed in detail today. These risks include but are not limited to stroke, bleeding, vascular damage, tamponade, perforation, damage to the esophagus, lungs, and other structures, pulmonary vein stenosis, worsening renal function, and death. The patient understands these risk and wishes to proceed.  We Sumiko Ceasar therefore proceed with catheter ablation at the next available time.  Carto, ICE, anesthesia are requested for the procedure.  Faaris Arizpe also obtain CT PV protocol prior to the procedure to exclude LAA thrombus and further evaluate atrial anatomy.   2.  Obesity: Body mass index is 35.02 kg/m. Lifestyle modification encouraged.  3.  Obstructive sleep apnea: Noncompliant with therapy.  Likely contributes to  refractoriness of atrial fibrillation therapy.  Milderd Manocchio refer back to sleep clinic to discuss other therapy options.  4.  Hypertension: well controlled  5.  Hyperlipidemia: Continue Lipitor 40 mg daily per primary physician.  5.  Chronic diastolic heart failure: Stable.  No changes required at this time.  Current medicines are reviewed at length with the patient today.   The patient does not have concerns regarding her medicines.  The following changes were made today:  none  Labs/ tests ordered today include:  Orders Placed This Encounter  Procedures   Ambulatory referral to Cardiology   EKG 12-Lead     Disposition:   FU with Arienne Gartin 3 months  Signed, Demesha Boorman Meredith Leeds, MD  11/26/2021 8:54 AM     [address] [place]  [id number] [phone] (office) [phone] (fax)

## 2021-11-29 ENCOUNTER — Ambulatory Visit: Payer: BC Managed Care – PPO | Admitting: Cardiology

## 2021-11-29 ENCOUNTER — Other Ambulatory Visit (HOSPITAL_COMMUNITY): Payer: Self-pay

## 2021-11-29 ENCOUNTER — Other Ambulatory Visit: Payer: BC Managed Care – PPO

## 2021-12-04 DIAGNOSIS — E1122 Type 2 diabetes mellitus with diabetic chronic kidney disease: Secondary | ICD-10-CM | POA: Diagnosis not present

## 2021-12-16 ENCOUNTER — Telehealth: Payer: Self-pay | Admitting: Cardiology

## 2021-12-16 NOTE — Telephone Encounter (Signed)
Patient called and wanted to talk with Dr. Curt Bears or nurse in regards to an ablation that the patient is having. Want to know if she could switch with another patient on the same day due to transportation because of her son traveling to take her. Please call to discuss.

## 2021-12-16 NOTE — Telephone Encounter (Signed)
Patient would like to know if she can have her ablation done later in the morning. She states that she is currently scheduled to be there at 5:30am for the procedure but is having trouble with transportation. She was wondering if she could switch times with another patient. I advised her that she should keep with her current time for now as I am unable to assist with rescheduling and Dr. Curt Bears nurse is out of the office. She is going to work on getting her transportation sorted out. Advised patient that I would let Sherri know.

## 2021-12-20 ENCOUNTER — Other Ambulatory Visit: Payer: Self-pay

## 2021-12-20 ENCOUNTER — Other Ambulatory Visit: Payer: BC Managed Care – PPO | Admitting: *Deleted

## 2021-12-20 DIAGNOSIS — I4819 Other persistent atrial fibrillation: Secondary | ICD-10-CM | POA: Diagnosis not present

## 2021-12-20 DIAGNOSIS — Z01818 Encounter for other preprocedural examination: Secondary | ICD-10-CM | POA: Diagnosis not present

## 2021-12-20 NOTE — Telephone Encounter (Signed)
Left message to call back  

## 2021-12-21 LAB — BASIC METABOLIC PANEL
BUN/Creatinine Ratio: 15 (ref 12–28)
BUN: 20 mg/dL (ref 8–27)
CO2: 26 mmol/L (ref 20–29)
Calcium: 9.1 mg/dL (ref 8.7–10.3)
Chloride: 99 mmol/L (ref 96–106)
Creatinine, Ser: 1.33 mg/dL — ABNORMAL HIGH (ref 0.57–1.00)
Glucose: 67 mg/dL — ABNORMAL LOW (ref 70–99)
Potassium: 4.4 mmol/L (ref 3.5–5.2)
Sodium: 141 mmol/L (ref 134–144)
eGFR: 43 mL/min/{1.73_m2} — ABNORMAL LOW (ref >59)

## 2021-12-21 LAB — CBC WITH DIFFERENTIAL/PLATELET
Basophils Absolute: 0.1 10*3/uL (ref 0.0–0.2)
Basos: 1 %
EOS (ABSOLUTE): 0.8 10*3/uL — ABNORMAL HIGH (ref 0.0–0.4)
Eos: 9 %
Hematocrit: 31.6 % — ABNORMAL LOW (ref 34.0–46.6)
Hemoglobin: 10 g/dL — ABNORMAL LOW (ref 11.1–15.9)
Immature Grans (Abs): 0 10*3/uL (ref 0.0–0.1)
Immature Granulocytes: 0 %
Lymphocytes Absolute: 3.3 10*3/uL — ABNORMAL HIGH (ref 0.7–3.1)
Lymphs: 36 %
MCH: 22.5 pg — ABNORMAL LOW (ref 26.6–33.0)
MCHC: 31.6 g/dL (ref 31.5–35.7)
MCV: 71 fL — ABNORMAL LOW (ref 79–97)
Monocytes Absolute: 0.6 10*3/uL (ref 0.1–0.9)
Monocytes: 7 %
Neutrophils Absolute: 4.2 10*3/uL (ref 1.4–7.0)
Neutrophils: 47 %
Platelets: 323 10*3/uL (ref 150–450)
RBC: 4.45 x10E6/uL (ref 3.77–5.28)
RDW: 17.5 % — ABNORMAL HIGH (ref 11.7–15.4)
WBC: 9.1 10*3/uL (ref 3.4–10.8)

## 2021-12-23 ENCOUNTER — Other Ambulatory Visit: Payer: Self-pay | Admitting: *Deleted

## 2021-12-23 DIAGNOSIS — G4733 Obstructive sleep apnea (adult) (pediatric): Secondary | ICD-10-CM

## 2021-12-23 NOTE — Telephone Encounter (Signed)
Patient is returning RN's call. Requesting her procedure be scheduled for a later time that morning and she be called back with the information. Requesting a VM or mychart message be left if she is unable to answer. Not really able to be on her phone while at work.

## 2021-12-23 NOTE — Telephone Encounter (Signed)
Pt aware will make her second case and let her know once completed. Patient verbalized understanding and agreeable to plan.

## 2021-12-26 NOTE — Telephone Encounter (Signed)
Patient returned call

## 2021-12-26 NOTE — Telephone Encounter (Signed)
Left message to call back  

## 2021-12-31 ENCOUNTER — Telehealth (HOSPITAL_COMMUNITY): Payer: Self-pay | Admitting: Emergency Medicine

## 2021-12-31 NOTE — Telephone Encounter (Signed)
Attempted to call patient regarding upcoming cardiac CT appointment. °Left message on voicemail with name and callback number °Montague Corella RN Navigator Cardiac Imaging °[facility] °[phone] Office °[phone] Cell ° °

## 2022-01-01 ENCOUNTER — Other Ambulatory Visit: Payer: Self-pay

## 2022-01-01 ENCOUNTER — Encounter (HOSPITAL_COMMUNITY): Payer: Self-pay

## 2022-01-01 ENCOUNTER — Other Ambulatory Visit (HOSPITAL_COMMUNITY): Payer: Self-pay | Admitting: Nurse Practitioner

## 2022-01-01 ENCOUNTER — Ambulatory Visit (HOSPITAL_COMMUNITY)
Admission: RE | Admit: 2022-01-01 | Discharge: 2022-01-01 | Disposition: A | Discharge status: Home / Self Care | Payer: BC Managed Care – PPO | Source: Ambulatory Visit | Attending: Cardiology | Admitting: Cardiology

## 2022-01-01 DIAGNOSIS — I4819 Other persistent atrial fibrillation: Secondary | ICD-10-CM | POA: Diagnosis not present

## 2022-01-01 MED ORDER — METOPROLOL TARTRATE 5 MG/5ML IV SOLN: 5.0000 mg | INTRAVENOUS | Status: DC | PRN

## 2022-01-01 MED ORDER — METOPROLOL TARTRATE 5 MG/5ML IV SOLN
INTRAVENOUS | Status: AC
  Administered 2022-01-01: 10 mg via INTRAVENOUS
  Filled 2022-01-01: qty 10

## 2022-01-01 NOTE — Telephone Encounter (Signed)
Left message to call back  

## 2022-01-01 NOTE — Progress Notes (Signed)
HR remains in the 130 range even after 10 mg IV metoprolol given. Latest BP after metoprolol dose given is 101/70 without any change in heart rate. Clarise Cruz CT navigator notified. Procedure aborted. IV removed and pt discharged. No s/s of distress at this time.

## 2022-01-02 ENCOUNTER — Telehealth: Payer: Self-pay | Admitting: Cardiology

## 2022-01-02 NOTE — Telephone Encounter (Signed)
Patient states she is returning nurse Sherri's call.

## 2022-01-02 NOTE — Telephone Encounter (Signed)
Error

## 2022-01-02 NOTE — Telephone Encounter (Signed)
Pt aware that we will arrange for her to have a TEE on the table prior to the ablation next week.  (She cannot get off work another day to have it done prior)  Advised to arrive at 8:30 am morning of her procedure next week. Patient verbalized understanding and agreeable to plan.

## 2022-01-07 NOTE — Telephone Encounter (Signed)
Spoke to Trish at hospital who will arrange TEE on the table the morning of the ablation

## 2022-01-08 NOTE — Pre-Procedure Instructions (Signed)
Attemtped to call patient.  Left voice mail on the following items: ?Arrival time 0830 ?Nothing to eat or drink after midnight ?No meds AM of procedure ?Responsible person to drive you home and stay with you for 24 hrs ? ?Have you missed any doses of anti-coagulant Eliquis- take both doses today, none in the morning ? ?   ?

## 2022-01-09 ENCOUNTER — Ambulatory Visit (HOSPITAL_COMMUNITY): Payer: BC Managed Care – PPO | Admitting: Anesthesiology

## 2022-01-09 ENCOUNTER — Ambulatory Visit (HOSPITAL_COMMUNITY)
Admission: RE | Admit: 2022-01-09 | Discharge: 2022-01-09 | Disposition: A | Discharge status: Home / Self Care | Payer: BC Managed Care – PPO | Attending: Cardiology | Admitting: Cardiology

## 2022-01-09 ENCOUNTER — Encounter (HOSPITAL_COMMUNITY): Payer: Self-pay | Admitting: Cardiology

## 2022-01-09 ENCOUNTER — Other Ambulatory Visit: Payer: Self-pay

## 2022-01-09 ENCOUNTER — Encounter (HOSPITAL_COMMUNITY)
Admission: RE | Disposition: A | Discharge status: Home / Self Care | Payer: BC Managed Care – PPO | Source: Home / Self Care | Attending: Cardiology

## 2022-01-09 ENCOUNTER — Ambulatory Visit (HOSPITAL_COMMUNITY)
Admission: RE | Admit: 2022-01-09 | Discharge: 2022-01-09 | Disposition: A | Discharge status: Home / Self Care | Payer: BC Managed Care – PPO | Source: Ambulatory Visit | Attending: Cardiology | Admitting: Cardiology

## 2022-01-09 DIAGNOSIS — I081 Rheumatic disorders of both mitral and tricuspid valves: Secondary | ICD-10-CM | POA: Diagnosis not present

## 2022-01-09 DIAGNOSIS — I10 Essential (primary) hypertension: Secondary | ICD-10-CM | POA: Diagnosis not present

## 2022-01-09 DIAGNOSIS — I4891 Unspecified atrial fibrillation: Secondary | ICD-10-CM | POA: Diagnosis present

## 2022-01-09 DIAGNOSIS — I2510 Atherosclerotic heart disease of native coronary artery without angina pectoris: Secondary | ICD-10-CM | POA: Insufficient documentation

## 2022-01-09 DIAGNOSIS — E785 Hyperlipidemia, unspecified: Secondary | ICD-10-CM | POA: Insufficient documentation

## 2022-01-09 DIAGNOSIS — G4733 Obstructive sleep apnea (adult) (pediatric): Secondary | ICD-10-CM | POA: Diagnosis not present

## 2022-01-09 DIAGNOSIS — I4819 Other persistent atrial fibrillation: Secondary | ICD-10-CM | POA: Diagnosis not present

## 2022-01-09 DIAGNOSIS — E119 Type 2 diabetes mellitus without complications: Secondary | ICD-10-CM | POA: Insufficient documentation

## 2022-01-09 DIAGNOSIS — Z87891 Personal history of nicotine dependence: Secondary | ICD-10-CM | POA: Insufficient documentation

## 2022-01-09 HISTORY — PX: ATRIAL FIBRILLATION ABLATION: EP1191

## 2022-01-09 HISTORY — PX: TEE WITHOUT CARDIOVERSION: SHX5443

## 2022-01-09 LAB — GLUCOSE, CAPILLARY
Glucose-Capillary: 96 mg/dL (ref 70–99)
Glucose-Capillary: 110 mg/dL — ABNORMAL HIGH (ref 70–99)

## 2022-01-09 LAB — POCT ACTIVATED CLOTTING TIME: Activated Clotting Time: 269 seconds

## 2022-01-09 LAB — NO BLOOD PRODUCTS

## 2022-01-09 SURGERY — ATRIAL FIBRILLATION ABLATION
Anesthesia: General

## 2022-01-09 MED ORDER — SODIUM CHLORIDE 0.9% FLUSH: 3.0000 mL | INTRAVENOUS | Status: DC | PRN

## 2022-01-09 MED ORDER — HEPARIN (PORCINE) IN NACL 1000-0.9 UT/500ML-% IV SOLN
INTRAVENOUS | Status: DC | PRN
  Administered 2022-01-09 (×5): 500 mL

## 2022-01-09 MED ORDER — ROCURONIUM BROMIDE 10 MG/ML (PF) SYRINGE
PREFILLED_SYRINGE | INTRAVENOUS | Status: DC | PRN
  Administered 2022-01-09: 60 mg via INTRAVENOUS

## 2022-01-09 MED ORDER — FENTANYL CITRATE (PF) 250 MCG/5ML IJ SOLN
INTRAMUSCULAR | Status: DC | PRN
  Administered 2022-01-09: 100 ug via INTRAVENOUS

## 2022-01-09 MED ORDER — DOBUTAMINE INFUSION FOR EP/ECHO/NUC (1000 MCG/ML)
INTRAVENOUS | Status: DC | PRN
  Administered 2022-01-09: 20 ug/kg/min via INTRAVENOUS

## 2022-01-09 MED ORDER — ACETAMINOPHEN 325 MG PO TABS
650.0000 mg | ORAL_TABLET | ORAL | Status: DC | PRN
  Filled 2022-01-09: qty 2

## 2022-01-09 MED ORDER — DOBUTAMINE INFUSION FOR EP/ECHO/NUC (1000 MCG/ML)
INTRAVENOUS | Status: AC
  Filled 2022-01-09: qty 250

## 2022-01-09 MED ORDER — HEPARIN SODIUM (PORCINE) 1000 UNIT/ML IJ SOLN
INTRAMUSCULAR | Status: AC
  Filled 2022-01-09: qty 10

## 2022-01-09 MED ORDER — ONDANSETRON HCL 4 MG/2ML IJ SOLN: 4.0000 mg | Freq: Four times a day (QID) | INTRAMUSCULAR | Status: DC | PRN

## 2022-01-09 MED ORDER — ESMOLOL HCL 100 MG/10ML IV SOLN
INTRAVENOUS | Status: DC | PRN
  Administered 2022-01-09: 30 mg via INTRAVENOUS

## 2022-01-09 MED ORDER — SUGAMMADEX SODIUM 200 MG/2ML IV SOLN
INTRAVENOUS | Status: DC | PRN
  Administered 2022-01-09: 200 mg via INTRAVENOUS

## 2022-01-09 MED ORDER — DEXAMETHASONE SODIUM PHOSPHATE 10 MG/ML IJ SOLN
INTRAMUSCULAR | Status: DC | PRN
Start: 2022-01-09 — End: 2022-01-09
  Administered 2022-01-09: 5 mg via INTRAVENOUS

## 2022-01-09 MED ORDER — ONDANSETRON HCL 4 MG/2ML IJ SOLN
INTRAMUSCULAR | Status: DC | PRN
  Administered 2022-01-09: 4 mg via INTRAVENOUS

## 2022-01-09 MED ORDER — HEPARIN (PORCINE) IN NACL 1000-0.9 UT/500ML-% IV SOLN
INTRAVENOUS | Status: AC
  Filled 2022-01-09: qty 2500

## 2022-01-09 MED ORDER — PROPOFOL 10 MG/ML IV BOLUS
INTRAVENOUS | Status: DC | PRN
  Administered 2022-01-09: 140 mg via INTRAVENOUS

## 2022-01-09 MED ORDER — HEPARIN SODIUM (PORCINE) 1000 UNIT/ML IJ SOLN
INTRAMUSCULAR | Status: DC | PRN
  Administered 2022-01-09: 14000 [IU] via INTRAVENOUS
  Administered 2022-01-09: 6000 [IU] via INTRAVENOUS

## 2022-01-09 MED ORDER — SODIUM CHLORIDE 0.9% FLUSH: 3.0000 mL | Freq: Two times a day (BID) | INTRAVENOUS | Status: DC

## 2022-01-09 MED ORDER — SODIUM CHLORIDE 0.9 % IV SOLN: 250.0000 mL | INTRAVENOUS | Status: DC | PRN

## 2022-01-09 MED ORDER — SODIUM CHLORIDE 0.9 % IV SOLN: INTRAVENOUS | Status: DC

## 2022-01-09 MED ORDER — PROTAMINE SULFATE 10 MG/ML IV SOLN
INTRAVENOUS | Status: DC | PRN
  Administered 2022-01-09: 20 mg via INTRAVENOUS
  Administered 2022-01-09 (×2): 10 mg via INTRAVENOUS

## 2022-01-09 MED ORDER — HEPARIN SODIUM (PORCINE) 1000 UNIT/ML IJ SOLN
INTRAMUSCULAR | Status: DC | PRN
  Administered 2022-01-09: 1000 [IU] via INTRAVENOUS

## 2022-01-09 MED ORDER — PHENYLEPHRINE HCL-NACL 20-0.9 MG/250ML-% IV SOLN
INTRAVENOUS | Status: DC | PRN
  Administered 2022-01-09: 25 ug/min via INTRAVENOUS

## 2022-01-09 MED ORDER — PHENYLEPHRINE 40 MCG/ML (10ML) SYRINGE FOR IV PUSH (FOR BLOOD PRESSURE SUPPORT)
PREFILLED_SYRINGE | INTRAVENOUS | Status: DC | PRN
Start: 2022-01-09 — End: 2022-01-09
  Administered 2022-01-09 (×3): 80 ug via INTRAVENOUS

## 2022-01-09 MED ORDER — LIDOCAINE 2% (20 MG/ML) 5 ML SYRINGE
INTRAMUSCULAR | Status: DC | PRN
Start: 2022-01-09 — End: 2022-01-09
  Administered 2022-01-09: 60 mg via INTRAVENOUS

## 2022-01-09 MED ORDER — MIDAZOLAM HCL 5 MG/5ML IJ SOLN
INTRAMUSCULAR | Status: DC | PRN
Start: 2022-01-09 — End: 2022-01-09
  Administered 2022-01-09: 2 mg via INTRAVENOUS

## 2022-01-09 SURGICAL SUPPLY — 19 items
BAG SNAP BAND KOVER 36X36 (MISCELLANEOUS) ×1 IMPLANT
CATH OCTARAY 2.0 F 3-3-3-3-3 (CATHETERS) ×1 IMPLANT
CATH S CIRCA THERM PROBE 10F (CATHETERS) ×1 IMPLANT
CATH SMTCH THERMOCOOL SF DF (CATHETERS) ×1 IMPLANT
CATH SOUNDSTAR ECO 8FR (CATHETERS) ×1 IMPLANT
CATH WEBSTER BI DIR CS D-F CRV (CATHETERS) ×1 IMPLANT
CLOSURE PERCLOSE PROSTYLE (VASCULAR PRODUCTS) ×5 IMPLANT
COVER SWIFTLINK CONNECTOR (BAG) ×3 IMPLANT
KIT VERSACROSS STEERABLE D1 (CATHETERS) ×1 IMPLANT
PACK EP LATEX FREE (CUSTOM PROCEDURE TRAY) ×2
PACK EP LF (CUSTOM PROCEDURE TRAY) ×2 IMPLANT
PAD DEFIB RADIO PHYSIO CONN (PAD) ×3 IMPLANT
PATCH CARTO3 (PAD) ×1 IMPLANT
SHEATH CARTO VIZIGO SM CVD (SHEATH) ×1 IMPLANT
SHEATH PINNACLE 7F 10CM (SHEATH) ×1 IMPLANT
SHEATH PINNACLE 8F 10CM (SHEATH) ×2 IMPLANT
SHEATH PINNACLE 9F 10CM (SHEATH) ×1 IMPLANT
SHEATH PROBE COVER 6X72 (BAG) ×1 IMPLANT
TUBING SMART ABLATE COOLFLOW (TUBING) ×1 IMPLANT

## 2022-01-09 NOTE — Anesthesia Postprocedure Evaluation (Signed)
Anesthesia Post Note ? ?Patient: Kayla Hendrix ? ?Procedure(s) Performed: ATRIAL FIBRILLATION ABLATION ?TRANSESOPHAGEAL ECHOCARDIOGRAM (TEE) ? ?  ? ?Patient location during evaluation: PACU ?Anesthesia Type: General ?Level of consciousness: awake and alert ?Pain management: pain level controlled ?Vital Signs Assessment: post-procedure vital signs reviewed and stable ?Respiratory status: spontaneous breathing, nonlabored ventilation, respiratory function stable and patient connected to nasal cannula oxygen ?Cardiovascular status: blood pressure returned to baseline and stable ?Postop Assessment: no apparent nausea or vomiting ?Anesthetic complications: no ? ? ?No notable events documented. ? ?Last Vitals:  ?Vitals:  ? 01/09/22 1400 01/09/22 1415  ?BP: (!) 124/52 (!) 128/55  ?Pulse: 91 89  ?Resp: 14 12  ?Temp:    ?SpO2: 92% 96%  ?  ?Last Pain:  ?Vitals:  ? 01/09/22 1330  ?TempSrc:   ?PainSc: 0-No pain  ? ? ?  ?  ?  ?  ?  ?  ? ?Kayla Hendrix ? ? ? ? ?

## 2022-01-09 NOTE — Anesthesia Procedure Notes (Signed)
Procedure Name: Intubation ?Date/Time: 01/09/2022 10:40 AM ?Performed by: Jenne Campus, CRNA ?Pre-anesthesia Checklist: Patient identified, Emergency Drugs available, Suction available and Patient being monitored ?Patient Re-evaluated:Patient Re-evaluated prior to induction ?Oxygen Delivery Method: Circle System Utilized ?Preoxygenation: Pre-oxygenation with 100% oxygen ?Induction Type: IV induction ?Ventilation: Mask ventilation without difficulty ?Laryngoscope Size: Sabra Heck and 2 ?Grade View: Grade I ?Tube type: Oral ?Tube size: 7.0 mm ?Number of attempts: 1 ?Airway Equipment and Method: Stylet and Oral airway ?Placement Confirmation: ETT inserted through vocal cords under direct vision, positive ETCO2 and breath sounds checked- equal and bilateral ?Secured at: 21 cm ?Tube secured with: Tape ?Dental Injury: Teeth and Oropharynx as per pre-operative assessment  ? ? ? ? ?

## 2022-01-09 NOTE — H&P (Signed)
? ?Electrophysiology Office Note ? ? ?Date:  01/09/2022  ? ?ID:  Kayla Hendrix, DOB 12-20-1950, MRN [id number] ? ?PCP:  [name], MD  ?Cardiologist:   ?Primary Electrophysiologist:  Adell Panek Meredith Leeds, MD   ? ?Chief Complaint: AF ?  ?History of Present Illness: ?Kayla Hendrix is a 71 y.o. female who is being seen today for the evaluation of AF at the request of No ref. provider found. Presenting today for electrophysiology evaluation. ? ?She has a history significant for nonobstructive coronary artery disease, atrial fibrillation, hypertension, hyperlipidemia, obstructive sleep apnea.  She is status post ablation for atrial fibrillation 11/15/2020 with PVI and posterior wall isolation.  She did not tolerate amiodarone.  Her symptoms are fatigue and decreased exercise tolerance. ? ?Today, denies symptoms of palpitations, chest pain, shortness of breath, orthopnea, PND, lower extremity edema, claudication, dizziness, presyncope, syncope, bleeding, or neurologic sequela. The patient is tolerating medications without difficulties. Plan ablation today.  ? ? ?Past Medical History:  ?Diagnosis Date  ? Allergy   ? Anemia   ? Anxiety   ? Atrial fibrillation ([place])   ? paroxysmal only asa 81 mg, no other blood thinner, the xarelto she was on gave her HAs  ? Barrett's esophagus 2018  ? CAD (coronary artery disease)   ? cath 2006- nonobstructive CAD  ? Cataract   ? Depression   ? Diabetes mellitus without complication ([place])   ? Gastric polyp   ? GERD (gastroesophageal reflux disease)   ? Glucose intolerance (impaired glucose tolerance)   ? Hiatal hernia   ? History of transesophageal echocardiography (TEE) for monitoring   ? TEE 3/17:  EF 55%, no RWMA, mild plaque in descending aorta, mild MR, mod BAE, normal RVF  ? HL (hearing loss)   ? HTN (hypertension)   ? Hyperlipidemia   ? Hyperplastic colon polyp   ? Internal hemorrhoids   ? Intestinal metaplasia of gastric mucosa   ? Obesity   ? OSA (obstructive sleep apnea)   ?  noncompliant with CPAP  ? Sleep apnea   ? Tubular adenoma of colon   ? ?Past Surgical History:  ?Procedure Laterality Date  ? ATRIAL FIBRILLATION ABLATION N/A 11/15/2020  ? Procedure: ATRIAL FIBRILLATION ABLATION;  Surgeon: Thompson Grayer, MD;  Location: [place] [facility];  Service: Cardiovascular;  Laterality: N/A;  ? BIOPSY  10/17/2019  ? Procedure: BIOPSY;  Surgeon: Jerene Bears, MD;  Location: [facility];  Service: Gastroenterology;;  ? CARDIOVERSION N/A 01/23/2016  ? Procedure: CARDIOVERSION;  Surgeon: Larey Dresser, MD;  Location: [place];  Service: Cardiovascular;  Laterality: N/A;  ? CARDIOVERSION N/A 12/07/2019  ? Procedure: CARDIOVERSION;  Surgeon: Pixie Casino, MD;  Location: [facility] ENDOSCOPY;  Service: Cardiovascular;  Laterality: N/A;  ? CARDIOVERSION N/A 08/15/2020  ? Procedure: CARDIOVERSION;  Surgeon: Werner Lean, MD;  Location: [place];  Service: Cardiovascular;  Laterality: N/A;  ? CARDIOVERSION N/A 12/27/2020  ? Procedure: CARDIOVERSION;  Surgeon: Jerline Pain, MD;  Location: [facility] ENDOSCOPY;  Service: Cardiovascular;  Laterality: N/A;  ? CARDIOVERSION N/A 08/21/2021  ? Procedure: CARDIOVERSION;  Surgeon: Pixie Casino, MD;  Location: [facility];  Service: Cardiovascular;  Laterality: N/A;  ? CESAREAN SECTION    ? ESOPHAGOGASTRODUODENOSCOPY (EGD) WITH PROPOFOL N/A 10/17/2019  ? Procedure: ESOPHAGOGASTRODUODENOSCOPY (EGD) WITH PROPOFOL;  Surgeon: Jerene Bears, MD;  Location: [facility] ENDOSCOPY;  Service: Gastroenterology;  Laterality: N/A;  ? GANGLION CYST EXCISION Left 1970s  ? HEMOSTASIS CLIP PLACEMENT  10/17/2019  ?  Procedure: HEMOSTASIS CLIP PLACEMENT;  Surgeon: Jerene Bears, MD;  Location: [facility];  Service: Gastroenterology;;  ? POLYPECTOMY  10/17/2019  ? Procedure: POLYPECTOMY;  Surgeon: Jerene Bears, MD;  Location: [facility];  Service: Gastroenterology;;  ? TEE WITHOUT CARDIOVERSION N/A 01/23/2016  ? Procedure: TRANSESOPHAGEAL ECHOCARDIOGRAM (TEE);  Surgeon: Larey Dresser, MD;  Location: [place];  Service: Cardiovascular;  Laterality: N/A;  ? TOTAL ABDOMINAL HYSTERECTOMY    ? WRIST FRACTURE SURGERY  2010  ? wrist fracture repair with metal rod  ? ? ? ?Current Facility-Administered Medications  ?Medication Dose Route Frequency Provider Last Rate Last Admin  ? 0.9 %  sodium chloride infusion   Intravenous Continuous Allred, Jeneen Rinks, MD      ? ? ?Allergies:   Amiodarone and Hydrocodone  ? ?Social History:  The patient  reports that she quit smoking about 13 years ago. Her smoking use included cigarettes. She has never used smokeless tobacco. She reports that she does not drink alcohol and does not use drugs.  ? ?Family History:  The patient's family history includes Cancer in her father; Diabetes in an other family member; Hypertension in an other family member; Stomach cancer in her father and maternal grandmother.  ? ?ROS:  Please see the history of present illness.   Otherwise, review of systems is positive for none.   All other systems are reviewed and negative.  ? ?PHYSICAL EXAM: ?VS:  BP 107/65   Pulse (!) 144   Temp 98 ?F (36.7 ?C) (Oral)   Ht 5\' 6"  (1.676 m)   Wt 95.3 kg   SpO2 99%   BMI 33.89 kg/m?  , BMI Body mass index is 33.89 kg/m?. ?GEN: Well nourished, well developed, in no acute distress  ?HEENT: normal  ?Neck: no JVD, carotid bruits, or masses ?Cardiac: irregular; no murmurs, rubs, or gallops,no edema  ?Respiratory:  clear to auscultation bilaterally, normal work of breathing ?GI: soft, nontender, nondistended, + BS ?MS: no deformity or atrophy  ?Skin: warm and dry ?Neuro:  Strength and sensation are intact ?Psych: euthymic mood, full affect ? ?Recent Labs: ?06/05/2021: NT-Pro BNP 109 ?12/20/2021: BUN 20; Creatinine, Ser 1.33; Hemoglobin 10.0; Platelets 323; Potassium 4.4; Sodium 141  ? ? ?Lipid Panel  ?   ?Component Value Date/Time  ? CHOL 159 04/10/2012 0117  ? TRIG 230 (H) 04/10/2012 0117  ? HDL 34 (L) 04/10/2012 0117  ? CHOLHDL 6 08/06/2009 1136  ?  VLDL 46 (H) 04/10/2012 0117  ? Davison 79 04/10/2012 0117  ? LDLDIRECT 117.4 08/06/2009 1136  ? ? ? ?Wt Readings from Last 3 Encounters:  ?01/09/22 95.3 kg  ?11/26/21 98.4 kg  ?10/09/21 98.4 kg  ?  ? ? ?Other studies Reviewed: ?Additional studies/ records that were reviewed today include: TTE 09/24/20  ?Review of the above records today demonstrates:  ? 1. Left ventricular ejection fraction, by estimation, is 60 to 65%. The  ?left ventricle has normal function. The left ventricle has no regional  ?wall motion abnormalities. There is mild concentric left ventricular  ?hypertrophy. Left ventricular diastolic  ?function could not be evaluated.  ? 2. Right ventricular systolic function is normal. The right ventricular  ?size is normal.  ? 3. Left atrial size was mildly dilated.  ? 4. The mitral valve is normal in structure. Mild mitral valve  ?regurgitation. No evidence of mitral stenosis.  ? 5. The aortic valve is normal in structure. Aortic valve regurgitation is  ?not visualized. No aortic stenosis is present.  ?  6. The inferior vena cava is normal in size with greater than 50%  ?respiratory variability, suggesting right atrial pressure of 3 mmHg.  ? ? ?ASSESSMENT AND PLAN: ? ?1.  Persistent atrial fibrillation: Kayla Hendrix has presented today for surgery, with the diagnosis of atrial fibrillation.  The various methods of treatment have been discussed with the patient and family. After consideration of risks, benefits and other options for treatment, the patient has consented to  Procedure(s): ?Catheter ablation as a surgical intervention .  Risks include but not limited to complete heart block, stroke, esophageal damage, nerve damage, bleeding, vascular damage, tamponade, perforation, MI, and death. The patient's history has been reviewed, patient examined, no change in status, stable for surgery.  I have reviewed the patient's chart and labs.  Questions were answered to the patient's satisfaction.   ? ?Allegra Lai, MD ?01/09/2022 ?9:36 AM  ? ?

## 2022-01-09 NOTE — CV Procedure (Signed)
? ?  Transesophageal Echocardiogram ? ?Indications: Persistent atrial fibrillation, preablation ? ?Time out performed ? ?During this procedure the patient was administered propofol under anesthesiology supervision to achieve and maintain moderate sedation.  The patient's heart rate, blood pressure, and oxygen saturation are monitored continuously during the procedure.  Patient was intubated sedate.  Probe was placed. ? ?Findings: ? ?Left Ventricle: Normal ejection fraction 60 to 65% ? ?Mitral Valve: Trace mitral regurgitation ? ?Aortic Valve: Trileaflet valve, no regurgitation no stenosis ? ?Tricuspid Valve: Mild tricuspid regurgitation ? ?Left Atrium: Normal, no left atrial appendage thrombus ? ?Right Atrium: Normal ? ?Intraatrial septum: Normal ? ?Bubble Contrast Study: Not performed ? ?Impression: Normal ejection fraction.  No left atrial appendage thrombus.  Okay to proceed with ablation.  Discussed with Dr. Curt Bears. ? ?Candee Furbish, MD ?  ?

## 2022-01-09 NOTE — Discharge Instructions (Addendum)
Post procedure care instructions ?No driving for 4 days. No lifting over 5 lbs for 1 week. No vigorous or sexual activity for 1 week. You may return to work/your usual activities on 01/17/22. Keep procedure site clean & dry. If you notice increased pain, swelling, bleeding or pus, call/return!  You may shower after 24 hours, but no soaking in baths/hot tubs/pools for 1 week.  ? ? ?You have an appointment set up with the [facility].  Multiple studies have shown that being followed by a dedicated atrial fibrillation clinic in addition to the standard care you receive from your other physicians improves health. We believe that enrollment in the atrial fibrillation clinic will allow [place] to better care for you.  ? ?The phone number to the [facility] is [phone]. The clinic is staffed Monday through Friday from 8:30am to 5pm. ? ?Parking Directions: The clinic is located in the Heart and Vascular Building connected to [facility]. ?1)From [address] turn on to [address] and go to the 3rd entrance  (Heart and Vascular entrance) on the right. ?2)Look to the right for Heart &Vascular Parking Garage. ?3)A code for the entrance is required, for march is 1102.   ?4)Take the elevators to the 1st floor. Registration is in the room with the glass walls at the end of the hallway. ? ?If you have any trouble parking or locating the clinic, please don?t hesitate to call [phone].  ? ?Cardiac Ablation, Care After ? ?This sheet gives you information about how to care for yourself after your procedure. Your health care provider may also give you more specific instructions. If you have problems or questions, contact your health care provider. ?What can I expect after the procedure? ?After the procedure, it is common to have: ?Bruising around your puncture site. ?Tenderness around your puncture site. ?Skipped heartbeats. ?Tiredness (fatigue). ? ?Follow these instructions at  home: ?Puncture site care  ?Follow instructions from your health care provider about how to take care of your puncture site. Make sure you: ?If present, leave stitches (sutures), skin glue, or adhesive strips in place. These skin closures may need to stay in place for up to 2 weeks. If adhesive strip edges start to loosen and curl up, you may trim the loose edges. Do not remove adhesive strips completely unless your health care provider tells you to do that. ?If a large square bandage is present, this may be removed 24 hours after surgery.  ?Check your puncture site every day for signs of infection. Check for: ?Redness, swelling, or pain. ?Fluid or blood. If your puncture site starts to bleed, lie down on your back, apply firm pressure to the area, and contact your health care provider. ?Warmth. ?Pus or a bad smell. ?Driving ?Do not drive for at least 4 days after your procedure or however long your health care provider recommends. (Do not resume driving if you have previously been instructed not to drive for other health reasons.) ?Do not drive or use heavy machinery while taking prescription pain medicine. ?Activity ?Avoid activities that take a lot of effort for at least 7 days after your procedure. ?Do not lift anything that is heavier than 5 lb (4.5 kg) for one week.  ?No sexual activity for 1 week.  ?Return to your normal activities as told by your health care provider. Ask your health care provider what activities are safe for you. ?General instructions ?Take over-the-counter and prescription medicines only as told by your health care  provider. ?Do not use any products that contain nicotine or tobacco, such as cigarettes and e-cigarettes. If you need help quitting, ask your health care provider. ?You may shower after 24 hours, but Do not take baths, swim, or use a hot tub for 1 week.  ?Do not drink alcohol for 24 hours after your procedure. ?Keep all follow-up visits as told by your health care provider. This  is important. ?Contact a health care provider if: ?You have redness, mild swelling, or pain around your puncture site. ?You have fluid or blood coming from your puncture site that stops after applying firm pressure to the area. ?Your puncture site feels warm to the touch. ?You have pus or a bad smell coming from your puncture site. ?You have a fever. ?You have chest pain or discomfort that spreads to your neck, jaw, or arm. ?You are sweating a lot. ?You feel nauseous. ?You have a fast or irregular heartbeat. ?You have shortness of breath. ?You are dizzy or light-headed and feel the need to lie down. ?You have pain or numbness in the arm or leg closest to your puncture site. ?Get help right away if: ?Your puncture site suddenly swells. ?Your puncture site is bleeding and the bleeding does not stop after applying firm pressure to the area. ?These symptoms may represent a serious problem that is an emergency. Do not wait to see if the symptoms will go away. Get medical help right away. Call your local emergency services (911 in the [place] Do not drive yourself to the hospital. ?Summary ?After the procedure, it is normal to have bruising and tenderness at the puncture site in your groin, neck, or forearm. ?Check your puncture site every day for signs of infection. ?Get help right away if your puncture site is bleeding and the bleeding does not stop after applying firm pressure to the area. This is a medical emergency. ?This information is not intended to replace advice given to you by your health care provider. Make sure you discuss any questions you have with your health care provider. ?  ?

## 2022-01-09 NOTE — Anesthesia Preprocedure Evaluation (Addendum)
Anesthesia Evaluation  ?Patient identified by MRN, date of birth, ID band ?Patient awake ? ? ? ?Reviewed: ?Allergy & Precautions, NPO status , Patient's Chart, lab work & pertinent test results ? ?Airway ?Mallampati: II ? ?TM Distance: >3 FB ?Neck ROM: Full ? ? ? Dental ? ?(+) Partial Lower, Upper Dentures, Dental Advisory Given ?  ?Pulmonary ?sleep apnea , former smoker,  ?  ?Pulmonary exam normal ? ? ? ? ? ? ? Cardiovascular ?hypertension, Pt. on medications ?+ CAD  ?+ dysrhythmias Atrial Fibrillation  ?Rhythm:Irregular Rate:Normal ? ? ?  ?Neuro/Psych ?Anxiety Depression negative neurological ROS ?   ? GI/Hepatic ?Neg liver ROS, hiatal hernia, GERD  Medicated,  ?Endo/Other  ?diabetes, Type 2, Oral Hypoglycemic Agents ? Renal/GU ?negative Renal ROS  ?negative genitourinary ?  ?Musculoskeletal ?negative musculoskeletal ROS ?(+)  ? Abdominal ?Normal abdominal exam  (+)   ?Peds ? Hematology ? ?(+) Blood dyscrasia, anemia ,   ?Anesthesia Other Findings ? ? Reproductive/Obstetrics ? ?  ? ? ? ? ? ? ? ? ? ? ? ? ? ?  ?  ? ? ? ? ? ? ?Anesthesia Physical ?Anesthesia Plan ? ?ASA: 3 ? ?Anesthesia Plan: General  ? ?Post-op Pain Management:   ? ?Induction: Intravenous ? ?PONV Risk Score and Plan: 3 and Ondansetron, Dexamethasone and Treatment may vary due to age or medical condition ? ?Airway Management Planned: Mask and Oral ETT ? ?Additional Equipment: None ? ?Intra-op Plan:  ? ?Post-operative Plan: Extubation in OR ? ?Informed Consent: I have reviewed the patients History and Physical, chart, labs and discussed the procedure including the risks, benefits and alternatives for the proposed anesthesia with the patient or authorized representative who has indicated his/her understanding and acceptance.  ? ? ? ?Dental advisory given ? ?Plan Discussed with: CRNA ? ?Anesthesia Plan Comments: (Lab Results ?     Component                Value               Date                 ?     WBC                       9.1                 12/20/2021           ?     HGB                      10.0 (L)            12/20/2021           ?     HCT                      31.6 (L)            12/20/2021           ?     MCV                      71 (L)              12/20/2021           ?     PLT  323                 12/20/2021           ?Lab Results ?     Component                Value               Date                 ?     NA                       141                 12/20/2021           ?     K                        4.4                 12/20/2021           ?     CO2                      26                  12/20/2021           ?     GLUCOSE                  67 (L)              12/20/2021           ?     BUN                      20                  12/20/2021           ?     CREATININE               1.33 (H)            12/20/2021           ?     CALCIUM                  9.1                 12/20/2021           ?     EGFR                     43 (L)              12/20/2021           ?     GFRNONAA                 45 (L)              08/14/2021          )  ? ? ? ? ? ? ?Anesthesia Quick Evaluation ? ?

## 2022-01-09 NOTE — Progress Notes (Signed)
Ambulated patient post bed rest to restroom. Patient alerted writer she was bleeding from her right groin. MD was present at time. Writer laid patient flat and applied pressure. Per MD to hold pressure for 10 minutes and redress area. MD to come assess patient prior to discharge. Will continue to monitor.  ?

## 2022-01-09 NOTE — Transfer of Care (Signed)
Immediate Anesthesia Transfer of Care Note ? ?Patient: Kayla Hendrix ? ?Procedure(s) Performed: ATRIAL FIBRILLATION ABLATION ?TRANSESOPHAGEAL ECHOCARDIOGRAM (TEE) ? ?Patient Location: Cath Lab ? ?Anesthesia Type:General ? ?Level of Consciousness: oriented, drowsy and patient cooperative ? ?Airway & Oxygen Therapy: Patient Spontanous Breathing and Patient connected to nasal cannula oxygen ? ?Post-op Assessment: Report given to RN and Post -op Vital signs reviewed and stable ? ?Post vital signs: Reviewed ? ?Last Vitals:  ?Vitals Value Taken Time  ?BP 123/52 01/09/22 1246  ?Temp    ?Pulse 90 01/09/22 1250  ?Resp 17 01/09/22 1250  ?SpO2 100 % 01/09/22 1250  ?Vitals shown include unvalidated device data. ? ?Last Pain:  ?Vitals:  ? 01/09/22 0930  ?TempSrc:   ?PainSc: 0-No pain  ?   ? ?  ? ?Complications: No notable events documented. ?

## 2022-01-10 ENCOUNTER — Other Ambulatory Visit: Payer: Self-pay | Admitting: Internal Medicine

## 2022-01-10 ENCOUNTER — Encounter (HOSPITAL_COMMUNITY): Payer: Self-pay | Admitting: Cardiology

## 2022-01-10 DIAGNOSIS — I480 Paroxysmal atrial fibrillation: Secondary | ICD-10-CM

## 2022-01-10 NOTE — Telephone Encounter (Signed)
Prescription refill request for Eliquis received. ?Indication: afib  ?Last office visit: 11/26/2021, [place] ?Scr: 1.33,  12/20/2021 ?Age: 71 yo  ?Weight: 95.3 kg  ? ?Refill sent.  ?

## 2022-01-23 ENCOUNTER — Telehealth: Payer: Self-pay | Admitting: *Deleted

## 2022-01-23 DIAGNOSIS — G4733 Obstructive sleep apnea (adult) (pediatric): Secondary | ICD-10-CM

## 2022-01-23 NOTE — Telephone Encounter (Addendum)
SPLIT NIGHT DENIED ?APPROVED HOME STUDY ?Order ID: [id number] ?Approval Valid Through:02/07/2022 - 04/07/2022 ?Maybe duplicate ?HST APPROVED Valid dates 01-23-22-   03-23-22  ?Order number [id number] ?

## 2022-02-06 ENCOUNTER — Encounter (HOSPITAL_COMMUNITY): Payer: Self-pay | Admitting: Nurse Practitioner

## 2022-02-06 ENCOUNTER — Ambulatory Visit (HOSPITAL_COMMUNITY)
Admission: RE | Admit: 2022-02-06 | Discharge: 2022-02-06 | Disposition: A | Discharge status: Home / Self Care | Payer: BC Managed Care – PPO | Source: Ambulatory Visit | Attending: Nurse Practitioner | Admitting: Nurse Practitioner

## 2022-02-06 ENCOUNTER — Ambulatory Visit (HOSPITAL_COMMUNITY): Payer: BC Managed Care – PPO | Admitting: Nurse Practitioner

## 2022-02-06 VITALS — BP 122/50 | HR 57 | Ht 66.0 in | Wt 206.6 lb

## 2022-02-06 DIAGNOSIS — I4819 Other persistent atrial fibrillation: Secondary | ICD-10-CM | POA: Diagnosis not present

## 2022-02-06 DIAGNOSIS — D6869 Other thrombophilia: Secondary | ICD-10-CM

## 2022-02-06 DIAGNOSIS — Z79899 Other long term (current) drug therapy: Secondary | ICD-10-CM | POA: Diagnosis not present

## 2022-02-06 DIAGNOSIS — I10 Essential (primary) hypertension: Secondary | ICD-10-CM | POA: Insufficient documentation

## 2022-02-06 DIAGNOSIS — Z7901 Long term (current) use of anticoagulants: Secondary | ICD-10-CM | POA: Diagnosis not present

## 2022-02-06 DIAGNOSIS — G4733 Obstructive sleep apnea (adult) (pediatric): Secondary | ICD-10-CM | POA: Diagnosis not present

## 2022-02-06 MED ORDER — METOPROLOL SUCCINATE ER 25 MG PO TB24: 12.5000 mg | ORAL_TABLET | Freq: Every day | ORAL | 1 refills | Status: DC
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
## 2022-02-07 NOTE — Telephone Encounter (Signed)
This will need to be sent to Dr. Curt Bears nurse as to confirm ok to proceed with Itamar, as well as RN will need to place the order for the Itamar. Once I have an order I will proceed on my end and call the pt to come in the office for set up.  ?

## 2022-02-10 NOTE — Telephone Encounter (Signed)
I s/w the pt about her coming in for Itamar sleep study set up. While speaking with the pt I did ask if she access to bluetooth, pt answered no she does not. This is required for the Itamar study. I assured the pt that I will let Dr. Curt Bears know that we will not be able to proceed with the Itamar study. I assured the pt if anything different we will let her know. Pt thanked me for the call and the trying to help.  ?

## 2022-02-10 NOTE — Telephone Encounter (Signed)
I will forward this to Gershon Cull, CMA to see notes.  ?

## 2022-02-17 ENCOUNTER — Telehealth: Payer: Self-pay | Admitting: Cardiology

## 2022-02-17 NOTE — Telephone Encounter (Signed)
Left message

## 2022-02-17 NOTE — Telephone Encounter (Signed)
Patient c/o Palpitations:  High priority if patient c/o lightheadedness, shortness of breath, or chest pain ? ?How long have you had palpitations/irregular HR/ Afib? Are you having the symptoms now? Since this weekend; Yes  ? ?Are you currently experiencing lightheadedness, SOB or CP? No  ? ?Do you have a history of afib (atrial fibrillation) or irregular heart rhythm? Yes  ? ?Have you checked your BP or HR? (document readings if available): no  ? ?Are you experiencing any other symptoms? No   ?

## 2022-02-19 MED ORDER — METOPROLOL TARTRATE 25 MG PO TABS
12.5000 mg | ORAL_TABLET | Freq: Two times a day (BID) | ORAL | 3 refills | Status: DC
Start: 2022-02-19 — End: 2022-02-24

## 2022-02-19 NOTE — Telephone Encounter (Signed)
Pt returned call - has been in Af since Saturday HRs 80-110s. Pt decided to stay on metoprolol tartrate. She will increase metoprolol to '25mg'$  BID and hold lisinopril. If still out on Monday will come in for assessment. Pt in agreement. ?

## 2022-02-24 ENCOUNTER — Ambulatory Visit (HOSPITAL_COMMUNITY)
Admission: RE | Admit: 2022-02-24 | Discharge: 2022-02-24 | Disposition: A | Discharge status: Home / Self Care | Payer: BC Managed Care – PPO | Source: Ambulatory Visit | Attending: Physician Assistant | Admitting: Physician Assistant

## 2022-02-24 VITALS — BP 116/50 | HR 117 | Ht 66.0 in | Wt 207.0 lb

## 2022-02-24 DIAGNOSIS — G4733 Obstructive sleep apnea (adult) (pediatric): Secondary | ICD-10-CM | POA: Insufficient documentation

## 2022-02-24 DIAGNOSIS — D6869 Other thrombophilia: Secondary | ICD-10-CM

## 2022-02-24 DIAGNOSIS — Z79899 Other long term (current) drug therapy: Secondary | ICD-10-CM | POA: Diagnosis not present

## 2022-02-24 DIAGNOSIS — I10 Essential (primary) hypertension: Secondary | ICD-10-CM | POA: Diagnosis not present

## 2022-02-24 DIAGNOSIS — I4819 Other persistent atrial fibrillation: Secondary | ICD-10-CM | POA: Insufficient documentation

## 2022-02-24 DIAGNOSIS — Z7901 Long term (current) use of anticoagulants: Secondary | ICD-10-CM | POA: Diagnosis not present

## 2022-02-24 LAB — BASIC METABOLIC PANEL
Anion gap: 9 (ref 5–15)
BUN: 11 mg/dL (ref 8–23)
CO2: 28 mmol/L (ref 22–32)
Calcium: 9.1 mg/dL (ref 8.9–10.3)
Chloride: 105 mmol/L (ref 98–111)
Creatinine, Ser: 1.14 mg/dL — ABNORMAL HIGH (ref 0.44–1.00)
GFR, Estimated: 52 mL/min — ABNORMAL LOW (ref >60)
Glucose, Bld: 123 mg/dL — ABNORMAL HIGH (ref 70–99)
Potassium: 4.7 mmol/L (ref 3.5–5.1)
Sodium: 142 mmol/L (ref 135–145)

## 2022-02-24 LAB — CBC
HCT: 35.7 % — ABNORMAL LOW (ref 36.0–46.0)
Hemoglobin: 11 g/dL — ABNORMAL LOW (ref 12.0–15.0)
MCH: 23.2 pg — ABNORMAL LOW (ref 26.0–34.0)
MCHC: 30.8 g/dL (ref 30.0–36.0)
MCV: 75.3 fL — ABNORMAL LOW (ref 80.0–100.0)
Platelets: 327 10*3/uL (ref 150–400)
RBC: 4.74 MIL/uL (ref 3.87–5.11)
RDW: 16.9 % — ABNORMAL HIGH (ref 11.5–15.5)
WBC: 11 10*3/uL — ABNORMAL HIGH (ref 4.0–10.5)
nRBC: 0 % (ref 0.0–0.2)

## 2022-02-24 MED ORDER — METOPROLOL TARTRATE 25 MG PO TABS
ORAL_TABLET | ORAL | Status: DC
Start: 2022-02-24 — End: 2022-03-17
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
## 2022-02-24 NOTE — Patient Instructions (Signed)
Cardioversion scheduled for Monday. April 24th ? - Arrive at the [facility] and go to admitting at 8am ? - Do not eat or drink anything after midnight the night prior to your procedure. ? - Take all your morning medication (except diabetic medications) with a sip of water prior to arrival. ? - You will not be able to drive home after your procedure. ? - Do NOT miss any doses of your blood thinner - if you should miss a dose please notify our office immediately. ? - If you feel as if you go back into normal rhythm prior to scheduled cardioversion, please notify our office immediately. If your procedure is canceled in the cardioversion suite you will be charged a cancellation fee. ? ?

## 2022-02-24 NOTE — Addendum Note (Signed)
Encounter addended by: Juluis Mire, RN on: 02/24/2022 11:53 AM ? Actions taken: Order list changed

## 2022-02-25 DIAGNOSIS — I1310 Hypertensive heart and chronic kidney disease without heart failure, with stage 1 through stage 4 chronic kidney disease, or unspecified chronic kidney disease: Secondary | ICD-10-CM | POA: Diagnosis not present

## 2022-02-25 DIAGNOSIS — E1122 Type 2 diabetes mellitus with diabetic chronic kidney disease: Secondary | ICD-10-CM | POA: Diagnosis not present

## 2022-02-25 DIAGNOSIS — E1142 Type 2 diabetes mellitus with diabetic polyneuropathy: Secondary | ICD-10-CM | POA: Diagnosis not present

## 2022-02-27 NOTE — Telephone Encounter (Signed)
Will order Home Sleep test ?

## 2022-02-27 NOTE — Addendum Note (Signed)
Addended by: [name] on: 02/27/2022 12:28 PM ? ? Modules accepted: Orders ? ?

## 2022-03-03 ENCOUNTER — Other Ambulatory Visit: Payer: Self-pay

## 2022-03-03 ENCOUNTER — Ambulatory Visit (HOSPITAL_COMMUNITY): Payer: BC Managed Care – PPO | Admitting: Certified Registered Nurse Anesthetist

## 2022-03-03 ENCOUNTER — Encounter (HOSPITAL_COMMUNITY): Payer: Self-pay | Admitting: Cardiology

## 2022-03-03 ENCOUNTER — Encounter (HOSPITAL_COMMUNITY)
Admission: RE | Disposition: A | Discharge status: Home / Self Care | Payer: Self-pay | Source: Home / Self Care | Attending: Cardiology

## 2022-03-03 ENCOUNTER — Ambulatory Visit (HOSPITAL_COMMUNITY)
Admission: RE | Admit: 2022-03-03 | Discharge: 2022-03-03 | Disposition: A | Discharge status: Home / Self Care | Payer: BC Managed Care – PPO | Attending: Cardiology | Admitting: Cardiology

## 2022-03-03 DIAGNOSIS — Z87891 Personal history of nicotine dependence: Secondary | ICD-10-CM | POA: Insufficient documentation

## 2022-03-03 DIAGNOSIS — Z79899 Other long term (current) drug therapy: Secondary | ICD-10-CM | POA: Insufficient documentation

## 2022-03-03 DIAGNOSIS — Z7901 Long term (current) use of anticoagulants: Secondary | ICD-10-CM | POA: Insufficient documentation

## 2022-03-03 DIAGNOSIS — I4891 Unspecified atrial fibrillation: Secondary | ICD-10-CM | POA: Diagnosis not present

## 2022-03-03 DIAGNOSIS — F418 Other specified anxiety disorders: Secondary | ICD-10-CM | POA: Diagnosis not present

## 2022-03-03 DIAGNOSIS — I2510 Atherosclerotic heart disease of native coronary artery without angina pectoris: Secondary | ICD-10-CM | POA: Diagnosis not present

## 2022-03-03 DIAGNOSIS — I4819 Other persistent atrial fibrillation: Secondary | ICD-10-CM | POA: Diagnosis not present

## 2022-03-03 DIAGNOSIS — I10 Essential (primary) hypertension: Secondary | ICD-10-CM | POA: Insufficient documentation

## 2022-03-03 HISTORY — PX: CARDIOVERSION: SHX1299

## 2022-03-03 SURGERY — CARDIOVERSION
Anesthesia: General

## 2022-03-03 MED ORDER — PROPOFOL 10 MG/ML IV BOLUS
INTRAVENOUS | Status: DC | PRN
  Administered 2022-03-03: 100 mg via INTRAVENOUS

## 2022-03-03 MED ORDER — SODIUM CHLORIDE 0.9 % IV SOLN: INTRAVENOUS | Status: DC | PRN

## 2022-03-03 MED ORDER — SODIUM CHLORIDE 0.9 % IV SOLN: INTRAVENOUS | Status: DC

## 2022-03-03 MED ORDER — LIDOCAINE 2% (20 MG/ML) 5 ML SYRINGE
INTRAMUSCULAR | Status: DC | PRN
  Administered 2022-03-03: 100 mg via INTRAVENOUS

## 2022-03-03 NOTE — Procedures (Signed)
Procedure: Electrical Cardioversion ?Indications:  Atrial Fibrillation ? ?Procedure Details: ? ?Consent: Risks of procedure as well as the alternatives and risks of each were explained to the (patient/caregiver).  Consent for procedure obtained. ? ?Time Out: Verified patient identification, verified procedure, site/side was marked, verified correct patient position, special equipment/implants available, medications/allergies/relevent history reviewed, required imaging and test results available. PERFORMED. ? ?Patient placed on cardiac monitor, pulse oximetry, supplemental oxygen as necessary.  ?Sedation given:  Propofol '100mg'$ ; lidocaine '100mg'$  ?Pacer pads placed anterior and posterior chest. ? ?Cardioverted 3 time(s).  ?Cardioversion with synchronized biphasic 200J shock. ? ?Evaluation: ?Findings: Post procedure EKG shows: NSR with frequent PACs ?Complications: None ?Patient did tolerate procedure well. ? ?Time Spent Directly with the Patient: ? ?35 minutes  ? ?[name] ?03/03/2022, 9:03 AM  ?

## 2022-03-03 NOTE — Anesthesia Procedure Notes (Signed)
Procedure Name: General with mask airway ?Date/Time: 03/03/2022 8:57 AM ?Performed by: Reece Agar, CRNA ?Pre-anesthesia Checklist: Patient identified, Emergency Drugs available, Suction available, Patient being monitored and Timeout performed ?Patient Re-evaluated:Patient Re-evaluated prior to induction ?Oxygen Delivery Method: Ambu bag ?Preoxygenation: Pre-oxygenation with 100% oxygen ?Induction Type: IV induction ?Ventilation: Mask ventilation without difficulty ? ? ? ? ?

## 2022-03-03 NOTE — Discharge Instructions (Signed)
Electrical Cardioversion  Electrical cardioversion is the delivery of a jolt of electricity to restore a normal rhythm to the heart. A rhythm that is too fast or is not regular keeps the heart from pumping well. In this procedure, sticky patches or metal paddles are placed on the chest to deliver electricity to the heart from a device.  What can I expect after the procedure?  Your blood pressure, heart rate, breathing rate, and blood oxygen level will be monitored until you leave the hospital or clinic.  Your heart rhythm will be watched to make sure it does not change.  You may have some redness on the skin where the shocks were given.If this occurs, can use hydrocortisone cream or Aloe vera.  Follow these instructions at home:  Do not drive for 24 hours if you were given a sedative during your procedure.  Take over-the-counter and prescription medicines only as told by your health care provider.  Ask your health care provider how to check your pulse. Check it often.  Rest for 48 hours after the procedure or as told by your health care provider.  Avoid or limit your caffeine use as told by your health care provider.  Keep all follow-up visits as told by your health care provider. This is important.  Contact a health care provider if:  You feel like your heart is beating too quickly or your pulse is not regular.  You have a serious muscle cramp that does not go away.  Get help right away if:  You have discomfort in your chest.  You are dizzy or you feel faint.  You have trouble breathing or you are short of breath.  Your speech is slurred.  You have trouble moving an arm or leg on one side of your body.  Your fingers or toes turn cold or blue.  Summary  Electrical cardioversion is the delivery of a jolt of electricity to restore a normal rhythm to the heart.  This procedure may be done right away in an emergency or may be a scheduled procedure if the condition is not  an emergency.  Generally, this is a safe procedure.  After the procedure, check your pulse often as told by your health care provider.  This information is not intended to replace advice given to you by your health care provider. Make sure you discuss any questions you have with your health care provider. Document Revised: 05/30/2019 Document Reviewed: 05/30/2019 Elsevier Patient Education  2021 Elsevier Inc.  

## 2022-03-03 NOTE — Interval H&P Note (Signed)
History and Physical Interval Note: ? ?03/03/2022 ?8:20 AM ? ?Kayla Hendrix  has presented today for surgery, with the diagnosis of AFIB.  The various methods of treatment have been discussed with the patient and family. After consideration of risks, benefits and other options for treatment, the patient has consented to  Procedure(s): ?CARDIOVERSION (N/A) as a surgical intervention.  The patient's history has been reviewed, patient examined, no change in status, stable for surgery.  I have reviewed the patient's chart and labs.  Questions were answered to the patient's satisfaction.   ? ? ?[name] ? ? ?

## 2022-03-03 NOTE — Anesthesia Postprocedure Evaluation (Signed)
Anesthesia Post Note ? ?Patient: Kayla Hendrix ? ?Procedure(s) Performed: CARDIOVERSION ? ?  ? ?Patient location during evaluation: PACU ?Anesthesia Type: General ?Level of consciousness: awake and alert ?Pain management: pain level controlled ?Vital Signs Assessment: post-procedure vital signs reviewed and stable ?Respiratory status: spontaneous breathing, nonlabored ventilation and respiratory function stable ?Cardiovascular status: blood pressure returned to baseline and stable ?Postop Assessment: no apparent nausea or vomiting ?Anesthetic complications: no ? ? ?No notable events documented. ? ?Last Vitals:  ?Vitals:  ? 03/03/22 0912 03/03/22 0935  ?BP: (!) 108/45 (!) 103/55  ?Pulse: 72 68  ?Resp: 16 17  ?Temp: (!) 36.3 ?C   ?SpO2: 98% 98%  ?  ?Last Pain:  ?Vitals:  ? 03/03/22 0935  ?TempSrc:   ?PainSc: 0-No pain  ? ? ?  ?  ?  ?  ?  ?  ? ?Lynda Rainwater ? ? ? ? ?

## 2022-03-03 NOTE — Transfer of Care (Signed)
Immediate Anesthesia Transfer of Care Note ? ?Patient: Kayla Hendrix ? ?Procedure(s) Performed: CARDIOVERSION ? ?Patient Location: PACU ? ?Anesthesia Type:General ? ?Level of Consciousness: drowsy ? ?Airway & Oxygen Therapy: Patient Spontanous Breathing ? ?Post-op Assessment: Report given to RN and Post -op Vital signs reviewed and stable ? ?Post vital signs: Reviewed and stable ? ?Last Vitals:  ?Vitals Value Taken Time  ?BP 108/45 03/03/22 09:07  ?Temp    ?Pulse 71 03/03/22 09:07  ?Resp 16 03/03/22 09:07  ?SpO2 96 03/03/22 09:07  ? ? ?Last Pain:  ?Vitals:  ? 03/03/22 0827  ?PainSc: 0-No pain  ?   ? ?  ? ?Complications: No notable events documented. ?

## 2022-03-03 NOTE — Anesthesia Preprocedure Evaluation (Signed)
Anesthesia Evaluation  ?Patient identified by MRN, date of birth, ID band ?Patient awake ? ? ? ?Reviewed: ?Allergy & Precautions, NPO status , Patient's Chart, lab work & pertinent test results ? ?History of Anesthesia Complications ?Negative for: history of anesthetic complications ? ?Airway ?Mallampati: II ? ?TM Distance: >3 FB ?Neck ROM: Full ? ? ? Dental ? ?(+) Missing, Poor Dentition, Dental Advisory Given ?  ?Pulmonary ?neg pulmonary ROS, former smoker,  ?  ?breath sounds clear to auscultation ? ? ? ? ? ? Cardiovascular ?hypertension, Pt. on medications ?+ CAD  ?negative cardio ROS ? ?Atrial Fibrillation  ?Rhythm:Irregular Rate:Abnormal ? ?TTE 09/2020: EF 60-65%, mild LVH, mild LAE, mild MR  ?  ?Neuro/Psych ?Anxiety Depression negative neurological ROS ? negative psych ROS  ? GI/Hepatic ?negative GI ROS, Neg liver ROS, GERD  Medicated and Controlled,  ?Endo/Other  ?negative endocrine ROSdiabetes, Type 2, Oral Hypoglycemic Agents ? Renal/GU ?negative Renal ROS  ?negative genitourinary ?  ?Musculoskeletal ?negative musculoskeletal ROS ?(+)  ? Abdominal ?(+) + obese,   ?Peds ?negative pediatric ROS ?(+)  Hematology ?negative hematology ROS ?(+) Hgb 9.7   ?Anesthesia Other Findings ?Day of surgery medications reviewed with patient. ? Reproductive/Obstetrics ?negative OB ROS ? ?  ? ? ? ? ? ? ? ? ? ? ? ? ? ?  ?  ? ? ? ? ? ? ? ? ?Anesthesia Physical ? ?Anesthesia Plan ? ?ASA: 3 ? ?Anesthesia Plan: General  ? ?Post-op Pain Management:   ? ?Induction: Intravenous ? ?PONV Risk Score and Plan: 0 and Propofol infusion ? ?Airway Management Planned: Natural Airway and Simple Face Mask ? ?Additional Equipment: None ? ?Intra-op Plan:  ? ?Post-operative Plan:  ? ?Informed Consent: I have reviewed the patients History and Physical, chart, labs and discussed the procedure including the risks, benefits and alternatives for the proposed anesthesia with the patient or authorized representative who  has indicated his/her understanding and acceptance.  ? ? ? ? ? ?Plan Discussed with: CRNA ? ?Anesthesia Plan Comments:   ? ? ? ? ? ? ?Anesthesia Quick Evaluation ? ?

## 2022-03-05 ENCOUNTER — Encounter (HOSPITAL_COMMUNITY): Payer: Self-pay | Admitting: Cardiology

## 2022-03-05 NOTE — Telephone Encounter (Addendum)
DATE EXTENSION ?Order #[id number] ?Approval Valid Through:03/05/2022 - 05/03/2022. ? ?Prior Authorization for HST sent to [facility] via web portal. Order ID: [id number] ? ? ? ? ?

## 2022-03-15 ENCOUNTER — Other Ambulatory Visit (HOSPITAL_COMMUNITY): Payer: Self-pay | Admitting: Nurse Practitioner

## 2022-03-20 ENCOUNTER — Telehealth (HOSPITAL_COMMUNITY): Payer: Self-pay | Admitting: *Deleted

## 2022-03-20 NOTE — Telephone Encounter (Signed)
Patient called in stating she is back in Af post cardioversion (4/24) HRs 110. Discussed with Roderic Palau NP will increase metoprolol to '25mg'$  BID and hold lisinopril for now to give room for BP. Pt has follow up with Dr. Curt Bears in 2 weeks. Pt will call if issues arise prior to follow up.  ?

## 2022-04-08 ENCOUNTER — Ambulatory Visit (INDEPENDENT_AMBULATORY_CARE_PROVIDER_SITE_OTHER): Payer: BC Managed Care – PPO | Admitting: Cardiology

## 2022-04-08 ENCOUNTER — Encounter: Payer: Self-pay | Admitting: Cardiology

## 2022-04-08 VITALS — BP 128/70 | HR 134 | Ht 66.0 in | Wt 206.2 lb

## 2022-04-08 DIAGNOSIS — I4819 Other persistent atrial fibrillation: Secondary | ICD-10-CM

## 2022-04-08 DIAGNOSIS — D6869 Other thrombophilia: Secondary | ICD-10-CM

## 2022-04-08 MED ORDER — METOPROLOL TARTRATE 50 MG PO TABS: 50.0000 mg | ORAL_TABLET | Freq: Two times a day (BID) | ORAL | 1 refills | Status: DC

## 2022-04-08 NOTE — Progress Notes (Signed)
Electrophysiology Office Note   Date:  04/08/2022   ID:  Kayla Hendrix, Kayla Hendrix 01-15-1951, MRN [id number]  PCP:  [name], MD  Cardiologist:   Primary Electrophysiologist:  Jaquanda Wickersham Meredith Leeds, MD    Chief Complaint: AF   History of Present Illness: Kayla Hendrix is a 71 y.o. female who is being seen today for the evaluation of AF at the request of Jacalyn Lefevre, Jesse Sans, MD. Presenting today for electrophysiology evaluation.  She has a history significant for nonobstructive coronary artery disease, atrial fibrillation, hypertension, hyperlipidemia, obstructive sleep apnea.  She had PVI 11/15/2020.  She did not tolerate amiodarone.  She had initial thought of dofetilide, though she had persistent hypomagnesemia and she was not admitted for dofetilide load.    She is status post repeat atrial fibrillation ablation 01/09/2022.  Unfortunately she has had more frequent episodes of atrial fibrillation since that time.  Her heart rates are quite rapid.  She feels weak, fatigued, short of breath.  She did have a cardioversion but went back into atrial fibrillation.    Today, denies symptoms of palpitations, chest pain, orthopnea, PND, lower extremity edema, claudication, dizziness, presyncope, syncope, bleeding, or neurologic sequela. The patient is tolerating medications without difficulties.   Past Medical History:  Diagnosis Date   Allergy    Anemia    Anxiety    Atrial fibrillation (HCC)    paroxysmal only asa 81 mg, no other blood thinner, the xarelto she was on gave her HAs   Barrett's esophagus 2018   CAD (coronary artery disease)    cath 2006- nonobstructive CAD   Cataract    Depression    Diabetes mellitus without complication (HCC)    Gastric polyp    GERD (gastroesophageal reflux disease)    Glucose intolerance (impaired glucose tolerance)    Hiatal hernia    History of transesophageal echocardiography (TEE) for monitoring    TEE 3/17:  EF 55%, no RWMA, mild plaque in  descending aorta, mild MR, mod BAE, normal RVF   HL (hearing loss)    HTN (hypertension)    Hyperlipidemia    Hyperplastic colon polyp    Internal hemorrhoids    Intestinal metaplasia of gastric mucosa    Obesity    OSA (obstructive sleep apnea)    noncompliant with CPAP   Sleep apnea    Tubular adenoma of colon    Past Surgical History:  Procedure Laterality Date   ATRIAL FIBRILLATION ABLATION N/A 11/15/2020   Procedure: ATRIAL FIBRILLATION ABLATION;  Surgeon: Thompson Grayer, MD;  Location: [place] [facility];  Service: Cardiovascular;  Laterality: N/A;   ATRIAL FIBRILLATION ABLATION N/A 01/09/2022   Procedure: ATRIAL FIBRILLATION ABLATION;  Surgeon: Constance Haw, MD;  Location: [place] [facility];  Service: Cardiovascular;  Laterality: N/A;   BIOPSY  10/17/2019   Procedure: BIOPSY;  Surgeon: Jerene Bears, MD;  Location: [facility];  Service: Gastroenterology;;   CARDIOVERSION N/A 01/23/2016   Procedure: CARDIOVERSION;  Surgeon: Larey Dresser, MD;  Location: [place];  Service: Cardiovascular;  Laterality: N/A;   CARDIOVERSION N/A 12/07/2019   Procedure: CARDIOVERSION;  Surgeon: Pixie Casino, MD;  Location: [facility];  Service: Cardiovascular;  Laterality: N/A;   CARDIOVERSION N/A 08/15/2020   Procedure: CARDIOVERSION;  Surgeon: Werner Lean, MD;  Location: [place];  Service: Cardiovascular;  Laterality: N/A;   CARDIOVERSION N/A 12/27/2020   Procedure: CARDIOVERSION;  Surgeon: Jerline Pain, MD;  Location: [place];  Service: Cardiovascular;  Laterality: N/A;   CARDIOVERSION N/A 08/21/2021   Procedure: CARDIOVERSION;  Surgeon: Pixie Casino, MD;  Location: [place];  Service: Cardiovascular;  Laterality: N/A;   CARDIOVERSION N/A 03/03/2022   Procedure: CARDIOVERSION;  Surgeon: [name], MD;  Location: [facility] ENDOSCOPY;  Service: Cardiovascular;  Laterality: N/A;   CESAREAN SECTION     ESOPHAGOGASTRODUODENOSCOPY (EGD) WITH PROPOFOL  N/A 10/17/2019   Procedure: ESOPHAGOGASTRODUODENOSCOPY (EGD) WITH PROPOFOL;  Surgeon: Jerene Bears, MD;  Location: [facility];  Service: Gastroenterology;  Laterality: N/A;   GANGLION CYST EXCISION Left 1970s   HEMOSTASIS CLIP PLACEMENT  10/17/2019   Procedure: HEMOSTASIS CLIP PLACEMENT;  Surgeon: Jerene Bears, MD;  Location: [facility];  Service: Gastroenterology;;   POLYPECTOMY  10/17/2019   Procedure: POLYPECTOMY;  Surgeon: Jerene Bears, MD;  Location: [facility];  Service: Gastroenterology;;   TEE WITHOUT CARDIOVERSION N/A 01/23/2016   Procedure: TRANSESOPHAGEAL ECHOCARDIOGRAM (TEE);  Surgeon: Larey Dresser, MD;  Location: [place];  Service: Cardiovascular;  Laterality: N/A;   TEE WITHOUT CARDIOVERSION N/A 01/09/2022   Procedure: TRANSESOPHAGEAL ECHOCARDIOGRAM (TEE);  Surgeon: Constance Haw, MD;  Location: [place] [facility];  Service: Cardiovascular;  Laterality: N/A;   TOTAL ABDOMINAL HYSTERECTOMY     WRIST FRACTURE SURGERY  2010   wrist fracture repair with metal rod     Current Outpatient Medications  Medication Sig Dispense Refill   acetaminophen (TYLENOL) 500 MG tablet Take 1,000-1,500 mg by mouth daily as needed for mild pain or headache.     atorvastatin (LIPITOR) 40 MG tablet Take 40 mg by mouth 2 (two) times a week.     clonazePAM (KLONOPIN) 0.5 MG tablet Take 0.5 mg by mouth See admin instructions. Take 0.5 mg daily, may take a second 0.5 mg dose as needed for anxiety     diltiazem (TIAZAC) 360 MG 24 hr capsule Take 360 mg by mouth daily.     ELIQUIS 5 MG TABS tablet TAKE 1 TABLET BY MOUTH TWICE A DAY 180 tablet 1   Ferrous Sulfate (IRON) 325 (65 Fe) MG TABS Take 325 mg by mouth 2 (two) times a week.     furosemide (LASIX) 20 MG tablet TAKE 1 & 1/2 TABLET BY MOUTH DAILY FOR FLUID 135 tablet 2   gabapentin (NEURONTIN) 300 MG capsule Take 300 mg by mouth 2 (two) times daily.     glucose blood (ACCU-CHEK AVIVA PLUS) test strip CHECK BLOOD SUGAR TWICE DAILY AS  DIRECTED     ibuprofen (ADVIL) 200 MG tablet Take 400 mg by mouth daily as needed for moderate pain.     metFORMIN (GLUCOPHAGE-XR) 500 MG 24 hr tablet Take 1,000 mg by mouth at bedtime.     metoprolol tartrate (LOPRESSOR) 50 MG tablet Take 1 tablet (50 mg total) by mouth 2 (two) times daily. 180 tablet 1   omeprazole (PRILOSEC) 20 MG capsule Take 20 mg by mouth daily.     Semaglutide, 1 MG/DOSE, (OZEMPIC, 1 MG/DOSE,) 4 MG/3ML SOPN Inject 1 mg into the skin once a week. 3 mL 6   lisinopril (ZESTRIL) 10 MG tablet Take 10 mg by mouth daily. (Patient not taking: Reported on 04/08/2022)     No current facility-administered medications for this visit.    Allergies:   Amiodarone and Hydrocodone   Social History:  The patient  reports that she quit smoking about 13 years ago. Her smoking use included cigarettes. She has never used smokeless tobacco. She reports that she does not drink alcohol  and does not use drugs.   Family History:  The patient's family history includes Cancer in her father; Diabetes in an other family member; Hypertension in an other family member; Stomach cancer in her father and maternal grandmother.   ROS:  Please see the history of present illness.   Otherwise, review of systems is positive for none.   All other systems are reviewed and negative.   PHYSICAL EXAM: VS:  BP 128/70   Pulse (!) 134   Ht '5\' 6"'$  (1.676 m)   Wt 206 lb 3.2 oz (93.5 kg)   SpO2 98%   BMI 33.28 kg/m  , BMI Body mass index is 33.28 kg/m. GEN: Well nourished, well developed, in no acute distress  HEENT: normal  Neck: no JVD, carotid bruits, or masses Cardiac: irregular, tachcyardic; no murmurs, rubs, or gallops,no edema  Respiratory:  clear to auscultation bilaterally, normal work of breathing GI: soft, nontender, nondistended, + BS MS: no deformity or atrophy  Skin: warm and dry Neuro:  Strength and sensation are intact Psych: euthymic mood, full affect  EKG:  EKG is ordered today. Personal  review of the ekg ordered shows atrial fibrillation, rate 134  Recent Labs: 06/05/2021: NT-Pro BNP 109 02/24/2022: BUN 11; Creatinine, Ser 1.14; Hemoglobin 11.0; Platelets 327; Potassium 4.7; Sodium 142    Lipid Panel     Component Value Date/Time   CHOL 159 04/10/2012 0117   TRIG 230 (H) 04/10/2012 0117   HDL 34 (L) 04/10/2012 0117   CHOLHDL 6 08/06/2009 1136   VLDL 46 (H) 04/10/2012 0117   LDLCALC 79 04/10/2012 0117   LDLDIRECT 117.4 08/06/2009 1136     Wt Readings from Last 3 Encounters:  04/08/22 206 lb 3.2 oz (93.5 kg)  02/24/22 207 lb (93.9 kg)  02/06/22 206 lb 9.6 oz (93.7 kg)      Other studies Reviewed: Additional studies/ records that were reviewed today include: TTE 09/24/20  Review of the above records today demonstrates:   1. Left ventricular ejection fraction, by estimation, is 60 to 65%. The  left ventricle has normal function. The left ventricle has no regional  wall motion abnormalities. There is mild concentric left ventricular  hypertrophy. Left ventricular diastolic  function could not be evaluated.   2. Right ventricular systolic function is normal. The right ventricular  size is normal.   3. Left atrial size was mildly dilated.   4. The mitral valve is normal in structure. Mild mitral valve  regurgitation. No evidence of mitral stenosis.   5. The aortic valve is normal in structure. Aortic valve regurgitation is  not visualized. No aortic stenosis is present.   6. The inferior vena cava is normal in size with greater than 50%  respiratory variability, suggesting right atrial pressure of 3 mmHg.    ASSESSMENT AND PLAN:  1.  Persistent atrial fibrillation: Currently on metoprolol 25 mg twice daily, Eliquis 5 mg twice daily, diltiazem 360 mg daily.  CHA2DS2-VASc of 4.  Status post ablation 11/15/2020 with repeat ablation 01/09/2022.  Unfortunately she has had more atrial fibrillation with rapid rates.  Her medication options are limited at this time.  She  would like to get back into normal rhythm as she is feeling poorly.  We Katherene Dinino increase her metoprolol to 50 mg twice daily.  We Keithan Dileonardo have her follow-up with [facility] surgery for possible surgical hybrid ablation.  It was thought that dofetilide was a poor option for her as she had persistent hypomagnesemia.  2.  Obesity: Lifestyle modification encouraged  3.  Obstructive sleep apnea: Intolerant to therapy.  Likely a trigger for her atrial fibrillation.  4.  Hypertension: Currently well controlled  5.  Hyperlipidemia: Continue Lipitor 40 mg daily.  6.  Secondary hypercoagulable state: Currently on Eliquis for atrial fibrillation as above.  Current medicines are reviewed at length with the patient today.   The patient does not have concerns regarding her medicines.  The following changes were made today: Increase metoprolol  Labs/ tests ordered today include:  Orders Placed This Encounter  Procedures   Ambulatory referral to Cardiology   EKG 12-Lead     Disposition:   FU 6 months  Signed, Romario Tith Meredith Leeds, MD  04/08/2022 1:41 PM     [facility] [address] [place] [address] [phone] (office) [phone] (fax)

## 2022-04-08 NOTE — Patient Instructions (Addendum)
Medication Instructions:  Your physician has recommended you make the following change in your medication:  INCREASE Metoprolol Tartrate (Lopressor)  to 50 mg twice daily  *If you need a refill on your cardiac medications before your next appointment, please call your pharmacy*   Lab Work: None ordered   Testing/Procedures: None ordered   Follow-Up: At [facility], you and your health needs are our priority.  As part of our continuing mission to provide you with exceptional heart care, we have created designated Provider Care Teams.  These Care Teams include your primary Cardiologist (physician) and Advanced Practice Providers (APPs -  Physician Assistants and Nurse Practitioners) who all work together to provide you with the care you need, when you need it.  Your next appointment:   6 week(s)  The format for your next appointment:   In Person  Provider:   You will follow up in the [facility] located at [facility]. Your provider will be: Roderic Palau, NP or Clint R. Fenton, PA-C   You have been referred to Dr. Danise Mina with [facility] for discussion of hybrid surgical procedure     Thank you for choosing [facility]!!   Trinidad Curet, RN [phone]  Other Instructions   Important Information About [place]

## 2022-04-23 ENCOUNTER — Ambulatory Visit (HOSPITAL_BASED_OUTPATIENT_CLINIC_OR_DEPARTMENT_OTHER): Payer: BC Managed Care – PPO | Attending: Cardiology | Admitting: Cardiology

## 2022-04-23 DIAGNOSIS — G4733 Obstructive sleep apnea (adult) (pediatric): Secondary | ICD-10-CM | POA: Diagnosis not present

## 2022-04-23 NOTE — Telephone Encounter (Signed)
Left message for the pt to call the office so that we may provide her with the PIN# for her Itamar sleep study.

## 2022-04-24 NOTE — Telephone Encounter (Signed)
Left message for the pt to disregard my calls as she has not been set up with an Itamar device as she does not have access to blue tooth, see previous notes .   I s/w with our other sleep coordinator in the office today and she stated that she called the sleep lab and they said she picked up the reg home study last night.

## 2022-04-28 NOTE — Progress Notes (Deleted)
    Patient Name: Kayla, Hendrix Date: 04/23/2022 Gender: Female D.O.B: 05-Aug-1951 Age (years): 62 Referring Provider: Will Camnitz Height (inches): 8 Interpreting Physician: Fransico Him MD, ABSM Weight (lbs): 206 RPSGT: Neeriemer, Holly BMI: 33 MRN: [id number] Neck Size: 14.00  CLINICAL INFORMATION Sleep Study Type: HST  Indication for sleep study: N/A  Epworth Sleepiness Score: 2  SLEEP STUDY TECHNIQUE A multi-channel overnight portable sleep study was performed. The channels recorded were: nasal airflow, thoracic respiratory movement, and oxygen saturation with a pulse oximetry. Snoring was also monitored.  MEDICATIONS Patient self administered medications include: N/A.  SLEEP ARCHITECTURE Patient was studied for 374.4 minutes. The sleep efficiency was 100.0 % and the patient was supine for 0%. The arousal index was 0.0 per hour.  RESPIRATORY PARAMETERS The overall AHI was 15.5 per hour, with a central apnea index of 0 per hour.  The oxygen nadir was 86% during sleep.  CARDIAC DATA Mean heart rate during sleep was 99.2 bpm.  IMPRESSIONS - Moderate obstructive sleep apnea occurred during this study (AHI = 15.5/h). - Moderate oxygen desaturation was noted during this study (Min O2 = 86%). - Patient snored 16.6% during the sleep. DIAGNOSIS - Obstructive Sleep Apnea (G47.33)  RECOMMENDATIONS - Recommend in lab CPAP titration. - Positional therapy avoiding supine position during sleep. - Avoid alcohol, sedatives and other CNS depressants that may worsen sleep apnea and disrupt normal sleep architecture. - Sleep hygiene should be reviewed to assess factors that may improve sleep quality. - Weight management and regular exercise should be initiated or continued.  [Electronically signed] 04/28/2022 02:36 PM  Fransico Him MD, ABSM Diplomate, American Board of Sleep Medicine

## 2022-04-28 NOTE — Procedures (Signed)
    Patient Name: Kayla Hendrix, Kayla Hendrix Date: 04/23/2022 Gender: Female D.O.B: 03-31-1951 Age (years): 14 Referring Provider: Will Camnitz Height (inches): 59 Interpreting Physician: Fransico Him MD, ABSM Weight (lbs): 206 RPSGT: Neeriemer, Holly BMI: 33 MRN: [id number] Neck Size: 14.00  CLINICAL INFORMATION Sleep Study Type: HST  Indication for sleep study: N/A  Epworth Sleepiness Score: 2  SLEEP STUDY TECHNIQUE A multi-channel overnight portable sleep study was performed. The channels recorded were: nasal airflow, thoracic respiratory movement, and oxygen saturation with a pulse oximetry. Snoring was also monitored.  MEDICATIONS Patient self administered medications include: N/A.  SLEEP ARCHITECTURE Patient was studied for 374.4 minutes. The sleep efficiency was 100.0 % and the patient was supine for 0%. The arousal index was 0.0 per hour.  RESPIRATORY PARAMETERS The overall AHI was 15.5 per hour, with a central apnea index of 0 per hour.  The oxygen nadir was 86% during sleep.  CARDIAC DATA Mean heart rate during sleep was 99.2 bpm.  IMPRESSIONS - Moderate obstructive sleep apnea occurred during this study (AHI = 15.5/h). - Moderate oxygen desaturation was noted during this study (Min O2 = 86%). - Patient snored 16.6% during the sleep. DIAGNOSIS - Obstructive Sleep Apnea (G47.33)  RECOMMENDATIONS - Recommend in lab CPAP titration. - Positional therapy avoiding supine position during sleep. - Avoid alcohol, sedatives and other CNS depressants that may worsen sleep apnea and disrupt normal sleep architecture. - Sleep hygiene should be reviewed to assess factors that may improve sleep quality. - Weight management and regular exercise should be initiated or continued.  [Electronically signed] 04/28/2022 02:36 PM  Fransico Him MD, ABSM Diplomate, American Board of Sleep Medicine

## 2022-05-19 ENCOUNTER — Telehealth: Payer: Self-pay | Admitting: *Deleted

## 2022-05-19 DIAGNOSIS — G4733 Obstructive sleep apnea (adult) (pediatric): Secondary | ICD-10-CM

## 2022-05-19 DIAGNOSIS — I5032 Chronic diastolic (congestive) heart failure: Secondary | ICD-10-CM

## 2022-05-19 DIAGNOSIS — I10 Essential (primary) hypertension: Secondary | ICD-10-CM

## 2022-05-19 NOTE — Telephone Encounter (Signed)
-----   Message from Sueanne Margarita, MD sent at 04/28/2022  2:39 PM EDT ----- Please let patient know that they have sleep apnea.  Recommend therapeutic CPAP titration for treatment of patient's sleep disordered breathing.  If unable to perform an in lab titration then initiate ResMed auto CPAP from 4 to 15cm H2O with heated humidity and mask of choice and overnight pulse ox on CPAP.

## 2022-05-19 NOTE — Telephone Encounter (Signed)
The patient has been notified of the result. Left detailed message on voicemail and informed patient to call back. Marolyn Hammock, [place] 05/19/2022 6:00 PM

## 2022-05-19 NOTE — Addendum Note (Signed)
Addended by: [name] on: 05/19/2022 06:08 PM   Modules accepted: Orders

## 2022-05-20 ENCOUNTER — Encounter (HOSPITAL_COMMUNITY): Payer: Self-pay | Admitting: Nurse Practitioner

## 2022-05-20 ENCOUNTER — Ambulatory Visit (HOSPITAL_COMMUNITY)
Admission: RE | Admit: 2022-05-20 | Discharge: 2022-05-20 | Disposition: A | Discharge status: Home / Self Care | Payer: BC Managed Care – PPO | Source: Ambulatory Visit | Attending: Nurse Practitioner | Admitting: Nurse Practitioner

## 2022-05-20 VITALS — BP 126/60 | HR 107 | Ht 66.0 in | Wt 209.8 lb

## 2022-05-20 DIAGNOSIS — I4819 Other persistent atrial fibrillation: Secondary | ICD-10-CM

## 2022-05-20 DIAGNOSIS — I4891 Unspecified atrial fibrillation: Secondary | ICD-10-CM | POA: Diagnosis not present

## 2022-05-20 DIAGNOSIS — Z79899 Other long term (current) drug therapy: Secondary | ICD-10-CM | POA: Diagnosis not present

## 2022-05-20 DIAGNOSIS — D6869 Other thrombophilia: Secondary | ICD-10-CM | POA: Diagnosis not present

## 2022-05-20 DIAGNOSIS — Z7901 Long term (current) use of anticoagulants: Secondary | ICD-10-CM | POA: Insufficient documentation

## 2022-05-20 DIAGNOSIS — I10 Essential (primary) hypertension: Secondary | ICD-10-CM | POA: Diagnosis not present

## 2022-05-20 DIAGNOSIS — G4733 Obstructive sleep apnea (adult) (pediatric): Secondary | ICD-10-CM | POA: Insufficient documentation

## 2022-05-20 LAB — BASIC METABOLIC PANEL
Anion gap: 10 (ref 5–15)
BUN: 14 mg/dL (ref 8–23)
CO2: 28 mmol/L (ref 22–32)
Calcium: 8.8 mg/dL — ABNORMAL LOW (ref 8.9–10.3)
Chloride: 100 mmol/L (ref 98–111)
Creatinine, Ser: 1.06 mg/dL — ABNORMAL HIGH (ref 0.44–1.00)
GFR, Estimated: 57 mL/min — ABNORMAL LOW (ref >60)
Glucose, Bld: 119 mg/dL — ABNORMAL HIGH (ref 70–99)
Potassium: 4.1 mmol/L (ref 3.5–5.1)
Sodium: 138 mmol/L (ref 135–145)

## 2022-05-20 LAB — MAGNESIUM: Magnesium: 1.7 mg/dL (ref 1.7–2.4)

## 2022-05-20 MED ORDER — METOPROLOL TARTRATE 50 MG PO TABS: ORAL_TABLET | ORAL | Status: DC

## 2022-05-20 NOTE — Telephone Encounter (Signed)
Patient is returning call.  °
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
## 2022-05-22 NOTE — Telephone Encounter (Signed)
Returned call: The patient has been notified of the result and verbalized understanding.  All questions (if any) were answered. Kayla Hendrix, [place] 05/22/2022 12:12 PM   Patient states she declines to have the titration done as she can not wear a cpap. She has already tried one before and she was intolerant to it. Patient is looking for another option.  05/20/22- Per Roderic Palau: Has OSA but intolerant to cpap.  Per Will Camnitz:11/26/21 Obstructive sleep apnea: Noncompliant with therapy.  Likely contributes to refractoriness of atrial fibrillation therapy.  Will refer back to sleep clinic to discuss other therapy options.

## 2022-05-26 ENCOUNTER — Telehealth (HOSPITAL_COMMUNITY): Payer: Self-pay

## 2022-05-26 MED ORDER — MAGNESIUM OXIDE 400 MG PO CAPS: ORAL_CAPSULE | ORAL | 6 refills | Status: DC

## 2022-05-26 NOTE — Telephone Encounter (Signed)
Patient's magnesium level has come up to 1.7. Per Roderic Palau NP notify patient to stop her Omeprazole medication and start Pepcid- 20 mg OTC and take this medication daily. She was also advised to start Magnesium Oxide- 400 mg- Tablet- Taking one tablet by mouth daily with food. [facility] [place]. She is scheduled for a one week recheck Magnesium next Wednesday. Consulted with patient and she verbalized understanding.

## 2022-05-28 DIAGNOSIS — E1122 Type 2 diabetes mellitus with diabetic chronic kidney disease: Secondary | ICD-10-CM | POA: Diagnosis not present

## 2022-06-04 ENCOUNTER — Ambulatory Visit (HOSPITAL_COMMUNITY)
Admission: RE | Admit: 2022-06-04 | Discharge: 2022-06-04 | Disposition: A | Discharge status: Home / Self Care | Payer: BC Managed Care – PPO | Source: Ambulatory Visit | Attending: Nurse Practitioner | Admitting: Nurse Practitioner

## 2022-06-04 DIAGNOSIS — Z961 Presence of intraocular lens: Secondary | ICD-10-CM | POA: Diagnosis not present

## 2022-06-04 DIAGNOSIS — I4819 Other persistent atrial fibrillation: Secondary | ICD-10-CM | POA: Diagnosis not present

## 2022-06-04 DIAGNOSIS — E119 Type 2 diabetes mellitus without complications: Secondary | ICD-10-CM | POA: Diagnosis not present

## 2022-06-04 LAB — MAGNESIUM: Magnesium: 2 mg/dL (ref 1.7–2.4)

## 2022-06-11 ENCOUNTER — Ambulatory Visit (HOSPITAL_COMMUNITY)
Admission: RE | Admit: 2022-06-11 | Discharge: 2022-06-11 | Disposition: A | Discharge status: Home / Self Care | Payer: BC Managed Care – PPO | Source: Ambulatory Visit | Attending: Nurse Practitioner | Admitting: Nurse Practitioner

## 2022-06-11 VITALS — HR 125

## 2022-06-11 DIAGNOSIS — I4891 Unspecified atrial fibrillation: Secondary | ICD-10-CM | POA: Insufficient documentation

## 2022-06-11 LAB — BASIC METABOLIC PANEL
Anion gap: 8 (ref 5–15)
BUN: 11 mg/dL (ref 8–23)
CO2: 29 mmol/L (ref 22–32)
Calcium: 8.8 mg/dL — ABNORMAL LOW (ref 8.9–10.3)
Chloride: 101 mmol/L (ref 98–111)
Creatinine, Ser: 0.97 mg/dL (ref 0.44–1.00)
GFR, Estimated: >60 mL/min (ref >60)
Glucose, Bld: 101 mg/dL — ABNORMAL HIGH (ref 70–99)
Potassium: 4.5 mmol/L (ref 3.5–5.1)
Sodium: 138 mmol/L (ref 135–145)

## 2022-06-11 LAB — MAGNESIUM: Magnesium: 1.9 mg/dL (ref 1.7–2.4)

## 2022-06-11 NOTE — Progress Notes (Signed)
Pt in for ekg and labs to attempt Tikosyn admit. Her mag last week was acceptable with stopping her PPI and starting pepcid. If mag/k+ acceptable this week will go ahead and schedule admit. Her Qt today with afib with v rate 125 bpm is acceptable at 386 ms. She has prn BB to use to control rapid rates.

## 2022-06-12 ENCOUNTER — Telehealth: Payer: Self-pay

## 2022-06-12 NOTE — Telephone Encounter (Addendum)
Medication list reviewed in anticipation of upcoming Tikosyn initiation.   Patient is not taking any contraindicated or QTc prolonging medications.   Patient is taking famotidine which has a conditional risk of torsades des pointes and may cause QT prolongation but there is mixed data suggesting this. Close monitoring of QTc should be done.  Patient is anticoagulated on Eliquis on the appropriate dose. Please ensure that patient has not missed any anticoagulation doses in the 3 weeks prior to Tikosyn initiation.   Patient will need to be counseled to avoid use of Benadryl while on Tikosyn and in the 2-3 days prior to Tikosyn initiation.   Please ensure K>4 and Mg>2 given patient is taking furosemide.

## 2022-06-17 ENCOUNTER — Encounter (HOSPITAL_COMMUNITY): Payer: Self-pay | Admitting: *Deleted

## 2022-06-20 ENCOUNTER — Telehealth (HOSPITAL_COMMUNITY): Payer: Self-pay

## 2022-06-20 NOTE — Telephone Encounter (Signed)
Contacted [employer] to initiate prior authorization for Tikosyn preadmission: date of service 07/08/22. Authorization is pending. Case # [id number] Fax # [phone] Faxed Clinical information for review.

## 2022-06-21 NOTE — Addendum Note (Signed)
Addended by: Drue Novel I on: 06/21/2022 04:33 PM   Modules accepted: Orders

## 2022-06-24 ENCOUNTER — Telehealth: Payer: Self-pay | Admitting: *Deleted

## 2022-06-24 NOTE — Chronic Care Management (AMB) (Signed)
  Care Coordination  Outreach Note  06/24/2022 Name: JESSAMYN WATTERSON MRN: [id number] DOB: Mar 11, 1951   Care Coordination Outreach Attempts  An unsuccessful telephone outreach was attempted today to offer the patient information about available care coordination services as a benefit of their health plan.   Follow Up Plan:  Additional outreach attempts will be made to offer the patient care coordination information and services.   Encounter Outcome:  No Answer  [place]  Direct Dial: [phone]

## 2022-06-24 NOTE — Chronic Care Management (AMB) (Signed)
  Care Coordination   Note   06/24/2022 Name: Kayla Hendrix MRN: [id number] DOB: 02-06-1951  Kayla Hendrix is a 71 y.o. year old female who sees Wile, Jesse Sans, MD for primary care. I reached out to Samantha Crimes by phone today to offer care coordination services.  [name] was given information about Care Coordination services today including:   The Care Coordination services include support from the care team which includes your Nurse Coordinator, Clinical Social Worker, or Pharmacist.  The Care Coordination team is here to help remove barriers to the health concerns and goals most important to you. Care Coordination services are voluntary, and the patient may decline or stop services at any time by request to their care team member.   Care Coordination Consent Status: Patient agreed to services and verbal consent obtained.   Follow up plan:  Telephone appointment with care coordination team member scheduled for:  06/30/22  Encounter Outcome:  Pt. Scheduled  [place]  Direct Dial: [phone]

## 2022-06-30 ENCOUNTER — Ambulatory Visit: Payer: Self-pay

## 2022-06-30 NOTE — Patient Outreach (Signed)
  Care Coordination   06/30/2022 Name: Kayla Hendrix MRN: [id number] DOB: Mar 07, 1951   Care Coordination Outreach Attempts:  An unsuccessful telephone outreach was attempted today to offer the patient information about available care coordination services as a benefit of their health plan.   Follow Up Plan:  Additional outreach attempts will be made to offer the patient care coordination information and services.   Encounter Outcome:  No Answer  Care Coordination Interventions Activated:  No   Care Coordination Interventions:  No, not indicated    Daneen Schick, BSW, CDP Social Worker, Certified Dementia Practitioner Care Coordination [phone]

## 2022-07-06 ENCOUNTER — Other Ambulatory Visit: Payer: Self-pay | Admitting: Internal Medicine

## 2022-07-06 DIAGNOSIS — I480 Paroxysmal atrial fibrillation: Secondary | ICD-10-CM

## 2022-07-07 ENCOUNTER — Telehealth (HOSPITAL_COMMUNITY): Payer: Self-pay | Admitting: *Deleted

## 2022-07-07 NOTE — Telephone Encounter (Signed)
Prescription refill request for Eliquis received. Indication:Afib Last office visit:5/23 Scr:0.9 Age: 71 Weight:95.2 kg  Prescription refilled

## 2022-07-07 NOTE — Telephone Encounter (Signed)
Inpatient hospitalization stay for 8/29 approved reference number [id number]

## 2022-07-08 ENCOUNTER — Inpatient Hospital Stay (HOSPITAL_COMMUNITY)
Admission: RE | Admit: 2022-07-08 | Discharge: 2022-07-12 | DRG: 310 | Disposition: A | Discharge status: Home / Self Care | Payer: BC Managed Care – PPO | Source: Ambulatory Visit | Attending: Cardiology | Admitting: Cardiology

## 2022-07-08 ENCOUNTER — Telehealth (HOSPITAL_COMMUNITY): Payer: Self-pay | Admitting: Pharmacy Technician

## 2022-07-08 ENCOUNTER — Encounter (HOSPITAL_COMMUNITY): Payer: Self-pay | Admitting: Nurse Practitioner

## 2022-07-08 ENCOUNTER — Other Ambulatory Visit (HOSPITAL_COMMUNITY): Payer: Self-pay

## 2022-07-08 ENCOUNTER — Ambulatory Visit (HOSPITAL_COMMUNITY)
Admission: RE | Admit: 2022-07-08 | Discharge: 2022-07-08 | Disposition: A | Discharge status: Home / Self Care | Payer: BC Managed Care – PPO | Source: Ambulatory Visit | Attending: Nurse Practitioner | Admitting: Nurse Practitioner

## 2022-07-08 VITALS — BP 124/72 | HR 140 | Ht 66.0 in | Wt 210.2 lb

## 2022-07-08 DIAGNOSIS — Z6833 Body mass index (BMI) 33.0-33.9, adult: Secondary | ICD-10-CM | POA: Diagnosis not present

## 2022-07-08 DIAGNOSIS — Z7984 Long term (current) use of oral hypoglycemic drugs: Secondary | ICD-10-CM

## 2022-07-08 DIAGNOSIS — I4891 Unspecified atrial fibrillation: Secondary | ICD-10-CM | POA: Diagnosis present

## 2022-07-08 DIAGNOSIS — Z8719 Personal history of other diseases of the digestive system: Secondary | ICD-10-CM

## 2022-07-08 DIAGNOSIS — Z833 Family history of diabetes mellitus: Secondary | ICD-10-CM

## 2022-07-08 DIAGNOSIS — I440 Atrioventricular block, first degree: Secondary | ICD-10-CM | POA: Diagnosis not present

## 2022-07-08 DIAGNOSIS — E669 Obesity, unspecified: Secondary | ICD-10-CM | POA: Diagnosis not present

## 2022-07-08 DIAGNOSIS — Z7901 Long term (current) use of anticoagulants: Secondary | ICD-10-CM

## 2022-07-08 DIAGNOSIS — E785 Hyperlipidemia, unspecified: Secondary | ICD-10-CM | POA: Diagnosis present

## 2022-07-08 DIAGNOSIS — Z8249 Family history of ischemic heart disease and other diseases of the circulatory system: Secondary | ICD-10-CM

## 2022-07-08 DIAGNOSIS — I4819 Other persistent atrial fibrillation: Secondary | ICD-10-CM

## 2022-07-08 DIAGNOSIS — G4733 Obstructive sleep apnea (adult) (pediatric): Secondary | ICD-10-CM | POA: Diagnosis present

## 2022-07-08 DIAGNOSIS — I10 Essential (primary) hypertension: Secondary | ICD-10-CM | POA: Diagnosis not present

## 2022-07-08 DIAGNOSIS — K219 Gastro-esophageal reflux disease without esophagitis: Secondary | ICD-10-CM | POA: Diagnosis present

## 2022-07-08 DIAGNOSIS — Z79899 Other long term (current) drug therapy: Secondary | ICD-10-CM | POA: Diagnosis not present

## 2022-07-08 DIAGNOSIS — D6869 Other thrombophilia: Secondary | ICD-10-CM

## 2022-07-08 DIAGNOSIS — Z800 Family history of malignant neoplasm of digestive organs: Secondary | ICD-10-CM | POA: Diagnosis not present

## 2022-07-08 DIAGNOSIS — E119 Type 2 diabetes mellitus without complications: Secondary | ICD-10-CM | POA: Diagnosis not present

## 2022-07-08 DIAGNOSIS — I2510 Atherosclerotic heart disease of native coronary artery without angina pectoris: Secondary | ICD-10-CM | POA: Diagnosis not present

## 2022-07-08 LAB — BASIC METABOLIC PANEL
Anion gap: 9 (ref 5–15)
Anion gap: 8 (ref 5–15)
BUN: 15 mg/dL (ref 8–23)
BUN: 13 mg/dL (ref 8–23)
CO2: 26 mmol/L (ref 22–32)
CO2: 28 mmol/L (ref 22–32)
Calcium: 8.9 mg/dL (ref 8.9–10.3)
Calcium: 9.2 mg/dL (ref 8.9–10.3)
Chloride: 106 mmol/L (ref 98–111)
Chloride: 103 mmol/L (ref 98–111)
Creatinine, Ser: 1.12 mg/dL — ABNORMAL HIGH (ref 0.44–1.00)
Creatinine, Ser: 1.05 mg/dL — ABNORMAL HIGH (ref 0.44–1.00)
GFR, Estimated: 53 mL/min — ABNORMAL LOW (ref >60)
GFR, Estimated: 57 mL/min — ABNORMAL LOW (ref >60)
Glucose, Bld: 142 mg/dL — ABNORMAL HIGH (ref 70–99)
Glucose, Bld: 99 mg/dL (ref 70–99)
Potassium: 4.1 mmol/L (ref 3.5–5.1)
Potassium: 3.9 mmol/L (ref 3.5–5.1)
Sodium: 141 mmol/L (ref 135–145)
Sodium: 139 mmol/L (ref 135–145)

## 2022-07-08 LAB — MAGNESIUM
Magnesium: 1.8 mg/dL (ref 1.7–2.4)
Magnesium: 1.8 mg/dL (ref 1.7–2.4)

## 2022-07-08 LAB — HIV ANTIBODY (ROUTINE TESTING W REFLEX): HIV Screen 4th Generation wRfx: NONREACTIVE

## 2022-07-08 MED ORDER — MAGNESIUM OXIDE -MG SUPPLEMENT 400 (240 MG) MG PO TABS
400.0000 mg | ORAL_TABLET | Freq: Every day | ORAL | Status: DC
  Administered 2022-07-09 – 2022-07-12 (×4): 400 mg via ORAL
  Filled 2022-07-08 (×5): qty 1

## 2022-07-08 MED ORDER — FAMOTIDINE 20 MG PO TABS
20.0000 mg | ORAL_TABLET | Freq: Two times a day (BID) | ORAL | Status: DC
  Administered 2022-07-08 – 2022-07-12 (×8): 20 mg via ORAL
  Filled 2022-07-08 (×8): qty 1

## 2022-07-08 MED ORDER — METFORMIN HCL ER 500 MG PO TB24
1000.0000 mg | ORAL_TABLET | Freq: Every day | ORAL | Status: DC
  Administered 2022-07-08 – 2022-07-11 (×4): 1000 mg via ORAL
  Filled 2022-07-08 (×5): qty 2

## 2022-07-08 MED ORDER — DOFETILIDE 500 MCG PO CAPS
500.0000 ug | ORAL_CAPSULE | Freq: Two times a day (BID) | ORAL | Status: DC
  Administered 2022-07-08: 500 ug via ORAL
  Filled 2022-07-08: qty 1

## 2022-07-08 MED ORDER — SODIUM CHLORIDE 0.9% FLUSH
3.0000 mL | Freq: Two times a day (BID) | INTRAVENOUS | Status: DC
  Administered 2022-07-08 – 2022-07-12 (×6): 3 mL via INTRAVENOUS

## 2022-07-08 MED ORDER — METOPROLOL TARTRATE 25 MG PO TABS
25.0000 mg | ORAL_TABLET | Freq: Two times a day (BID) | ORAL | Status: DC
  Administered 2022-07-08 – 2022-07-12 (×8): 25 mg via ORAL
  Filled 2022-07-08 (×8): qty 1

## 2022-07-08 MED ORDER — POTASSIUM CHLORIDE CRYS ER 20 MEQ PO TBCR
20.0000 meq | EXTENDED_RELEASE_TABLET | Freq: Once | ORAL | Status: AC
  Administered 2022-07-08: 20 meq via ORAL
  Filled 2022-07-08: qty 1

## 2022-07-08 MED ORDER — SODIUM CHLORIDE 0.9 % IV SOLN: 250.0000 mL | INTRAVENOUS | Status: DC | PRN

## 2022-07-08 MED ORDER — SODIUM CHLORIDE 0.9% FLUSH: 3.0000 mL | INTRAVENOUS | Status: DC | PRN

## 2022-07-08 MED ORDER — METOPROLOL TARTRATE 50 MG PO TABS: 50.0000 mg | ORAL_TABLET | Freq: Every day | ORAL | Status: DC

## 2022-07-08 MED ORDER — CLONAZEPAM 0.5 MG PO TABS
0.5000 mg | ORAL_TABLET | Freq: Two times a day (BID) | ORAL | Status: DC | PRN
  Administered 2022-07-12: 0.5 mg via ORAL
  Filled 2022-07-08: qty 1

## 2022-07-08 MED ORDER — GABAPENTIN 300 MG PO CAPS
300.0000 mg | ORAL_CAPSULE | Freq: Two times a day (BID) | ORAL | Status: DC
  Administered 2022-07-08 – 2022-07-12 (×8): 300 mg via ORAL
  Filled 2022-07-08 (×8): qty 1

## 2022-07-08 MED ORDER — APIXABAN 5 MG PO TABS
5.0000 mg | ORAL_TABLET | Freq: Two times a day (BID) | ORAL | Status: DC
  Administered 2022-07-08 – 2022-07-12 (×8): 5 mg via ORAL
  Filled 2022-07-08 (×8): qty 1

## 2022-07-08 MED ORDER — DILTIAZEM HCL ER COATED BEADS 240 MG PO CP24
360.0000 mg | ORAL_CAPSULE | Freq: Every day | ORAL | Status: DC
  Administered 2022-07-09 – 2022-07-12 (×4): 360 mg via ORAL
  Filled 2022-07-08 (×5): qty 1

## 2022-07-08 MED ORDER — METOPROLOL TARTRATE 25 MG PO TABS: 25.0000 mg | ORAL_TABLET | Freq: Every day | ORAL | Status: DC

## 2022-07-08 MED ORDER — ACETAMINOPHEN 500 MG PO TABS
1000.0000 mg | ORAL_TABLET | Freq: Four times a day (QID) | ORAL | Status: DC | PRN
Start: 2022-07-08 — End: 2022-07-12

## 2022-07-08 MED ORDER — MAGNESIUM SULFATE 2 GM/50ML IV SOLN
2.0000 g | Freq: Once | INTRAVENOUS | Status: AC
  Administered 2022-07-08: 2 g via INTRAVENOUS
  Filled 2022-07-08: qty 50

## 2022-07-08 MED ORDER — FUROSEMIDE 40 MG PO TABS
40.0000 mg | ORAL_TABLET | Freq: Every day | ORAL | Status: DC
  Administered 2022-07-09 – 2022-07-12 (×4): 40 mg via ORAL
  Filled 2022-07-08 (×4): qty 1

## 2022-07-08 MED ORDER — METOPROLOL SUCCINATE ER 25 MG PO TB24: 12.5000 mg | ORAL_TABLET | ORAL | Status: DC | PRN

## 2022-07-08 NOTE — Telephone Encounter (Signed)
Pharmacy Patient Advocate Encounter  Insurance verification completed.    The patient is insured through [employer]   The patient is currently admitted and ran test claims for the following: dofetilide (Tikosyn) .  Copays and coinsurance results were relayed to Inpatient clinical team.

## 2022-07-08 NOTE — TOC Benefit Eligibility Note (Signed)
Patient Teacher, English as a foreign language completed.    The patient is currently admitted and upon discharge could be taking dofetilide (Tikosyn) 500 mcg capsules.  The current 30 day co-pay is $11.83.   The patient is insured through [place] Patient Advocate Specialist [place] Patient Advocate Team Direct Number: [phone]  Fax: [phone]

## 2022-07-08 NOTE — Care Management (Signed)
[id number] 07-08-22 Patient presented for Tikosyn Load. Benefits check submitted for cost. Case Manager will continue to follow for cost and pharmacy of choice.

## 2022-07-08 NOTE — Progress Notes (Signed)
Pharmacy: Dofetilide (Tikosyn) - Initial Consult Assessment and Electrolyte Replacement  Pharmacy consulted to assist in monitoring and replacing electrolytes in this 71 y.o. female admitted on 07/08/2022 undergoing dofetilide initiation.  Assessment:  Patient Exclusion Criteria: If any screening criteria checked as "Yes", then  patient  should NOT receive dofetilide until criteria item is corrected.  If "Yes" please indicate correction plan.  YES  NO Patient  Exclusion Criteria Correction Plan   '[x]'$   '[]'$   Baseline QTc interval is greater than or equal to 440 msec. IF above YES box checked dofetilide contraindicated unless patient has ICD; then may proceed if QTc 500-550 msec or with known ventricular conduction abnormalities may proceed with QTc 550-600 msec. QTc =  506 Per clinic notes QTc was lower in previous visits. Plans are to continue [place] with close monitoring   '[]'$   '[x]'$   Patient is known or suspected to have a digoxin level greater than 2 ng/ml: No results found for: "DIGOXIN"     '[]'$   '[x]'$   Creatinine clearance less than 20 ml/min (calculated using Cockcroft-Gault, actual body weight and serum creatinine): Estimated Creatinine Clearance: 54.4 mL/min (A) (by C-G formula based on SCr of 1.12 mg/dL (H)).     '[]'$   '[x]'$  Patient has received drugs known to prolong the QT intervals within the last 48 hours (phenothiazines, tricyclics or tetracyclic antidepressants, erythromycin, H-1 antihistamines, cisapride, fluoroquinolones, azithromycin, ondansetron).   Updated information on QT prolonging agents is available to be searched on the following database:QT prolonging agents  Pepcid- conditional risk. No adjustments recommended    '[]'$   '[x]'$   Patient received a dose of hydrochlorothiazide (Oretic) alone or in any combination including triamterene (Dyazide, Maxzide) in the last 48 hours.    '[]'$   '[x]'$  Patient received a medication known to increase dofetilide plasma concentrations  prior to initial dofetilide dose:  Trimethoprim (Primsol, Proloprim) in the last 36 hours Verapamil (Calan, Verelan) in the last 36 hours or a sustained release dose in the last 72 hours Megestrol (Megace) in the last 5 days  Cimetidine (Tagamet) in the last 6 hours Ketoconazole (Nizoral) in the last 24 hours Itraconazole (Sporanox) in the last 48 hours  Prochlorperazine (Compazine) in the last 36 hours     '[]'$   '[x]'$   Patient is known to have a history of torsades de pointes; congenital or acquired long QT syndromes.    '[]'$   '[x]'$   Patient has received a Class 1 antiarrhythmic with less than 2 half-lives since last dose. (Disopyramide, Quinidine, Procainamide, Lidocaine, Mexiletine, Flecainide, Propafenone)    '[]'$   '[x]'$   Patient has received amiodarone therapy in the past 3 months or amiodarone level is greater than 0.3 ng/ml.    Labs:    Component Value Date/Time   K 4.1 07/08/2022 0935   K 3.7 04/10/2012 0117   MG 1.8 07/08/2022 0935   MG 1.8 04/10/2012 0117     Plan: Select One Calculated CrCl  Dose q12h  '[x]'$  > 60 ml/min 500 mcg  '[]'$  40-60 ml/min 250 mcg  '[]'$  20-40 ml/min 125 mcg   '[x]'$   Physician selected initial dose within range recommended for patients level of renal function - will monitor for response.  '[]'$   Physician selected initial dose outside of range recommended for patients level of renal function - will discuss if the dose should be altered at this time.   Patient has been appropriately anticoagulated with apixiban.  Potassium: K >/= 4: Appropriate to initiate Tikosyn, no replacement needed    Magnesium:  Mg 1.8-2: Give Mg 2 gm IV x1 to prevent Mg from dropping below 1.8 - do not need to recheck Mg. Appropriate to initiate Tikosyn   Thank you for allowing pharmacy to participate in this patient's care   Hildred Laser, PharmD Clinical Pharmacist **Pharmacist phone directory can now be found on [email or web address] ([email or web address]).  Listed under [place].
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
## 2022-07-09 LAB — MAGNESIUM: Magnesium: 2.1 mg/dL (ref 1.7–2.4)

## 2022-07-09 LAB — BASIC METABOLIC PANEL
Anion gap: 11 (ref 5–15)
BUN: 11 mg/dL (ref 8–23)
CO2: 26 mmol/L (ref 22–32)
Calcium: 8.8 mg/dL — ABNORMAL LOW (ref 8.9–10.3)
Chloride: 103 mmol/L (ref 98–111)
Creatinine, Ser: 1.03 mg/dL — ABNORMAL HIGH (ref 0.44–1.00)
GFR, Estimated: 58 mL/min — ABNORMAL LOW (ref >60)
Glucose, Bld: 92 mg/dL (ref 70–99)
Potassium: 3.8 mmol/L (ref 3.5–5.1)
Sodium: 140 mmol/L (ref 135–145)

## 2022-07-09 MED ORDER — DOFETILIDE 250 MCG PO CAPS
250.0000 ug | ORAL_CAPSULE | Freq: Two times a day (BID) | ORAL | Status: DC
  Administered 2022-07-09 (×2): 250 ug via ORAL
  Filled 2022-07-09 (×2): qty 1

## 2022-07-09 MED ORDER — POTASSIUM CHLORIDE CRYS ER 20 MEQ PO TBCR
40.0000 meq | EXTENDED_RELEASE_TABLET | Freq: Once | ORAL | Status: AC
  Administered 2022-07-09: 40 meq via ORAL
  Filled 2022-07-09: qty 2

## 2022-07-09 NOTE — Progress Notes (Signed)
Rounding Note    Patient Name: Kayla Hendrix Date of Encounter: 07/09/2022  [facility] EP/Cardiologist: Dr. Curt Bears  Subjective   No CP, SOB, anxious about her rhythm management  Inpatient Medications    Scheduled Meds:  apixaban  5 mg Oral BID   diltiazem  360 mg Oral Daily   dofetilide  250 mcg Oral BID   famotidine  20 mg Oral BID   furosemide  40 mg Oral Daily   gabapentin  300 mg Oral BID   magnesium oxide  400 mg Oral Daily   metFORMIN  1,000 mg Oral Q supper   metoprolol tartrate  25 mg Oral BID   sodium chloride flush  3 mL Intravenous Q12H   Continuous Infusions:  sodium chloride     PRN Meds: sodium chloride, acetaminophen, clonazePAM, sodium chloride flush   Vital Signs    Vitals:   07/08/22 2232 07/09/22 0033 07/09/22 0420 07/09/22 0446  BP: 125/77 124/63 132/61   Pulse: 95 69 66   Resp:  16 16   Temp:  97.8 F (36.6 C) 97.8 F (36.6 C)   TempSrc:  Oral Oral   SpO2:  98% 91% 95%  Weight:      Height:        Intake/Output Summary (Last 24 hours) at 07/09/2022 0812 Last data filed at 07/09/2022 0600 Gross per 24 hour  Intake 767.85 ml  Output --  Net 767.85 ml      07/08/2022    1:09 PM 07/08/2022    9:42 AM 05/20/2022    3:39 PM  Last 3 Weights  Weight (lbs) 210 lb 3.2 oz 210 lb 3.2 oz 209 lb 12.8 oz  Weight (kg) 95.346 kg 95.346 kg 95.165 kg      Telemetry    AFib > SR 60's  - Personally Reviewed  ECG    Post dose: AFib 93bpm, QTc 534 Post conversion is SR 63bpm, manually measured QT 500-565m, QTc 512-534m- Personally Reviewed with Dr. LaQuentin OrePhysical Exam   GEN: No acute distress.   Neck: No JVD Cardiac: RRR, no murmurs, rubs, or gallops.  Respiratory: CTA b/l. GI: Soft, nontender, non-distended  MS: No edema; No deformity. Neuro:  Nonfocal  Psych: Normal affect   Labs    High Sensitivity Troponin:  No results for input(s): "TROPONINIHS" in the last 720 hours.   Chemistry Recent Labs  Lab  07/08/22 0935 07/08/22 1514  NA 141 139  K 4.1 3.9  CL 106 103  CO2 26 28  GLUCOSE 142* 99  BUN 15 13  CREATININE 1.12* 1.05*  CALCIUM 8.9 9.2  MG 1.8 1.8  GFRNONAA 53* 57*  ANIONGAP 9 8    Lipids No results for input(s): "CHOL", "TRIG", "HDL", "LABVLDL", "LDLCALC", "CHOLHDL" in the last 168 hours.  HematologyNo results for input(s): "WBC", "RBC", "HGB", "HCT", "MCV", "MCH", "MCHC", "RDW", "PLT" in the last 168 hours. Thyroid No results for input(s): "TSH", "FREET4" in the last 168 hours.  BNPNo results for input(s): "BNP", "PROBNP" in the last 168 hours.  DDimer No results for input(s): "DDIMER" in the last 168 hours.   Radiology    No results found.  Cardiac Studies   TTE 09/24/20   1. Left ventricular ejection fraction, by estimation, is 60 to 65%. The  left ventricle has normal function. The left ventricle has no regional  wall motion abnormalities. There is mild concentric left ventricular  hypertrophy. Left ventricular diastolic  function could not be  evaluated.   2. Right ventricular systolic function is normal. The right ventricular  size is normal.   3. Left atrial size was mildly dilated.   4. The mitral valve is normal in structure. Mild mitral valve  regurgitation. No evidence of mitral stenosis.   5. The aortic valve is normal in structure. Aortic valve regurgitation is  not visualized. No aortic stenosis is present.   6. The inferior vena cava is normal in size with greater than 50%  respiratory variability, suggesting right atrial pressure of 3 mmHg.     Patient Profile     71 y.o. female nonobstructive CAD, HTN, HLD, OSA (untreated), obesity, GERD,  AFib admitted for Tikosyn  Assessment & Plan    Persistent AFib CHA2DS2Vasc is 3, on Eliquis, appropriately dosed Tikosyn load is in progress Labs are pending QT has lengthened and have reduced her dose to 274mg, d/w patient Converted with drug  HTN Home meds  HLD Home regime  OSA Intolerant  of CPAP Placed on O2 last night, sats are good    For questions or updates, please contact CFranklinPlease consult [email or web address] for contact info under        Signed, RBaldwin Jamaica PA-C  07/09/2022, 8:12 AM

## 2022-07-09 NOTE — Progress Notes (Signed)
Post dose EKG is reviewed SR 66bpm Manually measured QT 480m, QTc 5057m Improved   SR on telemetry without ectopy or arrhythmia  I discussed my measurements/EKG with Dr. LaQuentin Oreill keep tonight's dose 25011m  RenTommye StandardA-C

## 2022-07-09 NOTE — Progress Notes (Signed)
Pharmacy: Dofetilide (Tikosyn) - Follow Up Assessment and Electrolyte Replacement  Pharmacy consulted to assist in monitoring and replacing electrolytes in this 71 y.o. female admitted on 07/08/2022 undergoing dofetilide initiation.   Labs:    Component Value Date/Time   K 3.8 07/09/2022 0653   K 3.7 04/10/2012 0117   MG 2.1 07/09/2022 0653   MG 1.8 04/10/2012 0117     Plan: Potassium: -K 51mq given  Magnesium: Mg > 2: No additional supplementation needed    Thank you for allowing pharmacy to participate in this patient's care  AHildred Laser PharmD Clinical Pharmacist **Pharmacist phone directory can now be found on [email or web address] ([id number]).  Listed under [place]

## 2022-07-09 NOTE — Plan of Care (Signed)
  Problem: Education: Goal: Knowledge of General Education information will improve Description: Including pain rating scale, medication(s)/side effects and non-pharmacologic comfort measures Outcome: Progressing   Problem: Health Behavior/Discharge Planning: Goal: Ability to manage health-related needs will improve Outcome: Progressing    Problem: Clinical Measurements: Goal: Cardiovascular complication will be avoided Outcome: Progressing   Problem: Activity: Goal: Risk for activity intolerance will decrease Outcome: Progressing    Problem: Coping: Goal: Level of anxiety will decrease Outcome: Progressing   Problem: Safety: Goal: Ability to remain free from injury will improve Outcome: Progressing

## 2022-07-10 LAB — BASIC METABOLIC PANEL
Anion gap: 11 (ref 5–15)
BUN: 14 mg/dL (ref 8–23)
CO2: 25 mmol/L (ref 22–32)
Calcium: 8.6 mg/dL — ABNORMAL LOW (ref 8.9–10.3)
Chloride: 104 mmol/L (ref 98–111)
Creatinine, Ser: 1.03 mg/dL — ABNORMAL HIGH (ref 0.44–1.00)
GFR, Estimated: 58 mL/min — ABNORMAL LOW (ref >60)
Glucose, Bld: 99 mg/dL (ref 70–99)
Potassium: 3.8 mmol/L (ref 3.5–5.1)
Sodium: 140 mmol/L (ref 135–145)

## 2022-07-10 LAB — MAGNESIUM: Magnesium: 2 mg/dL (ref 1.7–2.4)

## 2022-07-10 MED ORDER — DOFETILIDE 125 MCG PO CAPS
125.0000 ug | ORAL_CAPSULE | Freq: Two times a day (BID) | ORAL | Status: DC
  Administered 2022-07-10 – 2022-07-11 (×3): 125 ug via ORAL
  Filled 2022-07-10 (×5): qty 1

## 2022-07-10 MED ORDER — POTASSIUM CHLORIDE CRYS ER 20 MEQ PO TBCR
40.0000 meq | EXTENDED_RELEASE_TABLET | Freq: Once | ORAL | Status: AC
Start: 2022-07-10 — End: 2022-07-10
  Administered 2022-07-10: 40 meq via ORAL
  Filled 2022-07-10: qty 2

## 2022-07-10 MED ORDER — POTASSIUM CHLORIDE CRYS ER 20 MEQ PO TBCR
20.0000 meq | EXTENDED_RELEASE_TABLET | Freq: Every day | ORAL | Status: DC
  Administered 2022-07-10 – 2022-07-12 (×3): 20 meq via ORAL
  Filled 2022-07-10 (×3): qty 1
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
## 2022-07-10 NOTE — Progress Notes (Signed)
Pharmacy: Dofetilide (Tikosyn) - Follow Up Assessment and Electrolyte Replacement  Pharmacy consulted to assist in monitoring and replacing electrolytes in this 71 y.o. female admitted on 07/08/2022 undergoing dofetilide initiation. First dofetilide dose: 539mg BID. Decreased to 2565m BID on 8/30 due to Qtc prolongation. Decreased to 12533mBID on 8/31 as Qtc continues to be too long. Held AM dose. Will continue PM. Converted sinus rhythm.   Labs:    Component Value Date/Time   K 3.8 07/10/2022 0301   K 3.7 04/10/2012 0117   MG 2.0 07/10/2022 0301   MG 1.8 04/10/2012 0117     Plan: Potassium: K 3.8: Give KCl 74m64mo x 1; then KCL 20 mEq po daily  Magnesium: Mg 2: continued Mg PTA dose.  Patient has required on average 40mE27m potassium replacement every day. Due to K level consistently being 3.8 on past two KCL 40 mEq doses, recommend increasing to KCL 74mEq33mx 1; then KCL 20 mEq po daily thereafter  Thank you for allowing pharmacy to participate in this patient's care   Tyus Kallam IGala [email or web address]  9:38 AM

## 2022-07-10 NOTE — Care Management (Signed)
1710 07-10-22 Case Manager spoke with the patient regarding Tikosyn cost. Patient is agreeable to cost. Plan will be to discharge on Saturday and patient uses [facility]. Case Manager did call [facility] to see if the pharmacy has 125 mcg in stock and they do not. Case Manager called [facility] and they do not have in stock. Case Manager called [facility] they do not have the medication in stock; however the pharmacist Brayton Layman did try to order for Saturday pick up. Monica called back and stated that the order has been acknowledged and medication should be available to order. [facility] will close @ 1600 on Saturday. Initial Rx can be sent to [facility] and Rx refills for 30 day supply escribed to [place], [place]. No further needs identified at this time.

## 2022-07-10 NOTE — Progress Notes (Signed)
Rounding Note    Patient Name: Kayla Hendrix Date of Encounter: 07/10/2022  [facility] EP/Cardiologist: Dr. Curt Bears  Subjective   No CP, SOB, anxious about her rhythm management  Inpatient Medications    Scheduled Meds:  apixaban  5 mg Oral BID   diltiazem  360 mg Oral Daily   dofetilide  125 mcg Oral BID   famotidine  20 mg Oral BID   furosemide  40 mg Oral Daily   gabapentin  300 mg Oral BID   magnesium oxide  400 mg Oral Daily   metFORMIN  1,000 mg Oral Q supper   metoprolol tartrate  25 mg Oral BID   potassium chloride  40 mEq Oral Once   sodium chloride flush  3 mL Intravenous Q12H   Continuous Infusions:  sodium chloride     PRN Meds: sodium chloride, acetaminophen, clonazePAM, sodium chloride flush   Vital Signs    Vitals:   07/09/22 0836 07/09/22 1259 07/09/22 1957 07/10/22 0457  BP: 139/63 127/64 123/64 133/61  Pulse: 77 66 75 68  Resp: '15 15 16 16  '$ Temp: 98.6 F (37 C) 98.5 F (36.9 C) 98.6 F (37 C) 98.7 F (37.1 C)  TempSrc: Oral Oral Oral Oral  SpO2: 96% 98%    Weight:      Height:        Intake/Output Summary (Last 24 hours) at 07/10/2022 0738 Last data filed at 07/09/2022 2015 Gross per 24 hour  Intake 240 ml  Output --  Net 240 ml      07/08/2022    1:09 PM 07/08/2022    9:42 AM 05/20/2022    3:39 PM  Last 3 Weights  Weight (lbs) 210 lb 3.2 oz 210 lb 3.2 oz 209 lb 12.8 oz  Weight (kg) 95.346 kg 95.346 kg 95.165 kg      Telemetry    SR 60's  - Personally Reviewed  ECG    SR 68bpm, QTc agreed with machine read is 521m- Personally Reviewed with Dr. LQuentin Ore Physical Exam   No change in exam today GEN: No acute distress.   Neck: No JVD Cardiac: RRR, no murmurs, rubs, or gallops.  Respiratory: CTA b/l. GI: Soft, nontender, non-distended  MS: No edema; No deformity. Neuro:  Nonfocal  Psych: Normal affect   Labs    High Sensitivity Troponin:  No results for input(s): "TROPONINIHS" in the last 720 hours.    Chemistry Recent Labs  Lab 07/08/22 1514 07/09/22 0653 07/10/22 0301  NA 139 140 140  K 3.9 3.8 3.8  CL 103 103 104  CO2 '28 26 25  '$ GLUCOSE 99 92 99  BUN '13 11 14  '$ CREATININE 1.05* 1.03* 1.03*  CALCIUM 9.2 8.8* 8.6*  MG 1.8 2.1 2.0  GFRNONAA 57* 58* 58*  ANIONGAP '8 11 11    '$ Lipids No results for input(s): "CHOL", "TRIG", "HDL", "LABVLDL", "LDLCALC", "CHOLHDL" in the last 168 hours.  HematologyNo results for input(s): "WBC", "RBC", "HGB", "HCT", "MCV", "MCH", "MCHC", "RDW", "PLT" in the last 168 hours. Thyroid No results for input(s): "TSH", "FREET4" in the last 168 hours.  BNPNo results for input(s): "BNP", "PROBNP" in the last 168 hours.  DDimer No results for input(s): "DDIMER" in the last 168 hours.   Radiology    No results found.  Cardiac Studies   TTE 09/24/20   1. Left ventricular ejection fraction, by estimation, is 60 to 65%. The  left ventricle has normal function. The left ventricle has  no regional  wall motion abnormalities. There is mild concentric left ventricular  hypertrophy. Left ventricular diastolic  function could not be evaluated.   2. Right ventricular systolic function is normal. The right ventricular  size is normal.   3. Left atrial size was mildly dilated.   4. The mitral valve is normal in structure. Mild mitral valve  regurgitation. No evidence of mitral stenosis.   5. The aortic valve is normal in structure. Aortic valve regurgitation is  not visualized. No aortic stenosis is present.   6. The inferior vena cava is normal in size with greater than 50%  respiratory variability, suggesting right atrial pressure of 3 mmHg.     Patient Profile     71 y.o. female nonobstructive CAD, HTN, HLD, OSA (untreated), obesity, GERD,  AFib admitted for Tikosyn  Assessment & Plan    Persistent AFib CHA2DS2Vasc is 3, on Eliquis, appropriately dosed Tikosyn load is in progress  K+ 3.8 Mag 2.0 Creat 1.03 QTc remains a bit too long Converted with  drug  No dose this AM >> 153mg tonight Will prolong her stay to Saturday, d/w the patient and her son at bedside, she is agreeable   HTN Home meds  HLD Home regime  OSA Intolerant of CPAP     For questions or updates, please contact CC[place]lewPlease consult [email or web address] for contact info under        Signed, RBaldwin Jamaica PA-C  07/10/2022, 7:38 AM

## 2022-07-11 LAB — BASIC METABOLIC PANEL
Anion gap: 10 (ref 5–15)
BUN: 14 mg/dL (ref 8–23)
CO2: 26 mmol/L (ref 22–32)
Calcium: 9 mg/dL (ref 8.9–10.3)
Chloride: 102 mmol/L (ref 98–111)
Creatinine, Ser: 1.04 mg/dL — ABNORMAL HIGH (ref 0.44–1.00)
GFR, Estimated: 58 mL/min — ABNORMAL LOW (ref >60)
Glucose, Bld: 97 mg/dL (ref 70–99)
Potassium: 4.3 mmol/L (ref 3.5–5.1)
Sodium: 138 mmol/L (ref 135–145)

## 2022-07-11 LAB — MAGNESIUM: Magnesium: 2.1 mg/dL (ref 1.7–2.4)

## 2022-07-11 NOTE — Progress Notes (Signed)
Post dose EKG is reviewed with Dr. Quentin Ore QT is improved and acceptable Anticipate home tomorrow Appreciate care management assistance  [facility] outpatient pharmacy has ordered drug and will be available for fill tomorrow (closes at 1600). The patient is aware that her 1st fill will be there and can transfer to her preferred pharmacy if needed afterwards for ordering and future fills.  I have completed Tikosyn teaching with the patient. Usual follow up is arranged  Tommye Standard, PA-C

## 2022-07-11 NOTE — Progress Notes (Signed)
Rounding Note    Patient Name: Kayla Hendrix Date of Encounter: 07/11/2022  [facility] EP/Cardiologist: Dr. Curt Bears  Subjective   No CP, SOB  Inpatient Medications    Scheduled Meds:  apixaban  5 mg Oral BID   diltiazem  360 mg Oral Daily   dofetilide  125 mcg Oral BID   famotidine  20 mg Oral BID   furosemide  40 mg Oral Daily   gabapentin  300 mg Oral BID   magnesium oxide  400 mg Oral Daily   metFORMIN  1,000 mg Oral Q supper   metoprolol tartrate  25 mg Oral BID   potassium chloride  20 mEq Oral Daily   sodium chloride flush  3 mL Intravenous Q12H   Continuous Infusions:  sodium chloride     PRN Meds: sodium chloride, acetaminophen, clonazePAM, sodium chloride flush   Vital Signs    Vitals:   07/10/22 0841 07/10/22 1500 07/10/22 2018 07/11/22 0548  BP: (!) 130/57 111/61 132/62 (!) 127/58  Pulse: 64 63  66  Resp:  20  18  Temp:  98.5 F (36.9 C) 97.7 F (36.5 C) 97.8 F (36.6 C)  TempSrc:  Oral Oral Oral  SpO2:  96%  97%  Weight:      Height:        Intake/Output Summary (Last 24 hours) at 07/11/2022 0738 Last data filed at 07/10/2022 2015 Gross per 24 hour  Intake 240 ml  Output --  Net 240 ml      07/08/2022    1:09 PM 07/08/2022    9:42 AM 05/20/2022    3:39 PM  Last 3 Weights  Weight (lbs) 210 lb 3.2 oz 210 lb 3.2 oz 209 lb 12.8 oz  Weight (kg) 95.346 kg 95.346 kg 95.165 kg      Telemetry    SR 60's  - Personally Reviewed  ECG    SR 65bpm, QTc borderline 500-572m- Personally Reviewed with Dr. LQuentin Ore Physical Exam   No change in exam today GEN: No acute distress.   Neck: No JVD Cardiac: RRR, no murmurs, rubs, or gallops.  Respiratory: CTA b/l. GI: Soft, nontender, non-distended  MS: No edema; No deformity. Neuro:  Nonfocal  Psych: Normal affect   Labs    High Sensitivity Troponin:  No results for input(s): "TROPONINIHS" in the last 720 hours.   Chemistry Recent Labs  Lab 07/09/22 0653 07/10/22 0301  07/11/22 0453  NA 140 140 138  K 3.8 3.8 4.3  CL 103 104 102  CO2 '26 25 26  '$ GLUCOSE 92 99 97  BUN '11 14 14  '$ CREATININE 1.03* 1.03* 1.04*  CALCIUM 8.8* 8.6* 9.0  MG 2.1 2.0 2.1  GFRNONAA 58* 58* 58*  ANIONGAP '11 11 10    '$ Lipids No results for input(s): "CHOL", "TRIG", "HDL", "LABVLDL", "LDLCALC", "CHOLHDL" in the last 168 hours.  HematologyNo results for input(s): "WBC", "RBC", "HGB", "HCT", "MCV", "MCH", "MCHC", "RDW", "PLT" in the last 168 hours. Thyroid No results for input(s): "TSH", "FREET4" in the last 168 hours.  BNPNo results for input(s): "BNP", "PROBNP" in the last 168 hours.  DDimer No results for input(s): "DDIMER" in the last 168 hours.   Radiology    No results found.  Cardiac Studies   TTE 09/24/20   1. Left ventricular ejection fraction, by estimation, is 60 to 65%. The  left ventricle has normal function. The left ventricle has no regional  wall motion abnormalities. There is mild concentric  left ventricular  hypertrophy. Left ventricular diastolic  function could not be evaluated.   2. Right ventricular systolic function is normal. The right ventricular  size is normal.   3. Left atrial size was mildly dilated.   4. The mitral valve is normal in structure. Mild mitral valve  regurgitation. No evidence of mitral stenosis.   5. The aortic valve is normal in structure. Aortic valve regurgitation is  not visualized. No aortic stenosis is present.   6. The inferior vena cava is normal in size with greater than 50%  respiratory variability, suggesting right atrial pressure of 3 mmHg.     Patient Profile     71 y.o. female nonobstructive CAD, HTN, HLD, OSA (untreated), obesity, GERD,  AFib admitted for Tikosyn  Assessment & Plan    Persistent AFib CHA2DS2Vasc is 3, on Eliquis, appropriately dosed Tikosyn load is in progress  K+ 4.3 Mag 2.1 Creat 1.04 QTc borderline Converted with drug  She has poorly tolerated amiodarone previously with  nausea/diarrhea. Dr. Quentin Ore discussed with the patient, some concerns she will not tolerate tikosyn  Will revisit EKG this AM   HTN Home meds  HLD Home regime  OSA Intolerant of CPAP     For questions or updates, please contact [place] Please consult [email or web address] for contact info under        Signed, Baldwin Jamaica, PA-C  07/11/2022, 7:38 AM

## 2022-07-12 DIAGNOSIS — G4733 Obstructive sleep apnea (adult) (pediatric): Secondary | ICD-10-CM
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
## 2022-07-12 NOTE — TOC Progression Note (Signed)
Transition of Care [facility]) - Progression Note    Patient Details  Name: Kayla Hendrix MRN: [id number] Date of Birth: 02-22-1951  Transition of Care [facility]) CM/SW Contact  Konrad Penta, RN Phone Number: [phone] 07/12/2022, 11:52 AM  Clinical Narrative:   Chart reviewed. Ms. Dimascio will not go home on Tikosyn after all. Failed Tikosyn load. Confirmed with treatment team. Spoke with Ms. Cuttino. She is aware of her follow up appointments on AVS. Denies having any TOC needs at this time. Has ride home.        Barriers to Discharge: No Barriers Identified  Expected Discharge Plan and Services           Expected Discharge Date: 07/12/22                                     Social Determinants of Health (SDOH) Interventions    Readmission Risk Interventions     No data to display

## 2022-07-12 NOTE — Plan of Care (Signed)

## 2022-07-12 NOTE — Progress Notes (Signed)
Rounding Note    Patient Name: Kayla Hendrix Date of Encounter: 07/12/2022  [facility] EP/Cardiologist: Dr. Curt Bears  Subjective   No CP, SOB  Inpatient Medications    Scheduled Meds:  apixaban  5 mg Oral BID   diltiazem  360 mg Oral Daily   dofetilide  125 mcg Oral BID   famotidine  20 mg Oral BID   furosemide  40 mg Oral Daily   gabapentin  300 mg Oral BID   magnesium oxide  400 mg Oral Daily   metFORMIN  1,000 mg Oral Q supper   metoprolol tartrate  25 mg Oral BID   potassium chloride  20 mEq Oral Daily   sodium chloride flush  3 mL Intravenous Q12H   Continuous Infusions:  sodium chloride     PRN Meds: sodium chloride, acetaminophen, clonazePAM, sodium chloride flush   Vital Signs    Vitals:   07/11/22 1839 07/11/22 2011 07/12/22 0648 07/12/22 1000  BP: 128/61 122/62 (!) 128/56 (!) 123/58  Pulse: 68  75   Resp: '18  18 18  '$ Temp: 97.8 F (36.6 C) 97.8 F (36.6 C) 98.8 F (37.1 C) 98.6 F (37 C)  TempSrc: Oral Oral Oral Oral  SpO2:   98%   Weight:      Height:        Intake/Output Summary (Last 24 hours) at 07/12/2022 1037 Last data filed at 07/11/2022 2015 Gross per 24 hour  Intake 240 ml  Output --  Net 240 ml       07/08/2022    1:09 PM 07/08/2022    9:42 AM 05/20/2022    3:39 PM  Last 3 Weights  Weight (lbs) 210 lb 3.2 oz 210 lb 3.2 oz 209 lb 12.8 oz  Weight (kg) 95.346 kg 95.346 kg 95.165 kg      Telemetry    SR 60's  - Personally Reviewed  ECG    SR  QT still ( or again) over 500 msec  Physical Exam   Well developed and nourished in no acute distress HENT normal Neck supple with JVP-  flat  Clear Regular rate and rhythm, no murmurs or gallops Abd-soft with active BS No Clubbing cyanosis edema Skin-warm and dry A & Oriented  Grossly normal sensory and motor function     Labs    High Sensitivity Troponin:  No results for input(s): "TROPONINIHS" in the last 720 hours.   Chemistry Recent Labs  Lab  07/09/22 0653 07/10/22 0301 07/11/22 0453  NA 140 140 138  K 3.8 3.8 4.3  CL 103 104 102  CO2 '26 25 26  '$ GLUCOSE 92 99 97  BUN '11 14 14  '$ CREATININE 1.03* 1.03* 1.04*  CALCIUM 8.8* 8.6* 9.0  MG 2.1 2.0 2.1  GFRNONAA 58* 58* 58*  ANIONGAP '11 11 10     '$ Lipids No results for input(s): "CHOL", "TRIG", "HDL", "LABVLDL", "LDLCALC", "CHOLHDL" in the last 168 hours.  HematologyNo results for input(s): "WBC", "RBC", "HGB", "HCT", "MCV", "MCH", "MCHC", "RDW", "PLT" in the last 168 hours. Thyroid No results for input(s): "TSH", "FREET4" in the last 168 hours.  BNPNo results for input(s): "BNP", "PROBNP" in the last 168 hours.  DDimer No results for input(s): "DDIMER" in the last 168 hours.   Radiology    No results found.  Cardiac Studies   TTE 09/24/20   1. Left ventricular ejection fraction, by estimation, is 60 to 65%. The  left ventricle has normal function. The left  ventricle has no regional  wall motion abnormalities. There is mild concentric left ventricular  hypertrophy. Left ventricular diastolic  function could not be evaluated.   2. Right ventricular systolic function is normal. The right ventricular  size is normal.   3. Left atrial size was mildly dilated.   4. The mitral valve is normal in structure. Mild mitral valve  regurgitation. No evidence of mitral stenosis.   5. The aortic valve is normal in structure. Aortic valve regurgitation is  not visualized. No aortic stenosis is present.   6. The inferior vena cava is normal in size with greater than 50%  respiratory variability, suggesting right atrial pressure of 3 mmHg.     Patient Profile     71 y.o. female nonobstructive CAD, HTN, HLD, OSA (untreated), obesity, GERD,  AFib admitted for Tikosyn  Assessment & Plan    Persistent AFib QTc is excessively prolonged.  She has an appointment to see [facility] to consider convergent ablation 9/11.   CHA2DS2Vasc is 3, on Eliquis, appropriately dosed    K+ 4.3 Mag  2.1 Creat 1.04 QTc borderline Converted with drug     HTN Home meds  HLD Home regime  OSA moderate-severe Intolerant of CPAP  Will discharge off dofetilide Encouraged her to follow-up with [facility] to consider convergent ablation She asked about continuing on dofetilide, stressed that this is not could be possible  Reviewed her sleep study reports, Dr. Theodosia Blender report and what I read are divergent with the AHI in the former at 71 and the latter about 30.  I reached out to her for clarification; the patient has been referred for inspire.  Recent review in HEART RHYTHM outlines the lack of data that intervening on sleep apnea will have an impact on atrial fibrillation   With her OSA being severe however her fatigue may be improved.       Signed, Virl Axe, MD  07/12/2022, 10:37 AM

## 2022-07-17 ENCOUNTER — Other Ambulatory Visit (HOSPITAL_COMMUNITY): Payer: Self-pay | Admitting: *Deleted

## 2022-07-17 DIAGNOSIS — I4819 Other persistent atrial fibrillation: Secondary | ICD-10-CM

## 2022-07-22 ENCOUNTER — Ambulatory Visit (HOSPITAL_COMMUNITY): Payer: BC Managed Care – PPO | Admitting: Nurse Practitioner

## 2022-07-23 ENCOUNTER — Telehealth: Payer: Self-pay

## 2022-07-23 NOTE — Patient Outreach (Signed)
  Care Coordination   07/23/2022 Name: Kayla Hendrix MRN: [id number] DOB: 1951-01-21   Care Coordination Outreach Attempts:  An unsuccessful telephone outreach was attempted for a scheduled appointment today. Patient reports she is still at work and unable to talk at this time.  Follow Up Plan:  Additional outreach attempts will be made to offer the patient care coordination information and services.   Encounter Outcome:  Pt. Request to Call Back  Care Coordination Interventions Activated:  No   Care Coordination Interventions:  No, not indicated    Daneen Schick, BSW, CDP Social Worker, Certified Dementia Practitioner [facility]  Care Coordination [phone]

## 2022-07-25 DIAGNOSIS — I4891 Unspecified atrial fibrillation: Secondary | ICD-10-CM | POA: Diagnosis not present

## 2022-07-25 DIAGNOSIS — I1310 Hypertensive heart and chronic kidney disease without heart failure, with stage 1 through stage 4 chronic kidney disease, or unspecified chronic kidney disease: Secondary | ICD-10-CM | POA: Diagnosis not present

## 2022-07-31 ENCOUNTER — Telehealth: Payer: Self-pay | Admitting: *Deleted

## 2022-07-31 NOTE — Chronic Care Management (AMB) (Signed)
  Care Coordination   Note   07/31/2022 Name: Kayla Hendrix MRN: [id number] DOB: [birth year]  Kayla Hendrix is a 71 y.o. year old female who sees Wile, Jesse Sans, MD for primary care. I reached out to Samantha Crimes by phone today to reschedule missed call with [place]  care coordination services.   Follow up plan:  A second Unsuccessful telephone outreach attempt made. A HIPAA compliant phone message was left for the patient providing contact information and requesting a return call.  Encounter Outcome:  No Answer  [place]  Direct Dial: [phone]

## 2022-08-04 NOTE — Chronic Care Management (AMB) (Signed)
  Care Coordination  Outreach Note  08/04/2022 Name: Kayla Hendrix MRN: [id number] DOB: 1951-01-20   Care Coordination Outreach Attempts: A third unsuccessful outreach was attempted today to offer the patient with information about available care coordination services as a benefit of their health plan.   Follow Up Plan:  No further outreach attempts will be made at this time. We have been unable to contact the patient to offer or enroll patient in care coordination services  Encounter Outcome:  No Answer No further outreach attempts will be made at this time. We have been unable to contact the patient to reschedule follow up with SW for patient in care coordination services.  [place]  Direct Dial: [phone]

## 2022-08-10 ENCOUNTER — Other Ambulatory Visit: Payer: Self-pay | Admitting: Cardiology

## 2022-08-11 ENCOUNTER — Ambulatory Visit: Payer: BC Managed Care – PPO | Admitting: Physician Assistant

## 2022-08-13 NOTE — Progress Notes (Signed)
[address]       [place] [address]             [phone]        Kayla Hendrix [place] Medical Record #[id number] Date of Birth: June 22, 1951  Primary Care Physician: [name], MD EP MDs:  Dr. Rayann Heman Dr Curt Bears Dr. Quentin Ore   Chief Complaint:   Fatigue   History of Present Illness:       Kayla Hendrix is a 71 y.o. female with a h/o Afib since 1999. She has had 8-9 cardioversions and 2 catheter ablations (2022, 2023). She was admitted in September for tikosyn initiation.  She has been working 6 days a week. Stress on and off. She has been considering going to [facility] to discuss convergent. Heard that we were doing convergent here, meets with [place] today.  In terms of symptoms, she has fatigue. Reports compliance with meds. She reports her rate being higher recently, more 110s and more tired in past several months.  After her first catheter ablation in 2022 she was free from AF for 5-6 months, after another ablation in 2023 she was back in AF within 2-3 weeks.   She recently started on Ozempic. She reports multiple side effects from anti-arrythmic medications in the past.   She has not had a stroke or TIA. Her mother had several strokes, including a severe one in her 72s, due to carotid issues per patient.     Past Medical and Surgical History: Previous Chest Surgery: No Previous Chest Radiation: No Diabetes Mellitus: No   Creatinine: 1.0  Past Medical History:  Diagnosis Date   Allergy    Anemia    Anxiety    Atrial fibrillation (HCC)    paroxysmal only asa 81 mg, no other blood thinner, the xarelto she was on gave her HAs   Barrett's esophagus 2018   CAD (coronary artery disease)    cath 2006- nonobstructive CAD   Cataract    Depression    Diabetes mellitus without complication (HCC)    Gastric polyp    GERD (gastroesophageal reflux disease)    Glucose intolerance (impaired glucose tolerance)    Hiatal hernia    History of  transesophageal echocardiography (TEE) for monitoring    TEE 3/17:  EF 55%, no RWMA, mild plaque in descending aorta, mild MR, mod BAE, normal RVF   HL (hearing loss)    HTN (hypertension)    Hyperlipidemia    Hyperplastic colon polyp    Internal hemorrhoids    Intestinal metaplasia of gastric mucosa    Obesity    OSA (obstructive sleep apnea)    noncompliant with CPAP   Sleep apnea    Tubular adenoma of colon     Past Surgical History:  Procedure Laterality Date   ATRIAL FIBRILLATION ABLATION N/A 11/15/2020   Procedure: ATRIAL FIBRILLATION ABLATION;  Surgeon: Thompson Grayer, MD;  Location: [place] [facility];  Service: Cardiovascular;  Laterality: N/A;   ATRIAL FIBRILLATION ABLATION N/A 01/09/2022   Procedure: ATRIAL FIBRILLATION ABLATION;  Surgeon: Constance Haw, MD;  Location: [place] [facility];  Service: Cardiovascular;  Laterality: N/A;   BIOPSY  10/17/2019   Procedure: BIOPSY;  Surgeon: Jerene Bears, MD;  Location: [facility];  Service: Gastroenterology;;   CARDIOVERSION N/A 01/23/2016   Procedure: CARDIOVERSION;  Surgeon: Larey Dresser, MD;  Location: [place];  Service: Cardiovascular;  Laterality: N/A;   CARDIOVERSION N/A 12/07/2019   Procedure:  CARDIOVERSION;  Surgeon: Pixie Casino, MD;  Location: [place];  Service: Cardiovascular;  Laterality: N/A;   CARDIOVERSION N/A 08/15/2020   Procedure: CARDIOVERSION;  Surgeon: Werner Lean, MD;  Location: [place];  Service: Cardiovascular;  Laterality: N/A;   CARDIOVERSION N/A 12/27/2020   Procedure: CARDIOVERSION;  Surgeon: Jerline Pain, MD;  Location: [facility] ENDOSCOPY;  Service: Cardiovascular;  Laterality: N/A;   CARDIOVERSION N/A 08/21/2021   Procedure: CARDIOVERSION;  Surgeon: Pixie Casino, MD;  Location: [place];  Service: Cardiovascular;  Laterality: N/A;   CARDIOVERSION N/A 03/03/2022   Procedure: CARDIOVERSION;  Surgeon: [name], MD;  Location: [facility] ENDOSCOPY;  Service:  Cardiovascular;  Laterality: N/A;   CESAREAN SECTION     ESOPHAGOGASTRODUODENOSCOPY (EGD) WITH PROPOFOL N/A 10/17/2019   Procedure: ESOPHAGOGASTRODUODENOSCOPY (EGD) WITH PROPOFOL;  Surgeon: Jerene Bears, MD;  Location: [facility] ENDOSCOPY;  Service: Gastroenterology;  Laterality: N/A;   GANGLION CYST EXCISION Left 1970s   HEMOSTASIS CLIP PLACEMENT  10/17/2019   Procedure: HEMOSTASIS CLIP PLACEMENT;  Surgeon: Jerene Bears, MD;  Location: [facility] ENDOSCOPY;  Service: Gastroenterology;;   POLYPECTOMY  10/17/2019   Procedure: POLYPECTOMY;  Surgeon: Jerene Bears, MD;  Location: [facility]OS[facility];  Service: Gastroenterology;;   TEE WITHOUT CARDIOVERSION N/A 01/23/2016   Procedure: TRANSESOPHAGEAL ECHOCARDIOGRAM (TEE);  Surgeon: Larey Dresser, MD;  Location: [place];  Service: Cardiovascular;  Laterality: N/A;   TEE WITHOUT CARDIOVERSION N/A 01/09/2022   Procedure: TRANSESOPHAGEAL ECHOCARDIOGRAM (TEE);  Surgeon: Constance Haw, MD;  Location: [place] [facility];  Service: Cardiovascular;  Laterality: N/A;   TOTAL ABDOMINAL HYSTERECTOMY     WRIST FRACTURE SURGERY  2010   wrist fracture repair with metal rod    Social History:  Social History   Tobacco Use  Smoking Status Former   Types: Cigarettes   Quit date: 2010   Years since quitting: 13.7  Smokeless Tobacco Never  Tobacco Comments   15-pack-year smoker    Social History   Substance and Sexual Activity  Alcohol Use No   Comment: rarely     Allergies  Allergen Reactions   Amiodarone Diarrhea and Nausea Only   Hydrocodone Itching    Medications:   Current Outpatient Medications  Medication Sig Dispense Refill   acetaminophen (TYLENOL) 500 MG tablet Take 1,000-1,500 mg by mouth daily as needed for mild pain or headache.     atorvastatin (LIPITOR) 40 MG tablet Take 40 mg by mouth 2 (two) times a week.     clonazePAM (KLONOPIN) 0.5 MG tablet Take 0.5 mg by mouth 2 (two) times daily as needed for anxiety.     diltiazem (TIAZAC)  360 MG 24 hr capsule Take 360 mg by mouth daily.     ELIQUIS 5 MG TABS tablet TAKE 1 TABLET BY MOUTH TWICE A DAY 180 tablet 1   Ferrous Sulfate (IRON) 325 (65 Fe) MG TABS Take 325 mg by mouth 2 (two) times a week.     furosemide (LASIX) 20 MG tablet TAKE 1 & 1/2 TABLET BY MOUTH DAILY FOR FLUID 135 tablet 2   gabapentin (NEURONTIN) 300 MG capsule Take 300 mg by mouth 2 (two) times daily.     glucose blood (ACCU-CHEK AVIVA PLUS) test strip CHECK BLOOD SUGAR TWICE DAILY AS DIRECTED     metFORMIN (GLUCOPHAGE-XR) 500 MG 24 hr tablet Take 1,000 mg by mouth at bedtime.     metoprolol tartrate (LOPRESSOR) 50 MG tablet TAKE 1 TABLET BY MOUTH TWICE A DAY 180 tablet 3  OZEMPIC, 2 MG/DOSE, 8 MG/3ML SOPN Inject 2 mg into the skin once a week.     No current facility-administered medications for this visit.    (Not in a hospital admission)   Family History  Problem Relation Age of Onset   Cancer Father    Stomach cancer Father    Diabetes Other        noninsulin dependant DM   Hypertension Other    Stomach cancer Maternal Grandmother    Colon cancer Neg Hx    Colon polyps Neg Hx    Esophageal cancer Neg Hx    Rectal cancer Neg Hx      Review of Systems:   ROS 14 point ROS negative except as per above   Physical Exam: There were no vitals taken for this visit. Physical Exam  Gen NAD Resp nonlaboured CV irreg irreg rate 100s Neuro grossly nonfocal Extr [facility]    Diagnostic Studies & Laboratory data:    Left Heart Catherization:   Cath 2006- CONCLUSION:  1.  Well-preserved overall left ventricular function.  2.  Calcification of mid left anterior descending artery without critical    stenosis.   Echo: TTE 09/24/20   1. Left ventricular ejection fraction, by estimation, is 60 to 65%. The  left ventricle has normal function. The left ventricle has no regional  wall motion abnormalities. There is mild concentric left ventricular  hypertrophy. Left ventricular diastolic   function could not be evaluated.   2. Right ventricular systolic function is normal. The right ventricular  size is normal.   3. Left atrial size was mildly dilated.   4. The mitral valve is normal in structure. Mild mitral valve  regurgitation. No evidence of mitral stenosis.   5. The aortic valve is normal in structure. Aortic valve regurgitation is  not visualized. No aortic stenosis is present.   6. The inferior vena cava is normal in size with greater than 50%  respiratory variability, suggesting right atrial pressure of 3 mmHg.   I have independently reviewed the above radiologic studies and discussed with the patient   Recent Lab Findings: Lab Results  Component Value Date   WBC 11.0 (H) 02/24/2022   HGB 11.0 (L) 02/24/2022   HCT 35.7 (L) 02/24/2022   PLT 327 02/24/2022   GLUCOSE 97 07/11/2022   CHOL 159 04/10/2012   TRIG 230 (H) 04/10/2012   HDL 34 (L) 04/10/2012   LDLDIRECT 117.4 08/06/2009   LDLCALC 79 04/10/2012   ALT 28 04/08/2012   AST 33 04/08/2012   NA 138 07/11/2022   K 4.3 07/11/2022   CL 102 07/11/2022   CREATININE 1.04 (H) 07/11/2022   BUN 14 07/11/2022   CO2 26 07/11/2022   TSH 1.888 03/14/2020   INR 1.0 04/08/2012   HGBA1C 5.7 04/10/2012      Assessment / Plan:   71 y.o. female with persistent atrial fibrillation with a history of multiple cardioversions and 2 catheter ablations, recently admitted for Tikosyn. Co-morbidities include HTN, HLD, OSA, obesity, GERD. Cardiac hx of nonobstructive CAD and had normal EF and mild MR on echo in the past.  Plan is see back in clinic in Late November Epicardial ablation / LAA clip in December / January.   I told her we are getting started with Convergent here and she would be on of the initial patients. I offered her procedure to be later if our experience here if that's what she prefers. It seems as of now she wants to  get this done. She will return with her son for further discussion.   Typical workup  includes: CTA coronary for patients > 4 yo.  Echo in the last year For this patient in particular, additionally obtain carotid duplex (Mother at 67 yo with stoke and carotid surgery)   We broadly discussed the rationale for the convergent approach.  Standard recovery is walking around day after procedure, discharge to home postop day 2.  I see convergent (epicardial ablation) patients postoperatively back in the clinic in 1 week and 1 month. Patients generally have a full recovery by 3 weeks.   Risks/benefits/alternatives discussed at length. All questions were asked an answered. Risks are <<1% mortality, 2% morbidity (bleeding, infection, damage to surrounding structures) and >97% standard recovery.  Typically I do these procedures on Mondays, last dose of Eliquis is Saturday morning. I restart AC the night of surgery. If they receive an LAA clip, most patients, but not all, come off their AC 2-6 months after surgery at the discretion of their referring cardiologist. Many ultimately have reductions in the degree of antiarrythmic medications as well. Expected outcome is significantly more freedom from Afib (measured by 90% reduction of time in AF).   I spent 40 minutes counseling the patient face to face.   [name] Yavonne Kiss 08/13/2022 10:46 AM

## 2022-08-14 ENCOUNTER — Encounter: Payer: Self-pay | Admitting: Cardiothoracic Surgery

## 2022-08-14 ENCOUNTER — Institutional Professional Consult (permissible substitution) (INDEPENDENT_AMBULATORY_CARE_PROVIDER_SITE_OTHER): Payer: BC Managed Care – PPO | Admitting: Cardiothoracic Surgery

## 2022-08-14 VITALS — BP 117/72 | HR 116 | Resp 20 | Ht 66.0 in | Wt 208.7 lb

## 2022-08-14 DIAGNOSIS — E785 Hyperlipidemia, unspecified: Secondary | ICD-10-CM | POA: Diagnosis not present

## 2022-08-14 DIAGNOSIS — I4819 Other persistent atrial fibrillation: Secondary | ICD-10-CM

## 2022-08-14 DIAGNOSIS — Z79899 Other long term (current) drug therapy: Secondary | ICD-10-CM | POA: Diagnosis not present

## 2022-08-14 DIAGNOSIS — E1142 Type 2 diabetes mellitus with diabetic polyneuropathy: Secondary | ICD-10-CM | POA: Diagnosis not present

## 2022-08-20 DIAGNOSIS — Z23 Encounter for immunization: Secondary | ICD-10-CM | POA: Diagnosis not present

## 2022-08-20 DIAGNOSIS — Z1331 Encounter for screening for depression: Secondary | ICD-10-CM | POA: Diagnosis not present

## 2022-08-20 DIAGNOSIS — Z0000 Encounter for general adult medical examination without abnormal findings: Secondary | ICD-10-CM | POA: Diagnosis not present

## 2022-08-20 DIAGNOSIS — Z1339 Encounter for screening examination for other mental health and behavioral disorders: Secondary | ICD-10-CM | POA: Diagnosis not present

## 2022-08-20 DIAGNOSIS — E1142 Type 2 diabetes mellitus with diabetic polyneuropathy: Secondary | ICD-10-CM | POA: Diagnosis not present

## 2022-08-20 DIAGNOSIS — M25559 Pain in unspecified hip: Secondary | ICD-10-CM | POA: Diagnosis not present

## 2022-08-29 DIAGNOSIS — D044 Carcinoma in situ of skin of scalp and neck: Secondary | ICD-10-CM | POA: Diagnosis not present

## 2022-09-25 ENCOUNTER — Encounter: Payer: Self-pay | Admitting: *Deleted

## 2022-09-25 ENCOUNTER — Other Ambulatory Visit: Payer: Self-pay | Admitting: Thoracic Surgery (Cardiothoracic Vascular Surgery)

## 2022-09-25 ENCOUNTER — Other Ambulatory Visit: Payer: Self-pay | Admitting: *Deleted

## 2022-09-25 ENCOUNTER — Other Ambulatory Visit: Payer: Self-pay | Admitting: Cardiothoracic Surgery

## 2022-09-25 ENCOUNTER — Ambulatory Visit (INDEPENDENT_AMBULATORY_CARE_PROVIDER_SITE_OTHER): Payer: BC Managed Care – PPO | Admitting: Cardiothoracic Surgery

## 2022-09-25 VITALS — BP 120/65 | HR 108 | Resp 20 | Ht 66.0 in | Wt 208.0 lb

## 2022-09-25 DIAGNOSIS — I4819 Other persistent atrial fibrillation: Secondary | ICD-10-CM

## 2022-09-25 NOTE — Anesthesia Preprocedure Evaluation (Addendum)
Anesthesia Evaluation  Patient identified by MRN, date of birth, ID band Patient awake    Reviewed: Allergy & Precautions, NPO status , Patient's Chart, lab work & pertinent test results  Airway Mallampati: II  TM Distance: >3 FB Neck ROM: Full    Dental  (+) Dental Advisory Given   Pulmonary sleep apnea and Continuous Positive Airway Pressure Ventilation , former smoker   breath sounds clear to auscultation       Cardiovascular hypertension, Pt. on medications and Pt. on home beta blockers + CAD   Rhythm:Regular Rate:Normal     Neuro/Psych  Neuromuscular disease    GI/Hepatic Neg liver ROS, hiatal hernia,GERD  ,,  Endo/Other  diabetes, Type 2    Renal/GU negative Renal ROS     Musculoskeletal   Abdominal   Peds  Hematology  (+) Blood dyscrasia, anemia , REFUSES BLOOD PRODUCTS  Anesthesia Other Findings   Reproductive/Obstetrics                             Lab Results  Component Value Date   WBC 8.5 11/13/2022   HGB 10.7 (L) 11/13/2022   HCT 34.9 (L) 11/13/2022   MCV 70.5 (L) 11/13/2022   PLT 273 11/13/2022   Lab Results  Component Value Date   CREATININE 1.04 (H) 11/13/2022   BUN 17 11/13/2022   NA 138 11/13/2022   K 4.0 11/13/2022   CL 101 11/13/2022   CO2 27 11/13/2022    Anesthesia Physical Anesthesia Plan  ASA: 3  Anesthesia Plan: General   Post-op Pain Management: Tylenol PO (pre-op)* and Minimal or no pain anticipated   Induction: Intravenous  PONV Risk Score and Plan: 3 and Dexamethasone, Ondansetron and Treatment may vary due to age or medical condition  Airway Management Planned: Double Lumen EBT  Additional Equipment: Arterial line  Intra-op Plan:   Post-operative Plan: Extubation in OR  Informed Consent: I have reviewed the patients History and Physical, chart, labs and discussed the procedure including the risks, benefits and alternatives for the  proposed anesthesia with the patient or authorized representative who has indicated his/her understanding and acceptance.     Dental advisory given  Plan Discussed with:   Anesthesia Plan Comments:        Anesthesia Quick Evaluation

## 2022-09-25 NOTE — Progress Notes (Signed)
See my note from 08/14/22  for full details.    Kayla Hendrix is a 71 y.o. female with a h/o Afib since 1999. She has had 8-9 cardioversions and 2 catheter ablations (2022, 2023). She was admitted in September for tikosyn initiation.   She has been working 6 days a week. Stress on and off. She has been considering going to [facility] to discuss convergent. Heard that we were doing convergent here, meets with [place] today.   In terms of symptoms, she has fatigue. Reports compliance with meds. She reports her rate being higher recently, more 110s and more tired in past several months.   After her first catheter ablation in 2022 she was free from AF for 5-6 months, after another ablation in 2023 she was back in AF within 2-3 weeks.    She recently started on Ozempic. She reports multiple side effects from anti-arrythmic medications in the past.    She has not had a stroke or TIA. Her mother had several strokes, including a severe one in her 56s, due to carotid issues per patient.      Assessment/ Plan  Expand All Collapse All     [facility].[address]       [place] [address]             [phone]                                        [name] Medical Record #[id number] Date of Birth: [birth year]   Primary Care Physician: [name], MD EP MDs:  Dr. Rayann Heman Dr Curt Bears Dr. Quentin Ore     Chief Complaint:   Fatigue     History of Present Illness:       Kayla Hendrix is a 71 y.o. female with a h/o Afib since 1999. She has had 8-9 cardioversions and 2 catheter ablations (2022, 2023). She was admitted in September for tikosyn initiation.   She has been working 6 days a week. Stress on and off. She has been considering going to [facility] to discuss convergent. Heard that we were doing convergent here, meets with [place] today.   In terms of symptoms, she has fatigue. Reports compliance with meds. She reports her rate being higher recently, more 110s and more tired in past  several months.   After her first catheter ablation in 2022 she was free from AF for 5-6 months, after another ablation in 2023 she was back in AF within 2-3 weeks.    She recently started on Ozempic. She reports multiple side effects from anti-arrythmic medications in the past.    She has not had a stroke or TIA. Her mother had several strokes, including a severe one in her 65s, due to carotid issues per patient.        Past Medical and Surgical History: Previous Chest Surgery: No Previous Chest Radiation: No Diabetes Mellitus: No   Creatinine: 1.0       Past Medical History:  Diagnosis Date   Allergy     Anemia     Anxiety     Atrial fibrillation (HCC)      paroxysmal only asa 81 mg, no other blood thinner, the xarelto she was on gave her HAs   Barrett's esophagus 2018   CAD (coronary artery disease)      cath 2006- nonobstructive CAD  Cataract     Depression     Diabetes mellitus without complication (HCC)     Gastric polyp     GERD (gastroesophageal reflux disease)     Glucose intolerance (impaired glucose tolerance)     Hiatal hernia     History of transesophageal echocardiography (TEE) for monitoring      TEE 3/17:  EF 55%, no RWMA, mild plaque in descending aorta, mild MR, mod BAE, normal RVF   HL (hearing loss)     HTN (hypertension)     Hyperlipidemia     Hyperplastic colon polyp     Internal hemorrhoids     Intestinal metaplasia of gastric mucosa     Obesity     OSA (obstructive sleep apnea)      noncompliant with CPAP   Sleep apnea     Tubular adenoma of colon             Past Surgical History:  Procedure Laterality Date   ATRIAL FIBRILLATION ABLATION N/A 11/15/2020    Procedure: ATRIAL FIBRILLATION ABLATION;  Surgeon: Thompson Grayer, MD;  Location: [place] [facility];  Service: Cardiovascular;  Laterality: N/A;   ATRIAL FIBRILLATION ABLATION N/A 01/09/2022    Procedure: ATRIAL FIBRILLATION ABLATION;  Surgeon: Constance Haw, MD;  Location: [place] [facility];  Service: Cardiovascular;  Laterality: N/A;   BIOPSY   10/17/2019    Procedure: BIOPSY;  Surgeon: Jerene Bears, MD;  Location: [facility];  Service: Gastroenterology;;   CARDIOVERSION N/A 01/23/2016    Procedure: CARDIOVERSION;  Surgeon: Larey Dresser, MD;  Location: [place];  Service: Cardiovascular;  Laterality: N/A;   CARDIOVERSION N/A 12/07/2019    Procedure: CARDIOVERSION;  Surgeon: Pixie Casino, MD;  Location: [place];  Service: Cardiovascular;  Laterality: N/A;   CARDIOVERSION N/A 08/15/2020    Procedure: CARDIOVERSION;  Surgeon: Werner Lean, MD;  Location: [place];  Service: Cardiovascular;  Laterality: N/A;   CARDIOVERSION N/A 12/27/2020    Procedure: CARDIOVERSION;  Surgeon: Jerline Pain, MD;  Location: [facility] ENDOSCOPY;  Service: Cardiovascular;  Laterality: N/A;   CARDIOVERSION N/A 08/21/2021    Procedure: CARDIOVERSION;  Surgeon: Pixie Casino, MD;  Location: [place];  Service: Cardiovascular;  Laterality: N/A;   CARDIOVERSION N/A 03/03/2022    Procedure: CARDIOVERSION;  Surgeon: [name], MD;  Location: [facility] ENDOSCOPY;  Service: Cardiovascular;  Laterality: N/A;   CESAREAN SECTION       ESOPHAGOGASTRODUODENOSCOPY (EGD) WITH PROPOFOL N/A 10/17/2019    Procedure: ESOPHAGOGASTRODUODENOSCOPY (EGD) WITH PROPOFOL;  Surgeon: Jerene Bears, MD;  Location: [facility];  Service: Gastroenterology;  Laterality: N/A;   GANGLION CYST EXCISION Left 1970s   HEMOSTASIS CLIP PLACEMENT   10/17/2019    Procedure: HEMOSTASIS CLIP PLACEMENT;  Surgeon: Jerene Bears, MD;  Location: [facility];  Service: Gastroenterology;;   POLYPECTOMY   10/17/2019    Procedure: POLYPECTOMY;  Surgeon: Jerene Bears, MD;  Location: [facility];  Service: Gastroenterology;;   TEE WITHOUT CARDIOVERSION N/A 01/23/2016    Procedure: TRANSESOPHAGEAL ECHOCARDIOGRAM (TEE);  Surgeon: Larey Dresser, MD;  Location: [place];  Service: Cardiovascular;   Laterality: N/A;   TEE WITHOUT CARDIOVERSION N/A 01/09/2022    Procedure: TRANSESOPHAGEAL ECHOCARDIOGRAM (TEE);  Surgeon: Constance Haw, MD;  Location: [place] [facility];  Service: Cardiovascular;  Laterality: N/A;   TOTAL ABDOMINAL HYSTERECTOMY       WRIST FRACTURE SURGERY   2010    wrist fracture repair with metal rod  Social History:   Social History        Tobacco Use  Smoking Status Former   Types: Cigarettes   Quit date: 2010   Years since quitting: 13.7  Smokeless Tobacco Never  Tobacco Comments    15-pack-year smoker    Social History        Substance and Sexual Activity  Alcohol Use No    Comment: rarely            Allergies  Allergen Reactions   Amiodarone Diarrhea and Nausea Only   Hydrocodone Itching      Medications:           Current Outpatient Medications  Medication Sig Dispense Refill   acetaminophen (TYLENOL) 500 MG tablet Take 1,000-1,500 mg by mouth daily as needed for mild pain or headache.       atorvastatin (LIPITOR) 40 MG tablet Take 40 mg by mouth 2 (two) times a week.       clonazePAM (KLONOPIN) 0.5 MG tablet Take 0.5 mg by mouth 2 (two) times daily as needed for anxiety.       diltiazem (TIAZAC) 360 MG 24 hr capsule Take 360 mg by mouth daily.       ELIQUIS 5 MG TABS tablet TAKE 1 TABLET BY MOUTH TWICE A DAY 180 tablet 1   Ferrous Sulfate (IRON) 325 (65 Fe) MG TABS Take 325 mg by mouth 2 (two) times a week.       furosemide (LASIX) 20 MG tablet TAKE 1 & 1/2 TABLET BY MOUTH DAILY FOR FLUID 135 tablet 2   gabapentin (NEURONTIN) 300 MG capsule Take 300 mg by mouth 2 (two) times daily.       glucose blood (ACCU-CHEK AVIVA PLUS) test strip CHECK BLOOD SUGAR TWICE DAILY AS DIRECTED       metFORMIN (GLUCOPHAGE-XR) 500 MG 24 hr tablet Take 1,000 mg by mouth at bedtime.       metoprolol tartrate (LOPRESSOR) 50 MG tablet TAKE 1 TABLET BY MOUTH TWICE A DAY 180 tablet 3   OZEMPIC, 2 MG/DOSE, 8 MG/3ML SOPN Inject 2 mg into the skin once a  week.        No current facility-administered medications for this visit.      (Not in a hospital admission)          Family History  Problem Relation Age of Onset   Cancer Father     Stomach cancer Father     Diabetes Other          noninsulin dependant DM   Hypertension Other     Stomach cancer Maternal Grandmother     Colon cancer Neg Hx     Colon polyps Neg Hx     Esophageal cancer Neg Hx     Rectal cancer Neg Hx          Review of Systems:    ROS 14 point ROS negative except as per above            Physical Exam: There were no vitals taken for this visit. Physical Exam  Gen NAD Resp nonlaboured CV irreg irreg rate 100s Neuro grossly nonfocal Extr [facility]       Diagnostic Studies & Laboratory data:    Left Heart Catherization:   Cath 2006- CONCLUSION:  1.  Well-preserved overall left ventricular function.  2.  Calcification of mid left anterior descending artery without critical    stenosis.     Echo: TTE 09/24/20   1.  Left ventricular ejection fraction, by estimation, is 60 to 65%. The  left ventricle has normal function. The left ventricle has no regional  wall motion abnormalities. There is mild concentric left ventricular  hypertrophy. Left ventricular diastolic  function could not be evaluated.   2. Right ventricular systolic function is normal. The right ventricular  size is normal.   3. Left atrial size was mildly dilated.   4. The mitral valve is normal in structure. Mild mitral valve  regurgitation. No evidence of mitral stenosis.   5. The aortic valve is normal in structure. Aortic valve regurgitation is  not visualized. No aortic stenosis is present.   6. The inferior vena cava is normal in size with greater than 50%  respiratory variability, suggesting right atrial pressure of 3 mmHg.    I have independently reviewed the above radiologic studies and discussed with the patient    Recent Lab Findings: Recent Labs       Lab Results   Component Value Date    WBC 11.0 (H) 02/24/2022    HGB 11.0 (L) 02/24/2022    HCT 35.7 (L) 02/24/2022    PLT 327 02/24/2022    GLUCOSE 97 07/11/2022    CHOL 159 04/10/2012    TRIG 230 (H) 04/10/2012    HDL 34 (L) 04/10/2012    LDLDIRECT 117.4 08/06/2009    LDLCALC 79 04/10/2012    ALT 28 04/08/2012    AST 33 04/08/2012    NA 138 07/11/2022    K 4.3 07/11/2022    CL 102 07/11/2022    CREATININE 1.04 (H) 07/11/2022    BUN 14 07/11/2022    CO2 26 07/11/2022    TSH 1.888 03/14/2020    INR 1.0 04/08/2012    HGBA1C 5.7 04/10/2012       No sig change to Family, social history, meds, allergies.   Exam NAD Nonlaboured resp Abd softntnd Extr wwp     Assessment / Plan:   71 y.o. female with persistent atrial fibrillation with a history of multiple cardioversions and 2 catheter ablations, recently admitted for Tikosyn. Co-morbidities include HTN, HLD, OSA, obesity, GERD. Cardiac hx of nonobstructive CAD and had normal EF and mild MR on echo in the past.    Epicardial ablation / LAA clip  on Monday January 8th CTA coronary preop Carotid duplex preop - see below Last dose of Eliquis Saturday Jan 6th in AM Typically I use 1 week colchicine preop as well   I told her we are getting started with Convergent here and she would be on of the initial patients. I offered her procedure to be later if our experience here if that's what she prefers. It seems as of now she wants to get this done. I offered to call her son as well as he hasn't been at either of the office visits.     Typical workup includes: CTA coronary for patients > 70 yo.  Echo in the last year - already done for this patient For this patient in particular, additionally obtain carotid duplex (Mother at 81 yo with stoke and carotid surgery)     We broadly discussed the rationale for the convergent approach.  Standard recovery is walking around day after procedure, discharge to home postop day 2.  I see convergent  (epicardial ablation) patients postoperatively back in the clinic in 1 week and 1 month. Patients generally have a full recovery by 3 weeks.    Risks/benefits/alternatives discussed at length. All questions were  asked an answered. Risks are <<1% mortality, 2% morbidity (bleeding, infection, damage to surrounding structures) and >97% standard recovery.   Typically I do these procedures on Mondays, last dose of Eliquis is Saturday morning. I restart AC the night of surgery. If they receive an LAA clip, most patients, but not all, come off their AC 2-6 months after surgery at the discretion of their referring cardiologist. Many ultimately have reductions in the degree of antiarrythmic medications as well. Expected outcome is significantly more freedom from Afib (measured by 90% reduction of time in AF).    Justice Rocher MD  CV Surgery

## 2022-10-04 ENCOUNTER — Other Ambulatory Visit (HOSPITAL_COMMUNITY): Payer: Self-pay | Admitting: Nurse Practitioner

## 2022-10-06 ENCOUNTER — Other Ambulatory Visit (HOSPITAL_COMMUNITY): Payer: Self-pay

## 2022-10-06 MED ORDER — OZEMPIC (2 MG/DOSE) 8 MG/3ML ~~LOC~~ SOPN
2.0000 mg | PEN_INJECTOR | SUBCUTANEOUS | 1 refills | Status: AC
  Filled 2022-10-06: qty 3, 28d supply, fill #0

## 2022-10-10 ENCOUNTER — Telehealth (HOSPITAL_COMMUNITY): Payer: Self-pay | Admitting: *Deleted

## 2022-10-10 NOTE — Telephone Encounter (Signed)
Attempted to call patient regarding upcoming cardiac CT appointment. °Left message on voicemail with name and callback number ° °Madia Carvell RN Navigator Cardiac Imaging °[facility] °[phone] Office °[phone] Cell ° °

## 2022-10-13 ENCOUNTER — Ambulatory Visit (HOSPITAL_COMMUNITY): Admission: RE | Admit: 2022-10-13 | Payer: BC Managed Care – PPO | Source: Ambulatory Visit

## 2022-10-13 ENCOUNTER — Telehealth (HOSPITAL_COMMUNITY): Payer: Self-pay | Admitting: Emergency Medicine

## 2022-10-13 NOTE — Telephone Encounter (Signed)
Phone call to patient to explain that after conversation with D. Enter, MD, he revealed that his goal for the CCTA was to evaluate coronaries for CAD related to her upcoming convergent procedure. Unfortunately Mrs. Mikus is in persistent Afib and this is a contraindication for a coronary CTA. I explained to the patient that her appt today is cancelled because we do not want to expose her to radiation/contrast media if its going to produce non-diagnostic images. Pt verbalized understanding and will continue with appt on 11/13/22 for ultrasound. Will keep phone nearby in the event [facility] office calls with alternative imaging strategies.   Marchia Bond RN Navigator Cardiac Imaging [facility] [phone] Office  [phone] Cell

## 2022-10-17 ENCOUNTER — Telehealth: Payer: Self-pay | Admitting: Cardiology

## 2022-10-17 NOTE — Telephone Encounter (Signed)
Patient is calling stating she is returning a call she received this afternoon. I was unable to find documentation of who called. Please advise.

## 2022-10-20 NOTE — Telephone Encounter (Signed)
Patient returned RN's call. 

## 2022-10-20 NOTE — Telephone Encounter (Signed)
Left message to call back  

## 2022-10-23 DIAGNOSIS — Z85828 Personal history of other malignant neoplasm of skin: Secondary | ICD-10-CM | POA: Diagnosis not present

## 2022-10-23 DIAGNOSIS — Z08 Encounter for follow-up examination after completed treatment for malignant neoplasm: Secondary | ICD-10-CM | POA: Diagnosis not present

## 2022-10-29 ENCOUNTER — Telehealth: Payer: Self-pay | Admitting: Cardiology

## 2022-10-29 NOTE — Telephone Encounter (Signed)
Patient called stating she is returning a call she received today.  Did not see any documentation about a phone call. She thinks the call might be about a procedure she has coming up.

## 2022-10-29 NOTE — Telephone Encounter (Signed)
Left message to call office

## 2022-10-30 ENCOUNTER — Telehealth: Payer: Self-pay | Admitting: Cardiovascular Disease

## 2022-10-30 ENCOUNTER — Ambulatory Visit: Payer: BC Managed Care – PPO | Admitting: Physician Assistant

## 2022-10-30 DIAGNOSIS — Z01810 Encounter for preprocedural cardiovascular examination: Secondary | ICD-10-CM

## 2022-10-30 NOTE — Telephone Encounter (Signed)
-----   Message from Sherren Mocha, MD sent at 10/16/2022 12:02 PM EST ----- Sure thing will do thx Thurmond Butts ----- Message ----- From: Laury Deep, RN Sent: 10/16/2022  10:50 AM EST To: Sherren Mocha, MD; Donnalee Curry K  Truman Aceituno/Coop:   Dr. Tenny Craw would like a left heart cath on this patient please??  We plan to do her Convergent procedure 11/17/22 and here is what he sent me: "Ryan - usually I get a CT scan for Convergent workup to ensure no sig CAD. I don't get cath's preop for this. However - This lady already had a 98% Calcium score on CT. Let's go ahead and get a cardiac cath done. I can talk to her by phone tomorrow afternoon as well."  Can you all set her up for a heart cath sometime prior to 1/8?  Thanks,  [employer]

## 2022-10-30 NOTE — Telephone Encounter (Signed)
Per Thurmond Butts

## 2022-10-30 NOTE — Telephone Encounter (Signed)
Many attempts made to reach patient and son. LMTCB 12/8, 12/11, 12/20, 12/21 to both persons. Pt returns call to office and this RN explained that she needs to have a L heart cath done prior to Convergent procedure scheduled 11/17/22 per Dr Tenny Craw since she could not do the CCTA (due to A-fib). Pt scheduled for cath 11/06/22 '@10'$ :30 with Cooper. Pt scheduled w.APP 11/05/22 for updated H&P, labs, and EKG. Cath instructions reviewed with patient via telephone and sent via [place]. Labs ordered and scheduled for 11/05/22.

## 2022-10-30 NOTE — Telephone Encounter (Signed)
Pt called into office, still not sure who called or why. She thinks it may be related to her upcoming procedure with Dr. Tenny Craw next month. Advised that I cannot find that our office called. Aware I am placing recall for next summer to follow  up with Dr. Curt Bears. Advised to call [facility] to inquire if they are trying to reach her. Patient verbalized understanding and agreeable to plan.

## 2022-10-30 NOTE — Telephone Encounter (Signed)
See 12/20 telephone note.

## 2022-11-03 NOTE — H&P (View-Only) (Signed)
Office Visit    Patient Name: Kayla Hendrix Date of Encounter: 11/05/2022  Primary Care Provider:  [name], MD Primary Cardiologist:  None Primary Electrophysiologist: Will Meredith Leeds, MD  Chief Complaint    Kayla Hendrix is a 71 y.o. female with PMH of nonobstructive CAD, DM, GERD, HLD, HTN, OSA (not on CPAP), paroxysmal atrial fibrillation, incomplete RBBB who presents today for preop clearance and LHC visit.  Past Medical History    Past Medical History:  Diagnosis Date   Allergy    Anemia    Anxiety    Atrial fibrillation (HCC)    paroxysmal only asa 81 mg, no other blood thinner, the xarelto she was on gave her HAs   Barrett's esophagus 2018   CAD (coronary artery disease)    cath 2006- nonobstructive CAD   Cataract    Depression    Diabetes mellitus without complication (HCC)    Gastric polyp    GERD (gastroesophageal reflux disease)    Glucose intolerance (impaired glucose tolerance)    Hiatal hernia    History of transesophageal echocardiography (TEE) for monitoring    TEE 3/17:  EF 55%, no RWMA, mild plaque in descending aorta, mild MR, mod BAE, normal RVF   HL (hearing loss)    HTN (hypertension)    Hyperlipidemia    Hyperplastic colon polyp    Internal hemorrhoids    Intestinal metaplasia of gastric mucosa    Obesity    OSA (obstructive sleep apnea)    noncompliant with CPAP   Sleep apnea    Tubular adenoma of colon    Past Surgical History:  Procedure Laterality Date   ATRIAL FIBRILLATION ABLATION N/A 11/15/2020   Procedure: ATRIAL FIBRILLATION ABLATION;  Surgeon: Thompson Grayer, MD;  Location: [place] [facility];  Service: Cardiovascular;  Laterality: N/A;   ATRIAL FIBRILLATION ABLATION N/A 01/09/2022   Procedure: ATRIAL FIBRILLATION ABLATION;  Surgeon: Constance Haw, MD;  Location: [place] [facility];  Service: Cardiovascular;  Laterality: N/A;   BIOPSY  10/17/2019   Procedure: BIOPSY;  Surgeon: Jerene Bears, MD;  Location: [facility];  Service: Gastroenterology;;   CARDIOVERSION N/A 01/23/2016   Procedure: CARDIOVERSION;  Surgeon: Larey Dresser, MD;  Location: [place];  Service: Cardiovascular;  Laterality: N/A;   CARDIOVERSION N/A 12/07/2019   Procedure: CARDIOVERSION;  Surgeon: Pixie Casino, MD;  Location: [place];  Service: Cardiovascular;  Laterality: N/A;   CARDIOVERSION N/A 08/15/2020   Procedure: CARDIOVERSION;  Surgeon: Werner Lean, MD;  Location: [place];  Service: Cardiovascular;  Laterality: N/A;   CARDIOVERSION N/A 12/27/2020   Procedure: CARDIOVERSION;  Surgeon: Jerline Pain, MD;  Location: [facility] ENDOSCOPY;  Service: Cardiovascular;  Laterality: N/A;   CARDIOVERSION N/A 08/21/2021   Procedure: CARDIOVERSION;  Surgeon: Pixie Casino, MD;  Location: [place];  Service: Cardiovascular;  Laterality: N/A;   CARDIOVERSION N/A 03/03/2022   Procedure: CARDIOVERSION;  Surgeon: [name], MD;  Location: [facility]OSCOPY;  Service: Cardiovascular;  Laterality: N/A;   CESAREAN SECTION     ESOPHAGOGASTRODUODENOSCOPY (EGD) WITH PROPOFOL N/A 10/17/2019   Procedure: ESOPHAGOGASTRODUODENOSCOPY (EGD) WITH PROPOFOL;  Surgeon: Jerene Bears, MD;  Location: [facility] ENDOSCOPY;  Service: Gastroenterology;  Laterality: N/A;   GANGLION CYST EXCISION Left 1970s   HEMOSTASIS CLIP PLACEMENT  10/17/2019   Procedure: HEMOSTASIS CLIP PLACEMENT;  Surgeon: Jerene Bears, MD;  Location: [facility] ENDOSCOPY;  Service: Gastroenterology;;   POLYPECTOMY  10/17/2019   Procedure: POLYPECTOMY;  Surgeon: Jerene Bears, MD;  Location: [facility];  Service: Gastroenterology;;   TEE WITHOUT CARDIOVERSION N/A 01/23/2016   Procedure: TRANSESOPHAGEAL ECHOCARDIOGRAM (TEE);  Surgeon: Larey Dresser, MD;  Location: [place];  Service: Cardiovascular;  Laterality: N/A;   TEE WITHOUT CARDIOVERSION N/A 01/09/2022   Procedure: TRANSESOPHAGEAL ECHOCARDIOGRAM (TEE);  Surgeon: Constance Haw, MD;  Location: [place]  [facility];  Service: Cardiovascular;  Laterality: N/A;   TOTAL ABDOMINAL HYSTERECTOMY     WRIST FRACTURE SURGERY  2010   wrist fracture repair with metal rod    Allergies  Allergies  Allergen Reactions   Amiodarone Diarrhea and Nausea Only   Hydrocodone Itching    History of Present Illness    Kayla Hendrix  is a 71year old female with the above mention past medical history who presents today for preleft heart cath visit and surgical clearance.  Kayla Hendrix has been treated for atrial fibrillation for over 20 years within our group.  She has been followed by Dr. Rayann Heman for management of AF since 2010.  She had previous left heart cath in 2006 that showed nonobstructive CAD.  She underwent DCCV in 2017 with self cardioversion and 2019.  She underwent successful cardioversion and 12/07/2019 with unsuccessful attempt at cardioversion.  She had a failed attempt at [place] therapy due to low magnesium and had atrial fibrillation ablation in 11/2020 and subsequently 2023.  Her last DCCV was 02/2022 that was successful.  She is intolerant to amiodarone due to GI upset and presents today for surgical clearance and left heart cath for convergent procedure.  Kayla Hendrix presents today for preoperative clearance and to discuss upcoming left heart catheterization procedure.  She reports no changes to her cardiac health since previously being seen in the office.  Her blood pressure today was well-controlled at 118/65 and heart rate was 96 bpm.  She is compliant with her medication regimen and denies any adverse reactions.  She is euvolemic on examination reports slight edema and lower extremities that is more prominent on the left ankle.  During our visit we discussed the risk and expectations of her upcoming LHC procedure.  She had all questions answered to her satisfaction and was in agreement to proceed at this time. Patient denies chest pain, palpitations, dyspnea, PND, orthopnea, nausea, vomiting, dizziness,  syncope, edema, weight gain, or early satiety.   Home Medications    Current Outpatient Medications  Medication Sig Dispense Refill   acetaminophen (TYLENOL) 500 MG tablet Take 1,000-1,500 mg by mouth daily as needed for mild pain or headache.     atorvastatin (LIPITOR) 40 MG tablet Take 40 mg by mouth 2 (two) times a week.     clonazePAM (KLONOPIN) 0.5 MG tablet Take 0.5 mg by mouth 2 (two) times daily as needed for anxiety.     diltiazem (TIAZAC) 360 MG 24 hr capsule Take 360 mg by mouth daily.     ELIQUIS 5 MG TABS tablet TAKE 1 TABLET BY MOUTH TWICE A DAY 180 tablet 1   furosemide (LASIX) 20 MG tablet TAKE 1 & 1/2 TABLET BY MOUTH DAILY FOR FLUID 135 tablet 2   gabapentin (NEURONTIN) 300 MG capsule Take 300 mg by mouth 2 (two) times daily.     glucose blood (ACCU-CHEK AVIVA PLUS) test strip CHECK BLOOD SUGAR TWICE DAILY AS DIRECTED     metoprolol tartrate (LOPRESSOR) 50 MG tablet TAKE 1 TABLET BY MOUTH TWICE A DAY (Patient taking differently: Take 25 mg by mouth 2 (two)  times daily. TAKE 0.5 TABLET BY MOUTH TWICE A DAY) 180 tablet 3   OZEMPIC, 2 MG/DOSE, 8 MG/3ML SOPN Inject 2 mg into the skin once a week.     Semaglutide, 2 MG/DOSE, (OZEMPIC, 2 MG/DOSE,) 8 MG/3ML SOPN Inject 2 mg into the skin once a week. 9 mL 1   Ferrous Sulfate (IRON) 325 (65 Fe) MG TABS Take 325 mg by mouth 2 (two) times a week. (Patient not taking: Reported on 11/05/2022)     No current facility-administered medications for this visit.     Review of Systems  Please see the history of present illness.    (+) Arthritic back pain (+) Trace lower extremity edema  All other systems reviewed and are otherwise negative except as noted above.  Physical Exam    Wt Readings from Last 3 Encounters:  11/05/22 218 lb 3.2 oz (99 kg)  09/25/22 208 lb (94.3 kg)  08/14/22 208 lb 11.2 oz (94.7 kg)   VS: Vitals:   11/05/22 0823  BP: 118/66  Pulse: 96  SpO2: 96%  ,Body mass index is 35.22 kg/m.  Constitutional:       Appearance: Healthy appearance. Not in distress.  Neck:     Vascular: JVD normal.  Pulmonary:     Effort: Pulmonary effort is normal.     Breath sounds: No wheezing. No rales. Diminished in the bases Cardiovascular:     Irregularly irregular. Normal S1. Normal S2.      Murmurs: There is no murmur.  Edema:    Trace lower extremity edema Abdominal:     Palpations: Abdomen is soft non tender. There is no hepatomegaly.  Skin:    General: Skin is warm and dry.  Neurological:     General: No focal deficit present.     Mental Status: Alert and oriented to person, place and time.     Cranial Nerves: Cranial nerves are intact.  EKG/LABS/Other Studies Reviewed    ECG personally reviewed by me today -atrial fibs with RVR and incomplete RBBB with rate of 115 bpm and no acute changes consistent with previous EKG.  Risk Assessment/Calculations:    CHA2DS2-VASc Score =     This indicates a  % annual risk of stroke. The patient's score is based upon:            Lab Results  Component Value Date   WBC 11.0 (H) 02/24/2022   HGB 11.0 (L) 02/24/2022   HCT 35.7 (L) 02/24/2022   MCV 75.3 (L) 02/24/2022   PLT 327 02/24/2022   Lab Results  Component Value Date   CREATININE 1.04 (H) 07/11/2022   BUN 14 07/11/2022   NA 138 07/11/2022   K 4.3 07/11/2022   CL 102 07/11/2022   CO2 26 07/11/2022   Lab Results  Component Value Date   ALT 28 04/08/2012   AST 33 04/08/2012   ALKPHOS 87 04/08/2012   BILITOT 0.3 04/08/2012   Lab Results  Component Value Date   CHOL 159 04/10/2012   HDL 34 (L) 04/10/2012   LDLCALC 79 04/10/2012   LDLDIRECT 117.4 08/06/2009   TRIG 230 (H) 04/10/2012   CHOLHDL 6 08/06/2009    Lab Results  Component Value Date   HGBA1C 5.7 04/10/2012    Assessment & Plan    1.  Surgical clearance: The patient affirms she has been doing well without any new cardiac symptoms. They are able to achieve 6 METS without cardiac limitations. Therefore, based on ACC/AHA  guidelines, the  patient would be at acceptable risk for the planned procedure without further cardiovascular testing. The patient was advised that if she develops new symptoms prior to surgery to contact our office to arrange for a follow-up visit, and she verbalized understanding.  Ms. Vader perioperative risk of a major cardiac event is 0.9% according to the Revised Cardiac Risk Index (RCRI).  Therefore, she is at high risk for perioperative complications.   Her functional capacity is fair at 6.05 METs according to the Duke Activity Status Index (DASI). Recommendations: According to ACC/AHA guidelines, no further cardiovascular testing needed.  The patient may proceed to surgery at acceptable risk.   Antiplatelet and/or Anticoagulation Recommendations: Per pharmacy  2.  Persistent atrial fibrillation: -Patient had failed DCCV, 2 failed ablations, and intolerance to Tikosyn and amiodarone. -She is scheduled for convergent procedure in January -Continue rate control with Cardizem 360 mg daily, Eliquis 5 mg twice daily, metoprolol 12.5 mg twice daily  3.  Essential hypertension: -Patient's blood pressure today was well-controlled at 118/66 -Continue metoprolol 25 mg twice daily  4.  Hyperlipidemia: -Patient's last LDL cholesterol was 125 on 08/2022 -Continue Lipitor 40 mg daily  5.  Obstructive sleep apnea: -Not using currently using due to intolerance   Disposition: Follow-up with Dr. Curt Bears as scheduled Shared Decision Making/Informed Consent The risks [stroke (1 in 1000), death (1 in 30), kidney failure [usually temporary] (1 in 500), bleeding (1 in 200), allergic reaction [possibly serious] (1 in 200)], benefits (diagnostic support and management of coronary artery disease) and alternatives of a cardiac catheterization were discussed in detail with Ms. Cavallero and she is willing to proceed.   Medication Adjustments/Labs and Tests Ordered: Current medicines are reviewed at length  with the patient today.  Concerns regarding medicines are outlined above.   Signed, Mable Fill, Marissa Nestle, NP 11/05/2022, 9:38 AM [facility]  Note:  This document was prepared using Dragon voice recognition software and may include unintentional dictation errors.

## 2022-11-03 NOTE — Progress Notes (Unsigned)
Office Visit    Patient Name: Kayla Hendrix Date of Encounter: 11/05/2022  Primary Care Provider:  [name], MD Primary Cardiologist:  None Primary Electrophysiologist: Will Meredith Leeds, MD  Chief Complaint    Kayla Hendrix is a 71 y.o. female with PMH of nonobstructive CAD, DM, GERD, HLD, HTN, OSA (not on CPAP), paroxysmal atrial fibrillation, incomplete RBBB who presents today for preop clearance and LHC visit.  Past Medical History    Past Medical History:  Diagnosis Date   Allergy    Anemia    Anxiety    Atrial fibrillation (HCC)    paroxysmal only asa 81 mg, no other blood thinner, the xarelto she was on gave her HAs   Barrett's esophagus 2018   CAD (coronary artery disease)    cath 2006- nonobstructive CAD   Cataract    Depression    Diabetes mellitus without complication (HCC)    Gastric polyp    GERD (gastroesophageal reflux disease)    Glucose intolerance (impaired glucose tolerance)    Hiatal hernia    History of transesophageal echocardiography (TEE) for monitoring    TEE 3/17:  EF 55%, no RWMA, mild plaque in descending aorta, mild MR, mod BAE, normal RVF   HL (hearing loss)    HTN (hypertension)    Hyperlipidemia    Hyperplastic colon polyp    Internal hemorrhoids    Intestinal metaplasia of gastric mucosa    Obesity    OSA (obstructive sleep apnea)    noncompliant with CPAP   Sleep apnea    Tubular adenoma of colon    Past Surgical History:  Procedure Laterality Date   ATRIAL FIBRILLATION ABLATION N/A 11/15/2020   Procedure: ATRIAL FIBRILLATION ABLATION;  Surgeon: Thompson Grayer, MD;  Location: [place] [facility];  Service: Cardiovascular;  Laterality: N/A;   ATRIAL FIBRILLATION ABLATION N/A 01/09/2022   Procedure: ATRIAL FIBRILLATION ABLATION;  Surgeon: Constance Haw, MD;  Location: [place] [facility];  Service: Cardiovascular;  Laterality: N/A;   BIOPSY  10/17/2019   Procedure: BIOPSY;  Surgeon: Jerene Bears, MD;  Location: [facility];  Service: Gastroenterology;;   CARDIOVERSION N/A 01/23/2016   Procedure: CARDIOVERSION;  Surgeon: Larey [facility]er, MD;  Location: [place];  Service: Cardiovascular;  Laterality: N/A;   CARDIOVERSION N/A 12/07/2019   Procedure: CARDIOVERSION;  Surgeon: Pixie Casino, MD;  Location: [place];  Service: Cardiovascular;  Laterality: N/A;   CARDIOVERSION N/A 08/15/2020   Procedure: CARDIOVERSION;  Surgeon: Werner Lean, MD;  Location: [place];  Service: Cardiovascular;  Laterality: N/A;   CARDIOVERSION N/A 12/27/2020   Procedure: CARDIOVERSION;  Surgeon: Jerline Pain, MD;  Location: [facility] ENDOSCOPY;  Service: Cardiovascular;  Laterality: N/A;   CARDIOVERSION N/A 08/21/2021   Procedure: CARDIOVERSION;  Surgeon: Pixie Casino, MD;  Location: [place];  Service: Cardiovascular;  Laterality: N/A;   CARDIOVERSION N/A 03/03/2022   Procedure: CARDIOVERSION;  Surgeon: [name], MD;  Location: [facility] ENDOSCOPY;  Service: Cardiovascular;  Laterality: N/A;   CESAREAN SECTION     ESOPHAGOGASTRODUODENOSCOPY (EGD) WITH PROPOFOL N/A 10/17/2019   Procedure: ESOPHAGOGASTRODUODENOSCOPY (EGD) WITH PROPOFOL;  Surgeon: Jerene Bears, MD;  Location: [facility];  Service: Gastroenterology;  Laterality: N/A;   GANGLION CYST EXCISION Left 1970s   HEMOSTASIS CLIP PLACEMENT  10/17/2019   Procedure: HEMOSTASIS CLIP PLACEMENT;  Surgeon: Jerene Bears, MD;  Location: [facility];  Service: Gastroenterology;;   POLYPECTOMY  10/17/2019   Procedure: POLYPECTOMY;  Surgeon: Jerene Bears, MD;  Location: [facility];  Service: Gastroenterology;;   TEE WITHOUT CARDIOVERSION N/A 01/23/2016   Procedure: TRANSESOPHAGEAL ECHOCARDIOGRAM (TEE);  Surgeon: Larey Dresser, MD;  Location: [place];  Service: Cardiovascular;  Laterality: N/A;   TEE WITHOUT CARDIOVERSION N/A 01/09/2022   Procedure: TRANSESOPHAGEAL ECHOCARDIOGRAM (TEE);  Surgeon: Constance Haw, MD;  Location: [place]  [facility];  Service: Cardiovascular;  Laterality: N/A;   TOTAL ABDOMINAL HYSTERECTOMY     WRIST FRACTURE SURGERY  2010   wrist fracture repair with metal rod    Allergies  Allergies  Allergen Reactions   Amiodarone Diarrhea and Nausea Only   Hydrocodone Itching    History of Present Illness    STARKISHA TULLIS  is a 71year old female with the above mention past medical history who presents today for preleft heart cath visit and surgical clearance.  Kayla Hendrix has been treated for atrial fibrillation for over 20 years within our group.  She has been followed by Dr. Rayann Heman for management of AF since 2010.  She had previous left heart cath in 2006 that showed nonobstructive CAD.  She underwent DCCV in 2017 with self cardioversion and 2019.  She underwent successful cardioversion and 12/07/2019 with unsuccessful attempt at cardioversion.  She had a failed attempt at [place] therapy due to low magnesium and had atrial fibrillation ablation in 11/2020 and subsequently 2023.  Her last DCCV was 02/2022 that was successful.  She is intolerant to amiodarone due to GI upset and presents today for surgical clearance and left heart cath for convergent procedure.  Kayla Hendrix presents today for preoperative clearance and to discuss upcoming left heart catheterization procedure.  She reports no changes to her cardiac health since previously being seen in the office.  Her blood pressure today was well-controlled at 118/65 and heart rate was 96 bpm.  She is compliant with her medication regimen and denies any adverse reactions.  She is euvolemic on examination reports slight edema and lower extremities that is more prominent on the left ankle.  During our visit we discussed the risk and expectations of her upcoming LHC procedure.  She had all questions answered to her satisfaction and was in agreement to proceed at this time. Patient denies chest pain, palpitations, dyspnea, PND, orthopnea, nausea, vomiting, dizziness,  syncope, edema, weight gain, or early satiety.   Home Medications    Current Outpatient Medications  Medication Sig Dispense Refill   acetaminophen (TYLENOL) 500 MG tablet Take 1,000-1,500 mg by mouth daily as needed for mild pain or headache.     atorvastatin (LIPITOR) 40 MG tablet Take 40 mg by mouth 2 (two) times a week.     clonazePAM (KLONOPIN) 0.5 MG tablet Take 0.5 mg by mouth 2 (two) times daily as needed for anxiety.     diltiazem (TIAZAC) 360 MG 24 hr capsule Take 360 mg by mouth daily.     ELIQUIS 5 MG TABS tablet TAKE 1 TABLET BY MOUTH TWICE A DAY 180 tablet 1   furosemide (LASIX) 20 MG tablet TAKE 1 & 1/2 TABLET BY MOUTH DAILY FOR FLUID 135 tablet 2   gabapentin (NEURONTIN) 300 MG capsule Take 300 mg by mouth 2 (two) times daily.     glucose blood (ACCU-CHEK AVIVA PLUS) test strip CHECK BLOOD SUGAR TWICE DAILY AS DIRECTED     metoprolol tartrate (LOPRESSOR) 50 MG tablet TAKE 1 TABLET BY MOUTH TWICE A DAY (Patient taking differently: Take 25 mg by mouth 2 (two)  times daily. TAKE 0.5 TABLET BY MOUTH TWICE A DAY) 180 tablet 3   OZEMPIC, 2 MG/DOSE, 8 MG/3ML SOPN Inject 2 mg into the skin once a week.     Semaglutide, 2 MG/DOSE, (OZEMPIC, 2 MG/DOSE,) 8 MG/3ML SOPN Inject 2 mg into the skin once a week. 9 mL 1   Ferrous Sulfate (IRON) 325 (65 Fe) MG TABS Take 325 mg by mouth 2 (two) times a week. (Patient not taking: Reported on 11/05/2022)     No current facility-administered medications for this visit.     Review of Systems  Please see the history of present illness.    (+) Arthritic back pain (+) Trace lower extremity edema  All other systems reviewed and are otherwise negative except as noted above.  Physical Exam    Wt Readings from Last 3 Encounters:  11/05/22 218 lb 3.2 oz (99 kg)  09/25/22 208 lb (94.3 kg)  08/14/22 208 lb 11.2 oz (94.7 kg)   VS: Vitals:   11/05/22 0823  BP: 118/66  Pulse: 96  SpO2: 96%  ,Body mass index is 35.22 kg/m.  Constitutional:       Appearance: Healthy appearance. Not in distress.  Neck:     Vascular: JVD normal.  Pulmonary:     Effort: Pulmonary effort is normal.     Breath sounds: No wheezing. No rales. Diminished in the bases Cardiovascular:     Irregularly irregular. Normal S1. Normal S2.      Murmurs: There is no murmur.  Edema:    Trace lower extremity edema Abdominal:     Palpations: Abdomen is soft non tender. There is no hepatomegaly.  Skin:    General: Skin is warm and dry.  Neurological:     General: No focal deficit present.     Mental Status: Alert and oriented to person, place and time.     Cranial Nerves: Cranial nerves are intact.  EKG/LABS/Other Studies Reviewed    ECG personally reviewed by me today -atrial fibs with RVR and incomplete RBBB with rate of 115 bpm and no acute changes consistent with previous EKG.  Risk Assessment/Calculations:    CHA2DS2-VASc Score =     This indicates a  % annual risk of stroke. The patient's score is based upon:            Lab Results  Component Value Date   WBC 11.0 (H) 02/24/2022   HGB 11.0 (L) 02/24/2022   HCT 35.7 (L) 02/24/2022   MCV 75.3 (L) 02/24/2022   PLT 327 02/24/2022   Lab Results  Component Value Date   CREATININE 1.04 (H) 07/11/2022   BUN 14 07/11/2022   NA 138 07/11/2022   K 4.3 07/11/2022   CL 102 07/11/2022   CO2 26 07/11/2022   Lab Results  Component Value Date   ALT 28 04/08/2012   AST 33 04/08/2012   ALKPHOS 87 04/08/2012   BILITOT 0.3 04/08/2012   Lab Results  Component Value Date   CHOL 159 04/10/2012   HDL 34 (L) 04/10/2012   LDLCALC 79 04/10/2012   LDLDIRECT 117.4 08/06/2009   TRIG 230 (H) 04/10/2012   CHOLHDL 6 08/06/2009    Lab Results  Component Value Date   HGBA1C 5.7 04/10/2012    Assessment & Plan    1.  Surgical clearance: The patient affirms she has been doing well without any new cardiac symptoms. They are able to achieve 6 METS without cardiac limitations. Therefore, based on ACC/AHA  guidelines, the  patient would be at acceptable risk for the planned procedure without further cardiovascular testing. The patient was advised that if she develops new symptoms prior to surgery to contact our office to arrange for a follow-up visit, and she verbalized understanding.  Kayla Hendrix perioperative risk of a major cardiac event is 0.9% according to the Revised Cardiac Risk Index (RCRI).  Therefore, she is at high risk for perioperative complications.   Her functional capacity is fair at 6.05 METs according to the Duke Activity Status Index (DASI). Recommendations: According to ACC/AHA guidelines, no further cardiovascular testing needed.  The patient may proceed to surgery at acceptable risk.   Antiplatelet and/or Anticoagulation Recommendations: Per pharmacy  2.  Persistent atrial fibrillation: -Patient had failed DCCV, 2 failed ablations, and intolerance to Tikosyn and amiodarone. -She is scheduled for convergent procedure in January -Continue rate control with Cardizem 360 mg daily, Eliquis 5 mg twice daily, metoprolol 12.5 mg twice daily  3.  Essential hypertension: -Patient's blood pressure today was well-controlled at 118/66 -Continue metoprolol 25 mg twice daily  4.  Hyperlipidemia: -Patient's last LDL cholesterol was 125 on 08/2022 -Continue Lipitor 40 mg daily  5.  Obstructive sleep apnea: -Not using currently using due to intolerance   Disposition: Follow-up with Dr. Curt Bears as scheduled Shared Decision Making/Informed Consent The risks [stroke (1 in 1000), death (1 in 39), kidney failure [usually temporary] (1 in 500), bleeding (1 in 200), allergic reaction [possibly serious] (1 in 200)], benefits (diagnostic support and management of coronary artery disease) and alternatives of a cardiac catheterization were discussed in detail with Kayla Hendrix and she is willing to proceed.   Medication Adjustments/Labs and Tests Ordered: Current medicines are reviewed at length  with the patient today.  Concerns regarding medicines are outlined above.   Signed, Mable Fill, Marissa Nestle, NP 11/05/2022, 9:38 AM [facility]  Note:  This document was prepared using Dragon voice recognition software and may include unintentional dictation errors.

## 2022-11-04 ENCOUNTER — Other Ambulatory Visit: Payer: Self-pay | Admitting: *Deleted

## 2022-11-04 DIAGNOSIS — I4891 Unspecified atrial fibrillation: Secondary | ICD-10-CM

## 2022-11-04 DIAGNOSIS — Z01810 Encounter for preprocedural cardiovascular examination: Secondary | ICD-10-CM

## 2022-11-05 ENCOUNTER — Telehealth: Payer: Self-pay | Admitting: *Deleted

## 2022-11-05 ENCOUNTER — Encounter: Payer: Self-pay | Admitting: Nurse Practitioner

## 2022-11-05 ENCOUNTER — Ambulatory Visit: Payer: BC Managed Care – PPO | Attending: Physician Assistant | Admitting: Nurse Practitioner

## 2022-11-05 ENCOUNTER — Ambulatory Visit: Payer: BC Managed Care – PPO

## 2022-11-05 VITALS — BP 118/66 | HR 96 | Ht 66.0 in | Wt 218.2 lb

## 2022-11-05 DIAGNOSIS — I10 Essential (primary) hypertension: Secondary | ICD-10-CM

## 2022-11-05 DIAGNOSIS — Z01810 Encounter for preprocedural cardiovascular examination: Secondary | ICD-10-CM

## 2022-11-05 DIAGNOSIS — G4733 Obstructive sleep apnea (adult) (pediatric): Secondary | ICD-10-CM | POA: Diagnosis not present

## 2022-11-05 DIAGNOSIS — I4891 Unspecified atrial fibrillation: Secondary | ICD-10-CM | POA: Diagnosis not present

## 2022-11-05 DIAGNOSIS — E7800 Pure hypercholesterolemia, unspecified: Secondary | ICD-10-CM

## 2022-11-05 LAB — BASIC METABOLIC PANEL
BUN/Creatinine Ratio: 14 (ref 12–28)
BUN: 16 mg/dL (ref 8–27)
CO2: 28 mmol/L (ref 20–29)
Calcium: 9 mg/dL (ref 8.7–10.3)
Chloride: 102 mmol/L (ref 96–106)
Creatinine, Ser: 1.15 mg/dL — ABNORMAL HIGH (ref 0.57–1.00)
Glucose: 159 mg/dL — ABNORMAL HIGH (ref 70–99)
Potassium: 4.2 mmol/L (ref 3.5–5.2)
Sodium: 141 mmol/L (ref 134–144)
eGFR: 51 mL/min/{1.73_m2} — ABNORMAL LOW (ref >59)

## 2022-11-05 LAB — CBC
Hematocrit: 33.8 % — ABNORMAL LOW (ref 34.0–46.6)
Hemoglobin: 10.8 g/dL — ABNORMAL LOW (ref 11.1–15.9)
MCH: 21.8 pg — ABNORMAL LOW (ref 26.6–33.0)
MCHC: 32 g/dL (ref 31.5–35.7)
MCV: 68 fL — ABNORMAL LOW (ref 79–97)
Platelets: 283 10*3/uL (ref 150–450)
RBC: 4.95 x10E6/uL (ref 3.77–5.28)
RDW: 17.4 % — ABNORMAL HIGH (ref 11.7–15.4)
WBC: 7.9 10*3/uL (ref 3.4–10.8)

## 2022-11-05 NOTE — Telephone Encounter (Addendum)
Cardiac Catheterization scheduled at [facility] for: Thursday November 06, 2022 10:30 AM Arrival time and place: [place] Entrance A at: 8:30 AM  Nothing to eat after midnight prior to procedure, clear liquids until 5 AM day of procedure.  Medication instructions: -Hold:  Eliquis-none 11/04/22 until post procedure  Lasix-AM of procedure  Ozempic-weekly on Thursdays-will hold until post procedure -Except hold medications usual morning medications can be taken with sips of water including aspirin 81 mg.  Confirmed patient has responsible adult to drive home post procedure and be with patient first 24 hours after arriving home.  Patient reports no new symptoms concerning for COVID-19 in the past 10 days.  Reviewed procedure instructions with patient.

## 2022-11-05 NOTE — Patient Instructions (Signed)
Medication Instructions:  Your physician recommends that you continue on your current medications as directed. Please refer to the Current Medication list given to you today.  *If you need a refill on your cardiac medications before your next appointment, please call your pharmacy*  Follow-Up: At [facility], you and your health needs are our priority.  As part of our continuing mission to provide you with exceptional heart care, we have created designated Provider Care Teams.  These Care Teams include your primary Cardiologist (physician) and Advanced Practice Providers (APPs -  Physician Assistants and Nurse Practitioners) who all work together to provide you with the care you need, when you need it.  Your next appointment:   6 month(s)  The format for your next appointment:   In Person  Provider:   Will Meredith Leeds, MD  Other Instructions [facility] [place]  [address] [place] [address] [phone]  [email or web address]  They are open Monday through Friday, it is recommended you go to their office to be fitted for compression stockings. If you are unable to get to [place] you can measure your calves, ankles and shoe size and share this with them over the phone to find the proper size for you.   Important Information About Sugar

## 2022-11-06 ENCOUNTER — Ambulatory Visit (HOSPITAL_COMMUNITY)
Admission: RE | Admit: 2022-11-06 | Discharge: 2022-11-06 | Disposition: A | Discharge status: Home / Self Care | Payer: BC Managed Care – PPO | Source: Ambulatory Visit | Attending: Cardiovascular Disease | Admitting: Cardiovascular Disease

## 2022-11-06 ENCOUNTER — Other Ambulatory Visit: Payer: Self-pay

## 2022-11-06 ENCOUNTER — Encounter (HOSPITAL_COMMUNITY)
Admission: RE | Disposition: A | Discharge status: Home / Self Care | Payer: Self-pay | Source: Ambulatory Visit | Attending: Cardiovascular Disease

## 2022-11-06 DIAGNOSIS — I4819 Other persistent atrial fibrillation: Secondary | ICD-10-CM | POA: Insufficient documentation

## 2022-11-06 DIAGNOSIS — I2510 Atherosclerotic heart disease of native coronary artery without angina pectoris: Secondary | ICD-10-CM | POA: Insufficient documentation

## 2022-11-06 DIAGNOSIS — I2584 Coronary atherosclerosis due to calcified coronary lesion: Secondary | ICD-10-CM | POA: Diagnosis not present

## 2022-11-06 DIAGNOSIS — I480 Paroxysmal atrial fibrillation: Secondary | ICD-10-CM

## 2022-11-06 HISTORY — PX: LEFT HEART CATH AND CORONARY ANGIOGRAPHY: CATH118249

## 2022-11-06 LAB — NO BLOOD PRODUCTS

## 2022-11-06 LAB — GLUCOSE, CAPILLARY
Glucose-Capillary: 120 mg/dL — ABNORMAL HIGH (ref 70–99)
Glucose-Capillary: 104 mg/dL — ABNORMAL HIGH (ref 70–99)

## 2022-11-06 SURGERY — LEFT HEART CATH AND CORONARY ANGIOGRAPHY
Anesthesia: LOCAL

## 2022-11-06 MED ORDER — SODIUM CHLORIDE 0.9% FLUSH: 3.0000 mL | INTRAVENOUS | Status: DC | PRN

## 2022-11-06 MED ORDER — HEPARIN (PORCINE) IN NACL 1000-0.9 UT/500ML-% IV SOLN
INTRAVENOUS | Status: AC
  Filled 2022-11-06: qty 1000

## 2022-11-06 MED ORDER — APIXABAN 5 MG PO TABS: 5.0000 mg | ORAL_TABLET | Freq: Two times a day (BID) | ORAL | 1 refills | Status: DC

## 2022-11-06 MED ORDER — HEPARIN SODIUM (PORCINE) 1000 UNIT/ML IJ SOLN
INTRAMUSCULAR | Status: DC | PRN
  Administered 2022-11-06: 5000 [IU] via INTRAVENOUS

## 2022-11-06 MED ORDER — HYDRALAZINE HCL 20 MG/ML IJ SOLN: 10.0000 mg | INTRAMUSCULAR | Status: DC | PRN

## 2022-11-06 MED ORDER — FENTANYL CITRATE (PF) 100 MCG/2ML IJ SOLN
INTRAMUSCULAR | Status: AC
  Filled 2022-11-06: qty 2

## 2022-11-06 MED ORDER — ONDANSETRON HCL 4 MG/2ML IJ SOLN: 4.0000 mg | Freq: Four times a day (QID) | INTRAMUSCULAR | Status: DC | PRN

## 2022-11-06 MED ORDER — SODIUM CHLORIDE 0.9% FLUSH: 3.0000 mL | Freq: Two times a day (BID) | INTRAVENOUS | Status: DC

## 2022-11-06 MED ORDER — SODIUM CHLORIDE 0.9 % WEIGHT BASED INFUSION
3.0000 mL/kg/h | INTRAVENOUS | Status: AC
  Administered 2022-11-06: 3 mL/kg/h via INTRAVENOUS

## 2022-11-06 MED ORDER — SODIUM CHLORIDE 0.9 % IV SOLN: INTRAVENOUS | Status: AC

## 2022-11-06 MED ORDER — MIDAZOLAM HCL 2 MG/2ML IJ SOLN
INTRAMUSCULAR | Status: DC | PRN
  Administered 2022-11-06: 2 mg via INTRAVENOUS

## 2022-11-06 MED ORDER — SODIUM CHLORIDE 0.9 % IV SOLN: 250.0000 mL | INTRAVENOUS | Status: DC | PRN

## 2022-11-06 MED ORDER — HEPARIN SODIUM (PORCINE) 1000 UNIT/ML IJ SOLN
INTRAMUSCULAR | Status: AC
  Filled 2022-11-06: qty 10

## 2022-11-06 MED ORDER — LABETALOL HCL 5 MG/ML IV SOLN: 10.0000 mg | INTRAVENOUS | Status: DC | PRN

## 2022-11-06 MED ORDER — HEPARIN (PORCINE) IN NACL 1000-0.9 UT/500ML-% IV SOLN
INTRAVENOUS | Status: DC | PRN
  Administered 2022-11-06 (×2): 500 mL

## 2022-11-06 MED ORDER — ACETAMINOPHEN 325 MG PO TABS: 650.0000 mg | ORAL_TABLET | ORAL | Status: DC | PRN

## 2022-11-06 MED ORDER — VERAPAMIL HCL 2.5 MG/ML IV SOLN
INTRAVENOUS | Status: DC | PRN
  Administered 2022-11-06: 10 mL via INTRA_ARTERIAL

## 2022-11-06 MED ORDER — IOHEXOL 350 MG/ML SOLN
INTRAVENOUS | Status: DC | PRN
  Administered 2022-11-06: 50 mL

## 2022-11-06 MED ORDER — FENTANYL CITRATE (PF) 100 MCG/2ML IJ SOLN
INTRAMUSCULAR | Status: DC | PRN
  Administered 2022-11-06: 50 ug via INTRAVENOUS

## 2022-11-06 MED ORDER — LIDOCAINE HCL (PF) 1 % IJ SOLN
INTRAMUSCULAR | Status: DC | PRN
  Administered 2022-11-06: 2 mL

## 2022-11-06 MED ORDER — SODIUM CHLORIDE 0.9 % WEIGHT BASED INFUSION: 1.0000 mL/kg/h | INTRAVENOUS | Status: DC

## 2022-11-06 MED ORDER — MIDAZOLAM HCL 2 MG/2ML IJ SOLN
INTRAMUSCULAR | Status: AC
  Filled 2022-11-06: qty 2

## 2022-11-06 MED ORDER — VERAPAMIL HCL 2.5 MG/ML IV SOLN
INTRAVENOUS | Status: AC
  Filled 2022-11-06: qty 2

## 2022-11-06 MED ORDER — ASPIRIN 81 MG PO CHEW: 81.0000 mg | CHEWABLE_TABLET | ORAL | Status: DC

## 2022-11-06 MED ORDER — LIDOCAINE HCL (PF) 1 % IJ SOLN
INTRAMUSCULAR | Status: AC
  Filled 2022-11-06: qty 30

## 2022-11-06 SURGICAL SUPPLY — 13 items
CATH 5FR JL3.5 JR4 ANG PIG MP (CATHETERS) IMPLANT
CATH INFINITI 5FR JL4 (CATHETERS) IMPLANT
DEVICE RAD COMP TR BAND LRG (VASCULAR PRODUCTS) IMPLANT
GLIDESHEATH SLEND SS 6F .021 (SHEATH) IMPLANT
GUIDEWIRE INQWIRE 1.5J.035X260 (WIRE) IMPLANT
INQWIRE 1.5J .035X260CM (WIRE) ×1
KIT HEART LEFT (KITS) ×2 IMPLANT
PACK CARDIAC CATHETERIZATION (CUSTOM PROCEDURE TRAY) ×2 IMPLANT
PROTECTION STATION PRESSURIZED (MISCELLANEOUS) ×1
SHEATH PROBE COVER 6X72 (BAG) IMPLANT
STATION PROTECTION PRESSURIZED (MISCELLANEOUS) IMPLANT
TRANSDUCER W/STOPCOCK (MISCELLANEOUS) ×2 IMPLANT
TUBING CIL FLEX 10 FLL-RA (TUBING) ×2 IMPLANT

## 2022-11-06 NOTE — Interval H&P Note (Signed)
Cath Lab Visit (complete for each Cath Lab visit)  Clinical Evaluation Leading to the Procedure:   ACS: No.  Non-ACS:    Anginal Classification: No Symptoms  Anti-ischemic medical therapy: Minimal Therapy (1 class of medications)  Non-Invasive Test Results: No non-invasive testing performed  Prior CABG: No previous CABG   Cath being done prior to convergent procedure.    History and Physical Interval Note:  11/06/2022 10:12 AM  Horald Pollen Alcaide  has presented today for surgery, with the diagnosis of cad.  The various methods of treatment have been discussed with the patient and family. After consideration of risks, benefits and other options for treatment, the patient has consented to  Procedure(s): LEFT HEART CATH AND CORONARY ANGIOGRAPHY (N/A) as a surgical intervention.  The patient's history has been reviewed, patient examined, no change in status, stable for surgery.  I have reviewed the patient's chart and labs.  Questions were answered to the patient's satisfaction.     Larae Grooms

## 2022-11-07 ENCOUNTER — Encounter (HOSPITAL_COMMUNITY): Payer: Self-pay | Admitting: Interventional Cardiology

## 2022-11-11 ENCOUNTER — Other Ambulatory Visit: Payer: Self-pay | Admitting: *Deleted

## 2022-11-11 MED ORDER — COLCHICINE 0.6 MG PO TABS: 0.6000 mg | ORAL_TABLET | Freq: Every day | ORAL | 0 refills | Status: DC

## 2022-11-11 NOTE — Progress Notes (Signed)
Per Dr. Tenny Craw, 0.'6mg'$  of colchicine to be taken daily for 6 days prior to upcoming convergent procedure. Medication sent to patient's preferred pharmacy, patient aware.

## 2022-11-12 NOTE — Pre-Procedure Instructions (Signed)
Surgical Instructions    Your procedure is scheduled on Monday, January 8.  Report to [facility] Main Entrance "A" at 5:30 A.M., then check in with the Admitting office.  Call this number if you have problems the morning of surgery:  [phone]   If you have any questions prior to your surgery date call [phone]: Open Monday-Friday 8am-4pm If you experience any cold or flu symptoms such as cough, fever, chills, shortness of breath, etc. between now and your scheduled surgery, please notify us at the above number     Remember:  Do not eat or drink after midnight the night before your surgery    Take these medicines the morning of surgery with A SIP OF WATER:  atorvastatin (LIPITOR)  colchicine  diltiazem (TIAZAC)  gabapentin (NEURONTIN)  metoprolol tartrate (LOPRESSOR  acetaminophen (TYLENOL) if needed clonazePAM (KLONOPIN)  if needed  Per your surgeon's instructions:  **LAST DOSE OF ELIQUIS TAKEN WILL BE SATURDAY 1/6 MORNING** **STOP METFORMIN 2 DAYS PRIOR TO SURGERY** **STOP OZEMPIC 1 WEEK PRIOR TO SURGERY** **START TAKING NEW RX: COLCHICINE 1 WEEK PRIOR TO SURGERY, WE WILL CALL THIS INTO YOUR PHARMACY**  As of today, STOP taking any Aspirin (unless otherwise instructed by your surgeon) Aleve, Naproxen, Ibuprofen, Motrin, Advil, Goody's, BC's, all herbal medications, fish oil, and all vitamins.     WHAT DO I DO ABOUT MY DIABETES MEDICATION?   - DO NOT take Semaglutide (Ozempic) for 7 days prior to surgery. Do not take Ozempic after 11/09/22.   The day of surgery, do not take other diabetes injectables, including Byetta (exenatide), Bydureon (exenatide ER), Victoza (liraglutide), or Trulicity (dulaglutide).   HOW TO MANAGE YOUR DIABETES BEFORE AND AFTER SURGERY  Why is it important to control my blood sugar before and after surgery? Improving blood sugar levels before and after surgery helps healing and can limit problems. A way of improving blood sugar control is  eating a healthy diet by:  Eating less sugar and carbohydrates  Increasing activity/exercise  Talking with your doctor about reaching your blood sugar goals High blood sugars (greater than 180 mg/dL) can raise your risk of infections and slow your recovery, so you will need to focus on controlling your diabetes during the weeks before surgery. Make sure that the doctor who takes care of your diabetes knows about your planned surgery including the date and location.  How do I manage my blood sugar before surgery? Check your blood sugar at least 4 times a day, starting 2 days before surgery, to make sure that the level is not too high or low.  Check your blood sugar the morning of your surgery when you wake up and every 2 hours until you get to the Short Stay unit.  If your blood sugar is less than 70 mg/dL, you will need to treat for low blood sugar: Do not take insulin. Treat a low blood sugar (less than 70 mg/dL) with  cup of clear juice (cranberry or apple), 4 glucose tablets, OR glucose gel. Recheck blood sugar in 15 minutes after treatment (to make sure it is greater than 70 mg/dL). If your blood sugar is not greater than 70 mg/dL on recheck, call [phone] for further instructions. Report your blood sugar to the short stay nurse when you get to Short Stay.  If you are admitted to the hospital after surgery: Your blood sugar will be checked by the staff and you will probably be given insulin after surgery (instead of oral diabetes medicines) to  make sure you have good blood sugar levels. The goal for blood sugar control after surgery is 80-180 mg/dL.         [facility] is not responsible for any belongings or valuables.    Do NOT Smoke (Tobacco/Vaping)  24 hours prior to your procedure  If you use a CPAP at night, you may bring your mask for your overnight stay.   Contacts, glasses, hearing aids, dentures or partials may not be worn into surgery, please bring cases for these  belongings   For patients admitted to the hospital, discharge time will be determined by your treatment team.   Patients discharged the day of surgery will not be allowed to drive home, and someone needs to stay with them for 24 hours.   SURGICAL WAITING ROOM VISITATION Patients having surgery or a procedure may have no more than 2 support people in the waiting area - these visitors may rotate.   Children under the age of 35 must have an adult with them who is not the patient. If the patient needs to stay at the hospital during part of their recovery, the visitor guidelines for inpatient rooms apply. Pre-op nurse will coordinate an appropriate time for 1 support person to accompany patient in pre-op.  This support person may not rotate.   Please refer to [email or web address] for the visitor guidelines for Inpatients (after your surgery is over and you are in a regular room).    Special instructions:    Oral Hygiene is also important to reduce your risk of infection.  Remember - BRUSH YOUR TEETH THE MORNING OF SURGERY WITH YOUR REGULAR TOOTHPASTE   [facility]- Preparing For Surgery  Before surgery, you can play an important role. Because skin is not sterile, your skin needs to be as free of germs as possible. You can reduce the number of germs on your skin by washing with CHG (chlorahexidine gluconate) Soap before surgery.  CHG is an antiseptic cleaner which kills germs and bonds with the skin to continue killing germs even after washing.     Please do not use if you have an allergy to CHG or antibacterial soaps. If your skin becomes reddened/irritated stop using the CHG.  Do not shave (including legs and underarms) for at least 48 hours prior to first CHG shower. It is OK to shave your face.  Please follow these instructions carefully.     Shower the NIGHT BEFORE SURGERY and the MORNING OF SURGERY with CHG Soap.   If you chose to wash  your hair, wash your hair first as usual with your normal shampoo. After you shampoo, rinse your hair and body thoroughly to remove the shampoo.  Then [employer] and genitals (private parts) with your normal soap and rinse thoroughly to remove soap.  After that Use CHG Soap as you would any other liquid soap. You can apply CHG directly to the skin and wash gently with a scrungie or a clean washcloth.   Apply the CHG Soap to your body ONLY FROM THE NECK DOWN.  Do not use on open wounds or open sores. Avoid contact with your eyes, ears, mouth and genitals (private parts). Wash Face and genitals (private parts)  with your normal soap.   Wash thoroughly, paying special attention to the area where your surgery will be performed.  Thoroughly rinse your body with warm water from the neck down.  DO NOT shower/wash with your normal soap after using and rinsing off the CHG Soap.  Pat yourself dry with a CLEAN TOWEL.  Wear CLEAN PAJAMAS to bed the night before surgery  Place CLEAN SHEETS on your bed the night before your surgery  DO NOT SLEEP WITH PETS.   Day of Surgery:  Take a shower with CHG soap. Wear Clean/Comfortable clothing the morning of surgery Do not wear jewelry or makeup. Do not wear lotions, powders, perfumes/cologne or deodorant. Do not shave 48 hours prior to surgery.  Men may shave face and neck. Do not bring valuables to the hospital. Do not wear nail polish, gel polish, artificial nails, or any other type of covering on natural nails (fingers and toes) If you have artificial nails or gel coating that need to be removed by a nail salon, please have this removed prior to surgery. Artificial nails or gel coating may interfere with anesthesia's ability to adequately monitor your vital signs.  Remember to brush your teeth WITH YOUR REGULAR TOOTHPASTE.    If you received a COVID test during your pre-op visit, it is requested that you wear a mask when out in public, stay away from  anyone that may not be feeling well, and notify your surgeon if you develop symptoms. If you have been in contact with anyone that has tested positive in the last 10 days, please notify your surgeon.    Please read over the following fact sheets that you were given.

## 2022-11-13 ENCOUNTER — Encounter (HOSPITAL_COMMUNITY): Payer: Self-pay | Admitting: *Deleted

## 2022-11-13 ENCOUNTER — Ambulatory Visit (HOSPITAL_COMMUNITY)
Admission: RE | Admit: 2022-11-13 | Discharge: 2022-11-13 | Disposition: A | Discharge status: Home / Self Care | Payer: BC Managed Care – PPO | Source: Ambulatory Visit | Attending: Cardiothoracic Surgery | Admitting: Cardiothoracic Surgery

## 2022-11-13 ENCOUNTER — Other Ambulatory Visit: Payer: Self-pay

## 2022-11-13 ENCOUNTER — Encounter (HOSPITAL_COMMUNITY)
Admission: RE | Admit: 2022-11-13 | Discharge: 2022-11-13 | Disposition: A | Discharge status: Home / Self Care | Payer: BC Managed Care – PPO | Source: Ambulatory Visit | Attending: Cardiothoracic Surgery | Admitting: Cardiothoracic Surgery

## 2022-11-13 VITALS — BP 115/70 | HR 107 | Temp 98.0°F | Resp 15 | Ht 66.0 in | Wt 215.6 lb

## 2022-11-13 DIAGNOSIS — E119 Type 2 diabetes mellitus without complications: Secondary | ICD-10-CM | POA: Insufficient documentation

## 2022-11-13 DIAGNOSIS — Z01818 Encounter for other preprocedural examination: Secondary | ICD-10-CM | POA: Insufficient documentation

## 2022-11-13 DIAGNOSIS — F17200 Nicotine dependence, unspecified, uncomplicated: Secondary | ICD-10-CM | POA: Insufficient documentation

## 2022-11-13 DIAGNOSIS — I4819 Other persistent atrial fibrillation: Secondary | ICD-10-CM | POA: Insufficient documentation

## 2022-11-13 DIAGNOSIS — E785 Hyperlipidemia, unspecified: Secondary | ICD-10-CM | POA: Insufficient documentation

## 2022-11-13 DIAGNOSIS — I10 Essential (primary) hypertension: Secondary | ICD-10-CM | POA: Diagnosis not present

## 2022-11-13 DIAGNOSIS — Z1152 Encounter for screening for COVID-19: Secondary | ICD-10-CM | POA: Insufficient documentation

## 2022-11-13 HISTORY — DX: Spinal stenosis, site unspecified: M48.00

## 2022-11-13 LAB — CBC
HCT: 34.9 % — ABNORMAL LOW (ref 36.0–46.0)
Hemoglobin: 10.7 g/dL — ABNORMAL LOW (ref 12.0–15.0)
MCH: 21.6 pg — ABNORMAL LOW (ref 26.0–34.0)
MCHC: 30.7 g/dL (ref 30.0–36.0)
MCV: 70.5 fL — ABNORMAL LOW (ref 80.0–100.0)
Platelets: 273 10*3/uL (ref 150–400)
RBC: 4.95 MIL/uL (ref 3.87–5.11)
RDW: 15.5 % (ref 11.5–15.5)
WBC: 8.5 10*3/uL (ref 4.0–10.5)
nRBC: 0 % (ref 0.0–0.2)

## 2022-11-13 LAB — BLOOD GAS, ARTERIAL
Acid-Base Excess: 8.8 mmol/L — ABNORMAL HIGH (ref 0.0–2.0)
Bicarbonate: 34.1 mmol/L — ABNORMAL HIGH (ref 20.0–28.0)
Drawn by: 58743
O2 Saturation: 98.7 %
Patient temperature: 37
pCO2 arterial: 48 mmHg (ref 32–48)
pH, Arterial: 7.46 — ABNORMAL HIGH (ref 7.35–7.45)
pO2, Arterial: 93 mmHg (ref 83–108)

## 2022-11-13 LAB — GLUCOSE, CAPILLARY: Glucose-Capillary: 124 mg/dL — ABNORMAL HIGH (ref 70–99)

## 2022-11-13 LAB — COMPREHENSIVE METABOLIC PANEL
ALT: 15 U/L (ref 0–44)
AST: 23 U/L (ref 15–41)
Albumin: 3.5 g/dL (ref 3.5–5.0)
Alkaline Phosphatase: 69 U/L (ref 38–126)
Anion gap: 10 (ref 5–15)
BUN: 17 mg/dL (ref 8–23)
CO2: 27 mmol/L (ref 22–32)
Calcium: 8.8 mg/dL — ABNORMAL LOW (ref 8.9–10.3)
Chloride: 101 mmol/L (ref 98–111)
Creatinine, Ser: 1.04 mg/dL — ABNORMAL HIGH (ref 0.44–1.00)
GFR, Estimated: 58 mL/min — ABNORMAL LOW (ref >60)
Glucose, Bld: 106 mg/dL — ABNORMAL HIGH (ref 70–99)
Potassium: 4 mmol/L (ref 3.5–5.1)
Sodium: 138 mmol/L (ref 135–145)
Total Bilirubin: 0.5 mg/dL (ref 0.3–1.2)
Total Protein: 6.5 g/dL (ref 6.5–8.1)

## 2022-11-13 LAB — URINALYSIS, ROUTINE W REFLEX MICROSCOPIC
Bilirubin Urine: NEGATIVE
Glucose, UA: NEGATIVE mg/dL
Hgb urine dipstick: NEGATIVE
Ketones, ur: NEGATIVE mg/dL
Nitrite: NEGATIVE
Protein, ur: NEGATIVE mg/dL
Specific Gravity, Urine: 1.008 (ref 1.005–1.030)
pH: 7 (ref 5.0–8.0)

## 2022-11-13 LAB — SURGICAL PCR SCREEN
MRSA, PCR: NEGATIVE
Staphylococcus aureus: NEGATIVE

## 2022-11-13 LAB — APTT: aPTT: 32 seconds (ref 24–36)

## 2022-11-13 LAB — PROTIME-INR
INR: 1.2 (ref 0.8–1.2)
Prothrombin Time: 15.3 seconds — ABNORMAL HIGH (ref 11.4–15.2)

## 2022-11-13 NOTE — Progress Notes (Signed)
VASCULAR LAB    Carotid duplex has been performed.  See CV proc for preliminary results.   Antonia Culbertson, RVT 11/13/2022, 10:29 AM

## 2022-11-13 NOTE — Progress Notes (Signed)
PCP - Dr. Cristie Hem Cardiologist - EP- Dr. Allegra Lai  PPM/ICD - denies   Chest x-ray - 11/13/22 EKG - 11/06/22 Stress Test - 05/20/12 ECHO - 01/09/22 Cardiac Cath - 11/06/22  Sleep Study - 05/06/22, OSA+ CPAP - denies, unable to tolerate  Fasting Blood Sugar - 120-130 Checks Blood Sugar twice a day  Last dose of GLP1 agonist-  11/01/22 GLP1 instructions: Hold 7 days prior to surgery  Blood Thinner Instructions: Eliquis: Last dose 1/6 Aspirin Instructions: n/a  ERAS Protcol - no, NPO   COVID TEST- 11/13/22   Anesthesia review: yes, cardiac hx  Patient denies shortness of breath, fever, cough and chest pain at PAT appointment   All instructions explained to the patient, with a verbal understanding of the material. Patient agrees to go over the instructions while at home for a better understanding. Patient also instructed to wear a mask in public after being tested for COVID-19. The opportunity to ask questions was provided.

## 2022-11-14 LAB — HEMOGLOBIN A1C
Hgb A1c MFr Bld: 6.1 % — ABNORMAL HIGH (ref 4.8–5.6)
Mean Plasma Glucose: 128 mg/dL

## 2022-11-14 LAB — SARS CORONAVIRUS 2 (TAT 6-24 HRS): SARS Coronavirus 2: NEGATIVE

## 2022-11-17 ENCOUNTER — Inpatient Hospital Stay (HOSPITAL_COMMUNITY): Payer: BC Managed Care – PPO

## 2022-11-17 ENCOUNTER — Inpatient Hospital Stay (HOSPITAL_COMMUNITY): Payer: BC Managed Care – PPO | Admitting: Vascular Surgery

## 2022-11-17 ENCOUNTER — Inpatient Hospital Stay (HOSPITAL_COMMUNITY)
Admission: RE | Admit: 2022-11-17 | Discharge: 2022-11-20 | DRG: 229 | Disposition: A | Discharge status: Home / Self Care | Payer: BC Managed Care – PPO | Attending: Cardiothoracic Surgery | Admitting: Cardiothoracic Surgery

## 2022-11-17 ENCOUNTER — Other Ambulatory Visit: Payer: Self-pay

## 2022-11-17 ENCOUNTER — Encounter (HOSPITAL_COMMUNITY)
Admission: RE | Disposition: A | Discharge status: Home / Self Care | Payer: Self-pay | Source: Home / Self Care | Attending: Cardiothoracic Surgery

## 2022-11-17 ENCOUNTER — Encounter (HOSPITAL_COMMUNITY): Payer: Self-pay | Admitting: Cardiothoracic Surgery

## 2022-11-17 DIAGNOSIS — Z6841 Body Mass Index (BMI) 40.0 and over, adult: Secondary | ICD-10-CM

## 2022-11-17 DIAGNOSIS — I119 Hypertensive heart disease without heart failure: Secondary | ICD-10-CM | POA: Diagnosis not present

## 2022-11-17 DIAGNOSIS — I517 Cardiomegaly: Secondary | ICD-10-CM | POA: Diagnosis not present

## 2022-11-17 DIAGNOSIS — I499 Cardiac arrhythmia, unspecified: Secondary | ICD-10-CM | POA: Diagnosis not present

## 2022-11-17 DIAGNOSIS — G4733 Obstructive sleep apnea (adult) (pediatric): Secondary | ICD-10-CM | POA: Diagnosis present

## 2022-11-17 DIAGNOSIS — Z800 Family history of malignant neoplasm of digestive organs: Secondary | ICD-10-CM

## 2022-11-17 DIAGNOSIS — K219 Gastro-esophageal reflux disease without esophagitis: Secondary | ICD-10-CM | POA: Diagnosis present

## 2022-11-17 DIAGNOSIS — Z885 Allergy status to narcotic agent status: Secondary | ICD-10-CM | POA: Diagnosis not present

## 2022-11-17 DIAGNOSIS — Z87891 Personal history of nicotine dependence: Secondary | ICD-10-CM | POA: Diagnosis not present

## 2022-11-17 DIAGNOSIS — Z833 Family history of diabetes mellitus: Secondary | ICD-10-CM

## 2022-11-17 DIAGNOSIS — Z823 Family history of stroke: Secondary | ICD-10-CM | POA: Diagnosis not present

## 2022-11-17 DIAGNOSIS — J9811 Atelectasis: Secondary | ICD-10-CM | POA: Diagnosis not present

## 2022-11-17 DIAGNOSIS — I2510 Atherosclerotic heart disease of native coronary artery without angina pectoris: Secondary | ICD-10-CM | POA: Diagnosis not present

## 2022-11-17 DIAGNOSIS — I493 Ventricular premature depolarization: Secondary | ICD-10-CM | POA: Diagnosis not present

## 2022-11-17 DIAGNOSIS — Z7984 Long term (current) use of oral hypoglycemic drugs: Secondary | ICD-10-CM | POA: Diagnosis not present

## 2022-11-17 DIAGNOSIS — Z79899 Other long term (current) drug therapy: Secondary | ICD-10-CM

## 2022-11-17 DIAGNOSIS — Z8249 Family history of ischemic heart disease and other diseases of the circulatory system: Secondary | ICD-10-CM

## 2022-11-17 DIAGNOSIS — F17210 Nicotine dependence, cigarettes, uncomplicated: Secondary | ICD-10-CM | POA: Diagnosis not present

## 2022-11-17 DIAGNOSIS — E785 Hyperlipidemia, unspecified: Secondary | ICD-10-CM | POA: Diagnosis present

## 2022-11-17 DIAGNOSIS — E669 Obesity, unspecified: Secondary | ICD-10-CM | POA: Diagnosis present

## 2022-11-17 DIAGNOSIS — Z7901 Long term (current) use of anticoagulants: Secondary | ICD-10-CM

## 2022-11-17 DIAGNOSIS — I4891 Unspecified atrial fibrillation: Secondary | ICD-10-CM | POA: Diagnosis not present

## 2022-11-17 DIAGNOSIS — Z6834 Body mass index (BMI) 34.0-34.9, adult: Secondary | ICD-10-CM

## 2022-11-17 DIAGNOSIS — I700 Atherosclerosis of aorta: Secondary | ICD-10-CM | POA: Diagnosis not present

## 2022-11-17 DIAGNOSIS — R042 Hemoptysis: Secondary | ICD-10-CM | POA: Diagnosis not present

## 2022-11-17 DIAGNOSIS — I4819 Other persistent atrial fibrillation: Principal | ICD-10-CM | POA: Diagnosis present

## 2022-11-17 DIAGNOSIS — Z91199 Patient's noncompliance with other medical treatment and regimen due to unspecified reason: Secondary | ICD-10-CM | POA: Diagnosis not present

## 2022-11-17 DIAGNOSIS — I10 Essential (primary) hypertension: Secondary | ICD-10-CM | POA: Diagnosis not present

## 2022-11-17 DIAGNOSIS — R5383 Other fatigue: Secondary | ICD-10-CM | POA: Diagnosis present

## 2022-11-17 DIAGNOSIS — E119 Type 2 diabetes mellitus without complications: Secondary | ICD-10-CM

## 2022-11-17 DIAGNOSIS — Z888 Allergy status to other drugs, medicaments and biological substances status: Secondary | ICD-10-CM

## 2022-11-17 HISTORY — PX: CLIPPING OF ATRIAL APPENDAGE: SHX5773

## 2022-11-17 HISTORY — PX: TEE WITHOUT CARDIOVERSION: SHX5443

## 2022-11-17 LAB — APTT: aPTT: 27 seconds (ref 24–36)

## 2022-11-17 LAB — POCT I-STAT 7, (LYTES, BLD GAS, ICA,H+H)
Acid-Base Excess: 1 mmol/L (ref 0.0–2.0)
Bicarbonate: 27.6 mmol/L (ref 20.0–28.0)
Calcium, Ion: 1.08 mmol/L — ABNORMAL LOW (ref 1.15–1.40)
HCT: 32 % — ABNORMAL LOW (ref 36.0–46.0)
Hemoglobin: 10.9 g/dL — ABNORMAL LOW (ref 12.0–15.0)
O2 Saturation: 100 %
Potassium: 3.8 mmol/L (ref 3.5–5.1)
Sodium: 139 mmol/L (ref 135–145)
TCO2: 29 mmol/L (ref 22–32)
pCO2 arterial: 53.1 mmHg — ABNORMAL HIGH (ref 32–48)
pH, Arterial: 7.325 — ABNORMAL LOW (ref 7.35–7.45)
pO2, Arterial: 539 mmHg — ABNORMAL HIGH (ref 83–108)

## 2022-11-17 LAB — BASIC METABOLIC PANEL
Anion gap: 11 (ref 5–15)
BUN: 20 mg/dL (ref 8–23)
CO2: 25 mmol/L (ref 22–32)
Calcium: 7.7 mg/dL — ABNORMAL LOW (ref 8.9–10.3)
Chloride: 102 mmol/L (ref 98–111)
Creatinine, Ser: 1.17 mg/dL — ABNORMAL HIGH (ref 0.44–1.00)
GFR, Estimated: 50 mL/min — ABNORMAL LOW (ref >60)
Glucose, Bld: 161 mg/dL — ABNORMAL HIGH (ref 70–99)
Potassium: 4.7 mmol/L (ref 3.5–5.1)
Sodium: 138 mmol/L (ref 135–145)

## 2022-11-17 LAB — PROTIME-INR
INR: 1.2 (ref 0.8–1.2)
Prothrombin Time: 15.3 seconds — ABNORMAL HIGH (ref 11.4–15.2)

## 2022-11-17 LAB — POCT I-STAT, CHEM 8
BUN: 18 mg/dL (ref 8–23)
Calcium, Ion: 1.06 mmol/L — ABNORMAL LOW (ref 1.15–1.40)
Chloride: 100 mmol/L (ref 98–111)
Creatinine, Ser: 1 mg/dL (ref 0.44–1.00)
Glucose, Bld: 191 mg/dL — ABNORMAL HIGH (ref 70–99)
HCT: 32 % — ABNORMAL LOW (ref 36.0–46.0)
Hemoglobin: 10.9 g/dL — ABNORMAL LOW (ref 12.0–15.0)
Potassium: 3.9 mmol/L (ref 3.5–5.1)
Sodium: 140 mmol/L (ref 135–145)
TCO2: 30 mmol/L (ref 22–32)

## 2022-11-17 LAB — CBC
HCT: 33.5 % — ABNORMAL LOW (ref 36.0–46.0)
Hemoglobin: 10.1 g/dL — ABNORMAL LOW (ref 12.0–15.0)
MCH: 21.6 pg — ABNORMAL LOW (ref 26.0–34.0)
MCHC: 30.1 g/dL (ref 30.0–36.0)
MCV: 71.6 fL — ABNORMAL LOW (ref 80.0–100.0)
Platelets: 260 10*3/uL (ref 150–400)
RBC: 4.68 MIL/uL (ref 3.87–5.11)
RDW: 15.5 % (ref 11.5–15.5)
WBC: 14 10*3/uL — ABNORMAL HIGH (ref 4.0–10.5)
nRBC: 0 % (ref 0.0–0.2)

## 2022-11-17 LAB — BLOOD GAS, ARTERIAL
Acid-Base Excess: 0.6 mmol/L (ref 0.0–2.0)
Bicarbonate: 27.9 mmol/L (ref 20.0–28.0)
Drawn by: 44475
O2 Saturation: 95.1 %
Patient temperature: 36.5
pCO2 arterial: 57 mmHg — ABNORMAL HIGH (ref 32–48)
pH, Arterial: 7.3 — ABNORMAL LOW (ref 7.35–7.45)
pO2, Arterial: 73 mmHg — ABNORMAL LOW (ref 83–108)

## 2022-11-17 LAB — NO BLOOD PRODUCTS

## 2022-11-17 LAB — MAGNESIUM: Magnesium: 1.9 mg/dL (ref 1.7–2.4)

## 2022-11-17 LAB — GLUCOSE, CAPILLARY
Glucose-Capillary: 138 mg/dL — ABNORMAL HIGH (ref 70–99)
Glucose-Capillary: 180 mg/dL — ABNORMAL HIGH (ref 70–99)

## 2022-11-17 LAB — ECHO INTRAOPERATIVE TEE
Height: 66 in
Weight: 3360 oz

## 2022-11-17 SURGERY — MAZE PROCEDURE, CONVERGENT
Anesthesia: General | Site: Chest

## 2022-11-17 MED ORDER — FENTANYL CITRATE (PF) 250 MCG/5ML IJ SOLN
INTRAMUSCULAR | Status: DC | PRN
  Administered 2022-11-17: 50 ug via INTRAVENOUS
  Administered 2022-11-17: 100 ug via INTRAVENOUS

## 2022-11-17 MED ORDER — CHLORHEXIDINE GLUCONATE 0.12 % MT SOLN: 15.0000 mL | Freq: Once | OROMUCOSAL | Status: AC

## 2022-11-17 MED ORDER — TRAMADOL HCL 50 MG PO TABS
50.0000 mg | ORAL_TABLET | ORAL | Status: DC | PRN
  Administered 2022-11-18: 50 mg via ORAL
  Administered 2022-11-18 – 2022-11-19 (×2): 100 mg via ORAL
  Filled 2022-11-17: qty 2
  Filled 2022-11-17: qty 1
  Filled 2022-11-17: qty 2

## 2022-11-17 MED ORDER — PHENYLEPHRINE HCL-NACL 20-0.9 MG/250ML-% IV SOLN
INTRAVENOUS | Status: DC | PRN
  Administered 2022-11-17: 25 ug/min via INTRAVENOUS

## 2022-11-17 MED ORDER — 0.9 % SODIUM CHLORIDE (POUR BTL) OPTIME
TOPICAL | Status: DC | PRN
  Administered 2022-11-17: 6000 mL

## 2022-11-17 MED ORDER — ACETAMINOPHEN 500 MG PO TABS
ORAL_TABLET | ORAL | Status: AC
  Administered 2022-11-17: 1000 mg via ORAL
  Filled 2022-11-17: qty 2

## 2022-11-17 MED ORDER — ONDANSETRON HCL 4 MG/2ML IJ SOLN: 4.0000 mg | Freq: Four times a day (QID) | INTRAMUSCULAR | Status: DC | PRN

## 2022-11-17 MED ORDER — PROPOFOL 10 MG/ML IV BOLUS
INTRAVENOUS | Status: AC
  Filled 2022-11-17: qty 20

## 2022-11-17 MED ORDER — SODIUM CHLORIDE 0.9 % IR SOLN
Status: DC | PRN
  Administered 2022-11-17: 500 mL

## 2022-11-17 MED ORDER — CEFAZOLIN SODIUM-DEXTROSE 2-4 GM/100ML-% IV SOLN
2.0000 g | Freq: Three times a day (TID) | INTRAVENOUS | Status: AC
  Administered 2022-11-17 – 2022-11-19 (×6): 2 g via INTRAVENOUS
  Filled 2022-11-17 (×6): qty 100

## 2022-11-17 MED ORDER — VANCOMYCIN HCL IN DEXTROSE 1-5 GM/200ML-% IV SOLN
1000.0000 mg | Freq: Once | INTRAVENOUS | Status: AC
  Administered 2022-11-18: 1000 mg via INTRAVENOUS
  Filled 2022-11-17: qty 200

## 2022-11-17 MED ORDER — CEFAZOLIN SODIUM-DEXTROSE 2-4 GM/100ML-% IV SOLN
INTRAVENOUS | Status: AC
  Filled 2022-11-17: qty 100

## 2022-11-17 MED ORDER — COLCHICINE 0.6 MG PO TABS
0.6000 mg | ORAL_TABLET | Freq: Every day | ORAL | Status: DC
  Administered 2022-11-17 – 2022-11-20 (×4): 0.6 mg via ORAL
  Filled 2022-11-17 (×4): qty 1

## 2022-11-17 MED ORDER — DIPHENHYDRAMINE HCL 12.5 MG/5ML PO ELIX: 12.5000 mg | ORAL_SOLUTION | Freq: Four times a day (QID) | ORAL | Status: DC | PRN

## 2022-11-17 MED ORDER — ACETAMINOPHEN 500 MG PO TABS: 1000.0000 mg | ORAL_TABLET | Freq: Once | ORAL | Status: AC

## 2022-11-17 MED ORDER — CHLORHEXIDINE GLUCONATE CLOTH 2 % EX PADS: 6.0000 | MEDICATED_PAD | Freq: Every day | CUTANEOUS | Status: DC

## 2022-11-17 MED ORDER — FENTANYL CITRATE (PF) 100 MCG/2ML IJ SOLN
INTRAMUSCULAR | Status: AC
  Filled 2022-11-17: qty 2

## 2022-11-17 MED ORDER — CHLORHEXIDINE GLUCONATE 0.12 % MT SOLN
OROMUCOSAL | Status: AC
  Administered 2022-11-17: 15 mL via OROMUCOSAL
  Filled 2022-11-17: qty 15

## 2022-11-17 MED ORDER — MAGNESIUM SULFATE 4 GM/100ML IV SOLN
4.0000 g | Freq: Once | INTRAVENOUS | Status: DC
  Filled 2022-11-17: qty 100

## 2022-11-17 MED ORDER — LACTATED RINGERS IV SOLN: INTRAVENOUS | Status: DC | PRN

## 2022-11-17 MED ORDER — LIDOCAINE 2% (20 MG/ML) 5 ML SYRINGE
INTRAMUSCULAR | Status: AC
  Filled 2022-11-17: qty 5

## 2022-11-17 MED ORDER — ALBUMIN HUMAN 5 % IV SOLN: 250.0000 mL | INTRAVENOUS | Status: AC | PRN

## 2022-11-17 MED ORDER — PANTOPRAZOLE SODIUM 40 MG PO TBEC
40.0000 mg | DELAYED_RELEASE_TABLET | Freq: Every day | ORAL | Status: DC
  Administered 2022-11-19 – 2022-11-20 (×2): 40 mg via ORAL
  Filled 2022-11-17 (×2): qty 1

## 2022-11-17 MED ORDER — AMISULPRIDE (ANTIEMETIC) 5 MG/2ML IV SOLN: 10.0000 mg | Freq: Once | INTRAVENOUS | Status: DC | PRN

## 2022-11-17 MED ORDER — ASPIRIN 325 MG PO TBEC
325.0000 mg | DELAYED_RELEASE_TABLET | Freq: Every day | ORAL | Status: DC
  Administered 2022-11-18 – 2022-11-19 (×2): 325 mg via ORAL
  Filled 2022-11-17 (×3): qty 1

## 2022-11-17 MED ORDER — ACETAMINOPHEN 500 MG PO TABS
1000.0000 mg | ORAL_TABLET | Freq: Four times a day (QID) | ORAL | Status: DC
  Administered 2022-11-17 – 2022-11-20 (×11): 1000 mg via ORAL
  Filled 2022-11-17 (×9): qty 2

## 2022-11-17 MED ORDER — PHENYLEPHRINE HCL-NACL 20-0.9 MG/250ML-% IV SOLN: 0.0000 ug/min | INTRAVENOUS | Status: DC

## 2022-11-17 MED ORDER — SODIUM CHLORIDE 0.9% FLUSH
3.0000 mL | Freq: Two times a day (BID) | INTRAVENOUS | Status: DC
  Administered 2022-11-18 – 2022-11-20 (×3): 3 mL via INTRAVENOUS

## 2022-11-17 MED ORDER — EPHEDRINE SULFATE-NACL 50-0.9 MG/10ML-% IV SOSY
PREFILLED_SYRINGE | INTRAVENOUS | Status: DC | PRN
  Administered 2022-11-17: 5 mg via INTRAVENOUS
  Administered 2022-11-17: 10 mg via INTRAVENOUS

## 2022-11-17 MED ORDER — LACTATED RINGERS IV SOLN: INTRAVENOUS | Status: DC

## 2022-11-17 MED ORDER — ALBUMIN HUMAN 5 % IV SOLN: INTRAVENOUS | Status: DC | PRN

## 2022-11-17 MED ORDER — MIDAZOLAM HCL 2 MG/2ML IJ SOLN: 2.0000 mg | INTRAMUSCULAR | Status: DC | PRN

## 2022-11-17 MED ORDER — POTASSIUM CHLORIDE 10 MEQ/50ML IV SOLN: 10.0000 meq | INTRAVENOUS | Status: AC

## 2022-11-17 MED ORDER — LACTATED RINGERS IV SOLN: 500.0000 mL | Freq: Once | INTRAVENOUS | Status: DC | PRN

## 2022-11-17 MED ORDER — CHLORHEXIDINE GLUCONATE CLOTH 2 % EX PADS
6.0000 | MEDICATED_PAD | Freq: Every day | CUTANEOUS | Status: DC
  Administered 2022-11-17 – 2022-11-19 (×2): 6 via TOPICAL

## 2022-11-17 MED ORDER — BISACODYL 5 MG PO TBEC
10.0000 mg | DELAYED_RELEASE_TABLET | Freq: Every day | ORAL | Status: DC
  Administered 2022-11-20: 10 mg via ORAL
  Filled 2022-11-17 (×2): qty 2

## 2022-11-17 MED ORDER — MORPHINE SULFATE (PF) 2 MG/ML IV SOLN: 1.0000 mg | INTRAVENOUS | Status: DC | PRN

## 2022-11-17 MED ORDER — MIDAZOLAM HCL 2 MG/2ML IJ SOLN
INTRAMUSCULAR | Status: AC
  Filled 2022-11-17: qty 2

## 2022-11-17 MED ORDER — CHLORHEXIDINE GLUCONATE 0.12 % MT SOLN
15.0000 mL | OROMUCOSAL | Status: AC
  Administered 2022-11-17: 15 mL via OROMUCOSAL
  Filled 2022-11-17: qty 15

## 2022-11-17 MED ORDER — ONDANSETRON HCL 4 MG/2ML IJ SOLN
INTRAMUSCULAR | Status: DC | PRN
  Administered 2022-11-17: 4 mg via INTRAVENOUS

## 2022-11-17 MED ORDER — DEXTROSE 50 % IV SOLN: 0.0000 mL | INTRAVENOUS | Status: DC | PRN

## 2022-11-17 MED ORDER — CEFAZOLIN SODIUM-DEXTROSE 2-4 GM/100ML-% IV SOLN
2.0000 g | INTRAVENOUS | Status: AC
  Administered 2022-11-17 (×2): 2 g via INTRAVENOUS

## 2022-11-17 MED ORDER — FAMOTIDINE IN NACL 20-0.9 MG/50ML-% IV SOLN
20.0000 mg | Freq: Two times a day (BID) | INTRAVENOUS | Status: DC
  Filled 2022-11-17: qty 50

## 2022-11-17 MED ORDER — DIPHENHYDRAMINE HCL 50 MG/ML IJ SOLN: 12.5000 mg | Freq: Four times a day (QID) | INTRAMUSCULAR | Status: DC | PRN

## 2022-11-17 MED ORDER — BUPIVACAINE LIPOSOME 1.3 % IJ SUSP
INTRAMUSCULAR | Status: DC | PRN
  Administered 2022-11-17: 50 mL

## 2022-11-17 MED ORDER — ROCURONIUM BROMIDE 10 MG/ML (PF) SYRINGE
PREFILLED_SYRINGE | INTRAVENOUS | Status: DC | PRN
  Administered 2022-11-17: 50 mg via INTRAVENOUS
  Administered 2022-11-17 (×2): 20 mg via INTRAVENOUS
  Administered 2022-11-17: 30 mg via INTRAVENOUS
  Administered 2022-11-17: 20 mg via INTRAVENOUS

## 2022-11-17 MED ORDER — ASPIRIN 81 MG PO CHEW: 324.0000 mg | CHEWABLE_TABLET | Freq: Every day | ORAL | Status: DC

## 2022-11-17 MED ORDER — ACETAMINOPHEN 160 MG/5ML PO SOLN: 1000.0000 mg | Freq: Four times a day (QID) | ORAL | Status: DC

## 2022-11-17 MED ORDER — MORPHINE SULFATE 1 MG/ML IV SOLN PCA
INTRAVENOUS | Status: DC
  Administered 2022-11-17: 10.5 mL via INTRAVENOUS
  Administered 2022-11-17: 1.5 mg via INTRAVENOUS
  Administered 2022-11-18: 1.5 mL via INTRAVENOUS

## 2022-11-17 MED ORDER — NALOXONE HCL 0.4 MG/ML IJ SOLN: 0.4000 mg | INTRAMUSCULAR | Status: DC | PRN

## 2022-11-17 MED ORDER — BISACODYL 10 MG RE SUPP: 10.0000 mg | Freq: Every day | RECTAL | Status: DC

## 2022-11-17 MED ORDER — FENTANYL CITRATE (PF) 100 MCG/2ML IJ SOLN
25.0000 ug | INTRAMUSCULAR | Status: DC | PRN
  Administered 2022-11-17 (×2): 25 ug via INTRAVENOUS
  Administered 2022-11-17: 50 ug via INTRAVENOUS

## 2022-11-17 MED ORDER — ACETAMINOPHEN 650 MG RE SUPP: 650.0000 mg | Freq: Once | RECTAL | Status: DC

## 2022-11-17 MED ORDER — MIDAZOLAM HCL 5 MG/5ML IJ SOLN
INTRAMUSCULAR | Status: DC | PRN
  Administered 2022-11-17: .5 mg via INTRAVENOUS

## 2022-11-17 MED ORDER — BUPIVACAINE HCL (PF) 0.5 % IJ SOLN
INTRAMUSCULAR | Status: AC
  Filled 2022-11-17: qty 30

## 2022-11-17 MED ORDER — ROCURONIUM BROMIDE 10 MG/ML (PF) SYRINGE
PREFILLED_SYRINGE | INTRAVENOUS | Status: AC
  Filled 2022-11-17: qty 20

## 2022-11-17 MED ORDER — SODIUM CHLORIDE 0.9% FLUSH: 3.0000 mL | INTRAVENOUS | Status: DC | PRN

## 2022-11-17 MED ORDER — SODIUM CHLORIDE 0.9% FLUSH: 9.0000 mL | INTRAVENOUS | Status: DC | PRN

## 2022-11-17 MED ORDER — PHENYLEPHRINE 80 MCG/ML (10ML) SYRINGE FOR IV PUSH (FOR BLOOD PRESSURE SUPPORT)
PREFILLED_SYRINGE | INTRAVENOUS | Status: AC
  Filled 2022-11-17: qty 10

## 2022-11-17 MED ORDER — BUPIVACAINE LIPOSOME 1.3 % IJ SUSP
INTRAMUSCULAR | Status: AC
  Filled 2022-11-17: qty 20

## 2022-11-17 MED ORDER — ACETAMINOPHEN 500 MG PO TABS: 1000.0000 mg | ORAL_TABLET | Freq: Four times a day (QID) | ORAL | Status: DC

## 2022-11-17 MED ORDER — ACETAMINOPHEN 160 MG/5ML PO SOLN: 650.0000 mg | Freq: Once | ORAL | Status: DC

## 2022-11-17 MED ORDER — PROPOFOL 10 MG/ML IV BOLUS
INTRAVENOUS | Status: DC | PRN
  Administered 2022-11-17: 100 mg via INTRAVENOUS

## 2022-11-17 MED ORDER — OXYCODONE HCL 5 MG PO TABS: 5.0000 mg | ORAL_TABLET | ORAL | Status: DC | PRN

## 2022-11-17 MED ORDER — SUGAMMADEX SODIUM 200 MG/2ML IV SOLN
INTRAVENOUS | Status: DC | PRN
  Administered 2022-11-17: 200 mg via INTRAVENOUS

## 2022-11-17 MED ORDER — ORAL CARE MOUTH RINSE: 15.0000 mL | Freq: Once | OROMUCOSAL | Status: AC

## 2022-11-17 MED ORDER — CEFAZOLIN SODIUM 1 G IJ SOLR
INTRAMUSCULAR | Status: AC
  Filled 2022-11-17: qty 20

## 2022-11-17 MED ORDER — LIDOCAINE 2% (20 MG/ML) 5 ML SYRINGE
INTRAMUSCULAR | Status: DC | PRN
  Administered 2022-11-17: 100 mg via INTRAVENOUS

## 2022-11-17 MED ORDER — HYDROCORTISONE SOD SUC (PF) 100 MG IJ SOLR
100.0000 mg | Freq: Three times a day (TID) | INTRAMUSCULAR | Status: AC
  Administered 2022-11-17 – 2022-11-18 (×2): 100 mg via INTRAVENOUS
  Filled 2022-11-17 (×3): qty 2

## 2022-11-17 MED ORDER — DOCUSATE SODIUM 100 MG PO CAPS
200.0000 mg | ORAL_CAPSULE | Freq: Every day | ORAL | Status: DC
  Administered 2022-11-20: 200 mg via ORAL
  Filled 2022-11-17 (×2): qty 2

## 2022-11-17 MED ORDER — NITROGLYCERIN IN D5W 200-5 MCG/ML-% IV SOLN
0.0000 ug/min | INTRAVENOUS | Status: DC
  Filled 2022-11-17: qty 250

## 2022-11-17 MED ORDER — FENTANYL CITRATE (PF) 250 MCG/5ML IJ SOLN
INTRAMUSCULAR | Status: AC
  Filled 2022-11-17: qty 5

## 2022-11-17 MED ORDER — GLYCOPYRROLATE 0.2 MG/ML IJ SOLN
INTRAMUSCULAR | Status: DC | PRN
  Administered 2022-11-17 (×2): .2 mg via INTRAVENOUS

## 2022-11-17 SURGICAL SUPPLY — 65 items
ADH SKN CLS APL DERMABOND .7 (GAUZE/BANDAGES/DRESSINGS) ×2
ARTICLIP LAA PROCLIP II 45 (Clip) ×2 IMPLANT
BLADE CLIPPER SURG (BLADE) ×3 IMPLANT
BULB SUCTION JP 400 (SUCTIONS) IMPLANT
CANISTER SUCT 3000ML PPV (MISCELLANEOUS) ×3 IMPLANT
CATH S-M CIRCA TEMP PROBE (CATHETERS) IMPLANT
CLIP TI MEDIUM 24 (CLIP) IMPLANT
CLIP TI WIDE RED SMALL 24 (CLIP) IMPLANT
CNTNR URN SCR LID CUP LEK RST (MISCELLANEOUS) ×3 IMPLANT
CONT SPEC 4OZ STRL OR WHT (MISCELLANEOUS) ×2
DERMABOND ADVANCED .7 DNX12 (GAUZE/BANDAGES/DRESSINGS) IMPLANT
DEVICE ATRICLIP LAA PRCLPII 45 (Clip) IMPLANT
DEVICE COAG EPI SENSE (MISCELLANEOUS) IMPLANT
DISSECTOR BLUNT TIP ENDO 5MM (MISCELLANEOUS) ×9 IMPLANT
DRAIN CHANNEL 10F 3/8 F FF (DRAIN) ×3 IMPLANT
DRAIN CHANNEL 19F RND (DRAIN) IMPLANT
DRAIN CHANNEL 24FR (DRAIN) IMPLANT
DRAPE CV SPLIT W-CLR ANES SCRN (DRAPES) ×3 IMPLANT
DRAPE PERI GROIN 82X75IN TIB (DRAPES) ×3 IMPLANT
ELECT BLADE 4.0 EZ CLEAN MEGAD (MISCELLANEOUS) ×2
ELECT REM PT RETURN 9FT ADLT (ELECTROSURGICAL) ×2
ELECTRODE BLDE 4.0 EZ CLN MEGD (MISCELLANEOUS) IMPLANT
ELECTRODE REM PT RTRN 9FT ADLT (ELECTROSURGICAL) ×3 IMPLANT
GAUZE SPONGE 4X4 12PLY STRL (GAUZE/BANDAGES/DRESSINGS) ×3 IMPLANT
GLOVE BIO SURGEON STRL SZ8 (GLOVE) ×6 IMPLANT
GOWN STRL REUS W/ TWL LRG LVL3 (GOWN DISPOSABLE) ×9 IMPLANT
GOWN STRL REUS W/ TWL XL LVL3 (GOWN DISPOSABLE) ×9 IMPLANT
GOWN STRL REUS W/TWL LRG LVL3 (GOWN DISPOSABLE) ×6
GOWN STRL REUS W/TWL XL LVL3 (GOWN DISPOSABLE) ×6
IV NS 500ML (IV SOLUTION) ×2
IV NS 500ML BAXH (IV SOLUTION) ×3 IMPLANT
IV SET EXTENSION CATH 6 NF (IV SETS) ×3 IMPLANT
KIT BASIN OR (CUSTOM PROCEDURE TRAY) ×3 IMPLANT
KIT TURNOVER KIT B (KITS) ×3 IMPLANT
LIGASURE LAP MARYLAND 5MM 37CM (ELECTROSURGICAL) ×3 IMPLANT
NS IRRIG 1000ML POUR BTL (IV SOLUTION) ×12 IMPLANT
PACK CHEST (CUSTOM PROCEDURE TRAY) ×3 IMPLANT
PAD ARMBOARD 7.5X6 YLW CONV (MISCELLANEOUS) ×6 IMPLANT
PAD ELECT DEFIB RADIOL ZOLL (MISCELLANEOUS) ×3 IMPLANT
PENCIL BUTTON HOLSTER BLD 10FT (ELECTRODE) IMPLANT
SCISSORS LAP 5X35 DISP (ENDOMECHANICALS) IMPLANT
SLEEVE ADV FIXATION 5X100MM (TROCAR) IMPLANT
SOL ANTI FOG 6CC (MISCELLANEOUS) IMPLANT
SUT ETHILON 2 0 PSLX (SUTURE) IMPLANT
SUT MNCRL AB 3-0 PS2 18 (SUTURE) IMPLANT
SUT MNCRL AB 3-0 PS2 27 (SUTURE) ×3 IMPLANT
SUT SILK  1 MH (SUTURE) ×4
SUT SILK 1 MH (SUTURE) ×6 IMPLANT
SUT VIC AB 0 CT1 18XCR BRD 8 (SUTURE) ×3 IMPLANT
SUT VIC AB 0 CT1 8-18 (SUTURE) ×4
SUT VIC AB 1 CTX 27 (SUTURE) IMPLANT
SUT VIC AB 2-0 UR6 27 (SUTURE) IMPLANT
SYR 10ML LL (SYRINGE) IMPLANT
SYSTEM SAHARA CHEST DRAIN ATS (WOUND CARE) IMPLANT
TAPE CLOTH SURG 4X10 WHT LF (GAUZE/BANDAGES/DRESSINGS) IMPLANT
TOWEL GREEN STERILE (TOWEL DISPOSABLE) ×3 IMPLANT
TOWEL GREEN STERILE FF (TOWEL DISPOSABLE) ×3 IMPLANT
TRAY FOLEY SLVR 16FR TEMP STAT (SET/KITS/TRAYS/PACK) ×3 IMPLANT
TROCAR ADV FIXATION 5X100MM (TROCAR) ×6 IMPLANT
TROCAR BALLN 12MMX100 BLUNT (TROCAR) IMPLANT
TROCAR BALLN GELPORT 12X130M (ENDOMECHANICALS) IMPLANT
TUBE CONNECTING 20X1/4 (TUBING) ×6 IMPLANT
TUBING EXTENTION W/L.L. (IV SETS) IMPLANT
TUBING LAP HI FLOW INSUFFLATIO (TUBING) ×3 IMPLANT
WATER STERILE IRR 1000ML POUR (IV SOLUTION) ×6 IMPLANT

## 2022-11-17 NOTE — Hospital Course (Signed)
Primary Care Physician: [name], MD EP MDs:  Dr. Rayann Heman Dr Curt Bears Dr. Quentin Ore  History of Present Illness:       Kayla Hendrix is a 72 y.o. female with a h/o Afib since 1999. She has had 8-9 cardioversions and 2 catheter ablations (2022, 2023). She was admitted in September for tikosyn initiation.   She has been working 6 days a week. Stress on and off. She has been considering going to [facility] to discuss the Convergent procedure. Heard that we were doing Convergent here, meets with [place] today.   In terms of symptoms, she has fatigue. Reports compliance with meds. She reports her rate being higher recently, more 110s and more tired in past several months.   After her first catheter ablation in 2022 she was free from AF for 5-6 months, after another ablation in 2023 she was back in AF within 2-3 weeks.    She recently started on Ozempic. She reports multiple side effects from anti-arrythmic medications in the past.    She has not had a stroke or TIA. Her mother had several strokes, including a severe one in her 83s, due to carotid issues per patient.   We broadly discussed the rationale for the convergent approach.  Standard recovery is walking around day after procedure, discharge to home postop day 2.  I see convergent (epicardial ablation) patients postoperatively back in the clinic in 1 week and 1 month. Patients generally have a full recovery by 3 weeks.     Hospital Course: Ms. Delia was admitted for elective surgery on 11/17/22 and taken to the OR where the Convergent Procedure was performed by way of a subxiphoid port.   A clip was also placed to the left atrial appendage.  After the procedure, she was awakened and extubated and transferred to the PACU in stable condition.

## 2022-11-17 NOTE — Transfer of Care (Signed)
Immediate Anesthesia Transfer of Care Note  Patient: Kayla Hendrix  Procedure(s) Performed: CONVERGENT PROCEDURE CLIPPING OF ATRIAL APPENDAGE (Left: Chest) TRANSESOPHAGEAL ECHOCARDIOGRAM (TEE)  Patient Location: PACU  Anesthesia Type:General  Level of Consciousness: awake, alert , patient cooperative, and responds to stimulation  Airway & Oxygen Therapy: Patient Spontanous Breathing and Patient connected to face mask oxygen  Post-op Assessment: Report given to RN and Post -op Vital signs reviewed and stable  Post vital signs: Reviewed and stable  Last Vitals:  Vitals Value Taken Time  BP 100/57 11/17/22 1245  Temp    Pulse 56 11/17/22 1249  Resp 13 11/17/22 1249  SpO2 99 % 11/17/22 1249  Vitals shown include unvalidated device data.  Last Pain:  Vitals:   11/17/22 8208  TempSrc: Oral  PainSc:          Complications: No notable events documented.

## 2022-11-17 NOTE — Anesthesia Postprocedure Evaluation (Signed)
Anesthesia Post Note  Patient: Tabbetha Kutscher Hackenberg  Procedure(s) Performed: CONVERGENT PROCEDURE CLIPPING OF ATRIAL APPENDAGE (Left: Chest) TRANSESOPHAGEAL ECHOCARDIOGRAM (TEE)     Patient location during evaluation: PACU Anesthesia Type: General Level of consciousness: awake and alert Pain management: pain level controlled Vital Signs Assessment: post-procedure vital signs reviewed and stable Respiratory status: spontaneous breathing, nonlabored ventilation, respiratory function stable and patient connected to nasal cannula oxygen Cardiovascular status: blood pressure returned to baseline and stable Postop Assessment: no apparent nausea or vomiting Anesthetic complications: no  No notable events documented.  Last Vitals:  Vitals:   11/17/22 1545 11/17/22 1600  BP: (!) 93/54 (!) 99/54  Pulse: 61 (!) 57  Resp: 12 15  Temp:    SpO2: 93% 92%    Last Pain:  Vitals:   11/17/22 1545  TempSrc:   PainSc: Tyler Deis

## 2022-11-17 NOTE — H&P (Signed)
[address]       [place] [address]             [phone]                                        Elverna W Mcclurkin [place] Medical Record #[id number] Date of Birth: 04/03/1951   Primary Care Physician: [name], MD EP MDs:  Dr. Rayann Heman Dr Curt Bears Dr. Quentin Ore     Chief Complaint:   Fatigue     History of Present Illness:       MATSUE STROM is a 72 y.o. female with a h/o Afib since 1999. She has had 8-9 cardioversions and 2 catheter ablations (2022, 2023). She was admitted in September for tikosyn initiation.   She has been working 6 days a week. Stress on and off. She has been considering going to [facility] to discuss convergent. Heard that we were doing convergent here, meets with [place] today.   In terms of symptoms, she has fatigue. Reports compliance with meds. She reports her rate being higher recently, more 110s and more tired in past several months.   After her first catheter ablation in 2022 she was free from AF for 5-6 months, after another ablation in 2023 she was back in AF within 2-3 weeks.    She recently started on Ozempic. She reports multiple side effects from anti-arrythmic medications in the past.    She has not had a stroke or TIA. Her mother had several strokes, including a severe one in her 64s, due to carotid issues per patient.        Past Medical and Surgical History: Previous Chest Surgery: No Previous Chest Radiation: No Diabetes Mellitus: No   Creatinine: 1.0       Past Medical History:  Diagnosis Date   Allergy     Anemia     Anxiety     Atrial fibrillation (HCC)      paroxysmal only asa 81 mg, no other blood thinner, the xarelto she was on gave her HAs   Barrett's esophagus 2018   CAD (coronary artery disease)      cath 2006- nonobstructive CAD   Cataract     Depression     Diabetes mellitus without complication (HCC)     Gastric polyp     GERD (gastroesophageal reflux disease)     Glucose intolerance  (impaired glucose tolerance)     Hiatal hernia     History of transesophageal echocardiography (TEE) for monitoring      TEE 3/17:  EF 55%, no RWMA, mild plaque in descending aorta, mild MR, mod BAE, normal RVF   HL (hearing loss)     HTN (hypertension)     Hyperlipidemia     Hyperplastic colon polyp     Internal hemorrhoids     Intestinal metaplasia of gastric mucosa     Obesity     OSA (obstructive sleep apnea)      noncompliant with CPAP   Sleep apnea     Tubular adenoma of colon             Past Surgical History:  Procedure Laterality Date   ATRIAL FIBRILLATION ABLATION N/A 11/15/2020    Procedure: ATRIAL FIBRILLATION ABLATION;  Surgeon: Thompson Grayer, MD;  Location: [place] [facility];  Service: Cardiovascular;  Laterality:  N/A;   ATRIAL FIBRILLATION ABLATION N/A 01/09/2022    Procedure: ATRIAL FIBRILLATION ABLATION;  Surgeon: Constance Haw, MD;  Location: [place] [facility];  Service: Cardiovascular;  Laterality: N/A;   BIOPSY   10/17/2019    Procedure: BIOPSY;  Surgeon: Jerene Bears, MD;  Location: [facility];  Service: Gastroenterology;;   CARDIOVERSION N/A 01/23/2016    Procedure: CARDIOVERSION;  Surgeon: Larey Dresser, MD;  Location: [place];  Service: Cardiovascular;  Laterality: N/A;   CARDIOVERSION N/A 12/07/2019    Procedure: CARDIOVERSION;  Surgeon: Pixie Casino, MD;  Location: [place];  Service: Cardiovascular;  Laterality: N/A;   CARDIOVERSION N/A 08/15/2020    Procedure: CARDIOVERSION;  Surgeon: Werner Lean, MD;  Location: [place];  Service: Cardiovascular;  Laterality: N/A;   CARDIOVERSION N/A 12/27/2020    Procedure: CARDIOVERSION;  Surgeon: Jerline Pain, MD;  Location: [facility] ENDOSCOPY;  Service: Cardiovascular;  Laterality: N/A;   CARDIOVERSION N/A 08/21/2021    Procedure: CARDIOVERSION;  Surgeon: Pixie Casino, MD;  Location: [place];  Service: Cardiovascular;  Laterality: N/A;   CARDIOVERSION N/A 03/03/2022     Procedure: CARDIOVERSION;  Surgeon: [name], MD;  Location: [facility] ENDOSCOPY;  Service: Cardiovascular;  Laterality: N/A;   CESAREAN SECTION       ESOPHAGOGASTRODUODENOSCOPY (EGD) WITH PROPOFOL N/A 10/17/2019    Procedure: ESOPHAGOGASTRODUODENOSCOPY (EGD) WITH PROPOFOL;  Surgeon: Jerene Bears, MD;  Location: [facility]COPY;  Service: Gastroenterology;  Laterality: N/A;   GANGLION CYST EXCISION Left 1970s   HEMOSTASIS CLIP PLACEMENT   10/17/2019    Procedure: HEMOSTASIS CLIP PLACEMENT;  Surgeon: Jerene Bears, MD;  Location: [facility] ENDOSCOPY;  Service: Gastroenterology;;   POLYPECTOMY   10/17/2019    Procedure: POLYPECTOMY;  Surgeon: Jerene Bears, MD;  Location: [facility];  Service: Gastroenterology;;   TEE WITHOUT CARDIOVERSION N/A 01/23/2016    Procedure: TRANSESOPHAGEAL ECHOCARDIOGRAM (TEE);  Surgeon: Larey Dresser, MD;  Location: [place];  Service: Cardiovascular;  Laterality: N/A;   TEE WITHOUT CARDIOVERSION N/A 01/09/2022    Procedure: TRANSESOPHAGEAL ECHOCARDIOGRAM (TEE);  Surgeon: Constance Haw, MD;  Location: [place] [facility];  Service: Cardiovascular;  Laterality: N/A;   TOTAL ABDOMINAL HYSTERECTOMY       WRIST FRACTURE SURGERY   2010    wrist fracture repair with metal rod      Social History:   Social History        Tobacco Use  Smoking Status Former   Types: Cigarettes   Quit date: 2010   Years since quitting: 13.7  Smokeless Tobacco Never  Tobacco Comments    15-pack-year smoker    Social History        Substance and Sexual Activity  Alcohol Use No    Comment: rarely            Allergies  Allergen Reactions   Amiodarone Diarrhea and Nausea Only   Hydrocodone Itching      Medications:           Current Outpatient Medications  Medication Sig Dispense Refill   acetaminophen (TYLENOL) 500 MG tablet Take 1,000-1,500 mg by mouth daily as needed for mild pain or headache.       atorvastatin (LIPITOR) 40 MG tablet Take 40 mg by mouth 2  (two) times a week.       clonazePAM (KLONOPIN) 0.5 MG tablet Take 0.5 mg by mouth 2 (two) times daily as needed for anxiety.       diltiazem (  TIAZAC) 360 MG 24 hr capsule Take 360 mg by mouth daily.       ELIQUIS 5 MG TABS tablet TAKE 1 TABLET BY MOUTH TWICE A DAY 180 tablet 1   Ferrous Sulfate (IRON) 325 (65 Fe) MG TABS Take 325 mg by mouth 2 (two) times a week.       furosemide (LASIX) 20 MG tablet TAKE 1 & 1/2 TABLET BY MOUTH DAILY FOR FLUID 135 tablet 2   gabapentin (NEURONTIN) 300 MG capsule Take 300 mg by mouth 2 (two) times daily.       glucose blood (ACCU-CHEK AVIVA PLUS) test strip CHECK BLOOD SUGAR TWICE DAILY AS DIRECTED       metFORMIN (GLUCOPHAGE-XR) 500 MG 24 hr tablet Take 1,000 mg by mouth at bedtime.       metoprolol tartrate (LOPRESSOR) 50 MG tablet TAKE 1 TABLET BY MOUTH TWICE A DAY 180 tablet 3   OZEMPIC, 2 MG/DOSE, 8 MG/3ML SOPN Inject 2 mg into the skin once a week.        No current facility-administered medications for this visit.      (Not in a hospital admission)          Family History  Problem Relation Age of Onset   Cancer Father     Stomach cancer Father     Diabetes Other          noninsulin dependant DM   Hypertension Other     Stomach cancer Maternal Grandmother     Colon cancer Neg Hx     Colon polyps Neg Hx     Esophageal cancer Neg Hx     Rectal cancer Neg Hx          Review of Systems:    ROS 14 point ROS negative except as per above            Physical Exam: There were no vitals taken for this visit. Physical Exam  Gen NAD Resp nonlaboured CV irreg irreg rate 100s Neuro grossly nonfocal Extr [facility]       Diagnostic Studies & Laboratory data:    Left Heart Catherization:   Cath 2006- CONCLUSION:  1.  Well-preserved overall left ventricular function.  2.  Calcification of mid left anterior descending artery without critical    stenosis.     Echo: TTE 09/24/20   1. Left ventricular ejection fraction, by estimation, is 60  to 65%. The  left ventricle has normal function. The left ventricle has no regional  wall motion abnormalities. There is mild concentric left ventricular  hypertrophy. Left ventricular diastolic  function could not be evaluated.   2. Right ventricular systolic function is normal. The right ventricular  size is normal.   3. Left atrial size was mildly dilated.   4. The mitral valve is normal in structure. Mild mitral valve  regurgitation. No evidence of mitral stenosis.   5. The aortic valve is normal in structure. Aortic valve regurgitation is  not visualized. No aortic stenosis is present.   6. The inferior vena cava is normal in size with greater than 50%  respiratory variability, suggesting right atrial pressure of 3 mmHg.    I have independently reviewed the above radiologic studies and discussed with the patient    Recent Lab Findings: Recent Labs       Lab Results  Component Value Date    WBC 11.0 (H) 02/24/2022    HGB 11.0 (L) 02/24/2022    HCT 35.7 (L) 02/24/2022  PLT 327 02/24/2022    GLUCOSE 97 07/11/2022    CHOL 159 04/10/2012    TRIG 230 (H) 04/10/2012    HDL 34 (L) 04/10/2012    LDLDIRECT 117.4 08/06/2009    LDLCALC 79 04/10/2012    ALT 28 04/08/2012    AST 33 04/08/2012    NA 138 07/11/2022    K 4.3 07/11/2022    CL 102 07/11/2022    CREATININE 1.04 (H) 07/11/2022    BUN 14 07/11/2022    CO2 26 07/11/2022    TSH 1.888 03/14/2020    INR 1.0 04/08/2012    HGBA1C 5.7 04/10/2012            Assessment / Plan:   72 y.o. female with persistent atrial fibrillation with a history of multiple cardioversions and 2 catheter ablations, recently admitted for Tikosyn. Co-morbidities include HTN, HLD, OSA, obesity, GERD. Cardiac hx of nonobstructive CAD and had normal EF and mild MR on echo in the past.   Plan is see back in clinic in Late November Epicardial ablation / LAA clip in December / January.    I told her we are getting started with Convergent here and  she would be on of the initial patients. I offered her procedure to be later if our experience here if that's what she prefers. It seems as of now she wants to get this done. She will return with her son for further discussion.    Typical workup includes: CTA coronary for patients > 65 yo.  Echo in the last year For this patient in particular, additionally obtain carotid duplex (Mother at 51 yo with stoke and carotid surgery)     We broadly discussed the rationale for the convergent approach.  Standard recovery is walking around day after procedure, discharge to home postop day 2.  I see convergent (epicardial ablation) patients postoperatively back in the clinic in 1 week and 1 month. Patients generally have a full recovery by 3 weeks.    Risks/benefits/alternatives discussed at length. All questions were asked an answered. Risks are <<1% mortality, 2% morbidity (bleeding, infection, damage to surrounding structures) and >97% standard recovery.   Typically I do these procedures on Mondays, last dose of Eliquis is Saturday morning. I restart AC the night of surgery. If they receive an LAA clip, most patients, but not all, come off their AC 2-6 months after surgery at the discretion of their referring cardiologist. Many ultimately have reductions in the degree of antiarrythmic medications as well. Expected outcome is significantly more freedom from Afib (measured by 90% reduction of time in AF).     I spent 40 minutes counseling the patient face to face.     [name] Wendall Isabell  Interval updates - s/p cath instead of CTA LAD lesion identified Plan for consideration of treatment in interval after Convergent.

## 2022-11-17 NOTE — Brief Op Note (Signed)
11/17/2022  12:07 PM  PATIENT:  Kayla Hendrix  72 y.o. female  PRE-OPERATIVE DIAGNOSIS:  AFIB  POST-OPERATIVE DIAGNOSIS:  AFIB  PROCEDURES:   --CONVERGENT PROCEDURE   --CLIPPING OF ATRIAL APPENDAGE   --TRANSESOPHAGEAL ECHOCARDIOGRAM   SURGEON:  Enter, [name], MD - Primary  PHYSICIAN ASSISTANT: Lya Holben  ASSISTANTS: Cherene Julian, RN, Scrub Person         Price, Annabell Howells, RN, RN First Assistant   ANESTHESIA:   general  EBL:  less than 72m  BLOOD ADMINISTERED:none  DRAINS: none   LOCAL MEDICATIONS USED:  NONE  SPECIMEN:  No Specimen  DISPOSITION OF SPECIMEN:  N/A  COUNTS:  Correct  DICTATION: .Dragon Dictation  PLAN OF CARE: Admit to inpatient   PATIENT DISPOSITION:  PACU - hemodynamically stable.   Delay start of Pharmacological VTE agent (>24hrs) due to surgical blood loss or risk of bleeding: yes

## 2022-11-17 NOTE — TOC Progression Note (Signed)
Transition of Care [facility]) - Progression Note    Patient Details  Name: Kayla Hendrix MRN: [id number] Date of Birth: August 26, 1951  Transition of Care [facility]) CM/SW Contact  Zenon Mayo, RN Phone Number: 11/17/2022, 8:55 PM  Clinical Narrative:    From home, s/p left apical appendage, conts with chest tube , morphine pca.  TOC following.         Expected Discharge Plan and Services                                               Social Determinants of Health (SDOH) Interventions SDOH Screenings   Tobacco Use: Medium Risk (11/17/2022)    Readmission Risk Interventions     No data to display

## 2022-11-17 NOTE — Anesthesia Procedure Notes (Signed)
Arterial Line Insertion Start/End1/06/2023 7:10 AM, 11/17/2022 7:15 AM Performed by: Suzette Battiest, MD, Shrihaan Porzio Melanee Left, CRNA, CRNA  Patient location: Pre-op. Preanesthetic checklist: patient identified, IV checked, site marked, risks and benefits discussed, surgical consent, monitors and equipment checked, pre-op evaluation, timeout performed and anesthesia consent Lidocaine 1% used for infiltration Left, radial was placed Catheter size: 20 G Hand hygiene performed  and maximum sterile barriers used  Allen's test indicative of satisfactory collateral circulation Attempts: 1 Procedure performed without using ultrasound guided technique. Following insertion, dressing applied and Biopatch. Post procedure assessment: normal  Patient tolerated the procedure well with no immediate complications.

## 2022-11-17 NOTE — Anesthesia Procedure Notes (Signed)
Procedure Name: Intubation Date/Time: 11/17/2022 8:01 AM  Performed by: Glynda Jaeger, CRNAPre-anesthesia Checklist: Patient identified, Patient being monitored, Timeout performed, Emergency Drugs available and Suction available Patient Re-evaluated:Patient Re-evaluated prior to induction Oxygen Delivery Method: Circle System Utilized Preoxygenation: Pre-oxygenation with 100% oxygen Induction Type: IV induction Ventilation: Mask ventilation without difficulty Laryngoscope Size: Mac and 4 Grade View: Grade I Tube type: Oral Endobronchial tube: Double lumen EBT and 37 Fr Number of attempts: 1 Airway Equipment and Method: Stylet Placement Confirmation: ETT inserted through vocal cords under direct vision, positive ETCO2 and breath sounds checked- equal and bilateral Tube secured with: Tape Dental Injury: Teeth and Oropharynx as per pre-operative assessment

## 2022-11-17 NOTE — Progress Notes (Signed)
Convergent procedure performed today 2 prior catheter ablations  Morphine PCA tonight  Will likely DC left chest tube tomorrow AM Will DC mediastinal drain day of discharge (likely Wed)  IV steroids x 3 doses Then 40 po daily Followed by medrol dose pack Colchicine for another 2 weeks  Wouldn't be surprised If back and for with AF for about 3 months.

## 2022-11-18 ENCOUNTER — Inpatient Hospital Stay (HOSPITAL_COMMUNITY): Payer: BC Managed Care – PPO

## 2022-11-18 LAB — CBC
HCT: 34.6 % — ABNORMAL LOW (ref 36.0–46.0)
Hemoglobin: 10.5 g/dL — ABNORMAL LOW (ref 12.0–15.0)
MCH: 21.5 pg — ABNORMAL LOW (ref 26.0–34.0)
MCHC: 30.3 g/dL (ref 30.0–36.0)
MCV: 70.9 fL — ABNORMAL LOW (ref 80.0–100.0)
Platelets: 227 10*3/uL (ref 150–400)
RBC: 4.88 MIL/uL (ref 3.87–5.11)
RDW: 15.6 % — ABNORMAL HIGH (ref 11.5–15.5)
WBC: 12.3 10*3/uL — ABNORMAL HIGH (ref 4.0–10.5)
nRBC: 0 % (ref 0.0–0.2)

## 2022-11-18 LAB — BASIC METABOLIC PANEL
Anion gap: 10 (ref 5–15)
BUN: 20 mg/dL (ref 8–23)
CO2: 25 mmol/L (ref 22–32)
Calcium: 7.7 mg/dL — ABNORMAL LOW (ref 8.9–10.3)
Chloride: 102 mmol/L (ref 98–111)
Creatinine, Ser: 1.24 mg/dL — ABNORMAL HIGH (ref 0.44–1.00)
GFR, Estimated: 47 mL/min — ABNORMAL LOW (ref >60)
Glucose, Bld: 162 mg/dL — ABNORMAL HIGH (ref 70–99)
Potassium: 4.6 mmol/L (ref 3.5–5.1)
Sodium: 137 mmol/L (ref 135–145)

## 2022-11-18 LAB — MAGNESIUM: Magnesium: 1.7 mg/dL (ref 1.7–2.4)

## 2022-11-18 MED ORDER — PREDNISONE 20 MG PO TABS
40.0000 mg | ORAL_TABLET | Freq: Every day | ORAL | Status: DC
  Administered 2022-11-18 – 2022-11-20 (×3): 40 mg via ORAL
  Filled 2022-11-18 (×3): qty 2

## 2022-11-18 MED ORDER — APIXABAN 5 MG PO TABS
5.0000 mg | ORAL_TABLET | Freq: Two times a day (BID) | ORAL | Status: DC
  Administered 2022-11-18 – 2022-11-20 (×4): 5 mg via ORAL
  Filled 2022-11-18 (×4): qty 1

## 2022-11-18 NOTE — Progress Notes (Signed)
      [address]       [place] [address]             [phone]      1 Day Post-Op Procedure(s) (LRB): CONVERGENT PROCEDURE (N/A) CLIPPING OF ATRIAL APPENDAGE (Left) TRANSESOPHAGEAL ECHOCARDIOGRAM (TEE) (N/A) Subjective: Resting in bed.  Says she has some pain in her left shoulder and at the left chest tube insertion site.  She has a PCA.  Objective: Vital signs in last 24 hours: Temp:  [97.6 F (36.4 C)-98.2 F (36.8 C)] 98.1 F (36.7 C) (01/09 0805) Pulse Rate:  [49-90] 90 (01/09 0805) Cardiac Rhythm: Normal sinus rhythm (01/09 0741) Resp:  [10-20] 16 (01/09 0805) BP: (93-121)/(54-63) 119/63 (01/09 0805) SpO2:  [89 %-98 %] 95 % (01/09 0805) Arterial Line BP: (102-141)/(39-58) 120/44 (01/09 0400) Weight:  [102.4 kg] 102.4 kg (01/09 0302)      Intake/Output from previous day: 01/08 0701 - 01/09 0700 In: 3154.9 [P.O.:200; I.V.:2200; IV Piggyback:660.9] Out: 700 [Urine:515; Drains:60; Chest Tube:125] Intake/Output this shift: No intake/output data recorded.  General appearance: alert, cooperative, and mild distress Neurologic: intact Heart: Currently in sinus rhythm Lungs: Breath sounds clear, shallow.  Chest x-ray shows clear lung fields.  Minimal drainage from the left pleural tube. Abdomen: Soft, nontender subxiphoid incision is dry.  Minimal drainage from the mediastinal tube Wound: Incisions are dry  Lab Results: Recent Labs    11/17/22 1340 11/18/22 0046  WBC 14.0* 12.3*  HGB 10.1* 10.5*  HCT 33.5* 34.6*  PLT 260 227   BMET:  Recent Labs    11/17/22 2026 11/18/22 0046  NA 138 137  K 4.7 4.6  CL 102 102  CO2 25 25  GLUCOSE 161* 162*  BUN 20 20  CREATININE 1.17* 1.24*  CALCIUM 7.7* 7.7*    PT/INR:  Recent Labs    11/17/22 1340  LABPROT 15.3*  INR 1.2   ABG    Component Value Date/Time   PHART 7.3 (L) 11/17/2022 1340   HCO3 27.9 11/17/2022 1340   TCO2 29 11/17/2022 0814   O2SAT 95.1 11/17/2022 1340   CBG (last  3)  Recent Labs    11/17/22 0636 11/17/22 1247  GLUCAP 138* 180*    Assessment/Plan: S/P Procedure(s) (LRB): CONVERGENT PROCEDURE (N/A) CLIPPING OF ATRIAL APPENDAGE (Left) TRANSESOPHAGEAL ECHOCARDIOGRAM (TEE) (N/A)  -Postop day 1 convergent procedure for persistent atrial fibrillation.  She is currently in sinus rhythm.  She has completed 3 doses of Solu-Cortef.  Will transition to prednisone 40 mg p.o. daily and continue with colchicine.  Will plan to remove the left pleural tube but leave the mediastinal drain in place today.  Mobilize as able.  Advance diet as tolerated   LOS: 1 day    Antony Odea, PA-C [phone] 11/18/2022

## 2022-11-18 NOTE — Progress Notes (Signed)
Received Cardiac Rehab Phase 1 order however pt does not have a diagnosis that meets CR protocol therefore will not follow for ambulation. Mobility team could be consulted.  Yves Dill BS, ACSM-CEP 11/18/2022 9:00 AM

## 2022-11-18 NOTE — Progress Notes (Signed)
The patient should start eliquis today as ordered.

## 2022-11-18 NOTE — Progress Notes (Signed)
Wasted 21m morphine from PCA pump with CCurator

## 2022-11-18 NOTE — Discharge Instructions (Signed)

## 2022-11-18 NOTE — Discharge Summary (Signed)
Physician Discharge Summary  Patient ID: Kayla Hendrix MRN: [id number] DOB/AGE: 18-Mar-1951 72 y.o.  Admit date: 11/17/2022 Discharge date: 11/18/2022  Admission Diagnoses:  ATRIAL FIBRILLATION Status post convergent procedure Dyslipidemia Tobacco abuse History of hypertension History of gastroesophageal reflux disease Obesity  Discharge Diagnoses:   ATRIAL FIBRILLATION Status post convergent procedure Status post left video-assisted thoracoscopy with placement of left atrial appendage clip Dyslipidemia Tobacco abuse History of hypertension History of gastroesophageal reflux disease Obesity  Discharged Condition: {condition:18240}  Primary Care Physician: [name], MD EP MDs:  Dr. Rayann Heman Dr Curt Bears Dr. Quentin Ore  History of Present Illness:       Kayla Hendrix is a 72 y.o. female with a h/o Afib since 1999. She has had 8-9 cardioversions and 2 catheter ablations (2022, 2023). She was admitted in September for tikosyn initiation.   She has been working 6 days a week. Stress on and off. She has been considering going to [facility] to discuss the Convergent procedure. Heard that we were doing Convergent here, meets with [place] today.   In terms of symptoms, she has fatigue. Reports compliance with meds. She reports her rate being higher recently, more 110s and more tired in past several months.   After her first catheter ablation in 2022 she was free from AF for 5-6 months, after another ablation in 2023 she was back in AF within 2-3 weeks.    She recently started on Ozempic. She reports multiple side effects from anti-arrythmic medications in the past.    She has not had a stroke or TIA. Her mother had several strokes, including a severe one in her 81s, due to carotid issues per patient.   We broadly discussed the rationale for the convergent approach.  Standard recovery is walking around day after procedure, discharge to home postop day 2.  I see convergent (epicardial  ablation) patients postoperatively back in the clinic in 1 week and 1 month. Patients generally have a full recovery by 3 weeks.     Hospital Course: [name] was admitted for elective surgery on 11/17/22 and taken to the OR where the Convergent Procedure was performed by way of a subxiphoid port.   A clip was also placed to the left atrial appendage.  After the procedure, she was awakened and extubated and transferred to the PACU in stable condition.  Vital signs remained stable.  On the morning of the first postoperative day, she was found to be in sinus rhythm.  She had minimal drainage from the pleural tube as well as the mediastinal drain.  Left pleural tube was removed.  Chest x-ray was satisfactory.  She was mobilized.  Diet was initiated with clear liquids and advance as tolerated.  Consults: None  Significant Diagnostic Studies: {diagnostics:18242}  Treatments: {Tx:18249}  Discharge Exam: Blood pressure 120/62, pulse 90, temperature 98.2 F (36.8 C), temperature source Oral, resp. rate 16, height '5\' 6"'$  (1.676 m), weight 102.4 kg, SpO2 94 %. {physical HYIF:0277412}  Disposition:  There are no questions and answers to display.        Discharge Instructions     AMB Referral to Cardiac Rehabilitation - Phase II   Complete by: As directed    Diagnosis: Other   After initial evaluation and assessments completed: Virtual Based Care may be provided alone or in conjunction with Phase 2 Cardiac Rehab based on patient barriers.: Yes   Intensive Cardiac Rehabilitation (ICR) [facility] location only OR Traditional Cardiac Rehabilitation (TCR) *If criteria for ICR are not  met will enroll in TCR [facility] only): Yes      Allergies as of 11/18/2022       Reactions   Amiodarone Diarrhea, Nausea Only   Hydrocodone Itching     Med Rec must be completed prior to using this [facility]***       Follow-up Information     Enter, [name], MD. Go on 11/27/2022.   Specialties: Cardiothoracic  Surgery, Cardiology Why: Your appointment is at 1 PM. Contact information: [address] [place] [address] [phone]         Constance Haw, MD. Go on 12/22/2022.   Specialty: Cardiology Why: Your appointment is at 11:15 AM Contact information: [address] [place] [address] [phone]                     Signed: Antony Odea, PA-C 11/18/2022, 2:04 PM

## 2022-11-19 ENCOUNTER — Inpatient Hospital Stay (HOSPITAL_COMMUNITY): Payer: BC Managed Care – PPO

## 2022-11-19 ENCOUNTER — Encounter (HOSPITAL_COMMUNITY): Payer: Self-pay | Admitting: Cardiothoracic Surgery

## 2022-11-19 MED ORDER — METOPROLOL TARTRATE 25 MG PO TABS
25.0000 mg | ORAL_TABLET | Freq: Two times a day (BID) | ORAL | Status: DC
  Administered 2022-11-19 – 2022-11-20 (×2): 25 mg via ORAL
  Filled 2022-11-19 (×2): qty 1

## 2022-11-19 MED ORDER — FUROSEMIDE 10 MG/ML IJ SOLN
20.0000 mg | Freq: Every day | INTRAMUSCULAR | Status: DC
  Administered 2022-11-19 – 2022-11-20 (×2): 20 mg via INTRAVENOUS
  Filled 2022-11-19 (×2): qty 2

## 2022-11-19 MED ORDER — CLONAZEPAM 0.5 MG PO TABS: 0.5000 mg | ORAL_TABLET | Freq: Every day | ORAL | Status: DC | PRN

## 2022-11-19 NOTE — Progress Notes (Signed)
Mobility Specialist Progress Note    11/19/22 1056  Mobility  Activity Ambulated with assistance in hallway  Level of Assistance Minimal assist, patient does 75% or more  Assistive Device Front wheel walker  Distance Ambulated (ft) 200 ft  Activity Response Tolerated well  Mobility Referral Yes  $Mobility charge 1 Mobility   Pre-Mobility: 85 HR, 94% SpO2 During Mobility: 95 HR, 93% SpO2 Post-Mobility: 87 HR, 94% SpO2  Pt received in bed and agreeable. Had void in BR. No complaints on walk. On 4LO2. Returned to chair with call bell in reach.    Hildred Alamin Mobility Specialist  Please Psychologist, sport and exercise or Rehab Office at [phone]

## 2022-11-19 NOTE — Progress Notes (Addendum)
[address]       [place] [address]             [phone]      2 Days Post-Op Procedure(s) (LRB): CONVERGENT PROCEDURE (N/A) CLIPPING OF ATRIAL APPENDAGE (Left) TRANSESOPHAGEAL ECHOCARDIOGRAM (TEE) (N/A) Subjective:  Resting in bed.  Had some blood-tinged sputum last night.  Did not get out of bed yesterday.  Says appetite is poor.  No flatus or BM yet.  PCA has been discontinued.   Objective: Vital signs in last 24 hours: Temp:  [98 F (36.7 C)-98.4 F (36.9 C)] 98.3 F (36.8 C) (01/10 0400) Pulse Rate:  [75-92] 75 (01/10 0400) Cardiac Rhythm: Normal sinus rhythm (01/09 1903) Resp:  [11-19] 14 (01/10 0400) BP: (109-143)/(57-75) 130/68 (01/10 0400) SpO2:  [93 %-95 %] 94 % (01/10 0400) Weight:  [98.4 kg] 98.4 kg (01/10 0430)  Intake/Output from previous day: 01/09 0701 - 01/10 0700 In: 578.1 [P.O.:360; I.V.:13.5; IV Piggyback:204.6] Out: 2190 [Urine:2100; Drains:40; Chest Tube:50] Intake/Output this shift: No intake/output data recorded.  General appearance: alert, cooperative, and mild distress Neurologic: intact Heart: Currently in sinus rhythm with a few PVC's Lungs: Breath sounds clear, shallow.  Chest x-ray shows clear lung fields.  The mediastinal drain had 53m serous fluid for past 24 hours.  Abdomen: Soft, non-tender, subxiphoid incision is dry.  Wound: Incisions are dry  Lab Results: Recent Labs    11/17/22 1340 11/18/22 0046  WBC 14.0* 12.3*  HGB 10.1* 10.5*  HCT 33.5* 34.6*  PLT 260 227    BMET:  Recent Labs    11/17/22 2026 11/18/22 0046  NA 138 137  K 4.7 4.6  CL 102 102  CO2 25 25  GLUCOSE 161* 162*  BUN 20 20  CREATININE 1.17* 1.24*  CALCIUM 7.7* 7.7*     PT/INR:  Recent Labs    11/17/22 1340  LABPROT 15.3*  INR 1.2    ABG    Component Value Date/Time   PHART 7.3 (L) 11/17/2022 1340   HCO3 27.9 11/17/2022 1340   TCO2 29 11/17/2022 0814   O2SAT 95.1 11/17/2022 1340   CBG (last 3)  Recent  Labs    11/17/22 0636 11/17/22 1247  GLUCAP 138* 180*    CLINICAL DATA:  [id number] postoperative check.   EXAM: PORTABLE CHEST 1 VIEW   COMPARISON:  Portable chest yesterday at 3:37 p.m.   FINDINGS: 5:14 a.m.: There is overlying monitor wiring. 2 adjacent left atrial appendage clips are again shown.   The heart is moderately enlarged. No vascular congestion is seen. There is no visible pneumothorax.   There is hazy atelectasis or consolidation at the left base with remaining lungs clear.   The mediastinal configuration is normal. Aortic atherosclerosis. No acute osseous abnormality.   IMPRESSION: Cardiomegaly without vascular congestion.   Hazy atelectasis or consolidation at the left base.   No other acute radiographic chest findings. No visible pneumothorax.     Electronically Signed   By: KTelford NabM.D.   On: 11/19/2022 06:41   Assessment/Plan: S/P Procedure(s) (LRB): CONVERGENT PROCEDURE (N/A) CLIPPING OF ATRIAL APPENDAGE (Left) TRANSESOPHAGEAL ECHOCARDIOGRAM (TEE) (N/A)  -Postop day 2 convergent procedure for persistent atrial fibrillation.  She is currently in sinus rhythm.  She has completed 3 doses of Solu-Cortef on POD1 and  is now on prednisone 40 mg p.o. and  colchicine.  Restarting the Eliquis today.  The  left pleural tube is out and  the mediastinal drain has  low output.  PCA discontinued. Has not been out of bed yet. Will ask mobility team to assist with ambulation.  Advance diet as tolerated   LOS: 2 days    Malon Kindle [phone] 11/19/2022  Plan is home tomorrow Will be on Medrol dose pack 2 weeks colchicine Eliquis  May go in and out of AF for period of time. Eval success after 3 months s/p procedure.   Justice Rocher MD CV Surgery

## 2022-11-19 NOTE — Progress Notes (Signed)
Mobility Specialist Progress Note    11/19/22 1528  Mobility  Activity Ambulated with assistance in hallway  Level of Assistance Contact guard assist, steadying assist  Assistive Device Front wheel walker  Distance Ambulated (ft) 360 ft  Activity Response Tolerated well  Mobility Referral Yes  $Mobility charge 1 Mobility   Pre-Mobility: 90 HR, 94% SpO2 During Mobility: 100 HR, 95% SpO2 Post-Mobility: 96 HR, 91% SpO2  Pt received in bed and agreeable. No complaints. Tolerated on 3LO2. RN notified. Returned to BR to attempt BM. Encouraged to pull string when ready for assistance. NT aware.   Hildred Alamin Mobility Specialist  Please Psychologist, sport and exercise or Rehab Office at [phone]

## 2022-11-20 ENCOUNTER — Other Ambulatory Visit (HOSPITAL_COMMUNITY): Payer: Self-pay

## 2022-11-20 MED ORDER — METHYLPREDNISOLONE 4 MG PO TBPK
ORAL_TABLET | ORAL | 0 refills | Status: DC
  Filled 2022-11-20: qty 21, 6d supply, fill #0

## 2022-11-20 MED ORDER — TRAMADOL HCL 50 MG PO TABS
50.0000 mg | ORAL_TABLET | Freq: Four times a day (QID) | ORAL | 0 refills | Status: AC | PRN
  Filled 2022-11-20: qty 28, 7d supply, fill #0

## 2022-11-20 MED ORDER — ACETAMINOPHEN 325 MG PO TABS: 650.0000 mg | ORAL_TABLET | ORAL | Status: DC | PRN

## 2022-11-20 MED ORDER — COLCHICINE 0.6 MG PO TABS
0.6000 mg | ORAL_TABLET | Freq: Every day | ORAL | 0 refills | Status: DC
  Filled 2022-11-20: qty 14, 14d supply, fill #0

## 2022-11-20 NOTE — Progress Notes (Signed)
Nurse requested Mobility Specialist to perform oxygen saturation test with pt which includes removing pt from oxygen both at rest and while ambulating.  Below are the results from that testing.     Patient Saturations on Room Air at Rest = spO2 89%  Patient Saturations on Room Air while Ambulating = sp02 88% .  Rested and performed pursed lip breathing for 1 minute with sp02 at 91%.  Patient Saturations on 2 Liters of oxygen while Ambulating = sp02 93%  At end of testing pt left in room on RA.    Reported results to nurse.

## 2022-11-20 NOTE — Progress Notes (Signed)
Mobility Specialist Progress Note    11/20/22 0857  Mobility  Activity Ambulated with assistance in hallway  Level of Assistance Standby assist, set-up cues, supervision of patient - no hands on  Assistive Device Front wheel walker  Distance Ambulated (ft) 350 ft  Activity Response Tolerated well  Mobility Referral Yes  $Mobility charge 1 Mobility   Pre-Mobility: 78 HR, 89% SpO2 During Mobility: 90 HR Post-Mobility: 91 HR, 90% SpO2  Pt received in bed and agreeable. No complaints. Encouraged pursed lip breathing. Returned to [facility] and encouraged to pull string when ready to get up.   Hildred Alamin Mobility Specialist  Please Psychologist, sport and exercise or Rehab Office at [phone]

## 2022-11-20 NOTE — Progress Notes (Signed)
Medistinal JP drain removed by Enid Cutter, PA. Site covered with gauze and tape.

## 2022-11-20 NOTE — Progress Notes (Signed)
Patient provided with verbal discharge instructions. Paper copy of discharge provided to patient. RN answered all questions. VSS at discharge. IV removed. Patient belongings sent with patient. TOC medications brought to beside. Home O2 brought to beside. JP drain removed by Enid Cutter, PA.  Patient dc'd via wheelchair to private vehicle.

## 2022-11-20 NOTE — Plan of Care (Signed)
  Problem: Education: Goal: Understanding of CV disease, CV risk reduction, and recovery process will improve Outcome: Adequate for Discharge Goal: Individualized Educational Video(s) Outcome: Adequate for Discharge   Problem: Activity: Goal: Ability to return to baseline activity level will improve Outcome: Adequate for Discharge   Problem: Cardiovascular: Goal: Ability to achieve and maintain adequate cardiovascular perfusion will improve Outcome: Adequate for Discharge Goal: Vascular access site(s) Level 0-1 will be maintained Outcome: Adequate for Discharge   Problem: Health Behavior/Discharge Planning: Goal: Ability to safely manage health-related needs after discharge will improve Outcome: Adequate for Discharge   Problem: Education: Goal: Knowledge of General Education information will improve Description: Including pain rating scale, medication(s)/side effects and non-pharmacologic comfort measures Outcome: Adequate for Discharge   Problem: Health Behavior/Discharge Planning: Goal: Ability to manage health-related needs will improve Outcome: Adequate for Discharge   Problem: Clinical Measurements: Goal: Ability to maintain clinical measurements within normal limits will improve Outcome: Adequate for Discharge Goal: Will remain free from infection Outcome: Adequate for Discharge Goal: Diagnostic test results will improve Outcome: Adequate for Discharge Goal: Respiratory complications will improve Outcome: Adequate for Discharge Goal: Cardiovascular complication will be avoided Outcome: Adequate for Discharge   Problem: Activity: Goal: Risk for activity intolerance will decrease Outcome: Adequate for Discharge   Problem: Nutrition: Goal: Adequate nutrition will be maintained Outcome: Adequate for Discharge   Problem: Coping: Goal: Level of anxiety will decrease Outcome: Adequate for Discharge   Problem: Elimination: Goal: Will not experience complications  related to bowel motility Outcome: Adequate for Discharge Goal: Will not experience complications related to urinary retention Outcome: Adequate for Discharge   Problem: Pain Managment: Goal: General experience of comfort will improve Outcome: Adequate for Discharge   Problem: Safety: Goal: Ability to remain free from injury will improve Outcome: Adequate for Discharge   Problem: Skin Integrity: Goal: Risk for impaired skin integrity will decrease Outcome: Adequate for Discharge   Problem: Education: Goal: Will demonstrate proper wound care and an understanding of methods to prevent future damage Outcome: Adequate for Discharge Goal: Knowledge of disease or condition will improve Outcome: Adequate for Discharge Goal: Knowledge of the prescribed therapeutic regimen will improve Outcome: Adequate for Discharge Goal: Individualized Educational Video(s) Outcome: Adequate for Discharge   Problem: Activity: Goal: Risk for activity intolerance will decrease Outcome: Adequate for Discharge   Problem: Cardiac: Goal: Will achieve and/or maintain hemodynamic stability Outcome: Adequate for Discharge   Problem: Clinical Measurements: Goal: Postoperative complications will be avoided or minimized Outcome: Adequate for Discharge   Problem: Respiratory: Goal: Respiratory status will improve Outcome: Adequate for Discharge   Problem: Skin Integrity: Goal: Wound healing without signs and symptoms of infection Outcome: Adequate for Discharge Goal: Risk for impaired skin integrity will decrease Outcome: Adequate for Discharge   Problem: Urinary Elimination: Goal: Ability to achieve and maintain adequate renal perfusion and functioning will improve Outcome: Adequate for Discharge

## 2022-11-20 NOTE — TOC Transition Note (Addendum)
Transition of Care [facility]) - CM/SW Discharge Note   Patient Details  Name: Kayla Hendrix MRN: [id number] Date of Birth: [birth year]  [facility] CM/SW Contact:  Zenon Mayo, RN Phone Number: 11/20/2022, 12:04 PM   Clinical Narrative:    Patient is for dc today, she will need home oxygen, NCM offered choice, she states she does not have a preference.  NCM made referral to [facility] with [employer].  Rep will deliver oxygen tank to the room and set up the concentrator at patient's home. Patient states son will transport her  home today.   Final next level of care: Home/Self Care Barriers to Discharge: No Barriers Identified   Patient Goals and CMS Choice CMS Medicare.gov Compare Post Acute Care list provided to:: Patient Choice offered to / list presented to : Patient  Discharge Placement                         Discharge Plan and Services Additional resources added to the After Visit Summary for                  DME Arranged: Oxygen DME Agency: [employer] Date DME Agency Contacted: 11/20/22 Time DME Agency Contacted: 1204 Representative spoke with at DME Agency: Brenton Grills HH Arranged: NA          Social Determinants of Health ([place]) Interventions SDOH Screenings   Food Insecurity: No Food Insecurity (11/18/2022)  Housing: Low Risk  (11/18/2022)  Transportation Needs: No Transportation Needs (11/18/2022)  Utilities: Not At Risk (11/18/2022)  Tobacco Use: Medium Risk (11/19/2022)     Readmission Risk Interventions     No data to display

## 2022-11-20 NOTE — Progress Notes (Signed)
      [address]       [place] [address]             [phone]      3 Days Post-Op Procedure(s) (LRB): CONVERGENT PROCEDURE (N/A) CLIPPING OF ATRIAL APPENDAGE (Left) TRANSESOPHAGEAL ECHOCARDIOGRAM (TEE) (N/A) Subjective:  Resting in bed.  Had a better day yesterday. Walked in the hall twice.    Objective: Vital signs in last 24 hours: Temp:  [97.8 F (36.6 C)-98.4 F (36.9 C)] 98.3 F (36.8 C) (01/11 0416) Pulse Rate:  [75-89] 75 (01/11 0416) Cardiac Rhythm: Normal sinus rhythm (01/11 0416) Resp:  [17-20] 18 (01/11 0416) BP: (128-151)/(62-76) 136/68 (01/11 0416) SpO2:  [91 %-97 %] 94 % (01/11 0416)  Intake/Output from previous day: 01/10 0701 - 01/11 0700 In: -  Out: 1560 [Urine:1550; Drains:10] Intake/Output this shift: No intake/output data recorded.  General appearance: alert, cooperative, and mild distress Neurologic: intact Heart: remains in sinus rhythm with a few PVC's Lungs: Breath sounds clear, shallow.  The mediastinal drain had 38m serous fluid for past 24 hours.  Abdomen: Soft, non-tender, subxiphoid incision is dry.  Wound: Incisions are dry  Lab Results: Recent Labs    11/17/22 1340 11/18/22 0046  WBC 14.0* 12.3*  HGB 10.1* 10.5*  HCT 33.5* 34.6*  PLT 260 227    BMET:  Recent Labs    11/17/22 2026 11/18/22 0046  NA 138 137  K 4.7 4.6  CL 102 102  CO2 25 25  GLUCOSE 161* 162*  BUN 20 20  CREATININE 1.17* 1.24*  CALCIUM 7.7* 7.7*     PT/INR:  Recent Labs    11/17/22 1340  LABPROT 15.3*  INR 1.2    ABG    Component Value Date/Time   PHART 7.3 (L) 11/17/2022 1340   HCO3 27.9 11/17/2022 1340   TCO2 29 11/17/2022 0814   O2SAT 95.1 11/17/2022 1340   CBG (last 3)     Assessment/Plan: S/P Procedure(s) (LRB): CONVERGENT PROCEDURE (N/A) CLIPPING OF ATRIAL APPENDAGE (Left) TRANSESOPHAGEAL ECHOCARDIOGRAM (TEE) (N/A)  -Postop day  convergent procedure for persistent atrial fibrillation.  She is  currently maintaining sinus rhythm.  She has completed 3 doses of Solu-Cortef on POD1 and  is now on prednisone 40 mg p.o. and  colchicine.  Eliquis has been resumed.  Will d/c the mediastinal drain. Ambulate this morning off O2 and if sats stable, will plan to discharge later this morning.  Her son will be assisting her at home.   Discharging on: Medrol dose pack 2 weeks colchicine Eliquis Metoprolol   LOS: 3 days    MAntony Odea PA-C [phone]/09/2023

## 2022-11-23 NOTE — Progress Notes (Signed)
Kayla Hendrix       [place] [address]             [place] Record #[id number] Date of Birth: 1951/06/03  Referring: [name], MD Primary Care: [name], MD Primary Cardiologist: None  S/P Convergent epicardial ablation and LAA Clip on 11/17/22  Doing well at home. Happy we moved forward with surgery  Hasn't pushed herself yet to do more than before surgery.   PHYSICAL EXAMINATION: Vitals:   11/27/22 1309  BP: 137/80  Pulse: 79  Resp: 20  SpO2: 97%     Gen: NAD Neuro: Alert and oriented Chest: incisions healing well, 27m erythema around pericardial drain site.  Resp: Nonlaboured Abd: Soft, ntnd Extr: WWP, 1+ LE edema bilateral  Diagnostic Studies & Laboratory data:    XR today: Not done they are delayed  Assessment / Plan:    S/P Convergent epicardial ablation and LAA Clip on 11/17/22  Incisions healing well Is on eliquis Is on colchicine for 1 more week. Took medrol dose pack.   5 d keflex 500 TID  See back in 1 week.  Suture removal at that time.   Overall, I would probably keep this patient on eliquis long-term. Discussed with her as well.    DPierre BaliEnter 11/23/2022 4:03 PM

## 2022-11-23 NOTE — Op Note (Addendum)
OPERATIVE NOTE: Patient Name: Kayla Hendrix Date of Birth: 12/07/1950 Date of Operation: 11/17/22  PRE-OPERATIVE DIAGNOSIS: Atrial Fibrillation, persistent Coronary artery disease, mid-LAD lesion  POST-OPERATIVE DIAGNOSIS: Same  OPERATION: Convergent epicardial ablation, extensive Clipping of left atrial appendage  SURGEON: [name] Evalena Fujii MD   ASSISTANT: Enid Cutter, PA  EBL 10cc  FINDINGS: Large area on back wall of left atrium ablated.   Approximately 28 epicardial ablations performed  Left atrial appendage - required 2 clips. Unusual anatomy with large fat pad abutting AV groove.  Made base of LAA less accessible than usual. Applied 2nd clip to base of LAA.  Typical resulted is <26m residual LAA. Small area of 722mresidual LAA in this patient.     SPECIMENS None   COMPLICATIONS: None  TUBES:  1 JP to Bulb (mediastinal) 1 24 Fr chest tube (left pleural)   INDICATIONS: [age].o. female with persistent atrial fibrillation with a history of multiple cardioversions and 2 catheter ablations, recently admitted for Tikosyn. Co-morbidities include HTN, HLD, OSA, obesity, GERD. Cardiac hx of nonobstructive CAD and had normal EF and mild MR on echo in the past.    We broadly discussed the rationale for the convergent approach.  Standard recovery is walking around day after procedure, discharge to home postop day 2.  I see convergent (epicardial ablation) patients postoperatively back in the clinic in 1 week and 1 month. Patients generally have a full recovery by 3 weeks.    Risks/benefits/alternatives discussed at length. All questions were asked an answered. Risks are <<1% mortality, 2% morbidity (bleeding, infection, damage to surrounding structures) and >97% standard recovery.    Many ultimately have reductions in the degree of antiarrythmic medications after the convergent approach. Expected outcome is significantly more freedom from Afib (measured by 90%  reduction of time in AF).  Of note, in the preoperative area the patient informed both me and the anesthesiologist she would not accept blood products under any condition.   We did identify a significant mid-LAD lesion in her workup that will be of import later. Discussed with cardiology and stenting best after convergent completed.     PROCEDURE IN DETAIL: The patient was brought in the operating room and laid in supine position. A TEE probe was placed and it was confirmed there was not clot in the left atrial appendage. Following this, a twelve point temperature probe was placed and fluoroscopically found to be in adequate position. There were no red-level temperature warnings during the case.    The patient was prepped and draped in standard fashion.   An arterial and venous line (EJ) were placed by anesthesia along with a double-lumen endotracheal tube. An inflatable bump placed under left side and patient was prepped with bump in place. Bump deflated. After timeout, incision made over xiphoid approximately 7cm. Local analgesia given. This was dissected down to xiphoid with electrocautery and xiphoid removed. Pericardiotomy performed. Atricure cannula inserted along with 27m41mero degree scope. Approximately 28 epicardial ablations were performed along the back wall of the left atrium. There were done in 3 rows and with a fairly high pericardial reflection we achieved ablation on some of the left atrium roof as well (more than usual). These waveforms on the ablation monitor appeared that this area had likely already been affected by the patients 2 prior catheter based ablations. There was also an area down by the right inferior pulmonary vein that was able to be ablated as well. Following this, I withdrew the probe and  placed a JP drain attached to a bulb and closed this incision in 3 layers of absorbable suture.  Next, attention was turned to management of the left atrial appendage.   I then inflated  the left bump on the patient and placed local analgesia around the 2nd interspace along the mid-clavicular line. I then placed a 23m port there after the left lung was deflated and the lung placed on suction. A 531m30 degree camera was placed into the 2nd port and I viewed the pleural space. Following this, I placed local analgesia in the interspaces and skin for a 6th and 4th interspace port. Then, I placed a 34m47mort in the 4th space after a 67m39mrt was placed in the 6-7th space. Next, Ligasure was used to make a pericardial window staying well away from the phrenic nerve at least 1cm. Next, I dissected out the left atrial appendage. It was actually one of the tougher ones I have been involved with because there was a prominent fat pad that could not be moved or mobilized near the base of the appendage. I have performed this in patients with BMI > 50 and this was a large fat pad. More aggressive moves could be made, but this patient had refused any type of blood products preoperatively, for apparently non-religious reasons, so caution was of import as well. I placed on 434mm82miclip on the left atrial appendage. By echo this looked to still leave a fair amount of the base open. I then placed a 50mm 62mip over the base of the LAA. Typical resulted is <34mm re94mual LAA in most patients.. Small area of 7mm res57mal LAA in this patient. I then placed a 24 Fr chest tube and closed all incisions with 2 layers of absorbable sutures and dermabond.   Attention was turned to the heart rate. She was still in atrial fibrillation. I cardioverted her at 200 J twice. On the second cardioversion, she converted to sinus rhythm, but slow at around 30 beats per minute which increased to about 45 beats per minute before leaving the operating room. Her BP was adequate at all of these rates. The patient was extubated in the OR and in stable condition.

## 2022-11-25 ENCOUNTER — Telehealth: Payer: Self-pay

## 2022-11-25 NOTE — Telephone Encounter (Signed)
Ms. Twaddell made aware that she does not need to continue her oxygen. Will send fax to [employer] to discontinue O2 and supplies.

## 2022-11-25 NOTE — Telephone Encounter (Signed)
STD/FMLA form completed and faxed to [facility] @ [phone]. Beginning LOA 11/17/22 through 01/05/23/ 6 week post op recovery/ DOS 11/17/22

## 2022-11-25 NOTE — Telephone Encounter (Signed)
-----  Message from Neomia Glass, MD sent at 11/22/2022 10:47 AM EST ----- Regarding: RE: O2 supplies Lets cancel the 02. Those saturations sound good. She will improve with diuresis.  ----- Message ----- From: Donnella Sham, RN Sent: 11/21/2022  10:00 AM EST To: Neomia Glass, MD Subject: O2 supplies                                    Hey,  Patient is requesting discontinuing her oxygen as she feels that she does not need it. She states that she is using her pulse ox at home and it has been 95-97% since being home. She states that the O2 company is trying to deliver the oxygen supplies today and she is requesting that it be cancelled. I advised that we typically do not cancel o2 until she is seen in follow-up just incase she may need it. Do you want her to wait until follow-up to cancel it? Or go ahead and cancel it now?  Thanks, Caryl Pina

## 2022-11-26 ENCOUNTER — Other Ambulatory Visit: Payer: Self-pay | Admitting: Cardiothoracic Surgery

## 2022-11-26 DIAGNOSIS — I25119 Atherosclerotic heart disease of native coronary artery with unspecified angina pectoris: Secondary | ICD-10-CM

## 2022-11-27 ENCOUNTER — Encounter: Payer: Self-pay | Admitting: Cardiothoracic Surgery

## 2022-11-27 ENCOUNTER — Ambulatory Visit (INDEPENDENT_AMBULATORY_CARE_PROVIDER_SITE_OTHER): Payer: Self-pay | Admitting: Cardiothoracic Surgery

## 2022-11-27 ENCOUNTER — Other Ambulatory Visit: Payer: Self-pay

## 2022-11-27 VITALS — BP 137/80 | HR 79 | Resp 20 | Wt 199.0 lb

## 2022-11-27 DIAGNOSIS — I4819 Other persistent atrial fibrillation: Secondary | ICD-10-CM

## 2022-11-27 DIAGNOSIS — Z09 Encounter for follow-up examination after completed treatment for conditions other than malignant neoplasm: Secondary | ICD-10-CM

## 2022-11-27 MED ORDER — CEPHALEXIN 500 MG PO CAPS: 500.0000 mg | ORAL_CAPSULE | Freq: Three times a day (TID) | ORAL | 0 refills | Status: AC

## 2022-11-27 NOTE — Progress Notes (Signed)
Prescription of Keflex 500 mg PO TID x5 days sent into patient's preferred pharmacy [facility] per Dr. Binnie Kand request.

## 2022-11-28 ENCOUNTER — Ambulatory Visit
Admission: RE | Admit: 2022-11-28 | Discharge: 2022-11-28 | Disposition: A | Discharge status: Home / Self Care | Payer: BC Managed Care – PPO | Source: Ambulatory Visit | Attending: Cardiothoracic Surgery | Admitting: Cardiothoracic Surgery

## 2022-11-28 DIAGNOSIS — I25119 Atherosclerotic heart disease of native coronary artery with unspecified angina pectoris: Secondary | ICD-10-CM

## 2022-11-28 DIAGNOSIS — Z951 Presence of aortocoronary bypass graft: Secondary | ICD-10-CM | POA: Diagnosis not present

## 2022-11-28 DIAGNOSIS — M47814 Spondylosis without myelopathy or radiculopathy, thoracic region: Secondary | ICD-10-CM | POA: Diagnosis not present

## 2022-11-28 DIAGNOSIS — I2510 Atherosclerotic heart disease of native coronary artery without angina pectoris: Secondary | ICD-10-CM | POA: Diagnosis not present

## 2022-12-04 ENCOUNTER — Ambulatory Visit (INDEPENDENT_AMBULATORY_CARE_PROVIDER_SITE_OTHER): Payer: Self-pay | Admitting: Cardiothoracic Surgery

## 2022-12-04 VITALS — BP 118/71 | HR 85 | Resp 20 | Wt 203.0 lb

## 2022-12-04 DIAGNOSIS — Z09 Encounter for follow-up examination after completed treatment for conditions other than malignant neoplasm: Secondary | ICD-10-CM

## 2022-12-04 DIAGNOSIS — I4819 Other persistent atrial fibrillation: Secondary | ICD-10-CM

## 2022-12-04 NOTE — Progress Notes (Signed)
[address]       [place] [address]             [phone]                    Kayla Hendrix Kayla Hendrix #[id number] Date of Birth: 01-02-1951  Referring: Kayla Needs, NP Primary Care: Kayla Boston, MD Primary Cardiologist: None   S/P Convergent epicardial ablation and LAA Clip on 11/17/22    Feels like she is recovery  Is mostly but not completely to where she was before surgery Has been to the grocery store.    PHYSICAL EXAMINATION: BP 118/71   Pulse 85   Resp 20   Wt 203 lb (92.1 kg)   SpO2 94%   BMI 32.77 kg/m   Gen: NAD Neuro: Alert and oriented Chest: incision healing well Resp: Nonlaboured Abd: Soft, ntnd Extr: WWP, no LE edema bilateral  Diagnostic Studies & Laboratory data:    XR today: none   Assessment / Plan:    S/P Convergent epicardial ablation and LAA Clip on 11/17/22   Incision well healed, done with 5 d keflex 500 TID Is on eliquis Is done with colchicine Took medrol dose pack.    RTC prn  Told her happy to see in 4-6 wes   Overall, I would probably keep this patient on eliquis long-term. Discussed with her as well.       Kayla Hendrix Kayla Hendrix 12/04/2022 3:26 PM

## 2022-12-13 ENCOUNTER — Other Ambulatory Visit: Payer: Self-pay

## 2022-12-13 ENCOUNTER — Emergency Department: Payer: BC Managed Care – PPO

## 2022-12-13 ENCOUNTER — Emergency Department
Admission: EM | Admit: 2022-12-13 | Discharge: 2022-12-13 | Disposition: A | Discharge status: Home / Self Care | Payer: BC Managed Care – PPO | Attending: Emergency Medicine | Admitting: Emergency Medicine

## 2022-12-13 DIAGNOSIS — M545 Low back pain, unspecified: Secondary | ICD-10-CM | POA: Insufficient documentation

## 2022-12-13 DIAGNOSIS — M25562 Pain in left knee: Secondary | ICD-10-CM | POA: Diagnosis not present

## 2022-12-13 DIAGNOSIS — S3992XA Unspecified injury of lower back, initial encounter: Secondary | ICD-10-CM | POA: Diagnosis not present

## 2022-12-13 DIAGNOSIS — S2222XA Fracture of body of sternum, initial encounter for closed fracture: Secondary | ICD-10-CM | POA: Diagnosis not present

## 2022-12-13 DIAGNOSIS — Y92410 Unspecified street and highway as the place of occurrence of the external cause: Secondary | ICD-10-CM | POA: Diagnosis not present

## 2022-12-13 DIAGNOSIS — I700 Atherosclerosis of aorta: Secondary | ICD-10-CM | POA: Diagnosis not present

## 2022-12-13 DIAGNOSIS — S299XXA Unspecified injury of thorax, initial encounter: Secondary | ICD-10-CM | POA: Diagnosis not present

## 2022-12-13 DIAGNOSIS — K8020 Calculus of gallbladder without cholecystitis without obstruction: Secondary | ICD-10-CM | POA: Diagnosis not present

## 2022-12-13 DIAGNOSIS — S2242XA Multiple fractures of ribs, left side, initial encounter for closed fracture: Secondary | ICD-10-CM | POA: Diagnosis not present

## 2022-12-13 DIAGNOSIS — S2220XA Unspecified fracture of sternum, initial encounter for closed fracture: Secondary | ICD-10-CM | POA: Diagnosis not present

## 2022-12-13 MED ORDER — LIDOCAINE 5 % EX PTCH: 1.0000 | MEDICATED_PATCH | Freq: Two times a day (BID) | CUTANEOUS | 11 refills | Status: DC

## 2022-12-13 MED ORDER — OXYCODONE-ACETAMINOPHEN 5-325 MG PO TABS: 1.0000 | ORAL_TABLET | Freq: Four times a day (QID) | ORAL | 0 refills | Status: AC | PRN

## 2022-12-13 MED ORDER — ACETAMINOPHEN 500 MG PO TABS
1000.0000 mg | ORAL_TABLET | Freq: Once | ORAL | Status: AC
  Administered 2022-12-13: 1000 mg via ORAL
  Filled 2022-12-13: qty 2

## 2022-12-13 MED ORDER — OXYCODONE-ACETAMINOPHEN 5-325 MG PO TABS: 1.0000 | ORAL_TABLET | Freq: Four times a day (QID) | ORAL | 0 refills | Status: DC | PRN

## 2022-12-13 MED ORDER — OXYCODONE-ACETAMINOPHEN 5-325 MG PO TABS: 1.0000 | ORAL_TABLET | Freq: Once | ORAL | Status: DC

## 2022-12-13 MED ORDER — OXYCODONE HCL 5 MG PO TABS
5.0000 mg | ORAL_TABLET | Freq: Once | ORAL | Status: AC
  Administered 2022-12-13: 5 mg via ORAL
  Filled 2022-12-13: qty 1
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
## 2022-12-13 NOTE — Discharge Instructions (Addendum)
-  You are found to have multiple rib fractures (4 through 10th) on your left side, as well as a nondisplaced sternum fracture.  This will heal on its own with time.  You may take the oxycodone/acetaminophen as needed for pain.  You may additionally utilize the lidocaine patches.  Please follow-up with your primary care provider for reevaluation within the next few days.  -Please continue to take deep breaths throughout the day to prevent pneumonia.  -Return to the emergency department anytime if you begin to experience any new or worsening symptoms.

## 2022-12-13 NOTE — ED Notes (Signed)
Pt complaining of pain with movement, is able to tolerate lying still.
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
## 2022-12-18 ENCOUNTER — Encounter (HOSPITAL_COMMUNITY): Payer: Self-pay | Admitting: *Deleted

## 2022-12-19 DIAGNOSIS — S2220XA Unspecified fracture of sternum, initial encounter for closed fracture: Secondary | ICD-10-CM | POA: Diagnosis not present

## 2022-12-19 DIAGNOSIS — K8020 Calculus of gallbladder without cholecystitis without obstruction: Secondary | ICD-10-CM | POA: Diagnosis not present

## 2022-12-19 DIAGNOSIS — I1310 Hypertensive heart and chronic kidney disease without heart failure, with stage 1 through stage 4 chronic kidney disease, or unspecified chronic kidney disease: Secondary | ICD-10-CM | POA: Diagnosis not present

## 2022-12-19 DIAGNOSIS — S2242XA Multiple fractures of ribs, left side, initial encounter for closed fracture: Secondary | ICD-10-CM | POA: Diagnosis not present

## 2022-12-22 ENCOUNTER — Ambulatory Visit: Payer: BC Managed Care – PPO | Attending: Cardiology | Admitting: Cardiology

## 2022-12-22 ENCOUNTER — Encounter: Payer: Self-pay | Admitting: Cardiology

## 2022-12-22 VITALS — BP 126/74 | HR 80 | Ht 66.0 in | Wt 208.0 lb

## 2022-12-22 DIAGNOSIS — I2510 Atherosclerotic heart disease of native coronary artery without angina pectoris: Secondary | ICD-10-CM

## 2022-12-22 DIAGNOSIS — I10 Essential (primary) hypertension: Secondary | ICD-10-CM

## 2022-12-22 DIAGNOSIS — D6869 Other thrombophilia: Secondary | ICD-10-CM | POA: Diagnosis not present

## 2022-12-22 DIAGNOSIS — I4819 Other persistent atrial fibrillation: Secondary | ICD-10-CM
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
## 2022-12-22 NOTE — Patient Instructions (Signed)
Medication Instructions:  Your physician has recommended you make the following change in your medication:  INCREASE your Lasix to 60 mg daily for the next 4 days. Increase your daily intake of Potassium rich foods during this 4 days.  *If you need a refill on your cardiac medications before your next appointment, please call your pharmacy*   Lab Work: None ordered   Testing/Procedures: None ordered   Follow-Up: At [facility], you and your health needs are our priority.  As part of our continuing mission to provide you with exceptional heart care, we have created designated Provider Care Teams.  These Care Teams include your primary Cardiologist (physician) and Advanced Practice Providers (APPs -  Physician Assistants and Nurse Practitioners) who all work together to provide you with the care you need, when you need it.   Your next appointment:   April  (to set you up for a left heart catheterization)  The format for your next appointment:   In Person  Provider:   You will see one of the following Advanced Practice Providers on your designated Care Team:   Tommye Standard, Orlinda Blalock "Jonni Sanger" Chalmers Cater, [place] {  Keep your scheduled follow up in June with Dr. Curt Bears.   Thank you for choosing [facility]!!   Trinidad Curet, RN [phone]

## 2022-12-26 ENCOUNTER — Telehealth: Payer: Self-pay | Admitting: Cardiology

## 2022-12-26 NOTE — Telephone Encounter (Signed)
Pt advised to take extra for 3 more days. Advised to increase her intake of potassium rich foods. She will update [place] next week if no improvement.

## 2022-12-26 NOTE — Telephone Encounter (Signed)
Pt c/o medication issue:  1. Name of Medication:   furosemide (LASIX) 20 MG tablet    2. How are you currently taking this medication (dosage and times per day)? TAKE 1 & 1/2 TABLET BY MOUTH DAILY FOR FLUIDPatient taking differently: Take 30 mg by mouth daily.   3. Are you having a reaction (difficulty breathing--STAT)? No  4. What is your medication issue? Pt states that at visit on 2/12 she was told to increase medication due to swelling. Pt says swelling still continues and would like to know if medication should be increased again. Please advise

## 2023-01-04 NOTE — Progress Notes (Unsigned)
      [address]       [place] [address]             [place] Record [id number] Date of Birth: [birth year]  Referring: [name], MD Primary Care: [name], MD Primary Cardiologist: None  S/P Convergent epicardial ablation and LAA Clip on 11/17/22    Has or has not been in AF alot  PHYSICAL EXAMINATION: There were no vitals taken for this visit.  Gen: NAD Neuro: Alert and oriented Chest: incision healing well Resp: Nonlaboured Abd: Soft, ntnd Extr: WWP, no LE edema bilateral  Diagnostic Studies & Laboratory data:    XR today:   Assessment / Plan:   S/P Convergent epicardial ablation and LAA Clip on 11/17/22    Is or is not doing well in sinus Is on eliquis Overall, I would probably keep this patient on eliquis long-term. Discussed with her as well.   RTC prn  [name] Arnetia Bronk 01/04/2023 11:58 AM

## 2023-01-08 ENCOUNTER — Other Ambulatory Visit: Payer: Self-pay

## 2023-01-08 ENCOUNTER — Ambulatory Visit (INDEPENDENT_AMBULATORY_CARE_PROVIDER_SITE_OTHER): Payer: Self-pay | Admitting: Cardiothoracic Surgery

## 2023-01-08 VITALS — BP 127/65 | HR 64 | Resp 20 | Ht 66.0 in | Wt 200.0 lb

## 2023-01-08 DIAGNOSIS — I4819 Other persistent atrial fibrillation: Secondary | ICD-10-CM

## 2023-01-08 DIAGNOSIS — Z09 Encounter for follow-up examination after completed treatment for conditions other than malignant neoplasm: Secondary | ICD-10-CM

## 2023-01-08 MED ORDER — CEPHALEXIN 500 MG PO CAPS: 500.0000 mg | ORAL_CAPSULE | Freq: Three times a day (TID) | ORAL | 0 refills | Status: DC

## 2023-01-08 NOTE — Progress Notes (Signed)
Filled per Dr. Tenny Craw at patient visit.

## 2023-01-09 ENCOUNTER — Telehealth: Payer: Self-pay | Admitting: *Deleted
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
## 2023-01-13 ENCOUNTER — Telehealth: Payer: Self-pay | Admitting: Cardiology

## 2023-01-13 NOTE — Telephone Encounter (Signed)
Pt re 
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
## 2023-01-13 NOTE — Telephone Encounter (Signed)
Pt c/o swelling: STAT is pt has developed SOB within 24 hours  How much weight have you gained and in what time span? N/a  If swelling, where is the swelling located? Feet  Are you currently taking a fluid pill? yes  Are you currently SOB? no  Do you have a log of your daily weights (if so, list)? N/a  Have you gained 3 pounds in a day or 5 pounds in a week? N/a  Have you traveled recently? no     Pt would like to also speak with nurse about paperwork. Please advise.

## 2023-01-14 NOTE — Telephone Encounter (Signed)
Pt made aware Dr. Curt Bears agreeable -- pt should see PCP about these issues/concerns. Pt agreeable and informs me that she did speak w/ PCP today about this. She appreciates my call and agreeable to plan.

## 2023-01-20 ENCOUNTER — Telehealth: Payer: Self-pay | Admitting: Cardiology

## 2023-01-20 NOTE — Telephone Encounter (Signed)
Patient BP 137/80 HR currently 112. Per Adline Peals PA will add extra '25mg'$  of metoprolol daily  Swelling of lower extremities is unchanged no weight gain - extra lasix had no effect on swelling. Encouraged compression stockings and leg elevation. -If still in afib end of week will notify office for further instruction.

## 2023-01-20 NOTE — Telephone Encounter (Signed)
Pt stated her swelling has not decreased , she did contact her PCP  and was advised to "prop her feet up". Pt thinks she is in  A-fib since last night , HR 119 (today) HR 129 (last night). She is also seeking advise on completing her disability paperwork, pt expressed concerned on losing her job. Pt was taking  Metoprolol 25 mg 2x daily however,she was advised by A-fib clinic to decrease the medication to half tablet morning and evening due to low blood pressure. MD and nurse are current not in the office. Will forward to A-fib clinic.

## 2023-01-20 NOTE — Telephone Encounter (Signed)
Patient is asking that the nurse gives her a call back. Please advise
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
## 2023-01-23 NOTE — Telephone Encounter (Signed)
Pt continues in Afib will bring in for assessment.

## 2023-01-26 ENCOUNTER — Ambulatory Visit (HOSPITAL_COMMUNITY)
Admission: RE | Admit: 2023-01-26 | Discharge: 2023-01-26 | Disposition: A | Discharge status: Home / Self Care | Payer: BC Managed Care – PPO | Source: Ambulatory Visit | Attending: Nurse Practitioner | Admitting: Nurse Practitioner

## 2023-01-26 ENCOUNTER — Encounter (HOSPITAL_COMMUNITY): Payer: Self-pay | Admitting: Nurse Practitioner

## 2023-01-26 VITALS — BP 122/76 | HR 115 | Ht 66.0 in | Wt 207.2 lb

## 2023-01-26 DIAGNOSIS — Z7901 Long term (current) use of anticoagulants: Secondary | ICD-10-CM | POA: Diagnosis not present

## 2023-01-26 DIAGNOSIS — I2510 Atherosclerotic heart disease of native coronary artery without angina pectoris: Secondary | ICD-10-CM | POA: Insufficient documentation

## 2023-01-26 DIAGNOSIS — Z87891 Personal history of nicotine dependence: Secondary | ICD-10-CM | POA: Diagnosis not present

## 2023-01-26 DIAGNOSIS — R600 Localized edema: Secondary | ICD-10-CM | POA: Insufficient documentation

## 2023-01-26 DIAGNOSIS — D6869 Other thrombophilia: Secondary | ICD-10-CM | POA: Diagnosis not present

## 2023-01-26 DIAGNOSIS — I4819 Other persistent atrial fibrillation: Secondary | ICD-10-CM | POA: Insufficient documentation
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
## 2023-01-26 NOTE — Patient Instructions (Addendum)
Increase lasix to 60mg  for the next 3 days then back to normal dosing   Cardioversion scheduled for: Friday, April 5th   - Arrive at the [facility] and go to admitting at [place] not eat or drink anything after midnight the night prior to your procedure.   - Take all your morning medication (except diabetic medications) with a sip of water prior to arrival.  - You will not be able to drive home after your procedure.    - Do NOT miss any doses of your blood thinner - if you should miss a dose please notify our office immediately.   - If you feel as if you go back into normal rhythm prior to scheduled cardioversion, please notify our office immediately.   If your procedure is canceled in the cardioversion suite you will be charged a cancellation fee.    Hold medication 7 days prior to scheduled procedure/anesthesia.  Restart medication on the normal dosing day after scheduled procedure/anesthesia  Dulaglutide (Trulicity) Exenatide extended release (Bydureon bcise) Semaglutide (Ozempic) (WEGOVY)  Tirzepatide (Mounjaro)     Hold medication 24 hours prior to scheduled procedure/anesthesia.   Restart medication on the following day after scheduled procedure/anesthesia   Exenatide (Byetta)  Liraglutide (Victoza, Saxenda)  Lixisenatide (Adlyxin)  Semaglutide (Rybelsus) Polyethylene Glycol Loxenatide   For those patients who have a scheduled procedure/anesthesia on the same day of the week as their dose, hold the medication on the day of surgery.  They can take their scheduled dose the week before.  **Patients on the above medications scheduled for elective procedures that have not held the medication for the appropriate amount of time are at risk of cancellation or change in the anesthetic plan.

## 2023-02-05 ENCOUNTER — Other Ambulatory Visit: Payer: Self-pay

## 2023-02-05 ENCOUNTER — Ambulatory Visit (HOSPITAL_COMMUNITY)
Admission: RE | Admit: 2023-02-05 | Discharge: 2023-02-05 | Disposition: A | Discharge status: Home / Self Care | Payer: BC Managed Care – PPO | Source: Ambulatory Visit | Attending: Physician Assistant | Admitting: Physician Assistant

## 2023-02-05 ENCOUNTER — Ambulatory Visit (INDEPENDENT_AMBULATORY_CARE_PROVIDER_SITE_OTHER): Payer: BC Managed Care – PPO | Admitting: Cardiothoracic Surgery

## 2023-02-05 VITALS — BP 97/64 | HR 115 | Resp 20 | Ht 66.0 in | Wt 208.0 lb

## 2023-02-05 DIAGNOSIS — I4819 Other persistent atrial fibrillation: Secondary | ICD-10-CM | POA: Diagnosis not present

## 2023-02-05 DIAGNOSIS — Z09 Encounter for follow-up examination after completed treatment for conditions other than malignant neoplasm: Secondary | ICD-10-CM | POA: Diagnosis not present

## 2023-02-05 DIAGNOSIS — M7989 Other specified soft tissue disorders: Secondary | ICD-10-CM

## 2023-02-05 DIAGNOSIS — I4891 Unspecified atrial fibrillation: Secondary | ICD-10-CM | POA: Insufficient documentation

## 2023-02-05 DIAGNOSIS — M79662 Pain in left lower leg: Secondary | ICD-10-CM

## 2023-02-05 LAB — CBC
HCT: 38.4 % (ref 36.0–46.0)
Hemoglobin: 11.7 g/dL — ABNORMAL LOW (ref 12.0–15.0)
MCH: 22.5 pg — ABNORMAL LOW (ref 26.0–34.0)
MCHC: 30.5 g/dL (ref 30.0–36.0)
MCV: 73.8 fL — ABNORMAL LOW (ref 80.0–100.0)
Platelets: 254 10*3/uL (ref 150–400)
RBC: 5.2 MIL/uL — ABNORMAL HIGH (ref 3.87–5.11)
RDW: 18.4 % — ABNORMAL HIGH (ref 11.5–15.5)
WBC: 7 10*3/uL (ref 4.0–10.5)
nRBC: 0 % (ref 0.0–0.2)

## 2023-02-05 LAB — BASIC METABOLIC PANEL
Anion gap: 10 (ref 5–15)
BUN: 11 mg/dL (ref 8–23)
CO2: 29 mmol/L (ref 22–32)
Calcium: 8.8 mg/dL — ABNORMAL LOW (ref 8.9–10.3)
Chloride: 104 mmol/L (ref 98–111)
Creatinine, Ser: 0.98 mg/dL (ref 0.44–1.00)
GFR, Estimated: >60 mL/min (ref >60)
Glucose, Bld: 102 mg/dL — ABNORMAL HIGH (ref 70–99)
Potassium: 4.3 mmol/L (ref 3.5–5.1)
Sodium: 143 mmol/L (ref 135–145)
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
## 2023-02-06 ENCOUNTER — Telehealth: Payer: Self-pay | Admitting: Cardiology

## 2023-02-06 ENCOUNTER — Ambulatory Visit (HOSPITAL_COMMUNITY)
Admission: RE | Admit: 2023-02-06 | Discharge: 2023-02-06 | Disposition: A | Discharge status: Home / Self Care | Payer: BC Managed Care – PPO | Source: Ambulatory Visit | Attending: Cardiovascular Disease | Admitting: Cardiovascular Disease

## 2023-02-06 DIAGNOSIS — M7989 Other specified soft tissue disorders: Secondary | ICD-10-CM

## 2023-02-06 DIAGNOSIS — M79662 Pain in left lower leg: Secondary | ICD-10-CM | POA: Diagnosis not present

## 2023-02-06 DIAGNOSIS — I480 Paroxysmal atrial fibrillation: Secondary | ICD-10-CM

## 2023-02-06 MED ORDER — APIXABAN 5 MG PO TABS: 5.0000 mg | ORAL_TABLET | Freq: Two times a day (BID) | ORAL | 1 refills | Status: DC

## 2023-02-06 NOTE — Telephone Encounter (Signed)
*  STAT* If patient is at the pharmacy, call can be transferred to refill team.   1. Which medications need to be refilled? (please list name of each medication and dose if known) apixaban (ELIQUIS) 5 MG TABS tablet   2. Which pharmacy/location (including street and city if local pharmacy) is medication to be sent to?  [id number] - [place],  - [address]    3. Do they need a 30 day or 90 day supply? 90  Patient is out of medication

## 2023-02-06 NOTE — Telephone Encounter (Signed)
Prescription refill request for Eliquis received.  Indication: afib  Last office visit: Kayleen Memos 01/26/2023, Curt Bears 12/22/2022 Scr: 0.98, 02/05/2023 Age: 72  Weight: 94.3 kg   Refill sent.
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
## 2023-02-13 ENCOUNTER — Ambulatory Visit (HOSPITAL_COMMUNITY): Payer: BC Managed Care – PPO | Admitting: Certified Registered Nurse Anesthetist

## 2023-02-13 ENCOUNTER — Encounter (HOSPITAL_COMMUNITY): Payer: Self-pay | Admitting: Internal Medicine

## 2023-02-13 ENCOUNTER — Other Ambulatory Visit: Payer: Self-pay

## 2023-02-13 ENCOUNTER — Encounter (HOSPITAL_COMMUNITY)
Admission: RE | Disposition: A | Discharge status: Home / Self Care | Payer: Self-pay | Source: Home / Self Care | Attending: Internal Medicine

## 2023-02-13 ENCOUNTER — Ambulatory Visit (HOSPITAL_COMMUNITY)
Admission: RE | Admit: 2023-02-13 | Discharge: 2023-02-13 | Disposition: A | Discharge status: Home / Self Care | Payer: BC Managed Care – PPO | Attending: Internal Medicine | Admitting: Internal Medicine

## 2023-02-13 DIAGNOSIS — I10 Essential (primary) hypertension: Secondary | ICD-10-CM | POA: Diagnosis not present

## 2023-02-13 DIAGNOSIS — I2510 Atherosclerotic heart disease of native coronary artery without angina pectoris: Secondary | ICD-10-CM | POA: Diagnosis not present

## 2023-02-13 DIAGNOSIS — Z7901 Long term (current) use of anticoagulants: Secondary | ICD-10-CM | POA: Diagnosis not present

## 2023-02-13 DIAGNOSIS — I4891 Unspecified atrial fibrillation: Secondary | ICD-10-CM | POA: Insufficient documentation

## 2023-02-13 DIAGNOSIS — I4819 Other persistent atrial fibrillation: Secondary | ICD-10-CM | POA: Diagnosis not present

## 2023-02-13 DIAGNOSIS — Z79899 Other long term (current) drug therapy: Secondary | ICD-10-CM | POA: Diagnosis not present

## 2023-02-13 DIAGNOSIS — Z87891 Personal history of nicotine dependence: Secondary | ICD-10-CM | POA: Diagnosis not present

## 2023-02-13 HISTORY — PX: CARDIOVERSION: SHX1299

## 2023-02-13 LAB — GLUCOSE, CAPILLARY: Glucose-Capillary: 102 mg/dL — ABNORMAL HIGH (ref 70–99)

## 2023-02-13 LAB — POCT I-STAT, CHEM 8
BUN: 14 mg/dL (ref 8–23)
Calcium, Ion: 1.14 mmol/L — ABNORMAL LOW (ref 1.15–1.40)
Chloride: 101 mmol/L (ref 98–111)
Creatinine, Ser: 1 mg/dL (ref 0.44–1.00)
Glucose, Bld: 102 mg/dL — ABNORMAL HIGH (ref 70–99)
HCT: 33 % — ABNORMAL LOW (ref 36.0–46.0)
Hemoglobin: 11.2 g/dL — ABNORMAL LOW (ref 12.0–15.0)
Potassium: 3.9 mmol/L (ref 3.5–5.1)
Sodium: 141 mmol/L (ref 135–145)
TCO2: 28 mmol/L (ref 22–32)

## 2023-02-13 SURGERY — CARDIOVERSION
Anesthesia: General

## 2023-02-13 MED ORDER — LIDOCAINE 2% (20 MG/ML) 5 ML SYRINGE
INTRAMUSCULAR | Status: DC | PRN
  Administered 2023-02-13: 100 mg via INTRAVENOUS

## 2023-02-13 MED ORDER — PROPOFOL 10 MG/ML IV BOLUS
INTRAVENOUS | Status: DC | PRN
  Administered 2023-02-13: 90 mg via INTRAVENOUS

## 2023-02-13 MED ORDER — SODIUM CHLORIDE 0.9 % IV SOLN: INTRAVENOUS | Status: DC

## 2023-02-13 NOTE — Anesthesia Preprocedure Evaluation (Signed)
Anesthesia Evaluation  Patient identified by MRN, date of birth, ID band Patient awake    Reviewed: Allergy & Precautions, NPO status , Patient's Chart, lab work & pertinent test results  History of Anesthesia Complications Negative for: history of anesthetic complications  Airway Mallampati: II  TM Distance: >3 FB Neck ROM: Full    Dental  (+) Missing, Poor Dentition, Dental Advisory Given   Pulmonary sleep apnea , former smoker   breath sounds clear to auscultation       Cardiovascular hypertension, Pt. on medications + CAD  negative cardio ROS Atrial Fibrillation  Rhythm:Irregular Rate:Abnormal  TTE 09/2020: EF 60-65%, mild LVH, mild LAE, mild MR    Neuro/Psych   Anxiety Depression    negative neurological ROS  negative psych ROS   GI/Hepatic negative GI ROS, Neg liver ROS,GERD  Medicated and Controlled,,  Endo/Other  negative endocrine ROSdiabetes, Type 2, Oral Hypoglycemic Agents    Renal/GU negative Renal ROS  negative genitourinary   Musculoskeletal negative musculoskeletal ROS (+)    Abdominal  (+) + obese  Peds negative pediatric ROS (+)  Hematology negative hematology ROS (+) Hgb 9.7   Anesthesia Other Findings Day of surgery medications reviewed with patient.  Reproductive/Obstetrics negative OB ROS                             Anesthesia Physical Anesthesia Plan  ASA: 3  Anesthesia Plan: General   Post-op Pain Management:    Induction: Intravenous  PONV Risk Score and Plan: 3 and Propofol infusion  Airway Management Planned: Natural Airway and Simple Face Mask  Additional Equipment: None  Intra-op Plan:   Post-operative Plan:   Informed Consent: I have reviewed the patients History and Physical, chart, labs and discussed the procedure including the risks, benefits and alternatives for the proposed anesthesia with the patient or authorized representative who has  indicated his/her understanding and acceptance.       Plan Discussed with: CRNA  Anesthesia Plan Comments:         Anesthesia Quick Evaluation

## 2023-02-13 NOTE — Anesthesia Procedure Notes (Signed)
Procedure Name: General with mask airway Date/Time: 02/13/2023 10:08 AM  Performed by: Garfield Cornea, CRNAPre-anesthesia Checklist: Patient identified, Emergency Drugs available, Suction available and Patient being monitored Patient Re-evaluated:Patient Re-evaluated prior to induction Oxygen Delivery Method: Ambu bag Preoxygenation: Pre-oxygenation with 100% oxygen Induction Type: IV induction Dental Injury: Teeth and Oropharynx as per pre-operative assessment

## 2023-02-13 NOTE — Discharge Instructions (Signed)

## 2023-02-13 NOTE — Transfer of Care (Signed)
Immediate Anesthesia Transfer of Care Note  Patient: Kayla Hendrix  Procedure(s) Performed: CARDIOVERSION  Patient Location: Endoscopy Unit  Anesthesia Type:General  Level of Consciousness: drowsy  Airway & Oxygen Therapy: Patient Spontanous Breathing  Post-op Assessment: Report given to RN and Post -op Vital signs reviewed and stable  Post vital signs: Reviewed and stable  Last Vitals:  Vitals Value Taken Time  BP 114/66   Temp    Pulse 72   Resp 13   SpO2 94     Last Pain:  Vitals:   02/13/23 0812  TempSrc: Temporal  PainSc: 0-No pain         Complications: No notable events documented.

## 2023-02-13 NOTE — CV Procedure (Signed)
Procedure: Electrical Cardioversion Indications:  Atrial Fibrillation  Procedure Details:  Consent: Risks of procedure as well as the alternatives and risks of each were explained to the (patient/caregiver).  Consent for procedure obtained.  Time Out: Verified patient identification, verified procedure, site/side was marked, verified correct patient position, special equipment/implants available, medications/allergies/relevent history reviewed, required imaging and test results available. PERFORMED.  Patient placed on cardiac monitor, pulse oximetry, supplemental oxygen as necessary.  Sedation given:  propofol per anesthesia Pacer pads placed anterior and posterior chest.  Cardioverted 1 time(s).  Cardioversion with synchronized biphasic 120J shock.  Evaluation: Findings: Post procedure EKG shows: NSR Complications: None Patient did tolerate procedure well.  Time Spent Directly with the Patient:  30 minutes   Parke Poisson 02/13/2023, 10:12 AM

## 2023-02-13 NOTE — Interval H&P Note (Signed)
History and Physical Interval Note:  02/13/2023 8:53 AM  Kayla Hendrix  has presented today for surgery, with the diagnosis of AFIB.  The various methods of treatment have been discussed with the patient and family. After consideration of risks, benefits and other options for treatment, the patient has consented to  Procedure(s): CARDIOVERSION (N/A) as a surgical intervention.  The patient's history has been reviewed, patient examined, no change in status, stable for surgery.  I have reviewed the patient's chart and labs.  Questions were answered to the patient's satisfaction.     Parke Poisson

## 2023-02-13 NOTE — Anesthesia Postprocedure Evaluation (Signed)
Anesthesia Post Note  Patient: Kayla Hendrix  Procedure(s) Performed: CARDIOVERSION     Patient location during evaluation: PACU Anesthesia Type: General Level of consciousness: awake and alert Pain management: pain level controlled Vital Signs Assessment: post-procedure vital signs reviewed and stable Respiratory status: spontaneous breathing, nonlabored ventilation and respiratory function stable Cardiovascular status: blood pressure returned to baseline and stable Postop Assessment: no apparent nausea or vomiting Anesthetic complications: no   No notable events documented.  Last Vitals:  Vitals:   02/13/23 1030 02/13/23 1035  BP: 114/61 113/63  Pulse: 70 70  Resp: 18 19  Temp:    SpO2: 96% 95%    Last Pain:  Vitals:   02/13/23 1035  TempSrc:   PainSc: 0-No pain                 Lowella Curb

## 2023-02-16 ENCOUNTER — Encounter: Payer: Self-pay | Admitting: *Deleted

## 2023-02-16 ENCOUNTER — Ambulatory Visit: Payer: BC Managed Care – PPO | Attending: Physician Assistant | Admitting: Physician Assistant

## 2023-02-16 ENCOUNTER — Encounter: Payer: Self-pay | Admitting: Physician Assistant

## 2023-02-16 VITALS — BP 118/66 | HR 74 | Ht 66.0 in | Wt 208.6 lb

## 2023-02-16 DIAGNOSIS — D6869 Other thrombophilia: Secondary | ICD-10-CM

## 2023-02-16 DIAGNOSIS — R609 Edema, unspecified: Secondary | ICD-10-CM | POA: Diagnosis not present

## 2023-02-16 DIAGNOSIS — I10 Essential (primary) hypertension: Secondary | ICD-10-CM | POA: Diagnosis not present

## 2023-02-16 DIAGNOSIS — I4819 Other persistent atrial fibrillation: Secondary | ICD-10-CM | POA: Diagnosis not present

## 2023-02-16 DIAGNOSIS — I2510 Atherosclerotic heart disease of native coronary artery without angina pectoris: Secondary | ICD-10-CM | POA: Diagnosis not present

## 2023-02-16 MED ORDER — SPIRONOLACTONE 25 MG PO TABS: 12.5000 mg | ORAL_TABLET | Freq: Every day | ORAL | 3 refills | Status: DC

## 2023-02-16 NOTE — Patient Instructions (Addendum)
Medication Instructions:   START TAKING : SPRINOLACTONE 12.5 MG ONCE A DAY   *If you need a refill on your cardiac medications before your next appointment, please call your pharmacy*   Lab Work:  RETURN FOR LABS IN ONE WEEK DAY AFTER  4-24    If you have labs (blood work) drawn today and your tests are completely normal, you will receive your results only by: MyChart Message (if you have MyChart) OR A paper copy in the mail If you have any lab test that is abnormal or we need to change your treatment, we will call you to review the results.   Testing/Procedures:  SCHEDULED 04-03-23 Your physician has requested that you have a cardiac catheterization. Cardiac catheterization is used to diagnose and/or treat various heart conditions. Doctors may recommend this procedure for a number of different reasons. The most common reason is to evaluate chest pain. Chest pain can be a symptom of coronary artery disease (CAD), and cardiac catheterization can show whether plaque is narrowing or blocking your heart's arteries. This procedure is also used to evaluate the valves, as well as measure the blood flow and oxygen levels in different parts of your heart. For further information please visit [email or web address] Please follow instruction sheet, as given.     Follow-Up: At [facility], you and your health needs are our priority.  As part of our continuing mission to provide you with exceptional heart care, we have created designated Provider Care Teams.  These Care Teams include your primary Cardiologist (physician) and Advanced Practice Providers (APPs -  Physician Assistants and Nurse Practitioners) who all work together to provide you with the care you need, when you need it.  We recommend signing up for the patient portal called "MyChart".  Sign up information is provided on this After Visit Summary.  MyChart is used to connect with patients for Virtual Visits (Telemedicine).  Patients are  able to view lab/test results, encounter notes, upcoming appointments, etc.  Non-urgent messages can be sent to your provider as well.   To learn more about what you can do with MyChart, go to [email or web address].    Your next appointment:  DR Eldridge Dace 2-3 MONTHS POST STENT  1-2  month(s)  Provider:   [name], MD    Other Instructions

## 2023-02-20 DIAGNOSIS — E1122 Type 2 diabetes mellitus with diabetic chronic kidney disease: Secondary | ICD-10-CM | POA: Diagnosis not present

## 2023-02-20 DIAGNOSIS — J439 Emphysema, unspecified: Secondary | ICD-10-CM | POA: Diagnosis not present

## 2023-02-20 DIAGNOSIS — I129 Hypertensive chronic kidney disease with stage 1 through stage 4 chronic kidney disease, or unspecified chronic kidney disease: Secondary | ICD-10-CM | POA: Diagnosis not present

## 2023-02-20 DIAGNOSIS — E1142 Type 2 diabetes mellitus with diabetic polyneuropathy: Secondary | ICD-10-CM | POA: Diagnosis not present

## 2023-03-02 ENCOUNTER — Telehealth (HOSPITAL_COMMUNITY): Payer: Self-pay | Admitting: *Deleted

## 2023-03-02 NOTE — Telephone Encounter (Signed)
Patient called to Af clinic asking to forward information to Dr. Damita Dunnings PA - Pt states she is back in AF - her cardioversion lasted about 2 weeks. HRs are between 57-110 BP borderline low so she has not taken extra metoprolol. Still complains of LLE despite addition of Spironolactone feels Right leg is worse than left. She has returned to work. Pt is scheduled for stent placement in May. Pt states she is best reached at [phone]

## 2023-03-02 NOTE — Telephone Encounter (Signed)
Pt still feels like she is still experiencing afib, "but is seems different", because "my heart rates have not been over 100". Informed of MD recommendation to start Amiodarone after her PCI end of next month. Pt reports that she has tried this before and did not do well, does he recommend something different? It was stopped in 2021 d/t SE - nausea/diarrhea/sob. Aware will forward to him for review/advisement.  Pt also concerned b/c she is still experiencing BLEE, left greater than right. Says PCP said discuss w/ Cardiology.  Dr Delia Chimes order doppler to r/o DVT.  No findings so far, but still an issue. Aware will also ask Dr. Elberta Fortis to weigh in on this as well.  Pt aware will let he know what MD says. Holding afib ablation date for 8/23 Patient verbalized understanding and agreeable to plan.

## 2023-03-03 NOTE — Telephone Encounter (Signed)
Left message to call back   Per [place]: (Pt needs an appt w/ Francis Dowse for f/u on LEE, even though she was just seen 4/8) (Pt only option is Amidarone/Tikosyn)

## 2023-03-04 ENCOUNTER — Ambulatory Visit: Payer: BC Managed Care – PPO | Attending: Physician Assistant

## 2023-03-04 DIAGNOSIS — I2510 Atherosclerotic heart disease of native coronary artery without angina pectoris: Secondary | ICD-10-CM

## 2023-03-05 LAB — BASIC METABOLIC PANEL
BUN/Creatinine Ratio: 13 (ref 12–28)
BUN: 13 mg/dL (ref 8–27)
CO2: 21 mmol/L (ref 20–29)
Calcium: 8.8 mg/dL (ref 8.7–10.3)
Chloride: 104 mmol/L (ref 96–106)
Creatinine, Ser: 0.98 mg/dL (ref 0.57–1.00)
Glucose: 115 mg/dL — ABNORMAL HIGH (ref 70–99)
Potassium: 4.3 mmol/L (ref 3.5–5.2)
Sodium: 142 mmol/L (ref 134–144)
eGFR: 62 mL/min/{1.73_m2} (ref >59)

## 2023-03-05 LAB — CBC
Hematocrit: 35.9 % (ref 34.0–46.6)
Hemoglobin: 11.4 g/dL (ref 11.1–15.9)
MCH: 23.2 pg — ABNORMAL LOW (ref 26.6–33.0)
MCHC: 31.8 g/dL (ref 31.5–35.7)
MCV: 73 fL — ABNORMAL LOW (ref 79–97)
Platelets: 249 10*3/uL (ref 150–450)
RBC: 4.92 x10E6/uL (ref 3.77–5.28)
RDW: 18.1 % — ABNORMAL HIGH (ref 11.7–15.4)
WBC: 6.2 10*3/uL (ref 3.4–10.8)
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
## 2023-03-30 ENCOUNTER — Encounter: Payer: Self-pay | Admitting: Physician Assistant

## 2023-03-30 ENCOUNTER — Ambulatory Visit: Payer: BC Managed Care – PPO | Attending: Physician Assistant | Admitting: Physician Assistant

## 2023-03-30 ENCOUNTER — Other Ambulatory Visit: Payer: Self-pay | Admitting: *Deleted

## 2023-03-30 VITALS — BP 126/66 | HR 67 | Ht 66.0 in | Wt 210.6 lb

## 2023-03-30 DIAGNOSIS — I5032 Chronic diastolic (congestive) heart failure: Secondary | ICD-10-CM

## 2023-03-30 DIAGNOSIS — I10 Essential (primary) hypertension: Secondary | ICD-10-CM

## 2023-03-30 DIAGNOSIS — Z01818 Encounter for other preprocedural examination: Secondary | ICD-10-CM

## 2023-03-30 DIAGNOSIS — I4819 Other persistent atrial fibrillation: Secondary | ICD-10-CM

## 2023-03-30 DIAGNOSIS — I2510 Atherosclerotic heart disease of native coronary artery without angina pectoris: Secondary | ICD-10-CM | POA: Diagnosis not present

## 2023-03-30 DIAGNOSIS — Z01812 Encounter for preprocedural laboratory examination: Secondary | ICD-10-CM

## 2023-03-30 DIAGNOSIS — D6869 Other thrombophilia: Secondary | ICD-10-CM

## 2023-03-30 MED ORDER — FUROSEMIDE 40 MG PO TABS: 40.0000 mg | ORAL_TABLET | Freq: Two times a day (BID) | ORAL | 3 refills | Status: DC

## 2023-03-30 NOTE — Patient Instructions (Addendum)
Medication Instructions:   START TAKING:  FUROSEMIDE  40 MG TWICE  A DAY   NO OZEMPIC TODAY DUE TO PROCEDURE FRIDAY    *If you need a refill on your cardiac medications before your next appointment, please call your pharmacy*   Lab Work: CBC AND BMET TODAY    If you have labs (blood work) drawn today and your tests are completely normal, you will receive your results only by: MyChart Message (if you have MyChart) OR A paper copy in the mail If you have any lab test that is abnormal or we need to change your treatment, we will call you to review the results.   Testing/Procedures: SEE INSTRUCTIONS FOR HEART CATHERIZATION     Follow-Up: At [facility], you and your health needs are our priority.  As part of our continuing mission to provide you with exceptional heart care, we have created designated Provider Care Teams.  These Care Teams include your primary Cardiologist (physician) and Advanced Practice Providers (APPs -  Physician Assistants and Nurse Practitioners) who all work together to provide you with the care you need, when you need it.  We recommend signing up for the patient portal called "MyChart".  Sign up information is provided on this After Visit Summary.  MyChart is used to connect with patients for Virtual Visits (Telemedicine).  Patients are able to view lab/test results, encounter notes, upcoming appointments, etc.  Non-urgent messages can be sent to your provider as well.   To learn more about what you can do with MyChart, go to [email or web address].    Your next appointment:  2 WEEKS WITH AFIB CLINIC POST CATH / URSUY     AS SCHEDULED   Provider:   [name], MD    Other Instructions

## 2023-03-31 LAB — BASIC METABOLIC PANEL
BUN/Creatinine Ratio: 16 (ref 12–28)
BUN: 18 mg/dL (ref 8–27)
CO2: 24 mmol/L (ref 20–29)
Calcium: 9.1 mg/dL (ref 8.7–10.3)
Chloride: 103 mmol/L (ref 96–106)
Creatinine, Ser: 1.16 mg/dL — ABNORMAL HIGH (ref 0.57–1.00)
Glucose: 84 mg/dL (ref 70–99)
Potassium: 4.5 mmol/L (ref 3.5–5.2)
Sodium: 144 mmol/L (ref 134–144)
eGFR: 50 mL/min/{1.73_m2} — ABNORMAL LOW (ref >59)

## 2023-03-31 LAB — CBC
Hematocrit: 35.5 % (ref 34.0–46.6)
Hemoglobin: 11.3 g/dL (ref 11.1–15.9)
MCH: 23.6 pg — ABNORMAL LOW (ref 26.6–33.0)
MCHC: 31.8 g/dL (ref 31.5–35.7)
MCV: 74 fL — ABNORMAL LOW (ref 79–97)
Platelets: 236 10*3/uL (ref 150–450)
RBC: 4.79 x10E6/uL (ref 3.77–5.28)
RDW: 16.1 % — ABNORMAL HIGH (ref 11.7–15.4)
WBC: 6.9 10*3/uL (ref 3.4–10.8)

## 2023-04-02 ENCOUNTER — Telehealth: Payer: Self-pay | Admitting: *Deleted

## 2023-04-02 MED ORDER — CLOPIDOGREL BISULFATE 75 MG PO TABS: ORAL_TABLET | ORAL | 0 refills | Status: DC

## 2023-04-02 NOTE — Telephone Encounter (Signed)
Per Dr Eldridge Dace -start Plavix prior to Coronary Stent Intervention 04/03/23. " Can give clopidogrel 300 mg PO x 1 followed by 75 mg daily."  Call placed to patient to discuss starting Plavix- voicemail full, unable to leave a message.

## 2023-04-02 NOTE — Telephone Encounter (Signed)
Call placed to patient to discuss starting Plavix, no answer, sent pt MyChart message requesting she call me to discuss.

## 2023-04-02 NOTE — Telephone Encounter (Signed)
Patient is aware of Dr Hoyle Barr recommendation to start Plavix 300 mg x 1 today, then 75 mg daily. Patient has been taking omeprazole 20 mg daily. I have asked patient to stop taking omeprazole because of potential interaction with Plavix and discuss alternative/ recommendation with Dr Eldridge Dace tomorrow.

## 2023-04-02 NOTE — Telephone Encounter (Signed)
Coronary Stent scheduled at [facility] for: Friday Apr 03, 2023 10:30 AM Arrival time [facility] Main Entrance A at: 8:30 AM  Nothing to eat after midnight prior to procedure, clear liquids until 5 AM day of procedure.  Medication instructions: -Hold:  Eliquis-pt reports last dose morning dose 03/31/23  Spironolactone-AM of procedure  Lasix -AM of procedure  Ozempic-weekly on Mondays-pt reports last dose 03/23/23 -Other usual morning medications can be taken with sips of water including aspirin 81 mg and Plavix 75 mg.  Confirmed patient has responsible adult to drive home post procedure and be with patient first 24 hours after arriving home.  Plan to go home the same day, you will only stay overnight if medically necessary.  Reviewed procedure instructions with patient.

## 2023-04-03 ENCOUNTER — Encounter (HOSPITAL_COMMUNITY): Payer: Self-pay | Admitting: Interventional Cardiology

## 2023-04-03 ENCOUNTER — Encounter (HOSPITAL_COMMUNITY)
Admission: RE | Disposition: A | Discharge status: Home / Self Care | Payer: Self-pay | Source: Home / Self Care | Attending: Interventional Cardiology

## 2023-04-03 ENCOUNTER — Other Ambulatory Visit: Payer: Self-pay

## 2023-04-03 ENCOUNTER — Ambulatory Visit (HOSPITAL_COMMUNITY)
Admission: RE | Admit: 2023-04-03 | Discharge: 2023-04-05 | Disposition: A | Discharge status: Home / Self Care | Payer: BC Managed Care – PPO | Attending: Interventional Cardiology | Admitting: Interventional Cardiology

## 2023-04-03 DIAGNOSIS — I2510 Atherosclerotic heart disease of native coronary artery without angina pectoris: Secondary | ICD-10-CM | POA: Diagnosis not present

## 2023-04-03 DIAGNOSIS — I25118 Atherosclerotic heart disease of native coronary artery with other forms of angina pectoris: Secondary | ICD-10-CM | POA: Insufficient documentation

## 2023-04-03 DIAGNOSIS — R0602 Shortness of breath: Secondary | ICD-10-CM

## 2023-04-03 DIAGNOSIS — I4819 Other persistent atrial fibrillation: Secondary | ICD-10-CM | POA: Diagnosis not present

## 2023-04-03 DIAGNOSIS — I2584 Coronary atherosclerosis due to calcified coronary lesion: Secondary | ICD-10-CM | POA: Diagnosis not present

## 2023-04-03 DIAGNOSIS — Z79899 Other long term (current) drug therapy: Secondary | ICD-10-CM | POA: Insufficient documentation

## 2023-04-03 DIAGNOSIS — G4733 Obstructive sleep apnea (adult) (pediatric): Secondary | ICD-10-CM | POA: Diagnosis not present

## 2023-04-03 DIAGNOSIS — Z8249 Family history of ischemic heart disease and other diseases of the circulatory system: Secondary | ICD-10-CM | POA: Diagnosis not present

## 2023-04-03 DIAGNOSIS — I10 Essential (primary) hypertension: Secondary | ICD-10-CM | POA: Diagnosis present

## 2023-04-03 DIAGNOSIS — I4891 Unspecified atrial fibrillation: Secondary | ICD-10-CM | POA: Diagnosis present

## 2023-04-03 DIAGNOSIS — I5030 Unspecified diastolic (congestive) heart failure: Secondary | ICD-10-CM | POA: Insufficient documentation

## 2023-04-03 DIAGNOSIS — I110 Hypertensive heart disease with heart failure: Secondary | ICD-10-CM | POA: Diagnosis not present

## 2023-04-03 DIAGNOSIS — D6869 Other thrombophilia: Secondary | ICD-10-CM | POA: Diagnosis not present

## 2023-04-03 DIAGNOSIS — Z7901 Long term (current) use of anticoagulants: Secondary | ICD-10-CM | POA: Diagnosis not present

## 2023-04-03 HISTORY — PX: CORONARY STENT INTERVENTION: CATH118234

## 2023-04-03 HISTORY — PX: CORONARY LITHOTRIPSY: CATH118330

## 2023-04-03 HISTORY — PX: CORONARY ULTRASOUND/IVUS: CATH118244

## 2023-04-03 LAB — POCT ACTIVATED CLOTTING TIME
Activated Clotting Time: 374 seconds
Activated Clotting Time: 282 seconds
Activated Clotting Time: 358 seconds
Activated Clotting Time: 401 seconds

## 2023-04-03 LAB — BASIC METABOLIC PANEL
Anion gap: 11 (ref 5–15)
BUN: 13 mg/dL (ref 8–23)
CO2: 30 mmol/L (ref 22–32)
Calcium: 8.9 mg/dL (ref 8.9–10.3)
Chloride: 100 mmol/L (ref 98–111)
Creatinine, Ser: 1.17 mg/dL — ABNORMAL HIGH (ref 0.44–1.00)
GFR, Estimated: 50 mL/min — ABNORMAL LOW (ref >60)
Glucose, Bld: 107 mg/dL — ABNORMAL HIGH (ref 70–99)
Potassium: 4 mmol/L (ref 3.5–5.1)
Sodium: 141 mmol/L (ref 135–145)

## 2023-04-03 LAB — GLUCOSE, CAPILLARY
Glucose-Capillary: 96 mg/dL (ref 70–99)
Glucose-Capillary: 92 mg/dL (ref 70–99)
Glucose-Capillary: 80 mg/dL (ref 70–99)
Glucose-Capillary: 69 mg/dL — ABNORMAL LOW (ref 70–99)
Glucose-Capillary: 141 mg/dL — ABNORMAL HIGH (ref 70–99)

## 2023-04-03 LAB — NO BLOOD PRODUCTS

## 2023-04-03 SURGERY — CORONARY STENT INTERVENTION
Anesthesia: LOCAL

## 2023-04-03 MED ORDER — FAMOTIDINE IN NACL 20-0.9 MG/50ML-% IV SOLN
INTRAVENOUS | Status: AC
  Filled 2023-04-03: qty 50

## 2023-04-03 MED ORDER — MIDAZOLAM HCL 2 MG/2ML IJ SOLN
INTRAMUSCULAR | Status: AC
  Filled 2023-04-03: qty 2

## 2023-04-03 MED ORDER — LIDOCAINE HCL (PF) 1 % IJ SOLN
INTRAMUSCULAR | Status: DC | PRN
  Administered 2023-04-03: 2 mL via INTRADERMAL

## 2023-04-03 MED ORDER — SODIUM CHLORIDE 0.9% FLUSH
3.0000 mL | Freq: Two times a day (BID) | INTRAVENOUS | Status: DC
  Administered 2023-04-04 – 2023-04-05 (×3): 3 mL via INTRAVENOUS

## 2023-04-03 MED ORDER — PANTOPRAZOLE SODIUM 40 MG PO TBEC
40.0000 mg | DELAYED_RELEASE_TABLET | Freq: Every day | ORAL | Status: DC
  Administered 2023-04-03 – 2023-04-05 (×3): 40 mg via ORAL
  Filled 2023-04-03 (×3): qty 1

## 2023-04-03 MED ORDER — NITROGLYCERIN 1 MG/10 ML FOR IR/CATH LAB
INTRA_ARTERIAL | Status: AC
  Filled 2023-04-03: qty 10

## 2023-04-03 MED ORDER — FENTANYL CITRATE (PF) 100 MCG/2ML IJ SOLN
INTRAMUSCULAR | Status: DC | PRN
  Administered 2023-04-03 (×3): 25 ug via INTRAVENOUS

## 2023-04-03 MED ORDER — SODIUM CHLORIDE 0.9% FLUSH
3.0000 mL | Freq: Two times a day (BID) | INTRAVENOUS | Status: DC
  Administered 2023-04-04: 3 mL via INTRAVENOUS

## 2023-04-03 MED ORDER — SODIUM CHLORIDE 0.9 % IV SOLN: 250.0000 mL | INTRAVENOUS | Status: DC | PRN

## 2023-04-03 MED ORDER — SPIRONOLACTONE 12.5 MG HALF TABLET
12.5000 mg | ORAL_TABLET | Freq: Every day | ORAL | Status: DC
  Administered 2023-04-03 – 2023-04-05 (×3): 12.5 mg via ORAL
  Filled 2023-04-03 (×3): qty 1

## 2023-04-03 MED ORDER — ACETAMINOPHEN 325 MG PO TABS: 650.0000 mg | ORAL_TABLET | ORAL | Status: DC | PRN

## 2023-04-03 MED ORDER — FAMOTIDINE IN NACL 20-0.9 MG/50ML-% IV SOLN
INTRAVENOUS | Status: AC | PRN
  Administered 2023-04-03: 20 mg via INTRAVENOUS

## 2023-04-03 MED ORDER — LIDOCAINE HCL (PF) 1 % IJ SOLN
INTRAMUSCULAR | Status: AC
  Filled 2023-04-03: qty 30

## 2023-04-03 MED ORDER — SODIUM CHLORIDE 0.9% FLUSH: 3.0000 mL | INTRAVENOUS | Status: DC | PRN

## 2023-04-03 MED ORDER — ONDANSETRON HCL 4 MG/2ML IJ SOLN: 4.0000 mg | Freq: Four times a day (QID) | INTRAMUSCULAR | Status: DC | PRN

## 2023-04-03 MED ORDER — HEPARIN SODIUM (PORCINE) 1000 UNIT/ML IJ SOLN
INTRAMUSCULAR | Status: AC
  Filled 2023-04-03: qty 10

## 2023-04-03 MED ORDER — DILTIAZEM HCL ER BEADS 240 MG PO CP24: 360.0000 mg | ORAL_CAPSULE | Freq: Every day | ORAL | Status: DC

## 2023-04-03 MED ORDER — METOPROLOL TARTRATE 25 MG PO TABS
25.0000 mg | ORAL_TABLET | Freq: Two times a day (BID) | ORAL | Status: DC
  Administered 2023-04-03: 25 mg via ORAL
  Filled 2023-04-03 (×2): qty 1

## 2023-04-03 MED ORDER — CLOPIDOGREL BISULFATE 300 MG PO TABS
ORAL_TABLET | ORAL | Status: AC
  Filled 2023-04-03: qty 1

## 2023-04-03 MED ORDER — VITAMIN D (ERGOCALCIFEROL) 1.25 MG (50000 UNIT) PO CAPS
50000.0000 [IU] | ORAL_CAPSULE | ORAL | Status: DC
  Administered 2023-04-03: 50000 [IU] via ORAL
  Filled 2023-04-03: qty 1

## 2023-04-03 MED ORDER — DILTIAZEM HCL ER COATED BEADS 180 MG PO CP24
360.0000 mg | ORAL_CAPSULE | Freq: Every day | ORAL | Status: DC
  Administered 2023-04-04 – 2023-04-05 (×2): 360 mg via ORAL
  Filled 2023-04-03 (×2): qty 2

## 2023-04-03 MED ORDER — HEPARIN (PORCINE) IN NACL 1000-0.9 UT/500ML-% IV SOLN
INTRAVENOUS | Status: DC | PRN
  Administered 2023-04-03 (×2): 500 mL

## 2023-04-03 MED ORDER — CLOPIDOGREL BISULFATE 75 MG PO TABS
75.0000 mg | ORAL_TABLET | Freq: Every day | ORAL | Status: DC
  Administered 2023-04-04 – 2023-04-05 (×2): 75 mg via ORAL
  Filled 2023-04-03 (×2): qty 1

## 2023-04-03 MED ORDER — CLOPIDOGREL BISULFATE 75 MG PO TABS: 75.0000 mg | ORAL_TABLET | Freq: Every day | ORAL | Status: DC

## 2023-04-03 MED ORDER — SEMAGLUTIDE (2 MG/DOSE) 8 MG/3ML ~~LOC~~ SOPN: 2.0000 mg | PEN_INJECTOR | SUBCUTANEOUS | Status: DC

## 2023-04-03 MED ORDER — SODIUM CHLORIDE 0.9 % WEIGHT BASED INFUSION
3.0000 mL/kg/h | INTRAVENOUS | Status: DC
  Administered 2023-04-03: 3 mL/kg/h via INTRAVENOUS

## 2023-04-03 MED ORDER — PREGABALIN 75 MG PO CAPS
150.0000 mg | ORAL_CAPSULE | Freq: Two times a day (BID) | ORAL | Status: DC
  Administered 2023-04-04 – 2023-04-05 (×3): 150 mg via ORAL
  Filled 2023-04-03 (×4): qty 2

## 2023-04-03 MED ORDER — CLONAZEPAM 0.5 MG PO TABS
0.5000 mg | ORAL_TABLET | Freq: Every day | ORAL | Status: DC
  Administered 2023-04-04 – 2023-04-05 (×2): 0.5 mg via ORAL
  Filled 2023-04-03 (×2): qty 1

## 2023-04-03 MED ORDER — ASPIRIN 81 MG PO CHEW: 81.0000 mg | CHEWABLE_TABLET | ORAL | Status: DC

## 2023-04-03 MED ORDER — VERAPAMIL HCL 2.5 MG/ML IV SOLN
INTRAVENOUS | Status: AC
  Filled 2023-04-03: qty 2

## 2023-04-03 MED ORDER — VERAPAMIL HCL 2.5 MG/ML IV SOLN
INTRAVENOUS | Status: DC | PRN
  Administered 2023-04-03: 10 mL via INTRA_ARTERIAL

## 2023-04-03 MED ORDER — NITROGLYCERIN 1 MG/10 ML FOR IR/CATH LAB
INTRA_ARTERIAL | Status: DC | PRN
  Administered 2023-04-03: 200 ug

## 2023-04-03 MED ORDER — CLOPIDOGREL BISULFATE 300 MG PO TABS
ORAL_TABLET | ORAL | Status: DC | PRN
  Administered 2023-04-03: 300 mg via ORAL

## 2023-04-03 MED ORDER — SODIUM CHLORIDE 0.9 % WEIGHT BASED INFUSION: 1.0000 mL/kg/h | INTRAVENOUS | Status: DC

## 2023-04-03 MED ORDER — FENTANYL CITRATE (PF) 100 MCG/2ML IJ SOLN
INTRAMUSCULAR | Status: AC
  Filled 2023-04-03: qty 2

## 2023-04-03 MED ORDER — HEPARIN SODIUM (PORCINE) 1000 UNIT/ML IJ SOLN
INTRAMUSCULAR | Status: DC | PRN
  Administered 2023-04-03: 9000 [IU] via INTRAVENOUS
  Administered 2023-04-03: 2000 [IU] via INTRAVENOUS

## 2023-04-03 MED ORDER — SODIUM CHLORIDE 0.9 % IV SOLN: INTRAVENOUS | Status: AC

## 2023-04-03 MED ORDER — ATORVASTATIN CALCIUM 40 MG PO TABS: 40.0000 mg | ORAL_TABLET | ORAL | Status: DC

## 2023-04-03 MED ORDER — LABETALOL HCL 5 MG/ML IV SOLN: 10.0000 mg | INTRAVENOUS | Status: AC | PRN

## 2023-04-03 MED ORDER — HYDRALAZINE HCL 20 MG/ML IJ SOLN: 10.0000 mg | INTRAMUSCULAR | Status: AC | PRN

## 2023-04-03 MED ORDER — MIDAZOLAM HCL 2 MG/2ML IJ SOLN
INTRAMUSCULAR | Status: DC | PRN
  Administered 2023-04-03 (×2): 1 mg via INTRAVENOUS
  Administered 2023-04-03: 2 mg via INTRAVENOUS

## 2023-04-03 SURGICAL SUPPLY — 23 items
BALL SAPPHIRE NC24 3.25X10 (BALLOONS) ×1
BALLN EMERGE MR 2.25X12 (BALLOONS) ×1
BALLOON EMERGE MR 2.25X12 (BALLOONS) IMPLANT
BALLOON SAPPHIRE NC24 3.25X10 (BALLOONS) IMPLANT
CATH INFINITI JR4 5F (CATHETERS) IMPLANT
CATH LAUNCHER 6FR EBU 3.75 (CATHETERS) IMPLANT
CATH OPTICROSS HD (CATHETERS) IMPLANT
CATH SHOCKWAVE C2 3.0X12 (CATHETERS) IMPLANT
DEVICE RAD COMP TR BAND LRG (VASCULAR PRODUCTS) IMPLANT
GLIDESHEATH SLEND SS 6F .021 (SHEATH) IMPLANT
GUIDEWIRE INQWIRE 1.5J.035X260 (WIRE) IMPLANT
INQWIRE 1.5J .035X260CM (WIRE) ×1
KIT ENCORE 26 ADVANTAGE (KITS) IMPLANT
KIT HEART LEFT (KITS) ×2 IMPLANT
KIT HEMO VALVE WATCHDOG (MISCELLANEOUS) IMPLANT
PACK CARDIAC CATHETERIZATION (CUSTOM PROCEDURE TRAY) ×2 IMPLANT
SLED PULL BACK IVUS (MISCELLANEOUS) IMPLANT
STENT SYNERGY XD 3.0X16 (Permanent Stent) IMPLANT
SYNERGY XD 3.0X16 (Permanent Stent) ×1 IMPLANT
TRANSDUCER W/STOPCOCK (MISCELLANEOUS) ×2 IMPLANT
TUBING CIL FLEX 10 FLL-RA (TUBING) ×2 IMPLANT
WIRE ASAHI GRAND SLAM 180CM (WIRE) IMPLANT
WIRE RUNTHROUGH .014X180CM (WIRE) IMPLANT

## 2023-04-03 NOTE — Interval H&P Note (Signed)
Cath Lab Visit (complete for each Cath Lab visit)  Clinical Evaluation Leading to the Procedure:   ACS: No.  Non-ACS:    Anginal Classification: CCS II  Anti-ischemic medical therapy: Maximal Therapy (2 or more classes of medications)  Non-Invasive Test Results: No non-invasive testing performed  Prior CABG: No previous CABG      History and Physical Interval Note:  04/03/2023 12:12 PM  Kayla Hendrix  has presented today for surgery, with the diagnosis of cad.  The various methods of treatment have been discussed with the patient and family. After consideration of risks, benefits and other options for treatment, the patient has consented to  Procedure(s): CORONARY STENT INTERVENTION (N/A) as a surgical intervention.  The patient's history has been reviewed, patient examined, no change in status, stable for surgery.  I have reviewed the patient's chart and labs.  Questions were answered to the patient's satisfaction.     Lance Muss

## 2023-04-04 DIAGNOSIS — Z7901 Long term (current) use of anticoagulants: Secondary | ICD-10-CM | POA: Diagnosis not present

## 2023-04-04 DIAGNOSIS — I25118 Atherosclerotic heart disease of native coronary artery with other forms of angina pectoris: Secondary | ICD-10-CM | POA: Diagnosis not present

## 2023-04-04 DIAGNOSIS — G4733 Obstructive sleep apnea (adult) (pediatric): Secondary | ICD-10-CM | POA: Diagnosis not present

## 2023-04-04 DIAGNOSIS — I480 Paroxysmal atrial fibrillation: Secondary | ICD-10-CM | POA: Diagnosis not present

## 2023-04-04 DIAGNOSIS — Z79899 Other long term (current) drug therapy: Secondary | ICD-10-CM | POA: Diagnosis not present

## 2023-04-04 DIAGNOSIS — I2584 Coronary atherosclerosis due to calcified coronary lesion: Secondary | ICD-10-CM | POA: Diagnosis not present

## 2023-04-04 DIAGNOSIS — Z95820 Peripheral vascular angioplasty status with implants and grafts: Secondary | ICD-10-CM

## 2023-04-04 DIAGNOSIS — I110 Hypertensive heart disease with heart failure: Secondary | ICD-10-CM | POA: Diagnosis not present

## 2023-04-04 DIAGNOSIS — I2510 Atherosclerotic heart disease of native coronary artery without angina pectoris: Secondary | ICD-10-CM | POA: Diagnosis not present

## 2023-04-04 DIAGNOSIS — Z8249 Family history of ischemic heart disease and other diseases of the circulatory system: Secondary | ICD-10-CM | POA: Diagnosis not present

## 2023-04-04 DIAGNOSIS — I4819 Other persistent atrial fibrillation: Secondary | ICD-10-CM | POA: Diagnosis not present

## 2023-04-04 DIAGNOSIS — D6869 Other thrombophilia: Secondary | ICD-10-CM | POA: Diagnosis not present

## 2023-04-04 DIAGNOSIS — I5030 Unspecified diastolic (congestive) heart failure: Secondary | ICD-10-CM | POA: Diagnosis not present

## 2023-04-04 DIAGNOSIS — I209 Angina pectoris, unspecified: Secondary | ICD-10-CM | POA: Diagnosis not present

## 2023-04-04 LAB — GLUCOSE, CAPILLARY
Glucose-Capillary: 111 mg/dL — ABNORMAL HIGH (ref 70–99)
Glucose-Capillary: 90 mg/dL (ref 70–99)

## 2023-04-04 LAB — BASIC METABOLIC PANEL
Anion gap: 8 (ref 5–15)
BUN: 11 mg/dL (ref 8–23)
CO2: 26 mmol/L (ref 22–32)
Calcium: 8.3 mg/dL — ABNORMAL LOW (ref 8.9–10.3)
Chloride: 105 mmol/L (ref 98–111)
Creatinine, Ser: 1.07 mg/dL — ABNORMAL HIGH (ref 0.44–1.00)
GFR, Estimated: 56 mL/min — ABNORMAL LOW (ref >60)
Glucose, Bld: 99 mg/dL (ref 70–99)
Potassium: 3.9 mmol/L (ref 3.5–5.1)
Sodium: 139 mmol/L (ref 135–145)

## 2023-04-04 LAB — CBC
HCT: 31.7 % — ABNORMAL LOW (ref 36.0–46.0)
Hemoglobin: 9.8 g/dL — ABNORMAL LOW (ref 12.0–15.0)
MCH: 22.7 pg — ABNORMAL LOW (ref 26.0–34.0)
MCHC: 30.9 g/dL (ref 30.0–36.0)
MCV: 73.5 fL — ABNORMAL LOW (ref 80.0–100.0)
Platelets: 184 10*3/uL (ref 150–400)
RBC: 4.31 MIL/uL (ref 3.87–5.11)
RDW: 16.5 % — ABNORMAL HIGH (ref 11.5–15.5)
WBC: 5.7 10*3/uL (ref 4.0–10.5)
nRBC: 0 % (ref 0.0–0.2)

## 2023-04-04 MED ORDER — METOPROLOL TARTRATE 50 MG PO TABS
50.0000 mg | ORAL_TABLET | Freq: Two times a day (BID) | ORAL | Status: DC
  Administered 2023-04-04 – 2023-04-05 (×3): 50 mg via ORAL
  Filled 2023-04-04 (×3): qty 1
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
## 2023-04-04 NOTE — Progress Notes (Signed)
CARDIAC REHAB PHASE I   Pt resting in bed. Pt in uncontrolled Afib rate in high 130s.   Pt educated on Cardiac Rehab Phase 2, risk factors, exercise guidelines, post cath restrictions & site care, and nutrition. Pt not motivated to exercise, multiple excuses. Pt understands education. Asking to go home, pt educated that MD will need to see her and clear her for discharge.    [id number] Essie Hart, RN, BSN 04/04/2023 11:02 AM

## 2023-04-04 NOTE — Progress Notes (Addendum)
Subjective:  No angina post PCI Rapid afib over night and now  Objective:  Vitals:   04/03/23 1625 04/03/23 2004 04/04/23 0029 04/04/23 0355  BP: 114/61 112/81 110/73 113/72  Pulse:  97 96 (!) 103  Resp: 16 15 18 17   Temp:  (!) 97.1 F (36.2 C) 98 F (36.7 C) 97.8 F (36.6 C)  TempSrc:  Oral Oral Oral  SpO2: 95% 98% 94% 94%  Weight:      Height:        Intake/Output from previous day:  Intake/Output Summary (Last 24 hours) at 04/04/2023 0756 Last data filed at 04/03/2023 1813 Gross per 24 hour  Intake 237.9 ml  Output --  Net 237.9 ml    Physical Exam: Affect appropriate Healthy:  appears stated age HEENT: normal Neck supple with no adenopathy JVP normal no bruits no thyromegaly Lungs clear with no wheezing and good diaphragmatic motion Heart:  S1/S2 no murmur, no rub, gallop or click PMI normal Abdomen: benighn, BS positve, no tenderness, no AAA no bruit.  No HSM or HJR Distal pulses intact with no bruits No edema Neuro non-focal Skin warm and dry No muscular weakness Right radial A   Lab Results: Basic Metabolic Panel: Recent Labs    04/03/23 0933 04/04/23 0150  NA 141 139  K 4.0 3.9  CL 100 105  CO2 30 26  GLUCOSE 107* 99  BUN 13 11  CREATININE 1.17* 1.07*  CALCIUM 8.9 8.3*   Liver Function Tests: No results for input(s): "AST", "ALT", "ALKPHOS", "BILITOT", "PROT", "ALBUMIN" in the last 72 hours. No results for input(s): "LIPASE", "AMYLASE" in the last 72 hours. CBC: Recent Labs    04/04/23 0150  WBC 5.7  HGB 9.8*  HCT 31.7*  MCV 73.5*  PLT 184    Imaging: CARDIAC CATHETERIZATION  Result Date: 04/03/2023   Prox RCA to Mid RCA lesion is 30% stenosed.   Mid LAD lesion is 80% stenosed.   After shockwave intravascular lithotripsy, a drug-eluting stent was successfully placed using a SYNERGY XD 3.0X16 postdilated to greater than 3.25 mm.  It was optimized with intravascular ultrasound.   Post intervention, there is a 5% residual  stenosis. Successful intravascular lithotripsy of the mid LAD.  Continue clopidogrel.  Restart Eliquis tomorrow.  Will hold off on aspirin to minimize bleeding risk. Results conveyed to the son.    Cardiac Studies:  ECG:    Telemetry:  afib rates 110-130   Echo: EF 60-65% trivial MR   Medications:    [START ON 04/06/2023] atorvastatin  40 mg Oral Once per day on Mon Thu   clonazePAM  0.5 mg Oral Daily   clopidogrel  75 mg Oral Daily   diltiazem  360 mg Oral Daily   metoprolol tartrate  25 mg Oral BID   pantoprazole  40 mg Oral Daily   pregabalin  150 mg Oral BID   Semaglutide (2 MG/DOSE)  2 mg Subcutaneous Weekly   sodium chloride flush  3 mL Intravenous Q12H   sodium chloride flush  3 mL Intravenous Q12H   spironolactone  12.5 mg Oral Daily   Vitamin D (Ergocalciferol)  50,000 Units Oral Q7 days      sodium chloride      Assessment/Plan:   CAD:  Post shockwave lithotripsy and DES to mid LAD On plavix no ASA with afib and need for anticoagulaiton  Afib:  prior ablation x2 and convergent procedure April with [facility] Resume eliquis continue cardizem and increase lopressor  to 50 mg bid Patient indicates BP gets low if dose too high She has had LAA clipping Allergy noted to oral amiodarone with diarrhea and nausea  OSA:  CPAP  Charlton Haws 04/04/2023, 7:56 AM

## 2023-04-05 ENCOUNTER — Other Ambulatory Visit: Payer: Self-pay | Admitting: Physician Assistant

## 2023-04-05 DIAGNOSIS — Z7901 Long term (current) use of anticoagulants: Secondary | ICD-10-CM | POA: Diagnosis not present

## 2023-04-05 DIAGNOSIS — D6869 Other thrombophilia: Secondary | ICD-10-CM | POA: Diagnosis not present

## 2023-04-05 DIAGNOSIS — G4733 Obstructive sleep apnea (adult) (pediatric): Secondary | ICD-10-CM | POA: Diagnosis not present

## 2023-04-05 DIAGNOSIS — I110 Hypertensive heart disease with heart failure: Secondary | ICD-10-CM | POA: Diagnosis not present

## 2023-04-05 DIAGNOSIS — I5032 Chronic diastolic (congestive) heart failure: Secondary | ICD-10-CM

## 2023-04-05 DIAGNOSIS — I209 Angina pectoris, unspecified: Secondary | ICD-10-CM | POA: Diagnosis not present

## 2023-04-05 DIAGNOSIS — I2584 Coronary atherosclerosis due to calcified coronary lesion: Secondary | ICD-10-CM | POA: Diagnosis not present

## 2023-04-05 DIAGNOSIS — I480 Paroxysmal atrial fibrillation: Secondary | ICD-10-CM | POA: Diagnosis not present

## 2023-04-05 DIAGNOSIS — I2510 Atherosclerotic heart disease of native coronary artery without angina pectoris: Secondary | ICD-10-CM | POA: Diagnosis not present

## 2023-04-05 DIAGNOSIS — I5030 Unspecified diastolic (congestive) heart failure: Secondary | ICD-10-CM | POA: Diagnosis not present

## 2023-04-05 DIAGNOSIS — Z8249 Family history of ischemic heart disease and other diseases of the circulatory system: Secondary | ICD-10-CM | POA: Diagnosis not present

## 2023-04-05 DIAGNOSIS — Z79899 Other long term (current) drug therapy: Secondary | ICD-10-CM | POA: Diagnosis not present

## 2023-04-05 DIAGNOSIS — I4819 Other persistent atrial fibrillation: Secondary | ICD-10-CM | POA: Diagnosis not present

## 2023-04-05 DIAGNOSIS — I25118 Atherosclerotic heart disease of native coronary artery with other forms of angina pectoris: Secondary | ICD-10-CM | POA: Diagnosis not present

## 2023-04-05 MED ORDER — PANTOPRAZOLE SODIUM 40 MG PO TBEC: 40.0000 mg | DELAYED_RELEASE_TABLET | Freq: Every day | ORAL | 11 refills | Status: AC

## 2023-04-05 MED ORDER — CLOPIDOGREL BISULFATE 75 MG PO TABS: 75.0000 mg | ORAL_TABLET | Freq: Every day | ORAL | 3 refills | Status: DC

## 2023-04-05 MED ORDER — METOPROLOL TARTRATE 50 MG PO TABS: 25.0000 mg | ORAL_TABLET | Freq: Two times a day (BID) | ORAL | Status: DC

## 2023-04-05 NOTE — Progress Notes (Signed)
Subjective:  No angina post PCI Chronic poor afib rate control Patient refused higher dose lopressor   Objective:  Vitals:   04/04/23 0942 04/04/23 1152 04/04/23 1959 04/05/23 0504  BP: 114/71 107/61 112/66 115/65  Pulse: (!) 133 (!) 110 (!) 115 80  Resp:  17 16 19   Temp:  97.9 F (36.6 C) 98.1 F (36.7 C) 97.8 F (36.6 C)  TempSrc:  Oral Oral Oral  SpO2:  91% 95% (!) 89%  Weight:      Height:        Intake/Output from previous day: No intake or output data in the 24 hours ending 04/05/23 9604   Physical Exam: Affect appropriate Healthy:  appears stated age HEENT: normal Neck supple with no adenopathy JVP normal no bruits no thyromegaly Lungs clear with no wheezing and good diaphragmatic motion Heart:  S1/S2 no murmur, no rub, gallop or click PMI normal Abdomen: benighn, BS positve, no tenderness, no AAA no bruit.  No HSM or HJR Distal pulses intact with no bruits No edema Neuro non-focal Skin warm and dry No muscular weakness Right radial with mild bruising   Lab Results: Basic Metabolic Panel: Recent Labs    04/03/23 0933 04/04/23 0150  NA 141 139  K 4.0 3.9  CL 100 105  CO2 30 26  GLUCOSE 107* 99  BUN 13 11  CREATININE 1.17* 1.07*  CALCIUM 8.9 8.3*   Liver Function Tests: No results for input(s): "AST", "ALT", "ALKPHOS", "BILITOT", "PROT", "ALBUMIN" in the last 72 hours. No results for input(s): "LIPASE", "AMYLASE" in the last 72 hours. CBC: Recent Labs    04/04/23 0150  WBC 5.7  HGB 9.8*  HCT 31.7*  MCV 73.5*  PLT 184    Imaging: CARDIAC CATHETERIZATION  Result Date: 04/03/2023   Prox RCA to Mid RCA lesion is 30% stenosed.   Mid LAD lesion is 80% stenosed.   After shockwave intravascular lithotripsy, a drug-eluting stent was successfully placed using a SYNERGY XD 3.0X16 postdilated to greater than 3.25 mm.  It was optimized with intravascular ultrasound.   Post intervention, there is a 5% residual stenosis. Successful intravascular  lithotripsy of the mid LAD.  Continue clopidogrel.  Restart Eliquis tomorrow.  Will hold off on aspirin to minimize bleeding risk. Results conveyed to the son.    Cardiac Studies:  ECG:    Telemetry:  afib rates 110-130   Echo: EF 60-65% trivial MR   Medications:    [START ON 04/06/2023] atorvastatin  40 mg Oral Once per day on Mon Thu   clonazePAM  0.5 mg Oral Daily   clopidogrel  75 mg Oral Daily   diltiazem  360 mg Oral Daily   metoprolol tartrate  50 mg Oral BID   pantoprazole  40 mg Oral Daily   pregabalin  150 mg Oral BID   Semaglutide (2 MG/DOSE)  2 mg Subcutaneous Weekly   sodium chloride flush  3 mL Intravenous Q12H   sodium chloride flush  3 mL Intravenous Q12H   spironolactone  12.5 mg Oral Daily   Vitamin D (Ergocalciferol)  50,000 Units Oral Q7 days      sodium chloride      Assessment/Plan:   CAD:  Post shockwave lithotripsy and DES to mid LAD On plavix no ASA with afib and need for anticoagulaiton  Afib:  prior ablation x2 and convergent procedure April with [facility] Resume eliquis continue cardizem and increase lopressor to 50 mg bid Patient indicates BP gets low if  dose too high She has had LAA clipping Allergy noted to oral amiodarone with diarrhea and nausea Patient refused higher dose lopressor F/U Dr Lalla Brothers Resume eliquis on d/c today OSA:  CPAP  D/c home  F/U [facility] 04/05/2023, 8:21 AM

## 2023-04-05 NOTE — Discharge Instructions (Addendum)
Please go to the lab at [facility] [address] on Friday 04/10/23 at 11 am for labs (BMET)  Restart your Eliquis today. Stop taking Aspirin. Do not miss a dose of Clopidogrel (Plavix). Call our office as soon as possible if you cannot get it. Stop taking Omeprazole (Prilosec). It can interfere with the way Clopidogrel works. Start taking Pantoprazole (Protonix) instead of Omeprazole (Prilosec).
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
## 2023-04-07 ENCOUNTER — Telehealth (HOSPITAL_COMMUNITY): Payer: Self-pay | Admitting: *Deleted

## 2023-04-07 ENCOUNTER — Encounter (HOSPITAL_COMMUNITY): Payer: Self-pay | Admitting: Interventional Cardiology

## 2023-04-07 NOTE — Telephone Encounter (Signed)
Pt prefers to wait and see Dr Elberta Fortis mid-June

## 2023-04-07 NOTE — Telephone Encounter (Signed)
-----   Message from Alcide Clever sent at 04/07/2023 10:28 AM EDT ----- Regarding: RE: appt Called and wasn't able to leave a message, called her son as well and still wasn't able to leave a message on his phone either. ----- Message ----- From: Shona Simpson, RN Sent: 04/07/2023   8:45 AM EDT To: Alcide Clever Subject: appt                                           Pt needs appt to discuss possible cardioversion per renee - later this week if can (usually sees ricky) thanks Wilson Dusenbery

## 2023-04-08 LAB — LIPOPROTEIN A (LPA): Lipoprotein (a): 57.3 nmol/L — ABNORMAL HIGH (ref <75.0)

## 2023-04-10 ENCOUNTER — Ambulatory Visit: Payer: BC Managed Care – PPO | Attending: Physician Assistant

## 2023-04-10 DIAGNOSIS — I5032 Chronic diastolic (congestive) heart failure: Secondary | ICD-10-CM | POA: Diagnosis not present

## 2023-04-11 LAB — BASIC METABOLIC PANEL
BUN/Creatinine Ratio: 14 (ref 12–28)
BUN: 19 mg/dL (ref 8–27)
CO2: 25 mmol/L (ref 20–29)
Calcium: 9.1 mg/dL (ref 8.7–10.3)
Chloride: 98 mmol/L (ref 96–106)
Creatinine, Ser: 1.35 mg/dL — ABNORMAL HIGH (ref 0.57–1.00)
Glucose: 64 mg/dL — ABNORMAL LOW (ref 70–99)
Potassium: 4.2 mmol/L (ref 3.5–5.2)
Sodium: 141 mmol/L (ref 134–144)
eGFR: 42 mL/min/{1.73_m2} — ABNORMAL LOW (ref >59)

## 2023-04-13 ENCOUNTER — Telehealth: Payer: Self-pay | Admitting: Physician Assistant

## 2023-04-13 DIAGNOSIS — Z79899 Other long term (current) drug therapy: Secondary | ICD-10-CM

## 2023-04-13 DIAGNOSIS — I10 Essential (primary) hypertension: Secondary | ICD-10-CM

## 2023-04-13 MED ORDER — FUROSEMIDE 40 MG PO TABS: 40.0000 mg | ORAL_TABLET | Freq: Every day | ORAL | Status: DC

## 2023-04-13 NOTE — Telephone Encounter (Signed)
Pt returning call in regards to lab results.  

## 2023-04-13 NOTE — Telephone Encounter (Signed)
Returned call to patient. Discussed lab results and recommendations per Tereso Newcomer, PA-C: PLAN: -Decrease Lasix to 40 mg daily -BMET 1 week  BMET ordered, lab appt scheduled for 04/21/23.  Patient verbalized understanding.

## 2023-04-21 ENCOUNTER — Ambulatory Visit: Payer: BC Managed Care – PPO | Attending: Physician Assistant

## 2023-04-21 DIAGNOSIS — Z79899 Other long term (current) drug therapy: Secondary | ICD-10-CM

## 2023-04-21 DIAGNOSIS — I10 Essential (primary) hypertension: Secondary | ICD-10-CM

## 2023-04-22 LAB — BASIC METABOLIC PANEL
BUN/Creatinine Ratio: 15 (ref 12–28)
BUN: 19 mg/dL (ref 8–27)
CO2: 28 mmol/L (ref 20–29)
Calcium: 9 mg/dL (ref 8.7–10.3)
Chloride: 100 mmol/L (ref 96–106)
Creatinine, Ser: 1.31 mg/dL — ABNORMAL HIGH (ref 0.57–1.00)
Glucose: 100 mg/dL — ABNORMAL HIGH (ref 70–99)
Potassium: 3.7 mmol/L (ref 3.5–5.2)
Sodium: 143 mmol/L (ref 134–144)
eGFR: 44 mL/min/{1.73_m2} — ABNORMAL LOW (ref >59)

## 2023-05-01 ENCOUNTER — Encounter: Payer: Self-pay | Admitting: Cardiology

## 2023-05-01 ENCOUNTER — Ambulatory Visit: Payer: BC Managed Care – PPO | Attending: Cardiology | Admitting: Cardiology

## 2023-05-01 ENCOUNTER — Other Ambulatory Visit (HOSPITAL_COMMUNITY): Payer: Self-pay

## 2023-05-01 VITALS — BP 120/68 | HR 114 | Ht 66.0 in | Wt 209.0 lb

## 2023-05-01 DIAGNOSIS — I2510 Atherosclerotic heart disease of native coronary artery without angina pectoris: Secondary | ICD-10-CM | POA: Diagnosis not present

## 2023-05-01 DIAGNOSIS — I10 Essential (primary) hypertension: Secondary | ICD-10-CM | POA: Diagnosis not present

## 2023-05-01 DIAGNOSIS — I4819 Other persistent atrial fibrillation: Secondary | ICD-10-CM

## 2023-05-01 DIAGNOSIS — D6869 Other thrombophilia: Secondary | ICD-10-CM

## 2023-05-01 NOTE — Progress Notes (Signed)
  Electrophysiology Office Note:   Date:  05/01/2023  ID:  Kayla Hendrix, DOB [birth year], MRN [id number]  Primary Cardiologist: None Electrophysiologist: Sirron Francesconi Jorja Loa, MD      History of Present Illness:   Kayla Hendrix is a 72 y.o. female with h/o coronary artery disease, atrial fibrillation seen today for routine electrophysiology followup.  Since last being seen in our clinic the patient reports feeling shortness of breath and fatigue.  She has no chest pain.  She finds it difficult to do her daily activities due to her level of fatigue.  She is at this point questioning whether or not she wants to move forward with her ablation.  she denies chest pain, palpitations, dyspnea, PND, orthopnea, nausea, vomiting, dizziness, syncope, edema, weight gain, or early satiety.     She has a history of atrial fibrillation.  She has had ablation x 2, most recently 01/09/2022.  Despite this, she continued to have episodes of atrial fibrillation and is now post surgical convergent ablation 11/17/2022.  She was found to have coronary artery disease and is now status post LAD stent.  She has plans for catheter ablation 07/03/2023.     Review of systems complete and found to be negative unless listed in HPI.   Studies Reviewed:    EKG is ordered today. Personal review as below.  EKG Interpretation  Date/Time:  Friday May 01 2023 16:09:50 EDT Ventricular Rate:  103 PR Interval:    QRS Duration: 102 QT Interval:  368 QTC Calculation: 482 R Axis:   -37 Text Interpretation: Atrial fibrillation with rapid ventricular response Left axis deviation Low voltage QRS Incomplete right bundle branch block Nonspecific T wave abnormality No significant change since last tracing Confirmed by Aleicia Kenagy ([id number]) on 05/01/2023 4:12:50 PM    Risk Assessment/Calculations:    CHA2DS2-VASc Score = 5   This indicates a 7.2% annual risk of stroke. The patient's score is based upon: CHF History: 0 HTN History:  1 Diabetes History: 1 Stroke History: 0 Vascular Disease History: 1 Age Score: 1 Gender Score: 1             Physical Exam:   VS:  BP 120/68   Pulse (!) 114   Ht 5\' 6"  (1.676 m)   Wt 209 lb (94.8 kg)   SpO2 96%   BMI 33.73 kg/m    Wt Readings from Last 3 Encounters:  05/01/23 209 lb (94.8 kg)  04/03/23 221 lb 12.5 oz (100.6 kg)  03/30/23 210 lb 9.6 oz (95.5 kg)     GEN: Well nourished, well developed in no acute distress NECK: No JVD; No carotid bruits CARDIAC: Irregularly irregular rate and rhythm, no murmurs, rubs, gallops RESPIRATORY:  Clear to auscultation without rales, wheezing or rhonchi  ABDOMEN: Soft, non-tender, non-distended EXTREMITIES:  No edema; No deformity   ASSESSMENT AND PLAN:    1.  Persistent atrial fibrillation: Currently on Eliquis.  Is post convergent ablation in January 2024.  Has plans for catheter ablation 07/03/2023.  All questions were answered today.  2.  Coronary disease: Status post LAD PCI.  Plan per primary cardiology.  3.  Hypertension: Currently well-controlled  4.  Secondary hypercoagulable state: Currently on Eliquis for atrial fibrillation  Follow up with Dr. Elberta Fortis as usual post procedure  Signed, Jaice Digioia Jorja Loa, MD

## 2023-05-06 ENCOUNTER — Other Ambulatory Visit: Payer: Self-pay | Admitting: *Deleted

## 2023-05-06 DIAGNOSIS — E7800 Pure hypercholesterolemia, unspecified: Secondary | ICD-10-CM

## 2023-05-06 NOTE — Progress Notes (Signed)
Lipid clinic referral 

## 2023-05-13 ENCOUNTER — Other Ambulatory Visit: Payer: Self-pay

## 2023-05-13 DIAGNOSIS — I4819 Other persistent atrial fibrillation: Secondary | ICD-10-CM

## 2023-05-18 DIAGNOSIS — I87331 Chronic venous hypertension (idiopathic) with ulcer and inflammation of right lower extremity: Secondary | ICD-10-CM | POA: Diagnosis not present

## 2023-06-02 DIAGNOSIS — E1122 Type 2 diabetes mellitus with diabetic chronic kidney disease: Secondary | ICD-10-CM | POA: Diagnosis not present

## 2023-06-02 DIAGNOSIS — I1310 Hypertensive heart and chronic kidney disease without heart failure, with stage 1 through stage 4 chronic kidney disease, or unspecified chronic kidney disease: Secondary | ICD-10-CM | POA: Diagnosis not present

## 2023-06-16 ENCOUNTER — Ambulatory Visit: Payer: BC Managed Care – PPO | Attending: Cardiology

## 2023-06-16 DIAGNOSIS — I4819 Other persistent atrial fibrillation: Secondary | ICD-10-CM

## 2023-06-24 ENCOUNTER — Other Ambulatory Visit (HOSPITAL_COMMUNITY): Payer: BC Managed Care – PPO

## 2023-06-24 ENCOUNTER — Ambulatory Visit (HOSPITAL_COMMUNITY)
Admission: RE | Admit: 2023-06-24 | Discharge: 2023-06-24 | Disposition: A | Discharge status: Home / Self Care | Payer: BC Managed Care – PPO | Source: Ambulatory Visit | Attending: Cardiology | Admitting: Cardiology

## 2023-06-24 DIAGNOSIS — I4819 Other persistent atrial fibrillation: Secondary | ICD-10-CM | POA: Diagnosis not present

## 2023-06-24 MED ORDER — IOHEXOL 350 MG/ML SOLN
95.0000 mL | Freq: Once | INTRAVENOUS | Status: AC | PRN
  Administered 2023-06-24: 95 mL via INTRAVENOUS

## 2023-06-25 ENCOUNTER — Encounter: Payer: Self-pay | Admitting: Cardiology

## 2023-07-02 NOTE — Pre-Procedure Instructions (Signed)
Attempted to call patient regarding procedure instructions.  Left voicemail on the following.  Instructed patient on the following items: Arrival time 0800 Nothing to eat or drink after midnight No meds AM of procedure Responsible person to drive you home and stay with you for 24 hrs  Have you missed any doses of anti-coagulant- Eliquis- should be taken twice a day, if you have missed any days please let us know right away.  Don't take in the morning.

## 2023-07-03 ENCOUNTER — Ambulatory Visit (HOSPITAL_COMMUNITY): Payer: BC Managed Care – PPO | Admitting: Anesthesiology

## 2023-07-03 ENCOUNTER — Other Ambulatory Visit: Payer: Self-pay

## 2023-07-03 ENCOUNTER — Encounter (HOSPITAL_COMMUNITY)
Admission: RE | Disposition: A | Discharge status: Home / Self Care | Payer: Self-pay | Source: Ambulatory Visit | Attending: Cardiology

## 2023-07-03 ENCOUNTER — Ambulatory Visit (HOSPITAL_COMMUNITY)
Admission: RE | Admit: 2023-07-03 | Discharge: 2023-07-03 | Disposition: A | Discharge status: Home / Self Care | Payer: BC Managed Care – PPO | Source: Ambulatory Visit | Attending: Cardiology | Admitting: Cardiology

## 2023-07-03 DIAGNOSIS — I2510 Atherosclerotic heart disease of native coronary artery without angina pectoris: Secondary | ICD-10-CM | POA: Diagnosis not present

## 2023-07-03 DIAGNOSIS — I483 Typical atrial flutter: Secondary | ICD-10-CM | POA: Diagnosis not present

## 2023-07-03 DIAGNOSIS — I4819 Other persistent atrial fibrillation: Secondary | ICD-10-CM | POA: Diagnosis not present

## 2023-07-03 DIAGNOSIS — I4891 Unspecified atrial fibrillation: Secondary | ICD-10-CM | POA: Diagnosis not present

## 2023-07-03 DIAGNOSIS — I10 Essential (primary) hypertension: Secondary | ICD-10-CM | POA: Diagnosis not present

## 2023-07-03 DIAGNOSIS — R0602 Shortness of breath: Secondary | ICD-10-CM

## 2023-07-03 HISTORY — PX: ATRIAL FIBRILLATION ABLATION: EP1191

## 2023-07-03 LAB — GLUCOSE, CAPILLARY
Glucose-Capillary: 132 mg/dL — ABNORMAL HIGH (ref 70–99)
Glucose-Capillary: 130 mg/dL — ABNORMAL HIGH (ref 70–99)

## 2023-07-03 SURGERY — ATRIAL FIBRILLATION ABLATION
Anesthesia: General

## 2023-07-03 MED ORDER — SODIUM CHLORIDE 0.9% FLUSH: 3.0000 mL | INTRAVENOUS | Status: DC | PRN

## 2023-07-03 MED ORDER — PHENYLEPHRINE 80 MCG/ML (10ML) SYRINGE FOR IV PUSH (FOR BLOOD PRESSURE SUPPORT)
PREFILLED_SYRINGE | INTRAVENOUS | Status: DC | PRN
  Administered 2023-07-03 (×5): 80 ug via INTRAVENOUS

## 2023-07-03 MED ORDER — FENTANYL CITRATE (PF) 250 MCG/5ML IJ SOLN
INTRAMUSCULAR | Status: DC | PRN
  Administered 2023-07-03 (×2): 50 ug via INTRAVENOUS

## 2023-07-03 MED ORDER — HEPARIN SODIUM (PORCINE) 1000 UNIT/ML IJ SOLN
INTRAMUSCULAR | Status: DC | PRN
  Administered 2023-07-03: 14000 [IU] via INTRAVENOUS

## 2023-07-03 MED ORDER — SODIUM CHLORIDE 0.9% FLUSH: 3.0000 mL | Freq: Two times a day (BID) | INTRAVENOUS | Status: DC

## 2023-07-03 MED ORDER — HEPARIN (PORCINE) IN NACL 1000-0.9 UT/500ML-% IV SOLN
INTRAVENOUS | Status: DC | PRN
  Administered 2023-07-03 (×4): 500 mL

## 2023-07-03 MED ORDER — PROTAMINE SULFATE 10 MG/ML IV SOLN
INTRAVENOUS | Status: DC | PRN
  Administered 2023-07-03: 40 mg via INTRAVENOUS

## 2023-07-03 MED ORDER — LIDOCAINE 2% (20 MG/ML) 5 ML SYRINGE
INTRAMUSCULAR | Status: DC | PRN
  Administered 2023-07-03: 60 mg via INTRAVENOUS

## 2023-07-03 MED ORDER — ONDANSETRON HCL 4 MG/2ML IJ SOLN
INTRAMUSCULAR | Status: DC | PRN
  Administered 2023-07-03: 4 mg via INTRAVENOUS

## 2023-07-03 MED ORDER — SUGAMMADEX SODIUM 200 MG/2ML IV SOLN
INTRAVENOUS | Status: DC | PRN
  Administered 2023-07-03: 200 mg via INTRAVENOUS

## 2023-07-03 MED ORDER — SODIUM CHLORIDE 0.9 % IV SOLN: INTRAVENOUS | Status: DC

## 2023-07-03 MED ORDER — DEXAMETHASONE SODIUM PHOSPHATE 10 MG/ML IJ SOLN
INTRAMUSCULAR | Status: DC | PRN
  Administered 2023-07-03: 10 mg via INTRAVENOUS

## 2023-07-03 MED ORDER — FENTANYL CITRATE (PF) 100 MCG/2ML IJ SOLN: 25.0000 ug | INTRAMUSCULAR | Status: DC | PRN

## 2023-07-03 MED ORDER — SODIUM CHLORIDE 0.9 % IV SOLN: 250.0000 mL | INTRAVENOUS | Status: DC | PRN

## 2023-07-03 MED ORDER — PHENYLEPHRINE HCL-NACL 20-0.9 MG/250ML-% IV SOLN
INTRAVENOUS | Status: DC | PRN
  Administered 2023-07-03: 25 ug/min via INTRAVENOUS

## 2023-07-03 MED ORDER — PROPOFOL 10 MG/ML IV BOLUS
INTRAVENOUS | Status: DC | PRN
  Administered 2023-07-03: 80 mg via INTRAVENOUS

## 2023-07-03 MED ORDER — ONDANSETRON HCL 4 MG/2ML IJ SOLN: 4.0000 mg | Freq: Four times a day (QID) | INTRAMUSCULAR | Status: DC | PRN

## 2023-07-03 MED ORDER — ACETAMINOPHEN 325 MG PO TABS: 650.0000 mg | ORAL_TABLET | ORAL | Status: DC | PRN

## 2023-07-03 MED ORDER — ROCURONIUM BROMIDE 10 MG/ML (PF) SYRINGE
PREFILLED_SYRINGE | INTRAVENOUS | Status: DC | PRN
  Administered 2023-07-03: 50 mg via INTRAVENOUS

## 2023-07-03 SURGICAL SUPPLY — 24 items
BAG SNAP BAND KOVER 36X36 (MISCELLANEOUS) ×1 IMPLANT
BLANKET WARM UNDERBOD FULL ACC (MISCELLANEOUS) ×2 IMPLANT
CATH 8FR REPROCESSED SOUNDSTAR (CATHETERS) ×1 IMPLANT
CATH 8FR SOUNDSTAR REPROCESSED (CATHETERS) IMPLANT
CATH EZ STEER NAV 8MM F-J CUR (ABLATOR) ×1 IMPLANT
CATH OCTARAY 2.0 F 3-3-3-3-3 (CATHETERS) ×1 IMPLANT
CATH WEBSTER BI DIR CS D-F CRV (CATHETERS) ×1 IMPLANT
CLOSURE MYNX CONTROL 6F/7F (Vascular Products) ×2 IMPLANT
CLOSURE PERCLOSE PROSTYLE (VASCULAR PRODUCTS) ×2 IMPLANT
COVER SWIFTLINK CONNECTOR (BAG) ×2 IMPLANT
DILATOR VESSEL 38 20CM 16FR (INTRODUCER) ×1 IMPLANT
GUIDEWIRE INQWIRE 1.5J.035X260 (WIRE) IMPLANT
INQWIRE 1.5J .035X260CM (WIRE) ×1
MAT PREVALON FULL STRYKER (MISCELLANEOUS) ×1 IMPLANT
PACK EP LATEX FREE (CUSTOM PROCEDURE TRAY) ×1
PACK EP LF (CUSTOM PROCEDURE TRAY) ×2 IMPLANT
PAD DEFIB RADIO PHYSIO CONN (PAD) ×2 IMPLANT
PATCH CARTO3 (PAD) ×1 IMPLANT
SHEATH PINNACLE 7F 10CM (SHEATH) ×1 IMPLANT
SHEATH PINNACLE 8F 10CM (SHEATH) ×1 IMPLANT
SHEATH PINNACLE 9F 10CM (SHEATH) ×1 IMPLANT
SHEATH PROBE COVER 6X72 (BAG) ×1 IMPLANT
SHEATH SWARTZ RAMP 8.5F 60CM (SHEATH) ×1 IMPLANT
SHEATH WIRE KIT BAYLIS SL1 (KITS) ×1 IMPLANT

## 2023-07-03 NOTE — Transfer of Care (Signed)
Immediate Anesthesia Transfer of Care Note  Patient: Kayla Hendrix  Procedure(s) Performed: ATRIAL FIBRILLATION ABLATION  Patient Location: PACU  Anesthesia Type:General  Level of Consciousness: awake, alert , and oriented  Airway & Oxygen Therapy: Patient Spontanous Breathing and Patient connected to face mask oxygen  Post-op Assessment: Report given to RN and Post -op Vital signs reviewed and stable  Post vital signs: Reviewed and stable  Last Vitals:  Vitals Value Taken Time  BP 104/53 07/03/23 1224  Temp    Pulse 80 07/03/23 1226  Resp 13 07/03/23 1226  SpO2 89 % 07/03/23 1226  Vitals shown include unfiled device data.  Last Pain:  Vitals:   07/03/23 0913  TempSrc: Temporal         Complications: There were no known notable events for this encounter.

## 2023-07-03 NOTE — H&P (Signed)
Electrophysiology Office Note:   Date:  07/03/2023  ID:  Kayla Hendrix, Kayla Hendrix Apr 14, 1951, MRN [id number]  Primary Cardiologist: None Electrophysiologist: Kayla Hendrix Kayla Loa, MD      History of Present Illness:   Kayla Hendrix is a 72 y.o. female with h/o coronary artery disease, atrial fibrillation seen today for routine electrophysiology followup.  Since last being seen in our clinic the patient reports feeling shortness of breath and fatigue.  She has no chest pain.  She finds it difficult to do her daily activities due to her level of fatigue.  She is at this point questioning whether or not she wants to move forward with her ablation.  she denies chest pain, palpitations, dyspnea, PND, orthopnea, nausea, vomiting, dizziness, syncope, edema, weight gain, or early satiety.     She has a history of atrial fibrillation.  She has had ablation x 2, most recently 01/09/2022.  Despite this, she continued to have episodes of atrial fibrillation and is now post surgical convergent ablation 11/17/2022.  She was found to have coronary artery disease and is now status post LAD stent.  She has plans for catheter ablation 07/03/2023.     Today, denies symptoms of palpitations, chest pain, shortness of breath, orthopnea, PND, lower extremity edema, claudication, dizziness, presyncope, syncope, bleeding, or neurologic sequela. The patient is tolerating medications without difficulties. Plan ablation today.   Studies Reviewed:    EKG is ordered today. Personal review as below.       Risk Assessment/Calculations:    CHA2DS2-VASc Score = 5   This indicates a 7.2% annual risk of stroke. The patient's score is based upon: CHF History: 0 HTN History: 1 Diabetes History: 1 Stroke History: 0 Vascular Disease History: 1 Age Score: 1 Gender Score: 1             Physical Exam:   VS:  BP 109/71   Pulse (!) 150 Comment: Rn Notified  Temp 99.1 F (37.3 C) (Temporal)   Resp 20   Ht 5\' 5"  (1.651 m)    Wt 94.3 kg   SpO2 96%   BMI 34.61 kg/m    Wt Readings from Last 3 Encounters:  07/03/23 94.3 kg  05/01/23 94.8 kg  04/03/23 100.6 kg       PHYSICAL EXAM: VS:  BP 109/71   Pulse (!) 150 Comment: Rn Notified  Temp 99.1 F (37.3 C) (Temporal)   Resp 20   Ht 5\' 5"  (1.651 m)   Wt 94.3 kg   SpO2 96%   BMI 34.61 kg/m  , BMI Body mass index is 34.61 kg/m. GEN: Well nourished, well developed, in no acute distress  HEENT: normal  Neck: no JVD, carotid bruits, or masses Cardiac: irregular; no murmurs, rubs, or gallops,no edema  Respiratory:  clear to auscultation bilaterally, normal work of breathing GI: soft, nontender, nondistended, + BS MS: no deformity or atrophy  Skin: warm and dry Neuro:  Strength and sensation are intact Psych: euthymic mood, full affect  ASSESSMENT AND PLAN:    1.  Persistent atrial fibrillation: Kayla Hendrix has presented today for surgery, with the diagnosis of AF.  The various methods of treatment have been discussed with the patient and family. After consideration of risks, benefits and other options for treatment, the patient has consented to  Procedure(s): Catheter ablation as a surgical intervention .  Risks include but not limited to complete heart block, stroke, esophageal damage, nerve damage, bleeding, vascular damage, tamponade, perforation, MI, and death.  The patient's history has been reviewed, patient examined, no change in status, stable for surgery.  I have reviewed the patient's chart and labs.  Questions were answered to the patient's satisfaction.    Kayla Brooklyn, MD 07/03/2023 9:37 AM

## 2023-07-03 NOTE — Progress Notes (Signed)
Patient and son was given discharge instructions. Both verbalized understanding. 

## 2023-07-03 NOTE — Discharge Instructions (Signed)

## 2023-07-03 NOTE — Anesthesia Postprocedure Evaluation (Signed)
Anesthesia Post Note  Patient: Kayla Hendrix  Procedure(s) Performed: ATRIAL FIBRILLATION ABLATION     Patient location during evaluation: PACU Anesthesia Type: General Level of consciousness: awake and alert Pain management: pain level controlled Vital Signs Assessment: post-procedure vital signs reviewed and stable Respiratory status: spontaneous breathing, nonlabored ventilation, respiratory function stable and patient connected to nasal cannula oxygen Cardiovascular status: blood pressure returned to baseline and stable Postop Assessment: no apparent nausea or vomiting Anesthetic complications: no  There were no known notable events for this encounter.  Last Vitals:  Vitals:   07/03/23 1310 07/03/23 1315  BP: 111/61 (!) 112/57  Pulse: 78 77  Resp: 16 18  Temp:    SpO2: 93% 91%    Last Pain:  Vitals:   07/03/23 1257  TempSrc: Temporal  PainSc:                  Amma Crear L Aniken Monestime

## 2023-07-03 NOTE — Anesthesia Procedure Notes (Signed)
Procedure Name: Intubation Date/Time: 07/03/2023 10:27 AM  Performed by: Jonaya Freshour L, MDPre-anesthesia Checklist: Patient identified, Emergency Drugs available, Suction available and Patient being monitored Patient Re-evaluated:Patient Re-evaluated prior to induction Oxygen Delivery Method: Circle System Utilized Preoxygenation: Pre-oxygenation with 100% oxygen Induction Type: IV induction Ventilation: Mask ventilation without difficulty Laryngoscope Size: Mac and 3 Grade View: Grade I Tube type: Oral Tube size: 7.0 mm Number of attempts: 1 Airway Equipment and Method: Stylet and Oral airway Placement Confirmation: ETT inserted through vocal cords under direct vision, positive ETCO2 and breath sounds checked- equal and bilateral Secured at: 20 cm Tube secured with: Tape Dental Injury: Teeth and Oropharynx as per pre-operative assessment

## 2023-07-03 NOTE — Anesthesia Preprocedure Evaluation (Addendum)
Anesthesia Evaluation    Reviewed: Allergy & Precautions, Patient's Chart, lab work & pertinent test results, reviewed documented beta blocker date and time   Airway Mallampati: III  TM Distance: >3 FB Neck ROM: Full    Dental  (+) Edentulous Upper, Missing,    Pulmonary sleep apnea , former smoker   Pulmonary exam normal breath sounds clear to auscultation       Cardiovascular hypertension, Pt. on home beta blockers and Pt. on medications + CAD and + Cardiac Stents  Normal cardiovascular exam+ dysrhythmias (eliquis) Atrial Fibrillation  Rhythm:Regular Rate:Normal  TEE 2023  1. Left ventricular ejection fraction, by estimation, is 60 to 65%. The  left ventricle has normal function. The left ventricle has no regional  wall motion abnormalities.   2. Right ventricular systolic function is normal. The right ventricular  size is normal.   3. No left atrial/left atrial appendage thrombus was detected.   4. The mitral valve is normal in structure. Trivial mitral valve  regurgitation. No evidence of mitral stenosis.   5. The aortic valve is normal in structure. Aortic valve regurgitation is  not visualized. No aortic stenosis is present.   6. The inferior vena cava is normal in size with greater than 50%  respiratory variability, suggesting right atrial pressure of 3 mmHg.     Neuro/Psych  PSYCHIATRIC DISORDERS Anxiety Depression    negative neurological ROS     GI/Hepatic Neg liver ROS, hiatal hernia,GERD  ,,  Endo/Other  diabetes, Type 2    Renal/GU negative Renal ROS  negative genitourinary   Musculoskeletal negative musculoskeletal ROS (+)    Abdominal   Peds  Hematology  (+) Blood dyscrasia (plavix) , REFUSES BLOOD PRODUCTS (will accept albumin)  Anesthesia Other Findings   Reproductive/Obstetrics                             Anesthesia Physical Anesthesia Plan  ASA: 3  Anesthesia Plan:  General   Post-op Pain Management: Minimal or no pain anticipated   Induction: Intravenous  PONV Risk Score and Plan: 3 and Dexamethasone, Ondansetron and Treatment may vary due to age or medical condition  Airway Management Planned: Oral ETT  Additional Equipment:   Intra-op Plan:   Post-operative Plan: Extubation in OR  Informed Consent: I have reviewed the patients History and Physical, chart, labs and discussed the procedure including the risks, benefits and alternatives for the proposed anesthesia with the patient or authorized representative who has indicated his/her understanding and acceptance.     Dental advisory given  Plan Discussed with: CRNA  Anesthesia Plan Comments:        Anesthesia Quick Evaluation

## 2023-07-06 ENCOUNTER — Encounter (HOSPITAL_COMMUNITY): Payer: Self-pay | Admitting: Cardiology

## 2023-07-08 ENCOUNTER — Telehealth: Payer: Self-pay | Admitting: Cardiology

## 2023-07-08 NOTE — Telephone Encounter (Signed)
Called patient. Someone picked up and hung up x 2.  Called again and busy signal  x 3.  Will try again later

## 2023-07-08 NOTE — Telephone Encounter (Signed)
Pt states she would like to speak with a nurse about how she is feeling after her ablation she had. Please advise

## 2023-07-10 ENCOUNTER — Other Ambulatory Visit: Payer: Self-pay

## 2023-07-10 ENCOUNTER — Encounter (HOSPITAL_COMMUNITY): Payer: Self-pay

## 2023-07-10 ENCOUNTER — Other Ambulatory Visit (HOSPITAL_COMMUNITY): Payer: Self-pay

## 2023-07-10 ENCOUNTER — Emergency Department (HOSPITAL_COMMUNITY)
Admission: EM | Admit: 2023-07-10 | Discharge: 2023-07-10 | Disposition: A | Discharge status: Home / Self Care | Payer: BC Managed Care – PPO | Attending: Emergency Medicine | Admitting: Emergency Medicine

## 2023-07-10 ENCOUNTER — Emergency Department (HOSPITAL_COMMUNITY): Payer: BC Managed Care – PPO

## 2023-07-10 DIAGNOSIS — E119 Type 2 diabetes mellitus without complications: Secondary | ICD-10-CM | POA: Insufficient documentation

## 2023-07-10 DIAGNOSIS — I2510 Atherosclerotic heart disease of native coronary artery without angina pectoris: Secondary | ICD-10-CM | POA: Diagnosis not present

## 2023-07-10 DIAGNOSIS — I10 Essential (primary) hypertension: Secondary | ICD-10-CM | POA: Insufficient documentation

## 2023-07-10 DIAGNOSIS — I480 Paroxysmal atrial fibrillation: Secondary | ICD-10-CM

## 2023-07-10 DIAGNOSIS — Z87891 Personal history of nicotine dependence: Secondary | ICD-10-CM | POA: Diagnosis not present

## 2023-07-10 DIAGNOSIS — R002 Palpitations: Secondary | ICD-10-CM | POA: Diagnosis not present

## 2023-07-10 DIAGNOSIS — Z7901 Long term (current) use of anticoagulants: Secondary | ICD-10-CM | POA: Insufficient documentation

## 2023-07-10 DIAGNOSIS — Z79899 Other long term (current) drug therapy: Secondary | ICD-10-CM | POA: Insufficient documentation

## 2023-07-10 DIAGNOSIS — Z7902 Long term (current) use of antithrombotics/antiplatelets: Secondary | ICD-10-CM | POA: Diagnosis not present

## 2023-07-10 DIAGNOSIS — I4891 Unspecified atrial fibrillation: Secondary | ICD-10-CM | POA: Diagnosis not present

## 2023-07-10 LAB — TROPONIN I (HIGH SENSITIVITY)
Troponin I (High Sensitivity): 7 ng/L (ref <18)
Troponin I (High Sensitivity): 7 ng/L (ref <18)

## 2023-07-10 LAB — CBC
HCT: 34.2 % — ABNORMAL LOW (ref 36.0–46.0)
Hemoglobin: 10.6 g/dL — ABNORMAL LOW (ref 12.0–15.0)
MCH: 23.2 pg — ABNORMAL LOW (ref 26.0–34.0)
MCHC: 31 g/dL (ref 30.0–36.0)
MCV: 74.8 fL — ABNORMAL LOW (ref 80.0–100.0)
Platelets: 419 10*3/uL — ABNORMAL HIGH (ref 150–400)
RBC: 4.57 MIL/uL (ref 3.87–5.11)
RDW: 17.6 % — ABNORMAL HIGH (ref 11.5–15.5)
WBC: 8.7 10*3/uL (ref 4.0–10.5)
nRBC: 0 % (ref 0.0–0.2)

## 2023-07-10 LAB — BASIC METABOLIC PANEL
Anion gap: 15 (ref 5–15)
BUN: 20 mg/dL (ref 8–23)
CO2: 28 mmol/L (ref 22–32)
Calcium: 8.1 mg/dL — ABNORMAL LOW (ref 8.9–10.3)
Chloride: 97 mmol/L — ABNORMAL LOW (ref 98–111)
Creatinine, Ser: 1.71 mg/dL — ABNORMAL HIGH (ref 0.44–1.00)
GFR, Estimated: 32 mL/min — ABNORMAL LOW (ref >60)
Glucose, Bld: 113 mg/dL — ABNORMAL HIGH (ref 70–99)
Potassium: 3.3 mmol/L — ABNORMAL LOW (ref 3.5–5.1)
Sodium: 140 mmol/L (ref 135–145)

## 2023-07-10 MED ORDER — DRONEDARONE HCL 400 MG PO TABS: 400.0000 mg | ORAL_TABLET | Freq: Two times a day (BID) | ORAL | 0 refills | Status: DC

## 2023-07-10 MED ORDER — FENTANYL CITRATE PF 50 MCG/ML IJ SOSY
50.0000 ug | PREFILLED_SYRINGE | Freq: Once | INTRAMUSCULAR | Status: AC
  Administered 2023-07-10: 50 ug via INTRAVENOUS
  Filled 2023-07-10: qty 1

## 2023-07-10 MED ORDER — POTASSIUM CHLORIDE CRYS ER 20 MEQ PO TBCR
40.0000 meq | EXTENDED_RELEASE_TABLET | Freq: Once | ORAL | Status: AC
  Administered 2023-07-10: 40 meq via ORAL
  Filled 2023-07-10: qty 2

## 2023-07-10 MED ORDER — ETOMIDATE 2 MG/ML IV SOLN
10.0000 mg | Freq: Once | INTRAVENOUS | Status: AC
  Administered 2023-07-10: 10 mg via INTRAVENOUS
  Filled 2023-07-10: qty 10

## 2023-07-10 NOTE — ED Provider Notes (Signed)
[place] EMERGENCY DEPARTMENT AT [facility] Provider Note  CSN: [id number] Arrival date & time: 07/10/23 1519  Chief Complaint(s) A-Fib  HPI Kayla Hendrix is a 72 y.o. female history of atrial fibrillation on Eliquis, diabetes, hypertension, lipidemia, presenting to the emergency department with palpitations and weakness.  Patient reports longstanding history of atrial fibrillation and is symptomatic from this.  She had a recent ablation and since then has had recurrence of her symptoms.  Reports mild nausea, no chest pain, no syncope.  Reports chronic leg swelling which is unchanged.  Reports decreased fluid intake due to her generalized weakness.  No urinary symptoms.  No fevers or chills.  No back pain.  No abdominal pain.  Has been compliant with all of her medications including Eliquis.  Reports she did not hold Eliquis for her ablation.   Past Medical History Past Medical History:  Diagnosis Date   Allergy    Anemia    Anxiety    Atrial fibrillation (HCC)    paroxysmal only asa 81 mg, no other blood thinner, the xarelto she was on gave her HAs   Barrett's esophagus 2018   CAD (coronary artery disease)    cath 2006- nonobstructive CAD   Cataract    Depression    Diabetes mellitus without complication (HCC)    Gastric polyp    GERD (gastroesophageal reflux disease)    Glucose intolerance (impaired glucose tolerance)    Hiatal hernia    History of transesophageal echocardiography (TEE) for monitoring    TEE 3/17:  EF 55%, no RWMA, mild plaque in descending aorta, mild MR, mod BAE, normal RVF   HL (hearing loss)    HTN (hypertension)    Hyperlipidemia    Hyperplastic colon polyp    Internal hemorrhoids    Intestinal metaplasia of gastric mucosa    Obesity    OSA (obstructive sleep apnea)    noncompliant with CPAP   Sleep apnea    Spinal stenosis    Tubular adenoma of colon    Patient Active Problem List   Diagnosis Date Noted   CAD (coronary artery  disease) 04/03/2023   A-fib (HCC) 07/08/2022   Secondary hypercoagulable state (HCC) 02/24/2022   Gastric polyp    Loose stools    Carcinoid tumor of gastrointestinal tract    Chronic anticoagulation 08/01/2019   Fatty liver 08/01/2019   Gallstone (impacted) 08/01/2019   Obesity with body mass index greater than 30 08/01/2019   Atrial fibrillation (HCC) 02/11/2010   SHORTNESS OF BREATH 02/11/2010   HYPERCHOLESTEROLEMIA 08/06/2009   TOBACCO ABUSE 08/06/2009   Obstructive sleep apnea 08/03/2009   Essential hypertension 08/03/2009   GERD 08/03/2009   Home Medication(s) Prior to Admission medications   Medication Sig Start Date End Date Taking? Authorizing Provider  dronedarone (MULTAQ) 400 MG tablet Take 1 tablet (400 mg total) by mouth 2 (two) times daily with a meal. 07/10/23  Yes Lonell Grandchild, MD  acetaminophen (TYLENOL) 500 MG tablet Take 1,000 mg by mouth daily. Uses once a week if needed.    [provider]  apixaban (ELIQUIS) 5 MG TABS tablet Take 1 tablet (5 mg total) by mouth 2 (two) times daily. 02/06/23   Camnitz, Will Daphine Deutscher, MD  Apoaequorin (PREVAGEN EXTRA STRENGTH PO) Take 1 capsule by mouth daily.    [provider]  atorvastatin (LIPITOR) 40 MG tablet Take 40 mg by mouth 2 (two) times a week.    [provider]  clonazePAM Scarlette Calico) 0.5  MG tablet Take 0.5 mg by mouth daily as needed for anxiety.    [provider]  clopidogrel (PLAVIX) 75 MG tablet Take 1 tablet (75 mg total) by mouth daily. 04/06/23 04/05/24  Tereso Newcomer T, PA-C  diltiazem (TIAZAC) 360 MG 24 hr capsule Take 360 mg by mouth daily.    [provider]  furosemide (LASIX) 40 MG tablet Take 1 tablet (40 mg total) by mouth daily. 04/13/23   Tereso Newcomer T, PA-C  metoprolol tartrate (LOPRESSOR) 50 MG tablet Take 0.5 tablets (25 mg total) by mouth 2 (two) times daily. 04/05/23   Tereso Newcomer T, PA-C  pantoprazole (PROTONIX) 40 MG tablet Take 1 tablet (40 mg total)  by mouth daily. 04/06/23 04/05/24  Tereso Newcomer T, PA-C  pregabalin (LYRICA) 150 MG capsule Take 150 mg by mouth 2 (two) times daily. 12/05/22   [provider]  Semaglutide, 2 MG/DOSE, (OZEMPIC, 2 MG/DOSE,) 8 MG/3ML SOPN Inject 2 mg into the skin once a week. 10/06/22     spironolactone (ALDACTONE) 25 MG tablet Take 0.5 tablets (12.5 mg total) by mouth daily. 02/16/23 05/17/23  Sheilah Pigeon, PA-C  Vitamin D, Ergocalciferol, (DRISDOL) 1.25 MG (50000 UNIT) CAPS capsule Take 50,000 Units by mouth every 7 (seven) days.    [provider]                                                                                                                                    Past Surgical History Past Surgical History:  Procedure Laterality Date   ATRIAL FIBRILLATION ABLATION N/A 11/15/2020   Procedure: ATRIAL FIBRILLATION ABLATION;  Surgeon: Hillis Range, MD;  Location: [facility] INVASIVE CV LAB;  Service: Cardiovascular;  Laterality: N/A;   ATRIAL FIBRILLATION ABLATION N/A 01/09/2022   Procedure: ATRIAL FIBRILLATION ABLATION;  Surgeon: Regan Lemming, MD;  Location: [facility] INVASIVE CV LAB;  Service: Cardiovascular;  Laterality: N/A;   ATRIAL FIBRILLATION ABLATION N/A 07/03/2023   Procedure: ATRIAL FIBRILLATION ABLATION;  Surgeon: Regan Lemming, MD;  Location: [facility] INVAS[facility] CV LAB;  Service: Cardiovascular;  Laterality: N/A;   BIOPSY  10/17/2019   Procedure: BIOPSY;  Surgeon: Beverley Fiedler, MD;  Location: [facility] ENDOSCOPY;  Service: Gastroenterology;;   CARDIOVERSION N/A 01/23/2016   Procedure: CARDIOVERSION;  Surgeon: Laurey Morale, MD;  Location: [facility] ENDOSCOPY;  Service: Cardiovascular;  Laterality: N/A;   CARDIOVERSION N/A 12/07/2019   Procedure: CARDIOVERSION;  Surgeon: Chrystie Nose, MD;  Location: [facility] ENDOSCOPY;  Service: Cardiovascular;  Laterality: N/A;   CARDIOVERSION N/A 08/15/2020   Procedure: CARDIOVERSION;  Surgeon: Christell Constant, MD;  Location: [facility] ENDOSCOPY;   Service: Cardiovascular;  Laterality: N/A;   CARDIOVERSION N/A 12/27/2020   Procedure: CARDIOVERSION;  Surgeon: Jake Bathe, MD;  Location: [facility] ENDOSCOPY;  Service: Cardiovascular;  Laterality: N/A;   CARDIOVERSION N/A 08/21/2021   Procedure: CARDIOVERSION;  Surgeon: Chrystie Nose, MD;  Location: [facility] ENDOSCOPY;  Service:  Cardiovascular;  Laterality: N/A;   CARDIOVERSION N/A 03/03/2022   Procedure: CARDIOVERSION;  Surgeon: Meriam Sprague, MD;  Location: [facility] ENDOSCOPY;  Service: Cardiovascular;  Laterality: N/A;   CARDIOVERSION N/A 02/13/2023   Procedure: CARDIOVERSION;  Surgeon: Parke Poisson, MD;  Location: [facility] ENDOSCOPY;  Service: Cardiovascular;  Laterality: N/A;   CATARACT EXTRACTION     CESAREAN SECTION     CLIPPING OF ATRIAL APPENDAGE Left 11/17/2022   Procedure: CLIPPING OF ATRIAL APPENDAGE;  Surgeon: Lyn Hollingshead, MD;  Location: [facility] OR;  Service: Open Heart Surgery;  Laterality: Left;   CORONARY LITHOTRIPSY N/A 04/03/2023   Procedure: CORONARY LITHOTRIPSY;  Surgeon: Corky Crafts, MD;  Location: [facility] INVASIVE CV LAB;  Service: Cardiovascular;  Laterality: N/A;   CORONARY STENT INTERVENTION N/A 04/03/2023   Procedure: CORONARY STENT INTERVENTION;  Surgeon: Corky Crafts, MD;  Location: [facility] INVASIVE CV LAB;  Service: Cardiovascular;  Laterality: N/A;   CORONARY ULTRASOUND/IVUS N/A 04/03/2023   Procedure: Coronary Ultrasound/IVUS;  Surgeon: Corky Crafts, MD;  Location: [facility] INVASIVE CV LAB;  Service: Cardiovascular;  Laterality: N/A;   ESOPHAGOGASTRODUODENOSCOPY (EGD) WITH PROPOFOL N/A 10/17/2019   Procedure: ESOPHAGOGASTRODUODENOSCOPY (EGD) WITH PROPOFOL;  Surgeon: Beverley Fiedler, MD;  Location: [facility] ENDOSCOPY;  Service: Gastroenterology;  Laterality: N/A;   GANGLION CYST EXCISION Left 1970s   HEMOSTASIS CLIP PLACEMENT  10/17/2019   Procedure: HEMOSTASIS CLIP PLACEMENT;  Surgeon: Beverley Fiedler, MD;  Location: [facility] ENDOSCOPY;  Service: Gastroenterology;;   LEFT HEART  CATH AND CORONARY ANGIOGRAPHY N/A 11/06/2022   Procedure: LEFT HEART CATH AND CORONARY ANGIOGRAPHY;  Surgeon: Corky Crafts, MD;  Location: [facility] INVASIVE CV LAB;  Service: Cardiovascular;  Laterality: N/A;   POLYPECTOMY  10/17/2019   Procedure: POLYPECTOMY;  Surgeon: Beverley Fiedler, MD;  Location: [facility] ENDOSCOPY;  Service: Gastroenterology;;   TEE WITHOUT CARDIOVERSION N/A 01/23/2016   Procedure: TRANSESOPHAGEAL ECHOCARDIOGRAM (TEE);  Surgeon: Laurey Morale, MD;  Location: [facility] ENDOSCOPY;  Service: Cardiovascular;  Laterality: N/A;   TEE WITHOUT CARDIOVERSION N/A 01/09/2022   Procedure: TRANSESOPHAGEAL ECHOCARDIOGRAM (TEE);  Surgeon: Regan Lemming, MD;  Location: [facility] INVASIVE CV LAB;  Service: Cardiovascular;  Laterality: N/A;   TEE WITHOUT CARDIOVERSION N/A 11/17/2022   Procedure: TRANSESOPHAGEAL ECHOCARDIOGRAM (TEE);  Surgeon: Lyn Hollingshead, MD;  Location: [facility] OR;  Service: Open Heart Surgery;  Laterality: N/A;   TOTAL ABDOMINAL HYSTERECTOMY     WRIST FRACTURE SURGERY  2010   wrist fracture repair with metal rod   Family History Family History  Problem Relation Age of Onset   Cancer Father    Stomach cancer Father    Diabetes Other        noninsulin dependant DM   Hypertension Other    Stomach cancer Maternal Grandmother    Colon cancer Neg Hx    Colon polyps Neg Hx    Esophageal cancer Neg Hx    Rectal cancer Neg Hx     Social History Social History   Tobacco Use   Smoking status: Former    Current packs/day: 0.00    Types: Cigarettes    Quit date: 2010    Years since quitting: 14.6   Smokeless tobacco: Never   Tobacco comments:    15-pack-year smoker  Vaping Use   Vaping status: Never Used  Substance Use Topics   Alcohol use: Not Currently   Drug use: No   Allergies Amiodarone and Hydrocodone  Review of Systems Review of Systems  All other systems reviewed and are  negative.   Physical Exam Vital Signs  I have reviewed the triage vital signs BP (!)  111/56 (BP Location: Left Arm)   Pulse 62   Temp 97.7 F (36.5 C) (Oral)   Resp 15   Ht 5\' 6"  (1.676 m)   Wt 94.3 kg   SpO2 96%   BMI 33.57 kg/m  Physical Exam Vitals and nursing note reviewed.  Constitutional:      General: She is not in acute distress.    Appearance: She is well-developed.  HENT:     Head: Normocephalic and atraumatic.     Mouth/Throat:     Mouth: Mucous membranes are moist.  Eyes:     Pupils: Pupils are equal, round, and reactive to light.  Cardiovascular:     Rate and Rhythm: Tachycardia present. Rhythm irregular.     Heart sounds: No murmur heard. Pulmonary:     Effort: Pulmonary effort is normal. No respiratory distress.     Breath sounds: Normal breath sounds.  Abdominal:     General: Abdomen is flat.     Palpations: Abdomen is soft.     Tenderness: There is no abdominal tenderness.  Musculoskeletal:        General: No tenderness.     Right lower leg: Edema present.     Left lower leg: Edema present.  Skin:    General: Skin is warm and dry.  Neurological:     General: No focal deficit present.     Mental Status: She is alert. Mental status is at baseline.  Psychiatric:        Mood and Affect: Mood normal.        Behavior: Behavior normal.     ED Results and Treatments Labs (all labs ordered are listed, but only abnormal results are displayed) Labs Reviewed  BASIC METABOLIC PANEL - Abnormal; Notable for the following components:      Result Value   Potassium 3.3 (*)    Chloride 97 (*)    Glucose, Bld 113 (*)    Creatinine, Ser 1.71 (*)    Calcium 8.1 (*)    GFR, Estimated 32 (*)    All other components within normal limits  CBC - Abnormal; Notable for the following components:   Hemoglobin 10.6 (*)    HCT 34.2 (*)    MCV 74.8 (*)    MCH 23.2 (*)    RDW 17.6 (*)    Platelets 419 (*)    All other components within normal limits  TROPONIN I (HIGH SENSITIVITY)  TROPONIN I (HIGH SENSITIVITY)                                                                                                                           Radiology DG Chest 2 View  Result Date: 07/10/2023 CLINICAL DATA:  Atrial fibrillation. EXAM: CHEST - 2 VIEW COMPARISON:  November 28, 2022. FINDINGS: Stable cardiomediastinal silhouette. Both lungs are clear. Interval development of mild compression deformity  of probable L1 vertebral body concerning for fracture of indeterminate age. IMPRESSION: No active cardiopulmonary disease. Interval development of mild compression deformity of probable L1 vertebral body concerning for fracture of indeterminate age. Electronically Signed   By: Lupita Raider M.D.   On: 07/10/2023 16:23    Pertinent labs & imaging results that were available during my care of the patient were reviewed by me and considered in my medical decision making (see MDM for details).  Medications Ordered in ED Medications  potassium chloride SA (KLOR-CON M) CR tablet 40 mEq (has no administration in time range)  etomidate (AMIDATE) injection 10 mg (10 mg Intravenous Given 07/10/23 2008)  fentaNYL (SUBLIMAZE) injection 50 mcg (50 mcg Intravenous Given 07/10/23 2007)                                                                                                                                     Procedures .Cardioversion  Date/Time: 07/10/2023 9:30 PM  Performed by: Lonell Grandchild, MD Authorized by: Lonell Grandchild, MD   Consent:    Consent obtained:  Verbal and written   Consent given by:  Patient   Risks discussed:  Cutaneous burn, induced arrhythmia, death and pain   Alternatives discussed:  No treatment, anti-coagulation medication, delayed treatment, rate-control medication, alternative treatment and observation Pre-procedure details:    Cardioversion basis:  Elective   Rhythm:  Atrial fibrillation   Electrode placement:  Anterior-posterior Patient sedated: Yes. Refer to sedation procedure documentation for details of  sedation.  Attempt one:    Cardioversion mode:  Synchronous   Shock (Joules):  120   Shock outcome:  Conversion to normal sinus rhythm Post-procedure details:    Patient status:  Awake   Patient tolerance of procedure:  Tolerated well, no immediate complications .Sedation  Date/Time: 07/10/2023 9:31 PM  Performed by: Lonell Grandchild, MD Authorized by: Lonell Grandchild, MD   Consent:    Consent obtained:  Verbal and written   Consent given by:  Patient   Risks discussed:  Allergic reaction, dysrhythmia, inadequate sedation, nausea, vomiting, respiratory compromise necessitating ventilatory assistance and intubation, prolonged sedation necessitating reversal and prolonged hypoxia resulting in organ damage   Alternatives discussed:  Analgesia without sedation Universal protocol:    Immediately prior to procedure, a time out was called: yes     Patient identity confirmed:  Arm band and hospital-assigned identification number Indications:    Procedure performed:  Cardioversion   Procedure necessitating sedation performed by:  Physician performing sedation Pre-sedation assessment:    Time since last food or drink:  12 hr   ASA classification: class 1 - normal, healthy patient     Mallampati score:  I - soft palate, uvula, fauces, pillars visible   Pre-sedation assessments completed and reviewed: airway patency, cardiovascular function, hydration status, mental status, nausea/vomiting, pain level, respiratory function and temperature   Immediate pre-procedure details:  Reassessment: Patient reassessed immediately prior to procedure     Reviewed: vital signs, relevant labs/tests and NPO status     Verified: bag valve mask available, emergency equipment available, intubation equipment available, IV patency confirmed, oxygen available and reversal medications available   Procedure details (see MAR for exact dosages):    Preoxygenation:  Nasal cannula   Sedation:  Etomidate    Intended level of sedation: deep   Intra-procedure monitoring:  Continuous pulse oximetry, cardiac monitor, continuous capnometry, frequent LOC assessments, frequent vital sign checks and blood pressure monitoring   Intra-procedure events: none     Total Provider sedation time (minutes):  15 Post-procedure details:    Attendance: Constant attendance by certified staff until patient recovered     Recovery: Patient returned to pre-procedure baseline     Post-sedation assessments completed and reviewed: airway patency, cardiovascular function, hydration status, mental status, nausea/vomiting, pain level, respiratory function and temperature     Patient is stable for discharge or admission: yes     Procedure completion:  Tolerated well, no immediate complications   (including critical care time)  Medical Decision Making / ED Course   MDM:  72 year old female presenting to the emergency department with atrial fibrillation and palpitations.  Patient well-appearing, physical exam nonfocal other than irregular rhythm and mild LE edema which patient reports is chronic.  Seems most symptoms are from A-fib as patient reports that she is chronically very symptomatic whenever she develops A-fib.  Given recent ablation discussed with her cardiology team, spoke with Dr. Rennis Golden and PA Marjie Skiff who discussed with the patient's EP physician Dr. Elberta Fortis, they agree with cardioversion, patient is been compliant with her Eliquis, consented for sedation and cardioversion, performed with resolution of A-fib and in sinus rhythm.  Patient does report she feels better following this.  Patient's EP physician also request that we initiate patient on dronedarone 400 mg twice daily and this was prescribed for the patient as requested.  Will place cardiology referral for expedited follow-up.  Labs also notable for AKI which was discussed with the patient, encouraged increased fluid intake.  She has chronic lower  extremity swelling, lungs clear, lower concern for CHF as a cause of AKI.  She also has mild hypokalemia, given supplemental potassium in the emergency department.  Will discharge patient to home. All questions answered. Patient comfortable with plan of discharge. Return precautions discussed with patient and specified on the after visit summary.       Additional history obtained: -Additional history obtained from family -External records from outside source obtained and reviewed including: Chart review including previous notes, labs, imaging, consultation notes including previous cardiology notes   Lab Tests: -I ordered, reviewed, and interpreted labs.   The pertinent results include:   Labs Reviewed  BASIC METABOLIC PANEL - Abnormal; Notable for the following components:      Result Value   Potassium 3.3 (*)    Chloride 97 (*)    Glucose, Bld 113 (*)    Creatinine, Ser 1.71 (*)    Calcium 8.1 (*)    GFR, Estimated 32 (*)    All other components within normal limits  CBC - Abnormal; Notable for the following components:   Hemoglobin 10.6 (*)    HCT 34.2 (*)    MCV 74.8 (*)    MCH 23.2 (*)    RDW 17.6 (*)    Platelets 419 (*)    All other components within normal limits  TROPONIN I (HIGH SENSITIVITY)  TROPONIN  I (HIGH SENSITIVITY)    Notable for normal troponin, AKI, mild hypokalemia   EKG   EKG Interpretation Date/Time:  Friday July 10 2023 15:16:09 EDT Ventricular Rate:  97 PR Interval:    QRS Duration:  96 QT Interval:  378 QTC Calculation: 480 R Axis:   -48  Text Interpretation: Atrial fibrillation Left axis deviation Low voltage QRS Incomplete right bundle branch block Cannot rule out Anterior infarct , age undetermined Abnormal ECG When compared with ECG of 03-Jul-2023 12:34, PREVIOUS ECG IS PRESENT Confirmed by Alvino Blood ([id number]) on 07/10/2023 4:44:23 PM         Imaging Studies ordered: I ordered imaging studies including CXR On my  interpretation imaging demonstrates clear lungs, old compression fx I independently visualized and interpreted imaging. I agree with the radiologist interpretation   Medicines ordered and prescription drug management: Meds ordered this encounter  Medications   etomidate (AMIDATE) injection 10 mg   fentaNYL (SUBLIMAZE) injection 50 mcg   dronedarone (MULTAQ) 400 MG tablet    Sig: Take 1 tablet (400 mg total) by mouth 2 (two) times daily with a meal.    Dispense:  60 tablet    Refill:  0   potassium chloride SA (KLOR-CON M) CR tablet 40 mEq    -I have reviewed the patients home medicines and have made adjustments as needed   Consultations Obtained: I requested consultation with the cardiologists,  and discussed lab and imaging findings as well as pertinent plan - they recommend: discharge, prescribe dronedarone 400 mg BID   Cardiac Monitoring: The patient was maintained on a cardiac monitor.  I personally viewed and interpreted the cardiac monitored which showed an underlying rhythm of: afib  Social Determinants of Health:  Diagnosis or treatment significantly limited by social determinants of health: obesity   Reevaluation: After the interventions noted above, I reevaluated the patient and found that their symptoms have improved  Co morbidities that complicate the patient evaluation  Past Medical History:  Diagnosis Date   Allergy    Anemia    Anxiety    Atrial fibrillation (HCC)    paroxysmal only asa 81 mg, no other blood thinner, the xarelto she was on gave her HAs   Barrett's esophagus 2018   CAD (coronary artery disease)    cath 2006- nonobstructive CAD   Cataract    Depression    Diabetes mellitus without complication (HCC)    Gastric polyp    GERD (gastroesophageal reflux disease)    Glucose intolerance (impaired glucose tolerance)    Hiatal hernia    History of transesophageal echocardiography (TEE) for monitoring    TEE 3/17:  EF 55%, no RWMA, mild plaque  in descending aorta, mild MR, mod BAE, normal RVF   HL (hearing loss)    HTN (hypertension)    Hyperlipidemia    Hyperplastic colon polyp    Internal hemorrhoids    Intestinal metaplasia of gastric mucosa    Obesity    OSA (obstructive sleep apnea)    noncompliant with CPAP   Sleep apnea    Spinal stenosis    Tubular adenoma of colon       Dispostion: Disposition decision including need for hospitalization was considered, and patient discharged from emergency department.    Final Clinical Impression(s) / ED Diagnoses Final diagnoses:  Paroxysmal A-fib (HCC)     This chart was dictated using voice recognition software.  Despite best efforts to proofread,  errors can occur which can change the documentation meaning.  Lonell Grandchild, MD 07/10/23 2133

## 2023-07-10 NOTE — Sedation Documentation (Signed)
120J delivered

## 2023-07-10 NOTE — Telephone Encounter (Signed)
Pt is returning call.  

## 2023-07-10 NOTE — Telephone Encounter (Signed)
Informed that if HRs still that fast, she should go to ED for possible DCCV. Aware Dr. Elberta Fortis has alerted EP staff there in the hospital.  If you do go, they will talk to you about a possible rhythm medication to start. Patient verbalized understanding and agreeable to plan.

## 2023-07-10 NOTE — Discharge Instructions (Addendum)
We evaluated you for your atrial fibrillation.  We were able to perform a sedation and cardiovert you back into a normal rhythm.  Your cardiologist Dr. Elberta Fortis wants you to start a medication called dronedarone.  This is an antiarrhythmic medication.  Please call him Monday (or Tuesday if they are closed for the holiday) to schedule a close follow-up.  We have also placed an appointment request to help facilitate this.  Your lab test showed a mild kidney injury which is a sign of dehydration.  Please be sure to drink lots of fluids and have this rechecked with your primary doctor.  Your potassium is also slightly low.  We gave you some potassium in the emergency department.  Please eat high potassium foods such as bananas or potatoes.  If you have any new or worsening symptoms such as increasing weakness, fevers or chills, nausea or vomiting, chest pain, shortness of breath, lightheadedness, fainting, or any other new symptoms, please return to the emergency department.

## 2023-07-10 NOTE — Telephone Encounter (Signed)
Pt reports no energy -- and HRs "going so fast".  Shooting up to the 150s. Reporting low BPs:  97/79, 101/74 Aware I will discuss w/ MD and call her back. She is agreeable to plan.

## 2023-07-10 NOTE — ED Triage Notes (Addendum)
Pt came in via POV d/t feeling chest flutters more in the morning & often throughout the day since she had an ablation performed on 07/03/23. A/Ox4, denies any CP, does endorses some nausea & lightheadedness. Pt is A-Fib ranging from 90's to 144 bpm while in triage.

## 2023-07-15 ENCOUNTER — Ambulatory Visit (HOSPITAL_COMMUNITY)
Admission: RE | Admit: 2023-07-15 | Discharge: 2023-07-15 | Disposition: A | Discharge status: Home / Self Care | Payer: BC Managed Care – PPO | Source: Ambulatory Visit | Attending: Internal Medicine | Admitting: Internal Medicine

## 2023-07-15 VITALS — BP 128/60 | HR 61 | Ht 66.0 in | Wt 202.4 lb

## 2023-07-15 DIAGNOSIS — I4510 Unspecified right bundle-branch block: Secondary | ICD-10-CM | POA: Diagnosis not present

## 2023-07-15 DIAGNOSIS — Z955 Presence of coronary angioplasty implant and graft: Secondary | ICD-10-CM | POA: Diagnosis not present

## 2023-07-15 DIAGNOSIS — I4819 Other persistent atrial fibrillation: Secondary | ICD-10-CM | POA: Diagnosis not present

## 2023-07-15 DIAGNOSIS — I4891 Unspecified atrial fibrillation: Secondary | ICD-10-CM

## 2023-07-15 DIAGNOSIS — D6869 Other thrombophilia: Secondary | ICD-10-CM

## 2023-07-15 DIAGNOSIS — I440 Atrioventricular block, first degree: Secondary | ICD-10-CM | POA: Diagnosis not present

## 2023-07-15 DIAGNOSIS — R9431 Abnormal electrocardiogram [ECG] [EKG]: Secondary | ICD-10-CM | POA: Insufficient documentation

## 2023-07-15 DIAGNOSIS — Z7901 Long term (current) use of anticoagulants: Secondary | ICD-10-CM | POA: Diagnosis not present

## 2023-07-15 DIAGNOSIS — I4519 Other right bundle-branch block: Secondary | ICD-10-CM | POA: Diagnosis not present

## 2023-07-15 DIAGNOSIS — I2510 Atherosclerotic heart disease of native coronary artery without angina pectoris: Secondary | ICD-10-CM | POA: Diagnosis not present

## 2023-07-15 LAB — COMPREHENSIVE METABOLIC PANEL
ALT: 13 U/L (ref 0–44)
AST: 16 U/L (ref 15–41)
Albumin: 2.8 g/dL — ABNORMAL LOW (ref 3.5–5.0)
Alkaline Phosphatase: 68 U/L (ref 38–126)
Anion gap: 13 (ref 5–15)
BUN: 13 mg/dL (ref 8–23)
CO2: 29 mmol/L (ref 22–32)
Calcium: 8.2 mg/dL — ABNORMAL LOW (ref 8.9–10.3)
Chloride: 101 mmol/L (ref 98–111)
Creatinine, Ser: 1.51 mg/dL — ABNORMAL HIGH (ref 0.44–1.00)
GFR, Estimated: 37 mL/min — ABNORMAL LOW (ref >60)
Glucose, Bld: 112 mg/dL — ABNORMAL HIGH (ref 70–99)
Potassium: 4 mmol/L (ref 3.5–5.1)
Sodium: 143 mmol/L (ref 135–145)
Total Bilirubin: 0.4 mg/dL (ref 0.3–1.2)
Total Protein: 6.4 g/dL — ABNORMAL LOW (ref 6.5–8.1)
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
## 2023-07-17 ENCOUNTER — Other Ambulatory Visit: Payer: Self-pay | Admitting: Cardiology

## 2023-07-17 DIAGNOSIS — I480 Paroxysmal atrial fibrillation: Secondary | ICD-10-CM

## 2023-07-23 ENCOUNTER — Telehealth: Payer: Self-pay

## 2023-07-23 DIAGNOSIS — N182 Chronic kidney disease, stage 2 (mild): Secondary | ICD-10-CM | POA: Diagnosis not present

## 2023-07-23 DIAGNOSIS — N179 Acute kidney failure, unspecified: Secondary | ICD-10-CM | POA: Diagnosis not present

## 2023-07-23 DIAGNOSIS — Z23 Encounter for immunization: Secondary | ICD-10-CM | POA: Diagnosis not present

## 2023-07-23 NOTE — Telephone Encounter (Signed)
Transition Care Management Follow-up Telephone Call Date of discharge and from where: 07/10/2023 [facility] How have you been since you were released from the hospital? Patient stated she is feeling much better. Any questions or concerns? No  Items Reviewed: Did the pt receive and understand the discharge instructions provided? Yes  Medications obtained and verified? Yes  Other? No  Any new allergies since your discharge? No  Dietary orders reviewed? Yes Do you have support at home? Yes   Follow up appointments reviewed:  PCP Hospital f/u appt confirmed? No  Scheduled to see  on  @ . Specialist Hospital f/u appt confirmed? Yes  Scheduled to see Eustace Pen, PA-C  on 07/15/2023 @ [facility]. Are transportation arrangements needed? No  If their condition worsens, is the pt aware to call PCP or go to the Emergency Dept.? Yes Was the patient provided with contact information for the PCP's office or ED? Yes Was to pt encouraged to call back with questions or concerns? Yes  Sienna Stonehocker [facility]  [facility] Guide Direct Dial: [phone]  Website: [email or web address]

## 2023-07-26 ENCOUNTER — Other Ambulatory Visit: Payer: Self-pay | Admitting: Cardiology

## 2023-07-31 ENCOUNTER — Ambulatory Visit (HOSPITAL_COMMUNITY): Payer: BC Managed Care – PPO | Admitting: Internal Medicine

## 2023-08-18 ENCOUNTER — Telehealth: Payer: Self-pay | Admitting: Cardiology

## 2023-08-18 MED ORDER — DRONEDARONE HCL 400 MG PO TABS: 400.0000 mg | ORAL_TABLET | Freq: Two times a day (BID) | ORAL | 2 refills | Status: DC

## 2023-08-18 NOTE — Telephone Encounter (Signed)
Patient is calling to talk with Dr. Elberta Fortis or nurse. Please call back and  discuss

## 2023-08-18 NOTE — Telephone Encounter (Signed)
Pt c/o medication issue:  1. Name of Medication:   dronedarone (MULTAQ) 400 MG tablet    2. How are you currently taking this medication (dosage and times per day)?    3. Are you having a reaction (difficulty breathing--STAT)? no  4. What is your medication issue? Patient calling about this medication, to see if she can get refills. Please advise

## 2023-08-18 NOTE — Telephone Encounter (Signed)
Pt's medication was sent to pt's pharmacy as requested. Confirmation received.  °

## 2023-08-18 NOTE — Telephone Encounter (Signed)
Spoke with patient and she was calling to see if multaq was sent to pharmacy. Confirmed multaq was sent to pharmacy today.  Please see previous encounter

## 2023-08-26 ENCOUNTER — Ambulatory Visit (HOSPITAL_COMMUNITY)
Admission: RE | Admit: 2023-08-26 | Discharge: 2023-08-26 | Disposition: A | Discharge status: Home / Self Care | Payer: BC Managed Care – PPO | Source: Ambulatory Visit | Attending: Internal Medicine | Admitting: Internal Medicine

## 2023-08-26 ENCOUNTER — Encounter (HOSPITAL_COMMUNITY): Payer: Self-pay | Admitting: Internal Medicine

## 2023-08-26 VITALS — BP 110/52 | HR 41 | Ht 66.0 in | Wt 207.4 lb

## 2023-08-26 DIAGNOSIS — R001 Bradycardia, unspecified: Secondary | ICD-10-CM | POA: Diagnosis not present

## 2023-08-26 DIAGNOSIS — Z7901 Long term (current) use of anticoagulants: Secondary | ICD-10-CM | POA: Diagnosis not present

## 2023-08-26 DIAGNOSIS — Z955 Presence of coronary angioplasty implant and graft: Secondary | ICD-10-CM | POA: Diagnosis not present

## 2023-08-26 DIAGNOSIS — Z79899 Other long term (current) drug therapy: Secondary | ICD-10-CM | POA: Diagnosis not present

## 2023-08-26 DIAGNOSIS — I2510 Atherosclerotic heart disease of native coronary artery without angina pectoris: Secondary | ICD-10-CM | POA: Diagnosis not present

## 2023-08-26 DIAGNOSIS — I4819 Other persistent atrial fibrillation: Secondary | ICD-10-CM | POA: Insufficient documentation

## 2023-08-26 DIAGNOSIS — Z5181 Encounter for therapeutic drug level monitoring: Secondary | ICD-10-CM

## 2023-08-26 DIAGNOSIS — I4891 Unspecified atrial fibrillation: Secondary | ICD-10-CM | POA: Diagnosis not present

## 2023-08-26 LAB — COMPREHENSIVE METABOLIC PANEL
ALT: 12 U/L (ref 0–44)
AST: 17 U/L (ref 15–41)
Albumin: 3.3 g/dL — ABNORMAL LOW (ref 3.5–5.0)
Alkaline Phosphatase: 71 U/L (ref 38–126)
Anion gap: 11 (ref 5–15)
BUN: 18 mg/dL (ref 8–23)
CO2: 29 mmol/L (ref 22–32)
Calcium: 8.9 mg/dL (ref 8.9–10.3)
Chloride: 102 mmol/L (ref 98–111)
Creatinine, Ser: 1.79 mg/dL — ABNORMAL HIGH (ref 0.44–1.00)
GFR, Estimated: 30 mL/min — ABNORMAL LOW (ref >60)
Glucose, Bld: 97 mg/dL (ref 70–99)
Potassium: 5 mmol/L (ref 3.5–5.1)
Sodium: 142 mmol/L (ref 135–145)
Total Bilirubin: 0.6 mg/dL (ref 0.3–1.2)
Total Protein: 6.4 g/dL — ABNORMAL LOW (ref 6.5–8.1)

## 2023-08-26 MED ORDER — METOPROLOL TARTRATE 25 MG PO TABS: 12.5000 mg | ORAL_TABLET | Freq: Two times a day (BID) | ORAL | 3 refills | Status: DC

## 2023-08-26 NOTE — Patient Instructions (Addendum)
Decrease metoprolol to 12.5mg  twice a day (1/2 of the 25mg  tablet twice daily) -- call with update of heart rate response.
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
## 2023-09-09 ENCOUNTER — Other Ambulatory Visit: Payer: Self-pay | Admitting: Internal Medicine

## 2023-09-09 DIAGNOSIS — M8580 Other specified disorders of bone density and structure, unspecified site: Secondary | ICD-10-CM

## 2023-09-10 DIAGNOSIS — Z1389 Encounter for screening for other disorder: Secondary | ICD-10-CM | POA: Diagnosis not present

## 2023-09-10 DIAGNOSIS — M8580 Other specified disorders of bone density and structure, unspecified site: Secondary | ICD-10-CM | POA: Diagnosis not present

## 2023-09-10 DIAGNOSIS — E1122 Type 2 diabetes mellitus with diabetic chronic kidney disease: Secondary | ICD-10-CM | POA: Diagnosis not present

## 2023-09-17 DIAGNOSIS — Z0000 Encounter for general adult medical examination without abnormal findings: Secondary | ICD-10-CM | POA: Diagnosis not present

## 2023-09-17 DIAGNOSIS — Z23 Encounter for immunization: Secondary | ICD-10-CM | POA: Diagnosis not present

## 2023-09-17 DIAGNOSIS — Z1339 Encounter for screening examination for other mental health and behavioral disorders: Secondary | ICD-10-CM | POA: Diagnosis not present

## 2023-09-17 DIAGNOSIS — E1122 Type 2 diabetes mellitus with diabetic chronic kidney disease: Secondary | ICD-10-CM | POA: Diagnosis not present

## 2023-09-17 DIAGNOSIS — M40204 Unspecified kyphosis, thoracic region: Secondary | ICD-10-CM | POA: Diagnosis not present

## 2023-09-17 DIAGNOSIS — Z1331 Encounter for screening for depression: Secondary | ICD-10-CM | POA: Diagnosis not present

## 2023-09-22 ENCOUNTER — Encounter: Payer: Self-pay | Admitting: *Deleted

## 2023-09-22 ENCOUNTER — Telehealth: Payer: Self-pay | Admitting: *Deleted

## 2023-09-22 NOTE — Telephone Encounter (Signed)
-----   Message from Tereso Newcomer sent at 09/22/2023 10:34 AM EST ----- Received note from insurance that Omeprazole was prescribed. This drug can interact with Clopidogrel. This can result in stent thrombosis. She should be on Protonix instead of Omeprazole.  Please have pt stop Omeprazole and send in Protonix 40 mg once daily. Tereso Newcomer, PA-C    09/22/2023 10:33 AM

## 2023-09-22 NOTE — Telephone Encounter (Signed)
Call placed to pt regarding message below.  Left a message for her to call back.

## 2023-10-07 ENCOUNTER — Encounter (HOSPITAL_COMMUNITY): Payer: Self-pay | Admitting: Emergency Medicine

## 2023-10-07 ENCOUNTER — Other Ambulatory Visit: Payer: Self-pay

## 2023-10-07 ENCOUNTER — Emergency Department (HOSPITAL_COMMUNITY)
Admission: EM | Admit: 2023-10-07 | Discharge: 2023-10-07 | Disposition: A | Discharge status: Home / Self Care | Payer: BC Managed Care – PPO

## 2023-10-07 DIAGNOSIS — E119 Type 2 diabetes mellitus without complications: Secondary | ICD-10-CM | POA: Diagnosis not present

## 2023-10-07 DIAGNOSIS — R040 Epistaxis: Secondary | ICD-10-CM | POA: Insufficient documentation

## 2023-10-07 DIAGNOSIS — I2510 Atherosclerotic heart disease of native coronary artery without angina pectoris: Secondary | ICD-10-CM | POA: Insufficient documentation

## 2023-10-07 DIAGNOSIS — Z794 Long term (current) use of insulin: Secondary | ICD-10-CM | POA: Insufficient documentation

## 2023-10-07 DIAGNOSIS — I4891 Unspecified atrial fibrillation: Secondary | ICD-10-CM | POA: Insufficient documentation

## 2023-10-07 DIAGNOSIS — Z7901 Long term (current) use of anticoagulants: Secondary | ICD-10-CM | POA: Insufficient documentation

## 2023-10-07 MED ORDER — OXYMETAZOLINE HCL 0.05 % NA SOLN
1.0000 | Freq: Once | NASAL | Status: DC
  Filled 2023-10-07: qty 30

## 2023-10-07 NOTE — Discharge Instructions (Addendum)
Please follow-up with your ENT specialist in regards recent ER visit.  Today your nosebleed was manage and you can go home with the nasal plug that we made for you.  You may use the liquid Afrin at home but only dab the front part of your nose when you experience bleeding.  In the meantime please blow your nose softly to avoid irritating the nasal skin that may cause a rebleed.  If symptoms are change or worsen please return to ER.

## 2023-10-07 NOTE — ED Triage Notes (Signed)
Pt reports nose bleed "on and off for 2 weeks." Pt had right side of the nose cauterized at ENT. Called their office today and was told to come to the ER. Pt is on Eliquis and Plavix.

## 2023-10-07 NOTE — ED Provider Notes (Signed)
[place] EMERGENCY DEPARTMENT AT [facility] Provider Note   CSN: [id number] Arrival date & time: 10/07/23  1449     History  Chief Complaint  Patient presents with   Epistaxis    Kayla Hendrix is a 72 y.o. female history of A-fib on Eliquis daily, CAD, diabetes, GERD presented for epistaxis for the past 2 weeks.  Patient states that she has had on and off nosebleeds in which she has 1 nosebleed a day may be 2 times a day.  Patient went to ENT yesterday and had a right anterior nare cauterized but states that she woke up this morning with bleeding.  Patient states the bleeding is well-controlled but does also endorse blowing her nose aggressively over the past few weeks due to feeling congested.  Patient denies any trauma.  Patient has Afrin vials at home given to her by ENT but has not used these out of concern that this may affect her A-fib.  Home Medications Prior to Admission medications   Medication Sig Start Date End Date Taking? Authorizing Provider  acetaminophen (TYLENOL) 500 MG tablet Take 1,000 mg by mouth as needed. Uses once a week if needed.    [provider]  Apoaequorin (PREVAGEN EXTRA STRENGTH PO) Take 1 capsule by mouth daily.    [provider]  atorvastatin (LIPITOR) 40 MG tablet Take 40 mg by mouth 2 (two) times a week.    [provider]  clonazePAM (KLONOPIN) 0.5 MG tablet Take 0.5 mg by mouth daily as needed for anxiety.    [provider]  clopidogrel (PLAVIX) 75 MG tablet Take 1 tablet (75 mg total) by mouth daily. 04/06/23 04/05/24  Tereso Newcomer T, PA-C  diltiazem (TIAZAC) 360 MG 24 hr capsule Take 360 mg by mouth daily.    [provider]  dronedarone (MULTAQ) 400 MG tablet Take 1 tablet (400 mg total) by mouth 2 (two) times daily with a meal. 08/18/23   Camnitz, Will Daphine Deutscher, MD  ELIQUIS 5 MG TABS tablet TAKE 1 TABLET BY MOUTH TWICE A DAY 07/17/23   Camnitz, Andree Coss, MD  furosemide (LASIX) 40 MG  tablet Take 1 tablet (40 mg total) by mouth daily. 04/13/23   Tereso Newcomer T, PA-C  metoprolol tartrate (LOPRESSOR) 25 MG tablet Take 0.5 tablets (12.5 mg total) by mouth 2 (two) times daily. 08/26/23   Eustace Pen, PA-C  mupirocin ointment (BACTROBAN) 2 % Apply topically as needed. 05/18/23   [provider]  pantoprazole (PROTONIX) 40 MG tablet Take 1 tablet (40 mg total) by mouth daily. 04/06/23 04/05/24  Tereso Newcomer T, PA-C  pregabalin (LYRICA) 150 MG capsule Take 150 mg by mouth 2 (two) times daily. 12/05/22   [provider]  Semaglutide, 2 MG/DOSE, (OZEMPIC, 2 MG/DOSE,) 8 MG/3ML SOPN Inject 2 mg into the skin once a week. 10/06/22     spironolactone (ALDACTONE) 25 MG tablet Take 0.5 tablets (12.5 mg total) by mouth daily. 02/16/23 08/26/23  Sheilah Pigeon, PA-C  triamcinolone cream (KENALOG) 0.1 % Apply topically 2 (two) times daily as needed. 05/18/23   [provider]  Vitamin D, Ergocalciferol, (DRISDOL) 1.25 MG (50000 UNIT) CAPS capsule Take 50,000 Units by mouth every 7 (seven) days.    [provider]      Allergies    Amiodarone and Hydrocodone    Review of Systems   Review of Systems  HENT:  Positive for nosebleeds.     Physical Exam Updated Vital Signs  BP (!) 115/53 (BP Location: Right Arm)   Pulse 68   Temp 98 F (36.7 C) (Oral)   Resp 18   SpO2 100%  Physical Exam Constitutional:      General: She is not in acute distress. HENT:     Nose:     Comments: No signs of trauma Patient does have a blood clot formed in the right nare after removal of the gauze in which patient was able to blow out the blood clot and after visualizing I do not see any signs of active hemorrhaging or septal hematoma No signs of deformities Not actively hemorrhaging Can see where her nose was cauterized yesterday in the anterior portion of the right nare Skin:    General: Skin is warm and dry.     Coloration: Skin is not pale.  Neurological:      Mental Status: She is alert.     ED Results / Procedures / Treatments   Labs (all labs ordered are listed, but only abnormal results are displayed) Labs Reviewed - No data to display  EKG None  Radiology No results found.  Procedures Procedures    Medications Ordered in ED Medications  oxymetazoline (AFRIN) 0.05 % nasal spray 1 spray (1 spray Each Nare Not Given 10/07/23 1601)    ED Course/ Medical Decision Making/ A&P                                 Medical Decision Making Risk OTC drugs.   Kayla Hendrix 72 y.o. presented today for epistaxis. Working DDx that I considered at this time includes, but not limited to, anterior/posterior epistaxis, septal hematoma, nasal fracture, trauma, deformity, airway compromise.  R/o DDx: posterior epistaxis, septal hematoma, nasal fracture, trauma, deformity, airway compromise: These are considered less likely due to history of present illness, physical exam, labs/imaging findings  Review of prior external notes: 10/07/2023 initial consult ENT  Unique Tests and My Interpretation: None  Social Determinants of Health: none  Discussion with Independent Historian: None  Discussion of Management of Tests: None  Risk: Low: based on diagnostic testing/clinical impression and treatment plan  Risk Stratification Score: none  Staffed with Young, DO  Plan: On exam patient was no acute distress with stable vitals.  On exam patient did initially have a piece of gauze in her right nare where she was bleeding from and after this was removed this a blood clot that had formed in which patient was able to blow out this blood clot.  I was able to see where her nose been cauterized yesterday by ENT however I do not see any signs of active hemorrhaging.  After long discussion with the patient we had shared decision making agreed to have a makeshift nose plug made to apply direct nasal pressure for 15 minutes and will reevaluate.  I offered the  patient nasal packing as she is technically already had cauterization by ENT however patient declined at this time and wants to try direct nasal pressure instead.  Patient does have Afrin at home given to her by ENT in the form of a liquid that she is to use with a Q-tip or cotton swab to dab on her nose however patient did not use this at home for fear of this exacerbating her A-fib.  Patient declined Afrin here as well due to the same effect.  I did recommend that patient blow her nose more softly  as patient does endorse blowing her nose vigorously which I do suspect is contributing to her epistaxis along with her Eliquis.  If patient is no longer hemorrhaging after 30 minutes of the manual pressure will discharge with ENT follow-up.  On recheck 30 minutes later patient's nose was still not hemorrhaging.  Patient I had another conversation and patient at this time feels comfortable being discharged and following up with her ENT specialist.  Will give patient a makeshift nose plug made for her and encouraged her to use the liquid Afrin given to her by ENT and to follow-up with her ENT specialist.  Recommended patient does not blow aggressively when she is blowing her nose as I do suspect this is contributing to her symptoms.  Patient also notes she is not any antihistamines and states this occurred once weather started getting cold so there may be a seasonal component and recommended that she uses Claritin or Zyrtec daily to help with the congestion.  Patient was given return precautions. Patient stable for discharge at this time.  Patient verbalized understanding of plan.  This chart was dictated using voice recognition software.  Despite best efforts to proofread,  errors can occur which can change the documentation meaning.         Final Clinical Impression(s) / ED Diagnoses Final diagnoses:  Epistaxis    Rx / DC Orders ED Discharge Orders     None         Netta Corrigan,  PA-C 10/07/23 1646    Coral Spikes, DO 10/07/23 2345

## 2023-10-13 ENCOUNTER — Encounter: Payer: Self-pay | Admitting: Cardiology

## 2023-10-13 ENCOUNTER — Ambulatory Visit: Payer: BC Managed Care – PPO | Attending: Cardiology | Admitting: Cardiology

## 2023-10-13 VITALS — BP 120/68 | HR 51 | Ht 66.0 in | Wt 215.8 lb

## 2023-10-13 DIAGNOSIS — I4819 Other persistent atrial fibrillation: Secondary | ICD-10-CM

## 2023-10-13 DIAGNOSIS — I10 Essential (primary) hypertension: Secondary | ICD-10-CM | POA: Diagnosis not present

## 2023-10-13 DIAGNOSIS — D6869 Other thrombophilia: Secondary | ICD-10-CM | POA: Diagnosis not present

## 2023-10-13 DIAGNOSIS — I2510 Atherosclerotic heart disease of native coronary artery without angina pectoris: Secondary | ICD-10-CM

## 2023-10-13 NOTE — Progress Notes (Signed)
  Electrophysiology Office Note:   Date:  10/13/2023  ID:  Kayla Hendrix, DOB [birth year], MRN [id number]  Primary Cardiologist: None Electrophysiologist: Danetta Prom Jorja Loa, MD      History of Present Illness:   Kayla TRIMBUR is a 72 y.o. female with h/o coronary artery disease, atrial fibrillation seen today for routine electrophysiology followup.   Since last being seen in our clinic the patient reports doing overall well.  She has noted no further episodes of atrial fibrillation.  She does note some shortness of breath when she exerts herself.  She is also noted an increase in lower extremity edema.  Also been dealing with nosebleeds.  She did have cauterization at 1 point.  Her last nosebleed was a few days ago.  she denies chest pain, palpitations, PND, orthopnea, nausea, vomiting, dizziness, syncope, weight gain, or early satiety.   Review of systems complete and found to be negative unless listed in HPI.   EP Information / Studies Reviewed:    EKG is ordered today. Personal review as below.  EKG Interpretation Date/Time:  Tuesday October 13 2023 16:29:26 EST Ventricular Rate:  51 PR Interval:  224 QRS Duration:  110 QT Interval:  476 QTC Calculation: 438 R Axis:   -43  Text Interpretation: Sinus bradycardia with 1st degree A-V block Left axis deviation Low voltage QRS Right bundle branch block When compared with ECG of 26-Aug-2023 15:53, No significant change since last tracing Confirmed by Brentlee Sciara ([id number]) on 10/13/2023 4:34:35 PM     Risk Assessment/Calculations:    CHA2DS2-VASc Score = 5   This indicates a 7.2% annual risk of stroke. The patient's score is based upon: CHF History: 0 HTN History: 1 Diabetes History: 1 Stroke History: 0 Vascular Disease History: 1 Age Score: 1 Gender Score: 1             Physical Exam:   VS:  BP 120/68 (BP Location: Right Arm, Patient Position: Sitting, Cuff Size: Large)   Pulse (!) 51   Ht 5\' 6"  (1.676 m)   Wt 215  lb 12.8 oz (97.9 kg)   SpO2 98%   BMI 34.83 kg/m    Wt Readings from Last 3 Encounters:  10/13/23 215 lb 12.8 oz (97.9 kg)  08/26/23 207 lb 6.4 oz (94.1 kg)  07/15/23 202 lb 6.4 oz (91.8 kg)     GEN: Well nourished, well developed in no acute distress NECK: No JVD; No carotid bruits CARDIAC: Regular rate and rhythm, no murmurs, rubs, gallops RESPIRATORY:  Clear to auscultation without rales, wheezing or rhonchi  ABDOMEN: Soft, non-tender, non-distended EXTREMITIES:  No edema; No deformity   ASSESSMENT AND PLAN:    1.  Persistent atrial fibrillation: Post convergent ablation January 2024 with catheter ablation 07/03/2023.  Mains in sinus rhythm.  She is having some dyspnea on exertion.  Heart rates are slow.  Trenisha Lafavor stop metoprolol today.  If she continues to have dyspnea, may need to reduce the dose of her diltiazem.  2.  Coronary artery disease: Post LAD PCI.  Yidel Teuscher have her establish with general cardiology.  She has been having nosebleeds.  Sanora Cunanan discuss with interventional cardiology whether or not we can stop Plavix.  3.  Hypertension: Currently well-controlled  4.  Secondary hypercoagulable state: Currently on Eliquis for atrial fibrillation  Follow up with Afib Clinic in 6 months  Signed, Perry Brucato Jorja Loa, MD

## 2023-10-13 NOTE — Patient Instructions (Addendum)
Medication Instructions:  Your physician has recommended you make the following change in your medication:  STOP Metoprolol Tartrate (Lopressor) TAKE Lasix TWICE daily for 3 days  then return to normal daily dosing  *If you need a refill on your cardiac medications before your next appointment, please call your pharmacy*   Lab Work: None ordered   Testing/Procedures: None ordered   Follow-Up: At [facility], you and your health needs are our priority.  As part of our continuing mission to provide you with exceptional heart care, we have created designated Provider Care Teams.  These Care Teams include your primary Cardiologist (physician) and Advanced Practice Providers (APPs -  Physician Assistants and Nurse Practitioners) who all work together to provide you with the care you need, when you need it.  You have been referred to establish with a general cardiologist, Dr. Anne Fu.   Your next appointment:   6 month(s)  The format for your next appointment:   In Person  Provider:   You will follow up in the Atrial Fibrillation Clinic located at [facility]. Your provider will be: Clint R. Fenton, PA-C or Lake Bells, PA-C    Thank you for choosing [facility]!!   Dory Horn, RN [phone]

## 2023-10-20 ENCOUNTER — Other Ambulatory Visit: Payer: Self-pay | Admitting: Cardiology

## 2023-11-12 ENCOUNTER — Encounter: Payer: Self-pay | Admitting: *Deleted

## 2023-11-12 NOTE — Telephone Encounter (Signed)
 2nd attempt to reach pt, left a message for pt to call back.

## 2023-11-16 ENCOUNTER — Telehealth: Payer: Self-pay | Admitting: Cardiology

## 2023-11-16 MED ORDER — DILTIAZEM HCL ER BEADS 360 MG PO CP24: 360.0000 mg | ORAL_CAPSULE | Freq: Every day | ORAL | 3 refills | Status: DC

## 2023-11-16 NOTE — Telephone Encounter (Signed)
 *  STAT* If patient is at the pharmacy, call can be transferred to refill team.   1. Which medications need to be refilled? (please list name of each medication and dose if known)   diltiazem  (TIAZAC ) 360 MG 24 hr capsule     2. Would you like to learn more about the convenience, safety, & potential cost savings by using the [facility]?    3. Are you open to using the [facility] (Type [facility].    4. Which pharmacy/location (including street and city if local pharmacy) is medication to be sent to?  [id number] - [place], [place] - [address]     5. Do they need a 30 day or 90 day supply? 90 days   Pt is out of meds need refill today

## 2023-11-24 ENCOUNTER — Other Ambulatory Visit (HOSPITAL_COMMUNITY): Payer: Self-pay | Admitting: Internal Medicine

## 2023-12-21 ENCOUNTER — Ambulatory Visit: Payer: BC Managed Care – PPO | Attending: Cardiology | Admitting: Cardiology

## 2023-12-21 ENCOUNTER — Encounter: Payer: Self-pay | Admitting: Cardiology

## 2023-12-21 VITALS — BP 132/66 | HR 45 | Ht 66.0 in | Wt 222.2 lb

## 2023-12-21 DIAGNOSIS — I10 Essential (primary) hypertension: Secondary | ICD-10-CM | POA: Diagnosis not present

## 2023-12-21 DIAGNOSIS — I2510 Atherosclerotic heart disease of native coronary artery without angina pectoris: Secondary | ICD-10-CM

## 2023-12-21 DIAGNOSIS — I4819 Other persistent atrial fibrillation: Secondary | ICD-10-CM

## 2023-12-21 MED ORDER — DILTIAZEM HCL ER BEADS 180 MG PO CP24: 180.0000 mg | ORAL_CAPSULE | Freq: Every day | ORAL | 3 refills | Status: DC

## 2023-12-21 NOTE — Progress Notes (Signed)
Cardiology Office Note:  .   Date:  12/21/2023  ID:  Kayla, Hendrix 06-Nov-1951, MRN [id number] PCP: Melida Quitter, MD  [facility] Providers Cardiologist:  None Electrophysiologist:  Will Jorja Loa, MD     History of Present Illness: .   Kayla Hendrix is a 73 y.o. female Discussed with the use of AI scribe   History of Present Illness   Kayla Hendrix is a 73 year old female with coronary artery disease and persistent atrial fibrillation who presents for general cardiology consultation. She is accompanied by family.  She has a history of coronary artery disease and persistent atrial fibrillation. She underwent a convergent ablation procedure in January 2024 and a catheter ablation in August 2024, maintaining sinus rhythm since. However, she experienced a rapid heart rate at the end of August 2024, necessitating an emergency room visit and cardioversion. Her current heart rate is low, around 45 bpm, and she experiences shortness of breath, particularly with exertion.  She has sinus bradycardia with first-degree AV block and a CHADs VASc score of five. She previously had a left anterior descending percutaneous coronary intervention and has been on various medications, including metoprolol, which was stopped due to bradycardia, and diltiazem, which was increased to 360 mg but was previously at 240 mg. She is currently on dronedarone and a low dose of metoprolol.  She has experienced epistaxis in the past, leading to a cauterization procedure. She was previously on Plavix but has since discontinued it. She is currently on Eliquis and has been stable with the stent that was placed.  She feels unbalanced at times, especially when turning quickly or bending over, but does not experience dizziness.           Studies Reviewed: .        Results   DIAGNOSTIC EKG: Sinus bradycardia with first-degree AV block     Risk Assessment/Calculations:            Physical Exam:    VS:  BP 132/66   Pulse (!) 45   Ht 5\' 6"  (1.676 m)   Wt 222 lb 3.2 oz (100.8 kg)   SpO2 94%   BMI 35.86 kg/m    Wt Readings from Last 3 Encounters:  12/21/23 222 lb 3.2 oz (100.8 kg)  10/13/23 215 lb 12.8 oz (97.9 kg)  08/26/23 207 lb 6.4 oz (94.1 kg)    GEN: Well nourished, well developed in no acute distress NECK: No JVD; No carotid bruits CARDIAC: brady reg, no murmurs, no rubs, no gallops RESPIRATORY:  Clear to auscultation without rales, wheezing or rhonchi  ABDOMEN: Soft, non-tender, non-distended EXTREMITIES:  No edema; No deformity   ASSESSMENT AND PLAN: .    Assessment and Plan    Persistent Atrial Fibrillation (AFib) Persistent AFib with previous convergent and catheter ablation. Currently in sinus rhythm. Reports bradycardia and dyspnea, likely due to medications (diltiazem, metoprolol, dronedarone). Discussed reducing diltiazem to manage symptoms. Multaq likely most effective for rhythm control. Potential need for further medication adjustments based on follow-up. - Reduce diltiazem ER (Tiazac) from 360 mg to 180 mg - Follow-up in 2-3 weeks for EKG and heart rate check - Consider further reduction or discontinuation of diltiazem if bradycardia persists  Sinus Bradycardia with First Degree AV Block Sinus bradycardia with first degree AV block, likely exacerbated by current medications (diltiazem, metoprolol, dronedarone). - Monitor heart rate and adjust medications as needed  Coronary Artery Disease (CAD) CAD with previous LAD PCI.  Well-managed with no new symptoms. On Eliquis; Plavix discontinued due to epistaxis. - Continue Eliquis - Discontinue Plavix  Fall Risk Reports feeling unbalanced and concerns about falls. Discussed home safety modifications. - Recommend home modifications such as a walk-in shower with a bench  Follow-up - Follow-up appointment in 2-3 weeks with EKG and heart rate check.               Signed, Donato Schultz, MD

## 2023-12-21 NOTE — Patient Instructions (Signed)
 Medication Instructions:  Please discontinue your Plavix . Decrease Tiazac  180 mg a day. Continue all other medications as listed.  *If you need a refill on your cardiac medications before your next appointment, please call your pharmacy*   Follow-Up: At [facility], you and your health needs are our priority.  As part of our continuing mission to provide you with exceptional heart care, we have created designated Provider Care Teams.  These Care Teams include your primary Cardiologist (physician) and Advanced Practice Providers (APPs -  Physician Assistants and Nurse Practitioners) who all work together to provide you with the care you need, when you need it.  We recommend signing up for the patient portal called "MyChart".  Sign up information is provided on this After Visit Summary.  MyChart is used to connect with patients for Virtual Visits (Telemedicine).  Patients are able to view lab/test results, encounter notes, upcoming appointments, etc.  Non-urgent messages can be sent to your provider as well.   To learn more about what you can do with MyChart, go to [email or web address].    Your next appointment:   3 week(s)  Provider:   Lovette Rud, PA-C, Charles Connor, NP, Theotis Flake, PA-C, Slater Duncan, NP, Marlyse Single, PA-C, or Leala Prince, PA-C

## 2023-12-26 DIAGNOSIS — S39012A Strain of muscle, fascia and tendon of lower back, initial encounter: Secondary | ICD-10-CM | POA: Diagnosis not present

## 2024-01-02 ENCOUNTER — Other Ambulatory Visit: Payer: Self-pay | Admitting: Physician Assistant

## 2024-01-02 DIAGNOSIS — M25552 Pain in left hip: Secondary | ICD-10-CM | POA: Diagnosis not present

## 2024-01-06 ENCOUNTER — Other Ambulatory Visit (HOSPITAL_COMMUNITY): Payer: Self-pay | Admitting: Internal Medicine

## 2024-01-15 DIAGNOSIS — M545 Low back pain, unspecified: Secondary | ICD-10-CM | POA: Diagnosis not present

## 2024-01-15 DIAGNOSIS — M7918 Myalgia, other site: Secondary | ICD-10-CM | POA: Diagnosis not present

## 2024-01-21 ENCOUNTER — Ambulatory Visit: Payer: BC Managed Care – PPO | Admitting: Nurse Practitioner

## 2024-01-21 DIAGNOSIS — I129 Hypertensive chronic kidney disease with stage 1 through stage 4 chronic kidney disease, or unspecified chronic kidney disease: Secondary | ICD-10-CM | POA: Diagnosis not present

## 2024-01-21 DIAGNOSIS — E1122 Type 2 diabetes mellitus with diabetic chronic kidney disease: Secondary | ICD-10-CM | POA: Diagnosis not present

## 2024-01-24 ENCOUNTER — Other Ambulatory Visit: Payer: Self-pay | Admitting: Cardiology

## 2024-01-24 DIAGNOSIS — I480 Paroxysmal atrial fibrillation: Secondary | ICD-10-CM

## 2024-01-25 NOTE — Telephone Encounter (Signed)
 Prescription refill request for Eliquis received. Indication:afib Last office visit:2/25 Scr:1.79  10/24 Age: 73 Weight:100.8  kg  Prescription refilled

## 2024-02-06 DIAGNOSIS — M5416 Radiculopathy, lumbar region: Secondary | ICD-10-CM | POA: Diagnosis not present

## 2024-02-10 ENCOUNTER — Other Ambulatory Visit: Payer: Self-pay | Admitting: Physician Assistant

## 2024-02-19 DIAGNOSIS — M545 Low back pain, unspecified: Secondary | ICD-10-CM | POA: Diagnosis not present

## 2024-02-21 NOTE — Progress Notes (Deleted)
 Cardiology Office Note    Patient Name: Kayla Hendrix Date of Encounter: 02/21/2024  Primary Care Provider:  Azalia Leo, MD Primary Cardiologist:  None Primary Electrophysiologist: Will Cortland Ding, MD   Past Medical History    Past Medical History:  Diagnosis Date   Allergy    Anemia    Anxiety    Atrial fibrillation (HCC)    paroxysmal only asa 81 mg, no other blood thinner, the xarelto she was on gave her HAs   Barrett's esophagus 2018   CAD (coronary artery disease)    cath 2006- nonobstructive CAD   Cataract    Depression    Diabetes mellitus without complication (HCC)    Gastric polyp    GERD (gastroesophageal reflux disease)    Glucose intolerance (impaired glucose tolerance)    Hiatal hernia    History of transesophageal echocardiography (TEE) for monitoring    TEE 3/17:  EF 55%, no RWMA, mild plaque in descending aorta, mild MR, mod BAE, normal RVF   HL (hearing loss)    HTN (hypertension)    Hyperlipidemia    Hyperplastic colon polyp    Internal hemorrhoids    Intestinal metaplasia of gastric mucosa    Obesity    OSA (obstructive sleep apnea)    noncompliant with CPAP   Sleep apnea    Spinal stenosis    Tubular adenoma of colon     History of Present Illness  Kayla Hendrix is a 73 y.o. female with PMH of CAD s/p DES to mid LAD, DM, GERD, HLD, HTN, OSA (not on CPAP), paroxysmal atrial fibrillation s/p convergent epicardial ablation with LAA clip 11/27/2022, sinus bradycardia with incomplete RBBB who presents today for 39-month follow-up.  Ms. Krasner was last seen on 12/21/2023 by Dr. Renna Cary to establish cardiology care following transition from Dr. Jacquelynn Matter.  She endorsed bradycardia and dyspnea and had Cardizem decreased from 360 mg to 180 mg.  She denied any chest pain or bouts of tachycardia.  She was advised to follow-up in 2 to 3 weeks to check EKG and heart rate with plan to reduce Cardizem further if bradycardia persist.  Patient denies chest  pain, palpitations, dyspnea, PND, orthopnea, nausea, vomiting, dizziness, syncope, edema, weight gain, or early satiety.   Discussed the use of AI scribe software for clinical note transcription with the patient, who gave verbal consent to proceed.  History of Present Illness    ***Notes: -Last ischemic evaluation:  Review of Systems  Please see the history of present illness.    All other systems reviewed and are otherwise negative except as noted above.  Physical Exam    Wt Readings from Last 3 Encounters:  12/21/23 222 lb 3.2 oz (100.8 kg)  10/13/23 215 lb 12.8 oz (97.9 kg)  08/26/23 207 lb 6.4 oz (94.1 kg)   UX:LKGMW were no vitals filed for this visit.,There is no height or weight on file to calculate BMI. GEN: Well nourished, well developed in no acute distress Neck: No JVD; No carotid bruits Pulmonary: Clear to auscultation without rales, wheezing or rhonchi  Cardiovascular: Normal rate. Regular rhythm. Normal S1. Normal S2.   Murmurs: There is no murmur.  ABDOMEN: Soft, non-tender, non-distended EXTREMITIES:  No edema; No deformity   EKG/LABS/ Recent Cardiac Studies   ECG personally reviewed by me today - ***  Risk Assessment/Calculations:   {Does this patient have ATRIAL FIBRILLATION?:[phone]}      Lab Results  Component Value Date   WBC 8.7 07/10/2023  HGB 10.6 (L) 07/10/2023   HCT 34.2 (L) 07/10/2023   MCV 74.8 (L) 07/10/2023   PLT 419 (H) 07/10/2023   Lab Results  Component Value Date   CREATININE 1.79 (H) 08/26/2023   BUN 18 08/26/2023   NA 142 08/26/2023   K 5.0 08/26/2023   CL 102 08/26/2023   CO2 29 08/26/2023   Lab Results  Component Value Date   CHOL 159 04/10/2012   HDL 34 (L) 04/10/2012   LDLCALC 79 04/10/2012   LDLDIRECT 117.4 08/06/2009   TRIG 230 (H) 04/10/2012   CHOLHDL 6 08/06/2009    Lab Results  Component Value Date   HGBA1C 6.1 (H) 11/13/2022   Assessment & Plan    1.  Coronary artery disease  2.  Sinus  bradycardia  3.  Persistent AF  4.  Essential hypertension  5.      Disposition: Follow-up with None or APP in *** months {Are you ordering a CV Procedure (e.g. stress test, cath, DCCV, TEE, etc)?   Press F2        :[id number]}   Signed, Francene Ing, Retha Cast, NP 02/21/2024, 1:24 PM [facility]

## 2024-02-22 ENCOUNTER — Ambulatory Visit: Admitting: Nurse Practitioner

## 2024-02-22 DIAGNOSIS — I4819 Other persistent atrial fibrillation: Secondary | ICD-10-CM

## 2024-02-22 DIAGNOSIS — I10 Essential (primary) hypertension: Secondary | ICD-10-CM

## 2024-02-22 DIAGNOSIS — I2510 Atherosclerotic heart disease of native coronary artery without angina pectoris: Secondary | ICD-10-CM

## 2024-02-22 DIAGNOSIS — R001 Bradycardia, unspecified: Secondary | ICD-10-CM

## 2024-02-22 NOTE — Progress Notes (Unsigned)
 Cardiology Office Note:  .   Date:  02/23/2024  ID:  Kayla Hendrix, DOB 30-Aug-1951, MRN [id number] PCP: Melida Quitter, MD  [facility] Providers Cardiologist:  None Electrophysiologist:  Will Jorja Loa, MD   History of Present Illness: .   Kayla Hendrix is a 73 y.o. female with a past medical history of CAD s/p DES to mid LAD, DM, GERD, HLD, HTN, OSA (not on CPAP), paroxysmal atrial fibrillation s/p convergent epicardial ablation with LAA clip 11/27/2022, sinus bradycardia with incomplete RBBB who presents today for 87-month follow-up.  Patient  has a history of persistent atrial fibrillation. She underwent a convergent ablation procedure in 11/2022 and a catheter ablation in 06/2023. After her ablation, she did have recurrence of rapid HR and was seen in the ED late in 06/2023 for cardioversion. Since then, she has been maintaining NSR. Currently, she is on diltiazem 180 mg daily, multaq 400 mg BID, metoprolol tartrate 12.5 mg BID, eliquis 5 mg BID. She is followed by Dr. Elberta Fortis and sees the afib clinic.   She also has a history of CAD. Cardiac catheterization in 10/2022 showed 80% stenosis in mid LAD, 30% stenosis in prox-mid RCA. After she underwent her convergent ablation in 11/2022, she was scheduled for coronary stent intervention. Was taken to the cath lab on 04/03/23 with Dr. Eldridge Dace, underwent shockwave intravascular lithotripsy and DES placement to the mid LAD.   Patient was last seen by Dr. Anne Fu on 12/21/23. At that time, she was maintaining NSR. Her HR was a bit low, and her diltiazem dose was reduced.  Today, patient presents for a follow up appointment. Overall feels like she has been doing well from a cardiac standpoint. Reports having occasional chest "twinges". She describes the sensation as brief and not severe, lasting only a few seconds and resolving spontaneously. The twinges are not associated with any difficulty breathing and are not at all similar to the pain  experienced prior to stent placement. She denies chest pain on exertion.   Patient reports chronic shortness of breath, particularly with exertion and when bending over. This has been a long-standing issue. She notes that her shortness of breath seems to worsen when she is leaning forward and she has improvement with better posture, facilitated by the use of a cane. She denies orthopnea, cough   The patient also reports occasional episodes of of having abnormal heart rhythms detected by her home blood pressure monitor. This has only happened a few times and has not happened recently. She denies palpitations or significant changes in heart rate. Reports HR being in the 60s-70s at home. No reports of tachy or bradycardia   The patient also reports chronic lower extremity edema, managed with daily Lasix and spironolactone. She has a history of sleep apnea but is unable to tolerate CPAP therapy. She often sleeps sitting up in a chair. Her other chronic conditions include diabetes, managed with Ozempic, and hyperlipidemia, managed with Lipitor twice weekly. She reports back pain with higher doses of Lipitor.  ROS: Per HPI   Studies Reviewed: .   Cardiac Studies & Procedures   ______________________________________________________________________________________________ CARDIAC CATHETERIZATION  CARDIAC CATHETERIZATION 04/03/2023  Conclusion   Prox RCA to Mid RCA lesion is 30% stenosed.   Mid LAD lesion is 80% stenosed.  Heavily calcified.  Proximal bend required buddy wire to help deliver equipment.   After shockwave intravascular lithotripsy, a drug-eluting stent was successfully placed using a SYNERGY XD 3.0X16 postdilated to greater than 3.25 mm.  It was optimized with intravascular ultrasound.   Post intervention, there is a 5% residual stenosis.  Successful intravascular lithotripsy and stent placement to the mid LAD.  Continue clopidogrel.  Restart Eliquis tomorrow.  Will hold off on aspirin to  minimize bleeding risk.  Patient is back in AFib with symptoms and intermittent high heart rate.  Plan to admit to manage anticoagulation post cath and AFib.  She was quite upset that AFib has recurred a few months after convergent procedure.  Results conveyed to the son.  Findings Coronary Findings Diagnostic  Dominance: Right  Left Anterior Descending The vessel exhibits minimal luminal irregularities. Mid LAD lesion is 80% stenosed.  Left Circumflex The vessel exhibits minimal luminal irregularities.  Right Coronary Artery The vessel exhibits minimal luminal irregularities. Prox RCA to Mid RCA lesion is 30% stenosed.  Intervention  Mid LAD lesion Stent CATH LAUNCHER 6FR EBU 3.75 guide catheter was inserted. Lesion crossed with guidewire using a WIRE RUNTHROUGH .[id number]. Pre-stent angioplasty was performed using a BALLN EMERGE MR [id number]. A drug-eluting stent was successfully placed using a SYNERGY XD 3.0X16. Post-stent angioplasty was performed using a BALL SAPPHIRE NC24 3.25X10. Shockwave 3.0 x 12 used before and after stent placement.  First balloon ruptured after 20 pulses.  Second balloon was used.  IC NTG given Post-Intervention Lesion Assessment The intervention was successful. Pre-interventional TIMI flow is 3. Post-intervention TIMI flow is 3. No complications occurred at this lesion. Ultrasound (IVUS) was performed on the lesion post PCI using a CATH OPTICROSS HD. Stent well apposed. There is a 5% residual stenosis post intervention.   CARDIAC CATHETERIZATION  CARDIAC CATHETERIZATION 11/06/2022  Conclusion   Mid LAD lesion is 80% stenosed.  Focal lesion, calcified.   Prox RCA to Mid RCA lesion is 30% stenosed.   The left ventricular systolic function is normal.   LV end diastolic pressure is normal.   The left ventricular ejection fraction is 55-65% by visual estimate.   There is no aortic valve stenosis.  Significant mid LAD lesion.  Persistent atrial  fibrillation during catheterization as well.  Given that she has a convergent procedure scheduled, intervention was not performed to leave the option open for convergent procedure first, followed by PCI.  Will discuss with surgical team.  Findings Coronary Findings Diagnostic  Dominance: Right  Left Anterior Descending The vessel exhibits minimal luminal irregularities. Mid LAD lesion is 80% stenosed.  Left Circumflex The vessel exhibits minimal luminal irregularities.  Right Coronary Artery The vessel exhibits minimal luminal irregularities. Prox RCA to Mid RCA lesion is 30% stenosed.  Intervention  No interventions have been documented.     ECHOCARDIOGRAM  ECHOCARDIOGRAM COMPLETE 09/24/2020  Narrative ECHOCARDIOGRAM REPORT    Patient Name:   AUDRIELLA BLAKELEY Date of Exam: 09/24/2020 Medical Rec #:  [id number]       Height:       66.0 in Accession #:    [id number]      Weight:       233.2 lb Date of Birth:  12/19/1950       BSA:          2.135 m Patient Age:    69 years        BP:           140/64 mmHg Patient Gender: F               HR:           89 bpm. Exam Location:  Outpatient  Procedure:  2D Echo  Indications:    Atrial Fibrillation I48.91  History:        Patient has prior history of Echocardiogram examinations, most recent 08/10/2017. CAD, Arrythmias:Atrial Fibrillation; Risk Factors:Hypertension, Dyslipidemia and Diabetes.  Sonographer:    Thurman Coyer RDCS (AE) Referring Phys: [phone] DONNA C CARROLL  IMPRESSIONS   1. Left ventricular ejection fraction, by estimation, is 60 to 65%. The left ventricle has normal function. The left ventricle has no regional wall motion abnormalities. There is mild concentric left ventricular hypertrophy. Left ventricular diastolic function could not be evaluated. 2. Right ventricular systolic function is normal. The right ventricular size is normal. 3. Left atrial size was mildly dilated. 4. The mitral valve is  normal in structure. Mild mitral valve regurgitation. No evidence of mitral stenosis. 5. The aortic valve is normal in structure. Aortic valve regurgitation is not visualized. No aortic stenosis is present. 6. The inferior vena cava is normal in size with greater than 50% respiratory variability, suggesting right atrial pressure of 3 mmHg.  FINDINGS Left Ventricle: Left ventricular ejection fraction, by estimation, is 60 to 65%. The left ventricle has normal function. The left ventricle has no regional wall motion abnormalities. The left ventricular internal cavity size was normal in size. There is mild concentric left ventricular hypertrophy. Left ventricular diastolic function could not be evaluated due to atrial fibrillation. Left ventricular diastolic function could not be evaluated.  Right Ventricle: The right ventricular size is normal. No increase in right ventricular wall thickness. Right ventricular systolic function is normal.  Left Atrium: Left atrial size was mildly dilated.  Right Atrium: Right atrial size was normal in size.  Pericardium: There is no evidence of pericardial effusion.  Mitral Valve: The mitral valve is normal in structure. Mild mitral valve regurgitation. No evidence of mitral valve stenosis.  Tricuspid Valve: The tricuspid valve is normal in structure. Tricuspid valve regurgitation is mild . No evidence of tricuspid stenosis.  Aortic Valve: The aortic valve is normal in structure. Aortic valve regurgitation is not visualized. No aortic stenosis is present.  Pulmonic Valve: The pulmonic valve was normal in structure. Pulmonic valve regurgitation is trivial. No evidence of pulmonic stenosis.  Aorta: The aortic root is normal in size and structure.  Venous: The inferior vena cava is normal in size with greater than 50% respiratory variability, suggesting right atrial pressure of 3 mmHg.  IAS/Shunts: No atrial level shunt detected by color flow  Doppler.   LEFT VENTRICLE PLAX 2D LVIDd:         4.50 cm LVIDs:         3.20 cm LV PW:         1.10 cm LV IVS:        1.10 cm LVOT diam:     1.90 cm LV SV:         46 LV SV Index:   22 LVOT Area:     2.84 cm   RIGHT VENTRICLE RV S prime:     11.10 cm/s TAPSE (M-mode): 1.6 cm  LEFT ATRIUM             Index       RIGHT ATRIUM           Index LA diam:        3.80 cm 1.78 cm/m  RA Area:     19.20 cm LA Vol (A2C):   66.3 ml 31.06 ml/m RA Volume:   50.80 ml  23.80 ml/m LA Vol (A4C):  68.4 ml 32.04 ml/m LA Biplane Vol: 70.4 ml 32.98 ml/m AORTIC VALVE LVOT Vmax:   71.70 cm/s LVOT Vmean:  50.800 cm/s LVOT VTI:    0.162 m  AORTA Ao Root diam: 3.00 cm   SHUNTS Systemic VTI:  0.16 m Systemic Diam: 1.90 cm  Tobias Alexander MD Electronically signed by Tobias Alexander MD Signature Date/Time: 09/25/2020/9:58:57 AM    Final   TEE  ECHO TEE 01/09/2022  Narrative TRANSESOPHOGEAL ECHO REPORT    Patient Name:   CARLINA DERKS Date of Exam: 01/09/2022 Medical Rec #:  [id number]       Height:       66.0 in Accession #:    [id number]      Weight:       210.0 lb Date of Birth:  11-20-1950       BSA:          2.042 m Patient Age:    70 years        BP:           101/70 mmHg Patient Gender: F               HR:           115 bpm. Exam Location:  Inpatient  Procedure: Transesophageal Echo, Color Doppler and Cardiac Doppler  Indications:     Pre Ablation, Atrial Fibrillation  History:         Patient has prior history of Echocardiogram examinations, most recent 09/25/2020. CAD, Arrythmias:Atrial Fibrillation; Risk Factors:Sleep Apnea, Dyslipidemia, Hypertension and Diabetes.  Sonographer:     Eulah Pont RDCS Referring Phys:  [id number] V[address] Fells SKAINS Diagnosing Phys: Donato Schultz MD  PROCEDURE: After discussion of the risks and benefits of a TEE, an informed consent was obtained from the patient. The transesophogeal probe was passed without difficulty through the esophogus  of the patient. Sedation performed by different physician. The patient was monitored while under deep sedation. Anesthestetic sedation was provided intravenously by Anesthesiology: 140mg  of Propofol, 60mg  of Lidocaine. The patient's vital signs; including heart rate, blood pressure, and oxygen saturation; remained stable throughout the procedure. The patient developed no complications during the procedure.  IMPRESSIONS   1. Left ventricular ejection fraction, by estimation, is 60 to 65%. The left ventricle has normal function. The left ventricle has no regional wall motion abnormalities. 2. Right ventricular systolic function is normal. The right ventricular size is normal. 3. No left atrial/left atrial appendage thrombus was detected. 4. The mitral valve is normal in structure. Trivial mitral valve regurgitation. No evidence of mitral stenosis. 5. The aortic valve is normal in structure. Aortic valve regurgitation is not visualized. No aortic stenosis is present. 6. The inferior vena cava is normal in size with greater than 50% respiratory variability, suggesting right atrial pressure of 3 mmHg.  Conclusion(s)/Recommendation(s): Normal biventricular function without evidence of hemodynamically significant valvular heart disease.  FINDINGS Left Ventricle: Left ventricular ejection fraction, by estimation, is 60 to 65%. The left ventricle has normal function. The left ventricle has no regional wall motion abnormalities. The left ventricular internal cavity size was normal in size. There is no left ventricular hypertrophy.  Right Ventricle: The right ventricular size is normal. No increase in right ventricular wall thickness. Right ventricular systolic function is normal.  Left Atrium: Left atrial size was normal in size. No left atrial/left atrial appendage thrombus was detected.  Right Atrium: Right atrial size was normal in size.  Pericardium: There is no evidence of pericardial  effusion.  Mitral Valve: The mitral valve is normal in structure. Trivial mitral valve regurgitation. No evidence of mitral valve stenosis.  Tricuspid Valve: The tricuspid valve is normal in structure. Tricuspid valve regurgitation is mild . No evidence of tricuspid stenosis.  Aortic Valve: The aortic valve is normal in structure. Aortic valve regurgitation is not visualized. No aortic stenosis is present.  Pulmonic Valve: The pulmonic valve was normal in structure. Pulmonic valve regurgitation is not visualized. No evidence of pulmonic stenosis.  Aorta: The aortic root is normal in size and structure.  Venous: The inferior vena cava is normal in size with greater than 50% respiratory variability, suggesting right atrial pressure of 3 mmHg.  IAS/Shunts: No atrial level shunt detected by color flow Doppler.  Donato Schultz MD Electronically signed by Donato Schultz MD Signature Date/Time: 01/09/2022/12:01:09 PM    Final        ______________________________________________________________________________________________      Risk Assessment/Calculations:    CHA2DS2-VASc Score = 6   This indicates a 9.7% annual risk of stroke. The patient's score is based upon: CHF History: 1 HTN History: 1 Diabetes History: 1 Stroke History: 0 Vascular Disease History: 1 Age Score: 1 Gender Score: 1    Physical Exam:   VS:  BP (!) 108/58 (BP Location: Left Arm, Patient Position: Sitting, Cuff Size: Large)   Pulse 64   Wt 222 lb 3.2 oz (100.8 kg)   SpO2 96%   BMI 35.86 kg/m    Wt Readings from Last 3 Encounters:  02/23/24 222 lb 3.2 oz (100.8 kg)  12/21/23 222 lb 3.2 oz (100.8 kg)  10/13/23 215 lb 12.8 oz (97.9 kg)    GEN: Well nourished, well developed in no acute distress. Sitting comfortably in the chair  NECK: No JVD  CARDIAC:  RRR, no murmurs, rubs, gallops RESPIRATORY:  Clear to auscultation without rales, wheezing or rhonchi. Normal WOB on room air  ABDOMEN: Soft, non-tender,  non-distended EXTREMITIES:  Trace edema in BLE. No deformity   ASSESSMENT AND PLAN: .    Persistent Atrial Fibrillation s/p ablation  Sinus bradycardia  - Patient previously underwent convergent ablation in 11/2022 and catheter ablation in 06/2023  - When last seen in 12/2023, patient's EKG showed sinus bradycardia with first degree HB. Her diltiazem was reduced from 360 mg to 180 mg daily  - She has been monitoring her HR at home and reports it has been in the 60s-70s. She denies palpitations. Had a few instances where her BP cuff told her that her HR was irregular, but this has not happened recently  - HR regular on exam today - Continue diltiazem 180 mg daily - continue metoprolol tartrate 12.5 mg daily  - Continue Multaq 400 mg BID  - K 4.7 in 01/2024 - Continue eliquis 5 mg BID. Denies bleeding on eliquis   CAD  - Underwent intravascular lithotripsy and DES placement to the mid LAD in 03/2023  - She reports having occasional "twinges" of chest pain that only last a few seconds. No chest pain on exertion. No chest pain similar to what she felt prior to having stent placed in the past  - Chest pain is atypical- no plans for ischemic workup at this time  - Continue eliquis 5 mg BID.   - Continue metoprolol tartrate 12.5 mg every AM  - continue lipitor 40 mg two times per week   HLD  - Lipid panel from 08/2023 showed LDL 125, HDL 46, triglycerides 128, total cholesterol 210  -  Currently on lipitor 40 mg twice per week. Was unable to tolerate higher doses due to back pain  - Referred to lipid clinic to discuss [id number]   Chronic Diastolic Heart Failure  - TEE in 01/2022 showed normal biventricular function without evidence of hemodynamically significant valvular disease - Patient reports chronic lower extremity swelling that has been stable  - Continue lasix 40 mg daily. Instructed her to take an extra 40 mg of lasix as needed for worsening lower extremity edema, weight gain  - Continue  spironolactone 12.5 mg daily  - K 4.7 in 01/2024. Creatinine stable at 1.79 in 08/2023  - Ordered BMP for med monitoring   HTN  - BP well controlled - Continue current regiment   Obesity  Dyspnea  OSA  - Patient has BMI 35.86 - Previously had positive sleep study for OSA, but could not tolerate multiple different types of face mask - She has some chronic dyspnea, seems to be worse if she is bending forward/slouching. If she strands straight up (like when walking with a cane), breathing improves  - Encouraged her to increase physical activity as tolerated and work to improve posture to help with SOB  - Patient is on ozempic, prescribed by PCP    Dispo: Follow up in 3 months with APP   Signed, Debria Fang, PA-C

## 2024-02-23 ENCOUNTER — Encounter: Payer: Self-pay | Admitting: Cardiology

## 2024-02-23 ENCOUNTER — Ambulatory Visit: Attending: Cardiology | Admitting: Cardiology

## 2024-02-23 ENCOUNTER — Telehealth: Payer: Self-pay

## 2024-02-23 VITALS — BP 108/58 | HR 64 | Wt 222.2 lb

## 2024-02-23 DIAGNOSIS — I2510 Atherosclerotic heart disease of native coronary artery without angina pectoris: Secondary | ICD-10-CM | POA: Diagnosis not present

## 2024-02-23 DIAGNOSIS — E7800 Pure hypercholesterolemia, unspecified: Secondary | ICD-10-CM | POA: Diagnosis not present

## 2024-02-23 DIAGNOSIS — I4819 Other persistent atrial fibrillation: Secondary | ICD-10-CM | POA: Diagnosis not present

## 2024-02-23 DIAGNOSIS — I5032 Chronic diastolic (congestive) heart failure: Secondary | ICD-10-CM | POA: Diagnosis not present

## 2024-02-23 DIAGNOSIS — I10 Essential (primary) hypertension: Secondary | ICD-10-CM

## 2024-02-23 DIAGNOSIS — G4733 Obstructive sleep apnea (adult) (pediatric): Secondary | ICD-10-CM

## 2024-02-23 DIAGNOSIS — E669 Obesity, unspecified: Secondary | ICD-10-CM

## 2024-02-23 DIAGNOSIS — R0602 Shortness of breath: Secondary | ICD-10-CM

## 2024-02-23 MED ORDER — FUROSEMIDE 40 MG PO TABS: 40.0000 mg | ORAL_TABLET | Freq: Every day | ORAL | 3 refills | Status: AC

## 2024-02-23 NOTE — Telephone Encounter (Signed)
 Patient needs to be brought back in for a Bmet, as we were unable to see the results on [facility] or see anything from PCP

## 2024-02-23 NOTE — Patient Instructions (Signed)
 Medication Instructions:  No changes *If you need a refill on your cardiac medications before your next appointment, please call your pharmacy*  Lab Work: No labs If you have labs (blood work) drawn today and your tests are completely normal, you will receive your results only by: MyChart Message (if you have MyChart) OR A paper copy in the mail If you have any lab test that is abnormal or we need to change your treatment, we will call you to review the results.  Testing/Procedures: No testing  Follow-Up: At [facility], you and your health needs are our priority.  As part of our continuing mission to provide you with exceptional heart care, our providers are all part of one team.  This team includes your primary Cardiologist (physician) and Advanced Practice Providers or APPs (Physician Assistants and Nurse Practitioners) who all work together to provide you with the care you need, when you need it.  Your next appointment:   3 month(s)  Provider:   Liane Redman, PA-C     We recommend signing up for the patient portal called "MyChart".  Sign up information is provided on this After Visit Summary.  MyChart is used to connect with patients for Virtual Visits (Telemedicine).  Patients are able to view lab/test results, encounter notes, upcoming appointments, etc.  Non-urgent messages can be sent to your provider as well.   To learn more about what you can do with MyChart, go to [email or web address].   Other Instructions We will reach out to schedule a Pharm-D appointment with our lipid clinic   1st Floor: - Lobby - Registration  - Pharmacy  - Lab - Cafe  2nd Floor: - PV Lab - Diagnostic Testing (echo, CT, nuclear med)  3rd Floor: - Vacant  4th Floor: - TCTS (cardiothoracic surgery) - AFib Clinic - Structural Heart Clinic - Vascular Surgery  - Vascular Ultrasound  5th Floor: - [facility] (general and EP) - Clinical Pharmacy for coumadin,  hypertension, lipid, weight-loss medications, and med management appointments    Valet parking services will be available as well.

## 2024-03-30 ENCOUNTER — Other Ambulatory Visit: Payer: Self-pay | Admitting: Cardiology

## 2024-04-05 ENCOUNTER — Ambulatory Visit (HOSPITAL_COMMUNITY)
Admission: RE | Admit: 2024-04-05 | Discharge: 2024-04-05 | Disposition: A | Discharge status: Home / Self Care | Source: Ambulatory Visit | Attending: Internal Medicine | Admitting: Internal Medicine

## 2024-04-05 VITALS — BP 152/64 | HR 72 | Ht 66.0 in | Wt 225.6 lb

## 2024-04-05 DIAGNOSIS — D6869 Other thrombophilia: Secondary | ICD-10-CM | POA: Diagnosis not present

## 2024-04-05 DIAGNOSIS — I4819 Other persistent atrial fibrillation: Secondary | ICD-10-CM | POA: Diagnosis not present

## 2024-04-05 DIAGNOSIS — Z5181 Encounter for therapeutic drug level monitoring: Secondary | ICD-10-CM | POA: Diagnosis not present

## 2024-04-05 DIAGNOSIS — I4891 Unspecified atrial fibrillation: Secondary | ICD-10-CM

## 2024-04-05 DIAGNOSIS — Z79899 Other long term (current) drug therapy: Secondary | ICD-10-CM
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
## 2024-04-12 ENCOUNTER — Ambulatory Visit: Admitting: Pharmacist

## 2024-05-09 DIAGNOSIS — M47816 Spondylosis without myelopathy or radiculopathy, lumbar region: Secondary | ICD-10-CM | POA: Diagnosis not present
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
## 2024-05-24 DIAGNOSIS — I1310 Hypertensive heart and chronic kidney disease without heart failure, with stage 1 through stage 4 chronic kidney disease, or unspecified chronic kidney disease: Secondary | ICD-10-CM | POA: Diagnosis not present

## 2024-05-24 DIAGNOSIS — E1122 Type 2 diabetes mellitus with diabetic chronic kidney disease: Secondary | ICD-10-CM | POA: Diagnosis not present

## 2024-06-02 ENCOUNTER — Ambulatory Visit: Attending: Cardiology | Admitting: Cardiology

## 2024-06-02 ENCOUNTER — Encounter: Payer: Self-pay | Admitting: Cardiology

## 2024-06-02 VITALS — BP 108/58 | HR 48 | Ht 66.0 in | Wt 222.4 lb

## 2024-06-02 DIAGNOSIS — I10 Essential (primary) hypertension: Secondary | ICD-10-CM

## 2024-06-02 DIAGNOSIS — I4891 Unspecified atrial fibrillation: Secondary | ICD-10-CM | POA: Diagnosis not present

## 2024-06-02 DIAGNOSIS — G4733 Obstructive sleep apnea (adult) (pediatric): Secondary | ICD-10-CM

## 2024-06-02 DIAGNOSIS — E7800 Pure hypercholesterolemia, unspecified: Secondary | ICD-10-CM

## 2024-06-02 DIAGNOSIS — I5032 Chronic diastolic (congestive) heart failure: Secondary | ICD-10-CM

## 2024-06-02 DIAGNOSIS — I2510 Atherosclerotic heart disease of native coronary artery without angina pectoris: Secondary | ICD-10-CM

## 2024-06-02 MED ORDER — DILTIAZEM HCL ER BEADS 120 MG PO CP24: 120.0000 mg | ORAL_CAPSULE | Freq: Every day | ORAL | 3 refills | Status: AC

## 2024-06-02 NOTE — Patient Instructions (Signed)
 Medication Instructions:  Decrease diltiazem  to 120 mg once a day  *If you need a refill on your cardiac medications before your next appointment, please call your pharmacy*  Lab Work: Today we are going to draw a Bmet If you have labs (blood work) drawn today and your tests are completely normal, you will receive your results only by: MyChart Message (if you have MyChart) OR A paper copy in the mail If you have any lab test that is abnormal or we need to change your treatment, we will call you to review the results.  Testing/Procedures: No testing  Follow-Up: At [facility], you and your health needs are our priority.  As part of our continuing mission to provide you with exceptional heart care, our providers are all part of one team.  This team includes your primary Cardiologist (physician) and Advanced Practice Providers or APPs (Physician Assistants and Nurse Practitioners) who all work together to provide you with the care you need, when you need it.  Your next appointment:   3 month(s)  Provider:   Rollo Louder, PA-C  We recommend signing up for the patient portal called MyChart.  Sign up information is provided on this After Visit Summary.  MyChart is used to connect with patients for Virtual Visits (Telemedicine).  Patients are able to view lab/test results, encounter notes, upcoming appointments, etc.  Non-urgent messages can be sent to your provider as well.   To learn more about what you can do with MyChart, go to [email or web address].

## 2024-06-03 ENCOUNTER — Ambulatory Visit: Payer: Self-pay | Admitting: Cardiology

## 2024-06-03 LAB — BASIC METABOLIC PANEL WITH GFR
BUN/Creatinine Ratio: 12 (ref 12–28)
BUN: 18 mg/dL (ref 8–27)
CO2: 26 mmol/L (ref 20–29)
Calcium: 8.4 mg/dL — ABNORMAL LOW (ref 8.7–10.3)
Chloride: 100 mmol/L (ref 96–106)
Creatinine, Ser: 1.55 mg/dL — ABNORMAL HIGH (ref 0.57–1.00)
Glucose: 80 mg/dL (ref 70–99)
Potassium: 3.8 mmol/L (ref 3.5–5.2)
Sodium: 142 mmol/L (ref 134–144)
eGFR: 35 mL/min/1.73 — ABNORMAL LOW (ref >59)

## 2024-06-07 DIAGNOSIS — M47816 Spondylosis without myelopathy or radiculopathy, lumbar region: Secondary | ICD-10-CM | POA: Diagnosis not present

## 2024-06-09 NOTE — Telephone Encounter (Signed)
 Called patient advised of below they verbalized understanding.

## 2024-06-09 NOTE — Telephone Encounter (Signed)
-----   Message from Rollo FABIENE Louder sent at 06/03/2024  8:08 AM EDT ----- Please tell patient that her lab work showed stable kidney function and normal potassium on current medications. Her calcium  is a bit low, seems to be a chronic issue. She should follow up with her  PCP nonurgently to make sure this is being monitored   With her low HR, stop metoprolol  tartrate 12.5 mg daily. Change it to as needed for palpitations, sustained HR >100   Thanks KJ  ----- Message ----- From: Interface, [facility] Lab Results In Sent: 06/03/2024   1:35 AM EDT To: Rollo JONELLE Louder, PA-C

## 2024-06-27 ENCOUNTER — Ambulatory Visit: Admitting: Pharmacist Clinician (PhC)/ Clinical Pharmacy Specialist

## 2024-06-28 DIAGNOSIS — M47816 Spondylosis without myelopathy or radiculopathy, lumbar region: Secondary | ICD-10-CM | POA: Diagnosis not present

## 2024-07-29 DIAGNOSIS — M47816 Spondylosis without myelopathy or radiculopathy, lumbar region: Secondary | ICD-10-CM | POA: Diagnosis not present

## 2024-08-10 ENCOUNTER — Other Ambulatory Visit: Payer: Self-pay

## 2024-08-11 MED ORDER — MULTAQ 400 MG PO TABS: 400.0000 mg | ORAL_TABLET | Freq: Two times a day (BID) | ORAL | 2 refills | Status: AC

## 2024-08-23 NOTE — Progress Notes (Signed)
 Cardiology Office Note   Date:  09/06/2024  ID:  Kayla Hendrix, Kayla Hendrix May 14, 1951, MRN [id number] PCP: Stephane Leita DEL, MD  [facility] Providers Cardiologist:  None Electrophysiologist:  Will Gladis Norton, MD    History of Present Illness Kayla Hendrix is a 73 y.o. female with a past medical history of CAD s/p DES to mid LAD, DM, GERD, HLD, HTN, OSA (not on CPAP), paroxysmal atrial fibrillation s/p convergent epicardial ablation with LAA clip 11/27/2022, sinus bradycardia with incomplete RBBB who presents today for 70-month follow-up.   Patient  has a history of persistent atrial fibrillation. She underwent a convergent ablation procedure in 11/2022 and a catheter ablation in 06/2023. After her ablation, she did have recurrence of rapid HR and was seen in the ED late in 06/2023 for cardioversion. Since then, she has been maintaining NSR. Currently, she is on diltiazem  180 mg daily, multaq  400 mg BID, metoprolol  tartrate 12.5 mg BID, eliquis  5 mg BID. She is followed by Dr. Norton and sees the afib clinic.    She also has a history of CAD. Cardiac catheterization in 10/2022 showed 80% stenosis in mid LAD, 30% stenosis in prox-mid RCA. After she underwent her convergent ablation in 11/2022, she was scheduled for coronary stent intervention. Was taken to the cath lab on 04/03/23 with Dr. Dann, underwent shockwave intravascular lithotripsy and DES placement to the mid LAD.    Patient was last seen by Dr. Jeffrie on 12/21/23. At that time, she was maintaining NSR. Her HR was a bit low, and her diltiazem  dose was reduced. I saw her in clinic 02/2024. AT that time, patient had been doing well. HR was regular and in the 60s-70s. Remained on diltiazem , metoprolol , multaq , eliquis . Was seen in the afib clinic 03/2024. EKG showed stable intervals and she remained on mutlaq   I saw patient in clinic on 7/24.  At that time, patient had a lot of back pain.  Physical activity was limited because of back  pain.  Reported A-fib being well-controlled and only had one 4-hour episode of A-fib recently.  Today, patient presents for follow-up appointment.  She has been doing well from a cardiac standpoint and denies specific concerns or complaints.  Reports that her A-fib has been well-controlled and she feels she is only had a few hours of A-fib since last being seen.  Denies dizziness, syncope, near syncope.  She has rare twinges of chest pain.  These are located under her left breast and only last seconds at a time.  She denies having any chest pain with exertion.  She stays active by walking.  She is able to do this without chest pain.  She has chronic shortness of breath which has been stable over time.   Studies Reviewed Cardiac Studies & Procedures   ______________________________________________________________________________________________ CARDIAC CATHETERIZATION  CARDIAC CATHETERIZATION 04/03/2023  Conclusion   Prox RCA to Mid RCA lesion is 30% stenosed.   Mid LAD lesion is 80% stenosed.  Heavily calcified.  Proximal bend required buddy wire to help deliver equipment.   After shockwave intravascular lithotripsy, a drug-eluting stent was successfully placed using a SYNERGY XD 3.0X16 postdilated to greater than 3.25 mm.  It was optimized with intravascular ultrasound.   Post intervention, there is a 5% residual stenosis.  Successful intravascular lithotripsy and stent placement to the mid LAD.  Continue clopidogrel .  Restart Eliquis  tomorrow.  Will hold off on aspirin  to minimize bleeding risk.  Patient is back in AFib with symptoms  and intermittent high heart rate.  Plan to admit to manage anticoagulation post cath and AFib.  She was quite upset that AFib has recurred a few months after convergent procedure.  Results conveyed to the son.  Findings Coronary Findings Diagnostic  Dominance: Right  Left Anterior Descending The vessel exhibits minimal luminal irregularities. Mid LAD lesion  is 80% stenosed.  Left Circumflex The vessel exhibits minimal luminal irregularities.  Right Coronary Artery The vessel exhibits minimal luminal irregularities. Prox RCA to Mid RCA lesion is 30% stenosed.  Intervention  Mid LAD lesion Stent CATH LAUNCHER 6FR EBU 3.75 guide catheter was inserted. Lesion crossed with guidewire using a WIRE RUNTHROUGH .[id number]. Pre-stent angioplasty was performed using a BALLN EMERGE MR [id number]. A drug-eluting stent was successfully placed using a SYNERGY XD 3.0X16. Post-stent angioplasty was performed using a BALL SAPPHIRE NC24 3.25X10. Shockwave 3.0 x 12 used before and after stent placement.  First balloon ruptured after 20 pulses.  Second balloon was used.  IC NTG given Post-Intervention Lesion Assessment The intervention was successful. Pre-interventional TIMI flow is 3. Post-intervention TIMI flow is 3. No complications occurred at this lesion. Ultrasound (IVUS) was performed on the lesion post PCI using a CATH OPTICROSS HD. Stent well apposed. There is a 5% residual stenosis post intervention.   CARDIAC CATHETERIZATION  CARDIAC CATHETERIZATION 11/06/2022  Conclusion   Mid LAD lesion is 80% stenosed.  Focal lesion, calcified.   Prox RCA to Mid RCA lesion is 30% stenosed.   The left ventricular systolic function is normal.   LV end diastolic pressure is normal.   The left ventricular ejection fraction is 55-65% by visual estimate.   There is no aortic valve stenosis.  Significant mid LAD lesion.  Persistent atrial fibrillation during catheterization as well.  Given that she has a convergent procedure scheduled, intervention was not performed to leave the option open for convergent procedure first, followed by PCI.  Will discuss with surgical team.  Findings Coronary Findings Diagnostic  Dominance: Right  Left Anterior Descending The vessel exhibits minimal luminal irregularities. Mid LAD lesion is 80% stenosed.  Left Circumflex The  vessel exhibits minimal luminal irregularities.  Right Coronary Artery The vessel exhibits minimal luminal irregularities. Prox RCA to Mid RCA lesion is 30% stenosed.  Intervention  No interventions have been documented.     ECHOCARDIOGRAM  ECHOCARDIOGRAM COMPLETE 09/24/2020  Narrative ECHOCARDIOGRAM REPORT    Patient Name:   PIETRINA JAGODZINSKI Date of Exam: 09/24/2020 Medical Rec #:  [id number]       Height:       66.0 in Accession #:    [id number]      Weight:       233.2 lb Date of Birth:  Oct 08, 1951       BSA:          2.135 m Patient Age:    69 years        BP:           140/64 mmHg Patient Gender: F               HR:           89 bpm. Exam Location:  Outpatient  Procedure: 2D Echo  Indications:    Atrial Fibrillation I48.91  History:        Patient has prior history of Echocardiogram examinations, most recent 08/10/2017. CAD, Arrythmias:Atrial Fibrillation; Risk Factors:Hypertension, Dyslipidemia and Diabetes.  Sonographer:    Augustin Seals RDCS (AE) Referring Phys: [phone] DONNA C CARROLL  IMPRESSIONS   1. Left ventricular ejection fraction, by estimation, is 60 to 65%. The left ventricle has normal function. The left ventricle has no regional wall motion abnormalities. There is mild concentric left ventricular hypertrophy. Left ventricular diastolic function could not be evaluated. 2. Right ventricular systolic function is normal. The right ventricular size is normal. 3. Left atrial size was mildly dilated. 4. The mitral valve is normal in structure. Mild mitral valve regurgitation. No evidence of mitral stenosis. 5. The aortic valve is normal in structure. Aortic valve regurgitation is not visualized. No aortic stenosis is present. 6. The inferior vena cava is normal in size with greater than 50% respiratory variability, suggesting right atrial pressure of 3 mmHg.  FINDINGS Left Ventricle: Left ventricular ejection fraction, by estimation, is 60 to 65%. The  left ventricle has normal function. The left ventricle has no regional wall motion abnormalities. The left ventricular internal cavity size was normal in size. There is mild concentric left ventricular hypertrophy. Left ventricular diastolic function could not be evaluated due to atrial fibrillation. Left ventricular diastolic function could not be evaluated.  Right Ventricle: The right ventricular size is normal. No increase in right ventricular wall thickness. Right ventricular systolic function is normal.  Left Atrium: Left atrial size was mildly dilated.  Right Atrium: Right atrial size was normal in size.  Pericardium: There is no evidence of pericardial effusion.  Mitral Valve: The mitral valve is normal in structure. Mild mitral valve regurgitation. No evidence of mitral valve stenosis.  Tricuspid Valve: The tricuspid valve is normal in structure. Tricuspid valve regurgitation is mild . No evidence of tricuspid stenosis.  Aortic Valve: The aortic valve is normal in structure. Aortic valve regurgitation is not visualized. No aortic stenosis is present.  Pulmonic Valve: The pulmonic valve was normal in structure. Pulmonic valve regurgitation is trivial. No evidence of pulmonic stenosis.  Aorta: The aortic root is normal in size and structure.  Venous: The inferior vena cava is normal in size with greater than 50% respiratory variability, suggesting right atrial pressure of 3 mmHg.  IAS/Shunts: No atrial level shunt detected by color flow Doppler.   LEFT VENTRICLE PLAX 2D LVIDd:         4.50 cm LVIDs:         3.20 cm LV PW:         1.10 cm LV IVS:        1.10 cm LVOT diam:     1.90 cm LV SV:         46 LV SV Index:   22 LVOT Area:     2.84 cm   RIGHT VENTRICLE RV S prime:     11.10 cm/s TAPSE (M-mode): 1.6 cm  LEFT ATRIUM             Index       RIGHT ATRIUM           Index LA diam:        3.80 cm 1.78 cm/m  RA Area:     19.20 cm LA Vol (A2C):   66.3 ml 31.06 ml/m  RA Volume:   50.80 ml  23.80 ml/m LA Vol (A4C):   68.4 ml 32.04 ml/m LA Biplane Vol: 70.4 ml 32.98 ml/m AORTIC VALVE LVOT Vmax:   71.70 cm/s LVOT Vmean:  50.800 cm/s LVOT VTI:    0.162 m  AORTA Ao Root diam: 3.00 cm   SHUNTS Systemic VTI:  0.16 m Systemic Diam: 1.90 cm  Leim  Maranda MD Electronically signed by Leim Maranda MD Signature Date/Time: 09/25/2020/9:58:57 AM    Final   TEE  ECHO TEE 01/09/2022  Narrative TRANSESOPHOGEAL ECHO REPORT    Patient Name:   NAN MAYA Date of Exam: 01/09/2022 Medical Rec #:  [id number]       Height:       66.0 in Accession #:    [id number]      Weight:       210.0 lb Date of Birth:  [birth year]       BSA:          2.042 m Patient Age:    70 years        BP:           101/70 mmHg Patient Gender: F               HR:           115 bpm. Exam Location:  Inpatient  Procedure: Transesophageal Echo, Color Doppler and Cardiac Doppler  Indications:     Pre Ablation, Atrial Fibrillation  History:         Patient has prior history of Echocardiogram examinations, most recent 09/25/2020. CAD, Arrythmias:Atrial Fibrillation; Risk Factors:Sleep Apnea, Dyslipidemia, Hypertension and Diabetes.  Sonographer:     Lauraine Pilot RDCS Referring Phys:  [id number] ONEIL BROCKS SKAINS Diagnosing Phys: Oneil Parchment MD  PROCEDURE: After discussion of the risks and benefits of a TEE, an informed consent was obtained from the patient. The transesophogeal probe was passed without difficulty through the esophogus of the patient. Sedation performed by different physician. The patient was monitored while under deep sedation. Anesthestetic sedation was provided intravenously by Anesthesiology: 140mg  of Propofol , 60mg  of Lidocaine . The patient's vital signs; including heart rate, blood pressure, and oxygen saturation; remained stable throughout the procedure. The patient developed no complications during the procedure.  IMPRESSIONS   1. Left ventricular  ejection fraction, by estimation, is 60 to 65%. The left ventricle has normal function. The left ventricle has no regional wall motion abnormalities. 2. Right ventricular systolic function is normal. The right ventricular size is normal. 3. No left atrial/left atrial appendage thrombus was detected. 4. The mitral valve is normal in structure. Trivial mitral valve regurgitation. No evidence of mitral stenosis. 5. The aortic valve is normal in structure. Aortic valve regurgitation is not visualized. No aortic stenosis is present. 6. The inferior vena cava is normal in size with greater than 50% respiratory variability, suggesting right atrial pressure of 3 mmHg.  Conclusion(s)/Recommendation(s): Normal biventricular function without evidence of hemodynamically significant valvular heart disease.  FINDINGS Left Ventricle: Left ventricular ejection fraction, by estimation, is 60 to 65%. The left ventricle has normal function. The left ventricle has no regional wall motion abnormalities. The left ventricular internal cavity size was normal in size. There is no left ventricular hypertrophy.  Right Ventricle: The right ventricular size is normal. No increase in right ventricular wall thickness. Right ventricular systolic function is normal.  Left Atrium: Left atrial size was normal in size. No left atrial/left atrial appendage thrombus was detected.  Right Atrium: Right atrial size was normal in size.  Pericardium: There is no evidence of pericardial effusion.  Mitral Valve: The mitral valve is normal in structure. Trivial mitral valve regurgitation. No evidence of mitral valve stenosis.  Tricuspid Valve: The tricuspid valve is normal in structure. Tricuspid valve regurgitation is mild . No evidence of tricuspid stenosis.  Aortic Valve: The aortic valve is normal in structure. Aortic  valve regurgitation is not visualized. No aortic stenosis is present.  Pulmonic Valve: The pulmonic valve was  normal in structure. Pulmonic valve regurgitation is not visualized. No evidence of pulmonic stenosis.  Aorta: The aortic root is normal in size and structure.  Venous: The inferior vena cava is normal in size with greater than 50% respiratory variability, suggesting right atrial pressure of 3 mmHg.  IAS/Shunts: No atrial level shunt detected by color flow Doppler.  Oneil Parchment MD Electronically signed by Oneil Parchment MD Signature Date/Time: 01/09/2022/12:01:09 PM    Final        ______________________________________________________________________________________________    \  Risk Assessment/Calculations  CHA2DS2-VASc Score = 6   This indicates a 9.7% annual risk of stroke. The patient's score is based upon: CHF History: 1 HTN History: 1 Diabetes History: 1 Stroke History: 0 Vascular Disease History: 1 Age Score: 1 Gender Score: 1           Physical Exam VS:  BP 132/60   Pulse (!) 54   Ht 5' 6 (1.676 m)   Wt 224 lb 12.8 oz (102 kg)   SpO2 97%   BMI 36.28 kg/m        Wt Readings from Last 3 Encounters:  09/06/24 224 lb 12.8 oz (102 kg)  06/02/24 222 lb 6.4 oz (100.9 kg)  04/05/24 225 lb 9.6 oz (102.3 kg)    GEN: Well nourished, well developed in no acute distress. Sitting comfortably in the chair  NECK: No JVD  CARDIAC:  RRR, no murmurs, rubs, gallops. Radial pulses 2+ bilaterally  RESPIRATORY:  Clear to auscultation without rales, wheezing or rhonchi. Normal WOB on room air   ABDOMEN: Soft, non-tender, non-distended EXTREMITIES:  Trace edema in BLE; No deformity   ASSESSMENT AND PLAN  Persistent Atrial Fibrillation s/p ablation  Sinus bradycardia  - Patient previously underwent convergent ablation in 11/2022 and catheter ablation in 06/2023  - Followed by the afib clinic- when seen in 03/2024, EKG showed NSR with stable intervals  - Heart rate today 54 bpm.  Tells me that her resting heart rate is usually in the 50s.  Heart rate increases when she  exerts herself.  She denies dizziness, syncope or near syncope. -Discussed stopping metoprolol  to tartrate 12.5 mg twice daily due to low heart rate.  Patient reports that she feels fine with heart rate in the 50s.  She is concerned that adjusting her medications will increase her A-fib burden or her blood pressure.  As she is asymptomatic with heart rate in the 50s, I agree it is reasonable to continue metoprolol  -Continue metoprolol  tartrate 12.5 mg twice daily - Continue dilatizem 120 mg daily  - Continue Multaq  400 mg BID  - Continue eliquis  5 mg BID. Denies bleeding on eliquis     CAD  - Underwent intravascular lithotripsy and DES placement to the mid LAD in 03/2023  - Patient denies having chest pain that is concerning for angina.  She does have some shortness of breath on exertion, but this has been chronic. No worsening in symptoms  - Continue eliquis  5 mg BID  - continue lipitor 40 mg two times per week    HLD  - Lipid panel from 08/2023 showed LDL 125, HDL 46, triglycerides 128, total cholesterol 210  - Currently on lipitor 40 mg twice per week. Was unable to tolerate higher doses due to back pain  - Labs followed by PCP   Chronic Diastolic Heart Failure  - TEE in 01/2022 showed normal  biventricular function without evidence of hemodynamically significant valvular disease - Patient euvolemic on exam today.  Denies worsening shortness of breath.  Occasionally has mild lower extremity swelling that resolves if she elevates her feet - Continue lasix  40 mg daily. Instructed her to take an extra 40 mg of lasix  as needed for worsening lower extremity edema, weight gain  - Continue spironolactone  12.5 mg daily  - Potassium 3.8, creatinine stable 1.55 on 06/02/24   HTN  - BP well controlled  - Continue metoprolol  tartrate 12.5 mg BID, spironolactone  12.5 mg daily, diltiazem  120 mg daily and lasix  40 mg daily    Obesity  OSA  - Patient has BMI 36  - Previously had positive sleep study  for OSA, but could not tolerate multiple different types of face mask - Encouraged her to increase physical activity as tolerated and work to improve posture to help with SOB  - Patient is on ozempic , prescribed by PCP   Dispo: Follow up with afib clinic in 10/2024 as scheduled. Dr. Jeffrie in 5 months   Signed, Rollo FABIENE Louder, PA-C

## 2024-09-06 ENCOUNTER — Encounter: Payer: Self-pay | Admitting: Cardiology

## 2024-09-06 ENCOUNTER — Ambulatory Visit: Attending: Cardiology | Admitting: Cardiology

## 2024-09-06 VITALS — BP 132/60 | HR 54 | Ht 66.0 in | Wt 224.8 lb

## 2024-09-06 DIAGNOSIS — E7800 Pure hypercholesterolemia, unspecified: Secondary | ICD-10-CM | POA: Diagnosis not present

## 2024-09-06 DIAGNOSIS — I10 Essential (primary) hypertension: Secondary | ICD-10-CM

## 2024-09-06 DIAGNOSIS — I2510 Atherosclerotic heart disease of native coronary artery without angina pectoris: Secondary | ICD-10-CM | POA: Diagnosis not present

## 2024-09-06 DIAGNOSIS — E669 Obesity, unspecified: Secondary | ICD-10-CM

## 2024-09-06 DIAGNOSIS — I5032 Chronic diastolic (congestive) heart failure: Secondary | ICD-10-CM | POA: Diagnosis not present

## 2024-09-06 DIAGNOSIS — I4819 Other persistent atrial fibrillation: Secondary | ICD-10-CM

## 2024-09-06 NOTE — Patient Instructions (Signed)
 Medication Instructions:  Your physician recommends that you continue on your current medications as directed. Please refer to the Current Medication list given to you today.  *If you need a refill on your cardiac medications before your next appointment, please call your pharmacy*  Lab Work: NONE If you have labs (blood work) drawn today and your tests are completely normal, you will receive your results only by: MyChart Message (if you have MyChart) OR A paper copy in the mail If you have any lab test that is abnormal or we need to change your treatment, we will call you to review the results.  Testing/Procedures: NONE  Follow-Up: At [facility], you and your health needs are our priority.  As part of our continuing mission to provide you with exceptional heart care, our providers are all part of one team.  This team includes your primary Cardiologist (physician) and Advanced Practice Providers or APPs (Physician Assistants and Nurse Practitioners) who all work together to provide you with the care you need, when you need it.  Your next appointment:   5 month(s)  Provider:   Dr. Jeffrie  We recommend signing up for the patient portal called MyChart.  Sign up information is provided on this After Visit Summary.  MyChart is used to connect with patients for Virtual Visits (Telemedicine).  Patients are able to view lab/test results, encounter notes, upcoming appointments, etc.  Non-urgent messages can be sent to your provider as well.   To learn more about what you can do with MyChart, go to [email or web address].

## 2024-09-21 DIAGNOSIS — M8580 Other specified disorders of bone density and structure, unspecified site: Secondary | ICD-10-CM | POA: Diagnosis not present

## 2024-09-21 DIAGNOSIS — E1122 Type 2 diabetes mellitus with diabetic chronic kidney disease: Secondary | ICD-10-CM | POA: Diagnosis not present

## 2024-09-28 DIAGNOSIS — Z1331 Encounter for screening for depression: Secondary | ICD-10-CM | POA: Diagnosis not present

## 2024-09-28 DIAGNOSIS — Z0000 Encounter for general adult medical examination without abnormal findings: Secondary | ICD-10-CM | POA: Diagnosis not present

## 2024-09-28 DIAGNOSIS — E785 Hyperlipidemia, unspecified: Secondary | ICD-10-CM | POA: Diagnosis not present

## 2024-09-28 DIAGNOSIS — Z23 Encounter for immunization: Secondary | ICD-10-CM | POA: Diagnosis not present

## 2024-09-28 DIAGNOSIS — E1122 Type 2 diabetes mellitus with diabetic chronic kidney disease: Secondary | ICD-10-CM | POA: Diagnosis not present

## 2024-09-28 DIAGNOSIS — Z1339 Encounter for screening examination for other mental health and behavioral disorders: Secondary | ICD-10-CM | POA: Diagnosis not present

## 2024-10-10 ENCOUNTER — Ambulatory Visit (HOSPITAL_COMMUNITY): Admitting: Internal Medicine

## 2024-10-21 ENCOUNTER — Other Ambulatory Visit: Payer: Self-pay | Admitting: Cardiology

## 2024-10-21 DIAGNOSIS — M79671 Pain in right foot: Secondary | ICD-10-CM | POA: Diagnosis not present

## 2024-10-27 DIAGNOSIS — M19071 Primary osteoarthritis, right ankle and foot: Secondary | ICD-10-CM | POA: Diagnosis not present

## 2024-10-31 ENCOUNTER — Telehealth: Payer: Self-pay | Admitting: Cardiology

## 2024-10-31 DIAGNOSIS — I480 Paroxysmal atrial fibrillation: Secondary | ICD-10-CM

## 2024-10-31 NOTE — Telephone Encounter (Signed)
" °*  STAT* If patient is at the pharmacy, call can be transferred to refill team.   1. Which medications need to be refilled? (please list name of each medication and dose if known) ELIQUIS  5 MG TABS tablet    4. Which pharmacy/location (including street and city if local pharmacy) is medication to be sent to?  [id number] - [place], [place] - [address]     5. Do they need a 30 day or 90 day supply? 90   Pt states she is completely out   "

## 2024-11-01 MED ORDER — APIXABAN 5 MG PO TABS: 5.0000 mg | ORAL_TABLET | Freq: Two times a day (BID) | ORAL | 1 refills | Status: AC

## 2024-11-01 NOTE — Telephone Encounter (Signed)
 Pt last saw Rollo Louder, [place] on 09/06/24, last labs 06/02/24 Creat 1.55, age 73, weight 102kg, based on specified criteria pt is on appropriate dosage of Eliquis  5mg  BID for afib.  Will refill rx.

## 2024-12-12 ENCOUNTER — Other Ambulatory Visit: Payer: Self-pay | Admitting: Cardiology
